# Patient Record
Sex: Female | Born: 1960
Health system: Southern US, Community
[De-identification: ages and names within clinical notes are randomized; demographics above are authoritative.]

## PROBLEM LIST (undated history)

## (undated) DIAGNOSIS — M25475 Effusion, left foot: Secondary | ICD-10-CM

## (undated) DIAGNOSIS — M1990 Unspecified osteoarthritis, unspecified site: Secondary | ICD-10-CM

## (undated) DIAGNOSIS — R0602 Shortness of breath: Secondary | ICD-10-CM

## (undated) DIAGNOSIS — R5383 Other fatigue: Secondary | ICD-10-CM

## (undated) DIAGNOSIS — I10 Essential (primary) hypertension: Secondary | ICD-10-CM

## (undated) DIAGNOSIS — M25474 Effusion, right foot: Secondary | ICD-10-CM

## (undated) DIAGNOSIS — I6529 Occlusion and stenosis of unspecified carotid artery: Secondary | ICD-10-CM

## (undated) DIAGNOSIS — M25472 Effusion, left ankle: Secondary | ICD-10-CM

## (undated) DIAGNOSIS — E039 Hypothyroidism, unspecified: Secondary | ICD-10-CM

## (undated) DIAGNOSIS — J189 Pneumonia, unspecified organism: Secondary | ICD-10-CM

## (undated) DIAGNOSIS — Z972 Presence of dental prosthetic device (complete) (partial): Secondary | ICD-10-CM

## (undated) DIAGNOSIS — M549 Dorsalgia, unspecified: Secondary | ICD-10-CM

## (undated) DIAGNOSIS — M25471 Effusion, right ankle: Secondary | ICD-10-CM

## (undated) DIAGNOSIS — M25561 Pain in right knee: Secondary | ICD-10-CM

## (undated) DIAGNOSIS — Z98811 Dental restoration status: Secondary | ICD-10-CM

## (undated) DIAGNOSIS — I639 Cerebral infarction, unspecified: Secondary | ICD-10-CM

## (undated) DIAGNOSIS — K219 Gastro-esophageal reflux disease without esophagitis: Secondary | ICD-10-CM

## (undated) DIAGNOSIS — M25562 Pain in left knee: Secondary | ICD-10-CM

## (undated) DIAGNOSIS — M25569 Pain in unspecified knee: Secondary | ICD-10-CM

## (undated) DIAGNOSIS — N393 Stress incontinence (female) (male): Secondary | ICD-10-CM

## (undated) HISTORY — DX: Other fatigue: R53.83

## (undated) HISTORY — DX: Essential (primary) hypertension: I10

## (undated) HISTORY — DX: Pain in unspecified knee: M25.569

## (undated) HISTORY — DX: Unspecified osteoarthritis, unspecified site: M19.90

## (undated) HISTORY — DX: Occlusion and stenosis of unspecified carotid artery: I65.29

## (undated) HISTORY — DX: Effusion, left foot: M25.475

## (undated) HISTORY — PX: CAROTID ENDARTERECTOMY: SUR193

## (undated) HISTORY — PX: ABDOMINAL HYSTERECTOMY: SHX81

## (undated) HISTORY — DX: Shortness of breath: R06.02

## (undated) HISTORY — DX: Effusion, right ankle: M25.471

## (undated) HISTORY — DX: Dorsalgia, unspecified: M54.9

## (undated) HISTORY — DX: Effusion, right foot: M25.474

## (undated) HISTORY — DX: Effusion, left ankle: M25.472

---

## 1998-05-08 ENCOUNTER — Other Ambulatory Visit: Admission: RE | Admit: 1998-05-08 | Discharge: 1998-05-08 | Payer: Self-pay | Admitting: Obstetrics and Gynecology

## 2000-03-03 ENCOUNTER — Other Ambulatory Visit: Admission: RE | Admit: 2000-03-03 | Discharge: 2000-03-03 | Payer: Self-pay | Admitting: Obstetrics and Gynecology

## 2000-07-24 ENCOUNTER — Emergency Department (HOSPITAL_COMMUNITY): Admission: EM | Admit: 2000-07-24 | Discharge: 2000-07-24 | Payer: Self-pay | Admitting: *Deleted

## 2000-10-15 ENCOUNTER — Emergency Department (HOSPITAL_COMMUNITY): Admission: EM | Admit: 2000-10-15 | Discharge: 2000-10-15 | Payer: Self-pay | Admitting: Emergency Medicine

## 2001-02-09 ENCOUNTER — Other Ambulatory Visit: Admission: RE | Admit: 2001-02-09 | Discharge: 2001-02-09 | Payer: Self-pay | Admitting: Obstetrics and Gynecology

## 2002-04-15 ENCOUNTER — Other Ambulatory Visit: Admission: RE | Admit: 2002-04-15 | Discharge: 2002-04-15 | Payer: Self-pay | Admitting: Obstetrics and Gynecology

## 2002-09-07 ENCOUNTER — Encounter (INDEPENDENT_AMBULATORY_CARE_PROVIDER_SITE_OTHER): Payer: Self-pay | Admitting: Specialist

## 2002-09-07 ENCOUNTER — Inpatient Hospital Stay (HOSPITAL_COMMUNITY): Admission: AD | Admit: 2002-09-07 | Discharge: 2002-09-10 | Payer: Self-pay | Admitting: Obstetrics and Gynecology

## 2002-09-07 HISTORY — PX: PARTIAL HYSTERECTOMY: SHX80

## 2003-12-02 ENCOUNTER — Emergency Department (HOSPITAL_COMMUNITY): Admission: EM | Admit: 2003-12-02 | Discharge: 2003-12-02 | Payer: Self-pay | Admitting: Emergency Medicine

## 2004-01-24 ENCOUNTER — Ambulatory Visit (HOSPITAL_COMMUNITY): Admission: RE | Admit: 2004-01-24 | Discharge: 2004-01-24 | Payer: Self-pay | Admitting: Family Medicine

## 2004-01-30 ENCOUNTER — Ambulatory Visit (HOSPITAL_COMMUNITY): Admission: RE | Admit: 2004-01-30 | Discharge: 2004-01-30 | Payer: Self-pay | Admitting: Family Medicine

## 2004-02-29 ENCOUNTER — Encounter (HOSPITAL_COMMUNITY): Admission: RE | Admit: 2004-02-29 | Discharge: 2004-03-01 | Payer: Self-pay | Admitting: Endocrinology

## 2004-03-01 ENCOUNTER — Other Ambulatory Visit: Admission: RE | Admit: 2004-03-01 | Discharge: 2004-03-01 | Payer: Self-pay | Admitting: Obstetrics and Gynecology

## 2004-03-26 ENCOUNTER — Ambulatory Visit (HOSPITAL_COMMUNITY): Admission: RE | Admit: 2004-03-26 | Discharge: 2004-03-26 | Payer: Self-pay | Admitting: Internal Medicine

## 2004-04-05 ENCOUNTER — Ambulatory Visit (HOSPITAL_COMMUNITY): Admission: RE | Admit: 2004-04-05 | Discharge: 2004-04-05 | Payer: Self-pay | Admitting: Internal Medicine

## 2004-08-12 ENCOUNTER — Emergency Department (HOSPITAL_COMMUNITY): Admission: EM | Admit: 2004-08-12 | Discharge: 2004-08-12 | Payer: Self-pay | Admitting: *Deleted

## 2004-11-19 ENCOUNTER — Emergency Department (HOSPITAL_COMMUNITY): Admission: EM | Admit: 2004-11-19 | Discharge: 2004-11-19 | Payer: Self-pay | Admitting: Emergency Medicine

## 2004-12-11 ENCOUNTER — Encounter (HOSPITAL_COMMUNITY): Admission: RE | Admit: 2004-12-11 | Discharge: 2004-12-12 | Payer: Self-pay | Admitting: Internal Medicine

## 2004-12-18 ENCOUNTER — Emergency Department (HOSPITAL_COMMUNITY): Admission: EM | Admit: 2004-12-18 | Discharge: 2004-12-18 | Payer: Self-pay | Admitting: Family Medicine

## 2005-12-26 ENCOUNTER — Encounter (HOSPITAL_COMMUNITY): Admission: RE | Admit: 2005-12-26 | Discharge: 2006-01-06 | Payer: Self-pay | Admitting: Endocrinology

## 2006-01-21 ENCOUNTER — Emergency Department (HOSPITAL_COMMUNITY): Admission: EM | Admit: 2006-01-21 | Discharge: 2006-01-21 | Payer: Self-pay | Admitting: Emergency Medicine

## 2006-06-20 ENCOUNTER — Ambulatory Visit (HOSPITAL_COMMUNITY): Admission: RE | Admit: 2006-06-20 | Discharge: 2006-06-20 | Payer: Self-pay | Admitting: Family Medicine

## 2006-08-20 ENCOUNTER — Emergency Department (HOSPITAL_COMMUNITY): Admission: EM | Admit: 2006-08-20 | Discharge: 2006-08-20 | Payer: Self-pay | Admitting: *Deleted

## 2006-08-28 ENCOUNTER — Emergency Department (HOSPITAL_COMMUNITY): Admission: EM | Admit: 2006-08-28 | Discharge: 2006-08-28 | Payer: Self-pay | Admitting: Emergency Medicine

## 2007-02-09 ENCOUNTER — Ambulatory Visit (HOSPITAL_COMMUNITY): Admission: RE | Admit: 2007-02-09 | Discharge: 2007-02-09 | Payer: Self-pay | Admitting: Family Medicine

## 2007-03-10 ENCOUNTER — Ambulatory Visit: Payer: Self-pay | Admitting: Gastroenterology

## 2007-03-10 LAB — CONVERTED CEMR LAB
A-1 Antitrypsin, Ser: 168 mg/dL (ref 83–200)
Angiotensin 1 Converting Enzyme: 21 units/L (ref 9–67)
Anti Nuclear Antibody(ANA): NEGATIVE
Ceruloplasmin: 43 mg/dL (ref 21–63)
Ferritin: 38.9 ng/mL (ref 10.0–291.0)
HCV Ab: NEGATIVE
Hep B S Ab: NEGATIVE
Hepatitis B Surface Ag: NEGATIVE
INR: 0.9 (ref 0.8–1.0)
Iron: 52 ug/dL (ref 42–145)
Prothrombin Time: 11.1 s (ref 10.9–13.3)
Saturation Ratios: 15.2 % — ABNORMAL LOW (ref 20.0–50.0)
Transferrin: 244.8 mg/dL (ref >=212.0)

## 2007-04-29 ENCOUNTER — Ambulatory Visit: Payer: Self-pay | Admitting: Orthopedic Surgery

## 2007-04-29 DIAGNOSIS — IMO0002 Reserved for concepts with insufficient information to code with codable children: Secondary | ICD-10-CM | POA: Insufficient documentation

## 2007-04-29 DIAGNOSIS — M25569 Pain in unspecified knee: Secondary | ICD-10-CM | POA: Insufficient documentation

## 2007-05-01 ENCOUNTER — Telehealth: Payer: Self-pay | Admitting: Orthopedic Surgery

## 2007-05-05 ENCOUNTER — Ambulatory Visit (HOSPITAL_COMMUNITY): Admission: RE | Admit: 2007-05-05 | Discharge: 2007-05-05 | Payer: Self-pay | Admitting: Orthopedic Surgery

## 2007-05-13 ENCOUNTER — Ambulatory Visit: Payer: Self-pay | Admitting: Orthopedic Surgery

## 2007-07-01 ENCOUNTER — Ambulatory Visit: Payer: Self-pay | Admitting: Orthopedic Surgery

## 2007-07-01 DIAGNOSIS — M722 Plantar fascial fibromatosis: Secondary | ICD-10-CM | POA: Insufficient documentation

## 2007-10-05 ENCOUNTER — Ambulatory Visit (HOSPITAL_COMMUNITY): Admission: RE | Admit: 2007-10-05 | Discharge: 2007-10-05 | Payer: Self-pay | Admitting: *Deleted

## 2007-10-15 ENCOUNTER — Ambulatory Visit (HOSPITAL_COMMUNITY): Admission: RE | Admit: 2007-10-15 | Discharge: 2007-10-15 | Payer: Self-pay | Admitting: *Deleted

## 2007-10-28 ENCOUNTER — Encounter: Admission: RE | Admit: 2007-10-28 | Discharge: 2007-10-28 | Payer: Self-pay | Admitting: *Deleted

## 2008-01-14 ENCOUNTER — Ambulatory Visit: Payer: Self-pay | Admitting: Orthopedic Surgery

## 2008-02-04 ENCOUNTER — Encounter: Admission: RE | Admit: 2008-02-04 | Discharge: 2008-05-04 | Payer: Self-pay | Admitting: *Deleted

## 2008-02-08 HISTORY — PX: GANGLION CYST EXCISION: SHX1691

## 2008-02-18 ENCOUNTER — Ambulatory Visit (HOSPITAL_COMMUNITY): Admission: RE | Admit: 2008-02-18 | Discharge: 2008-02-19 | Payer: Self-pay | Admitting: *Deleted

## 2008-02-18 HISTORY — PX: LAPAROSCOPIC GASTRIC BANDING: SHX1100

## 2008-03-01 ENCOUNTER — Encounter (INDEPENDENT_AMBULATORY_CARE_PROVIDER_SITE_OTHER): Payer: Self-pay | Admitting: Podiatry

## 2008-03-01 ENCOUNTER — Ambulatory Visit (HOSPITAL_COMMUNITY): Admission: RE | Admit: 2008-03-01 | Discharge: 2008-03-01 | Payer: Self-pay | Admitting: Podiatry

## 2008-05-16 ENCOUNTER — Encounter: Admission: RE | Admit: 2008-05-16 | Discharge: 2008-05-16 | Payer: Self-pay | Admitting: *Deleted

## 2008-08-31 ENCOUNTER — Encounter: Admission: RE | Admit: 2008-08-31 | Discharge: 2008-08-31 | Payer: Self-pay | Admitting: *Deleted

## 2009-02-16 ENCOUNTER — Encounter (INDEPENDENT_AMBULATORY_CARE_PROVIDER_SITE_OTHER): Payer: Self-pay | Admitting: *Deleted

## 2009-02-22 ENCOUNTER — Encounter (INDEPENDENT_AMBULATORY_CARE_PROVIDER_SITE_OTHER): Payer: Self-pay | Admitting: *Deleted

## 2009-03-22 ENCOUNTER — Encounter (INDEPENDENT_AMBULATORY_CARE_PROVIDER_SITE_OTHER): Payer: Self-pay | Admitting: *Deleted

## 2009-03-23 ENCOUNTER — Ambulatory Visit: Payer: Self-pay | Admitting: Gastroenterology

## 2009-05-19 ENCOUNTER — Telehealth: Payer: Self-pay | Admitting: Gastroenterology

## 2009-05-19 ENCOUNTER — Ambulatory Visit: Payer: Self-pay | Admitting: Gastroenterology

## 2009-05-19 ENCOUNTER — Telehealth (INDEPENDENT_AMBULATORY_CARE_PROVIDER_SITE_OTHER): Payer: Self-pay | Admitting: *Deleted

## 2009-05-22 ENCOUNTER — Encounter (INDEPENDENT_AMBULATORY_CARE_PROVIDER_SITE_OTHER): Payer: Self-pay | Admitting: *Deleted

## 2009-10-25 ENCOUNTER — Telehealth: Payer: Self-pay | Admitting: Gastroenterology

## 2009-11-13 ENCOUNTER — Encounter: Admission: RE | Admit: 2009-11-13 | Discharge: 2009-11-13 | Payer: Self-pay | Admitting: Obstetrics and Gynecology

## 2010-01-27 ENCOUNTER — Encounter: Payer: Self-pay | Admitting: Obstetrics and Gynecology

## 2010-01-28 ENCOUNTER — Encounter: Payer: Self-pay | Admitting: Internal Medicine

## 2010-01-28 ENCOUNTER — Encounter: Payer: Self-pay | Admitting: Family Medicine

## 2010-01-28 ENCOUNTER — Encounter: Payer: Self-pay | Admitting: Endocrinology

## 2010-02-08 NOTE — Progress Notes (Signed)
Summary: Schedule Colonoscopy   Phone Note Outgoing Call Call back at Home Phone [phone]   Call placed by: Harlow Mares CMA Duncan Dull),  October 25, 2009 2:41 PM Call placed to: Patient Summary of Call: Left a message on patients machine to call back, she noshowed her last previsit and so her colonoscopy was cxed. She is still due for her colonoscopy Initial call taken by: Harlow Mares CMA Duncan Dull),  October 25, 2009 2:42 PM  Follow-up for Phone Call        Left a message on the patient machine to call back and schedule a previsit and procedure with our office. A letter will be mailed to the patient.   Follow-up by: Harlow Mares CMA (AAMA),  November 07, 2009 1:24 PM

## 2010-02-08 NOTE — Letter (Signed)
Summary: [facility] No Show Letter  [facility]  [address] [place], [place] [address]   Phone: [phone]  Fax: [phone]      May 22, 2009 MRN: [id number]   Angela Abbott [address] [place], [place]  [address]      You were scheduled for an endoscopic procedure with Dr. Russella Dar on 05/19/2009 at the [facility] but you did not keep the appointment.    Your provider recommended this procedure for the benefit of your health.  It is very important that you reschedule it.  Failure to do so may be to the detriment of your health.  Please call us at [phone] and we will be happy to assist you with rescheduling.    If you were referred for this procedure by another physician/provider, we will notify him/her that you did not keep your appointment.   Sincerely,  Threasa Beards, RN  [facility]  Appended Document: [facility] No Show Letter letter mailed

## 2010-02-08 NOTE — Letter (Signed)
Summary: [facility] Instructions  [facility]  [address] [place], [place] [address]   Phone: [phone]  Fax: [phone]       Angela Abbott    September 06, 1960    MRN: [id number]        Procedure Day /Date:  Friday 04/07/2009     Arrival Time: 12:30 pm      Procedure Time: 1:30 pm     Location of Procedure:                    _ x_  [facility] (4th Floor)                        PREPARATION FOR COLONOSCOPY WITH MOVIPREP   Starting 5 days prior to your procedure Sunday 3/27 do not eat nuts, seeds, popcorn, corn, beans, peas,  salads, or any raw vegetables.  Do not take any fiber supplements (e.g. Metamucil, Citrucel, and Benefiber).  THE DAY BEFORE YOUR PROCEDURE         DATE: Thursday 3/31  1.  Drink clear liquids the entire day-NO SOLID FOOD  2.  Do not drink anything colored red or purple.  Avoid juices with pulp.  No orange juice.  3.  Drink at least 64 oz. (8 glasses) of fluid/clear liquids during the day to prevent dehydration and help the prep work efficiently.  CLEAR LIQUIDS INCLUDE: Water Jello Ice Popsicles Tea (sugar ok, no milk/cream) Powdered fruit flavored drinks Coffee (sugar ok, no milk/cream) Gatorade Juice: apple, white grape, white cranberry  Lemonade Clear bullion, consomm, broth Carbonated beverages (any kind) Strained chicken noodle soup Hard Candy                             4.  In the morning, mix first dose of MoviPrep solution:    Empty 1 Pouch A and 1 Pouch B into the disposable container    Add lukewarm drinking water to the top line of the container. Mix to dissolve    Refrigerate (mixed solution should be used within 24 hrs)  5.  Begin drinking the prep at 5:00 p.m. The MoviPrep container is divided by 4 marks.   Every 15 minutes drink the solution down to the next mark (approximately 8 oz) until the full liter is complete.   6.  Follow completed prep with 16 oz of clear liquid of your choice (Nothing  red or purple).  Continue to drink clear liquids until bedtime.  7.  Before going to bed, mix second dose of MoviPrep solution:    Empty 1 Pouch A and 1 Pouch B into the disposable container    Add lukewarm drinking water to the top line of the container. Mix to dissolve    Refrigerate  THE DAY OF YOUR PROCEDURE      DATE: Friday 4/1  Beginning at 8:30 a.m. (5 hours before procedure):         1. Every 15 minutes, drink the solution down to the next mark (approx 8 oz) until the full liter is complete.  2. Follow completed prep with 16 oz. of clear liquid of your choice.    3. You may drink clear liquids until 11:30 am (2 HOURS BEFORE PROCEDURE).   MEDICATION INSTRUCTIONS  Unless otherwise instructed, you should take regular prescription medications with a small sip of water   as early as possible the morning of  your procedure.         OTHER INSTRUCTIONS  You will need a responsible adult at least 51 years of age to accompany you and drive you home.   This person must remain in the waiting room during your procedure.  Wear loose fitting clothing that is easily removed.  Leave jewelry and other valuables at home.  However, you may wish to bring a book to read or  an iPod/MP3 player to listen to music as you wait for your procedure to start.  Remove all body piercing jewelry and leave at home.  Total time from sign-in until discharge is approximately 2-3 hours.  You should go home directly after your procedure and rest.  You can resume normal activities the  day after your procedure.  The day of your procedure you should not:   Drive   Make legal decisions   Operate machinery   Drink alcohol   Return to work  You will receive specific instructions about eating, activities and medications before you leave.    The above instructions have been reviewed and explained to me by   Ezra Sites RN  March 23, 2009 8:15 AM     I fully understand and can verbalize  these instructions _____________________________ Date _________

## 2010-02-08 NOTE — Letter (Signed)
Summary: Previsit letter  [facility]  [address] [place], [place] [address]   Phone: [phone]  Fax: [phone]       02/22/2009 MRN: [id number]  Angela Abbott [address] [place], [place]  [address]  Dear Ms. Wierman,  Welcome to the Gastroenterology Division at [facility].    You are scheduled to see a nurse for your pre-procedure visit on 03/23/2009 at 8:00AM on the 3rd floor at [facility], [address]  We ask that you try to arrive at our office 15 minutes prior to your appointment time to allow for check-in.  Your nurse visit will consist of discussing your medical and surgical history, your immediate family medical history, and your medications.    Please bring a complete list of all your medications or, if you prefer, bring the medication bottles and we will list them.  We will need to be aware of both prescribed and over the counter drugs.  We will need to know exact dosage information as well.  If you are on blood thinners (Coumadin, Plavix, Aggrenox, Ticlid, etc.) please call our office today/prior to your appointment, as we need to consult with your physician about holding your medication.   Please be prepared to read and sign documents such as consent forms, a financial agreement, and acknowledgement forms.  If necessary, and with your consent, a friend or relative is welcome to sit-in on the nurse visit with you.  Please bring your insurance card so that we may make a copy of it.  If your insurance requires a referral to see a specialist, please bring your referral form from your primary care physician.  No co-pay is required for this nurse visit.     If you cannot keep your appointment, please call [phone] to cancel or reschedule prior to your appointment date.  This allows [place] the opportunity to schedule an appointment for another patient in need of care.    Thank you for choosing [facility] for your medical needs.   We appreciate the opportunity to care for you.  Please visit [place] at our website  to learn more about our practice.                     Sincerely.                                                                                                                   The Gastroenterology Division

## 2010-02-08 NOTE — Progress Notes (Signed)
   Phone Note Outgoing Call   Call placed by: Laverna Peace RN,  May 19, 2009 3:28 PM Summary of Call: Called pt concerning missed appointment today. No answer.

## 2010-02-08 NOTE — Progress Notes (Signed)
Summary: No Show for Colon 05-19-09   Phone Note Outgoing Call   Call placed by: Alden Hipp,  May 19, 2009 1:24 PM Summary of Call: Called pt concerning missed appt today. No answer, LM. Do you want to charge patient NO SHOW fee?  Follow-up for Phone Call        placed call to pt for missed appointment. No answer.  Follow-up by: Laverna Peace RN,  May 19, 2009 3:26 PM  Additional Follow-up for Phone Call Additional follow up Details #1::        Yes Additional Follow-up by: Meryl Dare MD FACG,  May 29, 2009 4:32 PM    Additional Follow-up for Phone Call Additional follow up Details #2::    Patient BILLED Procedure No Show fee. Follow-up by: Leanor Kail [facility],  May 30, 2009 8:32 AM

## 2010-02-08 NOTE — Miscellaneous (Signed)
Summary: [facility] PV  Clinical Lists Changes  Medications: Added new medication of MOVIPREP 100 GM  SOLR (PEG-KCL-NACL-NASULF-NA ASC-C) As per prep instructions. - Signed Rx of MOVIPREP 100 GM  SOLR (PEG-KCL-NACL-NASULF-NA ASC-C) As per prep instructions.;  #1 x 0;  Signed;  Entered by: Ezra Sites RN;  Authorized by: Meryl Dare MD [facility];  Method used: Electronically to [facility]. [address] [phone]*, [address] [place], [place]  [address], Ph: [phone], Fax: [phone] Observations: Added new observation of NKA: T (03/23/2009 7:52)    Prescriptions: MOVIPREP 100 GM  SOLR (PEG-KCL-NACL-NASULF-NA ASC-C) As per prep instructions.  #1 x 0   Entered by:   Ezra Sites RN   Authorized by:   Meryl Dare MD [facility]   Signed by:   Ezra Sites RN on 03/23/2009   Method used:   Electronically to        [facility]. [address] [phone]* (retail)       [address] [place]  [address]       Ph: [phone]       Fax: [phone]   RxID:   [id number]

## 2010-02-08 NOTE — Letter (Signed)
Summary: Colonoscopy Letter  [facility]  [address] [place], [place] [address]   Phone: [phone]  Fax: [phone]      February 16, 2009 MRN: [id number]   Angela Abbott [address] [place], [place]  [address]   Dear [name],   According to your medical record, it is time for you to schedule a Colonoscopy. The American Cancer Society recommends this procedure as a method to detect early colon cancer. Patients with a family history of colon cancer, or a personal history of colon polyps or inflammatory bowel disease are at increased risk.  This letter has beeen generated based on the recommendations made at the time of your procedure. If you feel that in your particular situation this may no longer apply, please contact our office.  Please call our office at [phone] to schedule this appointment or to update your records at your earliest convenience.  Thank you for cooperating with [place] to provide you with the very best care possible.   Sincerely,  Judie Petit T. Russella Dar, M.D.  [facility] [phone]

## 2010-02-08 NOTE — Progress Notes (Signed)
   Phone Note Outgoing Call   Call placed by: Laverna Peace RN,  May 19, 2009 5:13 PM Summary of Call: Called pt concerning missed appointment.  No anwser.

## 2010-04-20 ENCOUNTER — Encounter: Payer: Self-pay | Admitting: Orthopedic Surgery

## 2010-04-24 LAB — DIFFERENTIAL
Basophils Absolute: 0 10*3/uL (ref 0.0–0.1)
Basophils Absolute: 0 10*3/uL (ref 0.0–0.1)
Basophils Relative: 0 % (ref 0–1)
Eosinophils Absolute: 0.1 10*3/uL (ref 0.0–0.7)
Eosinophils Relative: 1 % (ref 0–5)
Lymphocytes Relative: 12 % (ref 12–46)
Lymphocytes Relative: 27 % (ref 12–46)
Neutro Abs: 7.9 10*3/uL — ABNORMAL HIGH (ref 1.7–7.7)
Neutrophils Relative %: 64 % (ref 43–77)
Neutrophils Relative %: 82 % — ABNORMAL HIGH (ref 43–77)

## 2010-04-24 LAB — COMPREHENSIVE METABOLIC PANEL
ALT: 31 U/L (ref 0–35)
AST: 20 U/L (ref 0–37)
CO2: 31 mEq/L (ref 19–32)
Chloride: 102 mEq/L (ref 96–112)
Creatinine, Ser: 0.85 mg/dL (ref 0.4–1.2)
GFR calc Af Amer: >60 mL/min (ref >60)
GFR calc non Af Amer: >60 mL/min (ref >60)
Glucose, Bld: 92 mg/dL (ref 70–99)
Total Bilirubin: 0.8 mg/dL (ref 0.3–1.2)

## 2010-04-24 LAB — CBC
Hemoglobin: 13.1 g/dL (ref 12.0–15.0)
MCHC: 34.3 g/dL (ref 30.0–36.0)
MCHC: 34.9 g/dL (ref 30.0–36.0)
MCV: 87.7 fL (ref 78.0–100.0)
Platelets: 259 10*3/uL (ref 150–400)
RBC: 4.38 MIL/uL (ref 3.87–5.11)
RDW: 14.8 % (ref 11.5–15.5)
WBC: 8.8 10*3/uL (ref 4.0–10.5)

## 2010-04-24 LAB — HEMOGLOBIN AND HEMATOCRIT, BLOOD
HCT: 40.5 % (ref 36.0–46.0)
Hemoglobin: 13.9 g/dL (ref 12.0–15.0)

## 2010-05-08 ENCOUNTER — Ambulatory Visit: Payer: Self-pay | Admitting: Orthopedic Surgery

## 2010-05-22 NOTE — H&P (Signed)
NAME:  [name], [name]              ACCOUNT NO.:  [id number]   MEDICAL RECORD NO.:  [id number]          PATIENT TYPE:  AMB   LOCATION:  DAY                           FACILITY:  [facility]   PHYSICIAN:  Denny Peon. Ulice Brilliant, D.P.M.  DATE OF BIRTH:  05-16-1960   DATE OF ADMISSION:  DATE OF DISCHARGE:  LH                              HISTORY & PHYSICAL   Angela Abbott who is scheduled for surgery tomorrow.   HISTORY OF PRESENT ILLNESS:  Angela Abbott has a painful ganglionic-  appearing cyst on the dorsolateral aspect of the right foot.  This has  been present for about 6 months.  This was aspirated once previously,  and it recurred within a matter of 1 week following aspiration.  [name] relates it is getting progressively more painful in shoe gear.   PAST MEDICAL HISTORY:  Significant previously for post lap band surgery  done about 10 days ago.  She is doing very well at this point.  Her past  medical history otherwise is relatively unremarkable.  She has a history  of arthritis.  She also has a history of low thyroid and some esophageal  reflux issues for which she takes Synthroid and is on Protonix.   She has no known drug allergies.   There is a history of diabetes and heart disease in her family.   She relates that she smokes a quarter pack a day.  She drinks  occasionally.  Objectively, she has a soft tissue mass, which is  fluctuant on the dorsolateral aspect of the right foot, near the base of  the fourth and fifth metatarsal cuboid joint.   ASSESSMENT:  Ganglionic cyst, recurrent.   PLAN:  Surgical corrections will consist of a ganglionic cyst excision,  this will be done under monitored anesthesia care at [facility].  I have discussed the procedure with her.  I have discussed  the possibility of a postoperative infection.  I have called Dr.  Marcos Eke to see if previous lap band surgery done within the last 2  weeks would be a contraindication for his anesthesia and he  cannot think  of a reason.  We described the procedure to Angela Abbott.  She has read  this, apparently understood and signed.  I have discussed with her there  is even with surgery a possibility of recurrence.      Denny Peon. Ulice Brilliant, D.P.M.  Electronically Signed    CMD/MEDQ  D:  02/29/2008  T:  03/01/2008  Job:  [id number]

## 2010-05-22 NOTE — Assessment & Plan Note (Signed)
[facility]                         GASTROENTEROLOGY OFFICE NOTE   Angela Abbott, Angela Abbott                     MRN:          [id number]  DATE:03/10/2007                            DOB:          January 13, 1960    REFERRING PHYSICIAN:  Corrie Mckusick, M.D.   REASON FOR CONSULTATION:  Elevated liver function tests, epigastric pain  and an abnormal liver ultrasound.   HISTORY OF PRESENT ILLNESS:  Angela Abbott is a 50 year old white female  who was recently found to have an elevated ALT at 107 and a gamma GT at  20. The remainder of her liver tests were normal. Abdominal ultrasound  imaging performed at [facility] on February 09, 2007, showed  probable fatty infiltration of the liver and incomplete pancreatic  visualization. She relates epigastric pain and substernal burning  associated with nausea for the past month. These symptoms clearly worsen  at night. She was given a trial of Protonix for 7 to 10 days and her  symptoms substantially improved. She has no vomiting, hematemesis,  change in bowel habits, melena, hematochezia, dysphagia, or odynophagia.  She states she had a colonoscopy at our office about 14 years ago for  evaluation of irritable bowel syndrome. Her old chart is not available  at the time of this dictation. She denies any prior history of liver  disease, blood transfusions, jaundice, hepatitis, or intravenous drug  usage.   PAST MEDICAL HISTORY:  1. Irritable bowel syndrome.  2. Goiter, status post radioactive ablation.  3. Hypothyroidism.  4. Obesity.   PAST SURGICAL HISTORY:  1. Status post Cesarean section x2 in 1995 and 1998.  2. Status post hysterectomy in 2004.   CURRENT MEDICATIONS:  1. Synthroid 150 mcg daily.   ALLERGIES:  NO KNOWN DRUG ALLERGIES.   SOCIAL HISTORY:  Per the handwritten form.   REVIEW OF SYSTEMS:  Per the handwritten form.   PHYSICAL EXAMINATION:  GENERAL:  Obese white female. No acute  distress.  VITAL SIGNS:  Height 5 feet 7 inches. Weight 285.2 pounds. Blood  pressure 110/88, pulse 72 and regular.  HEENT:  Anicteric sclerae. Oropharynx clear.  CHEST:  Clear to auscultation bilaterally.  CARDIOVASCULAR:  Regular rate and rhythm. No murmurs.  ABDOMEN:  Soft, nontender, and nondistended. Normal active bowel sounds.  No palpable organomegaly, masses, or hernias. The liver span is  approximately 12 to 13 cm by percussion and scratch testing in the right  upper quadrant.   ASSESSMENT/PLAN:  1. Elevated transaminases with probable fatty infiltration of the      liver on ultrasound. I suspect this is hepatic steatosis. Will      exclude other viral and metabolic liver diseases. Obtain standard      blood work. She should also have a fasting lipid panel performed at      her primary care physician's office to evaluate for other causes of      fatty liver. The most likely cause is obesity and a long term      weight loss program supervised by her primary care physician is      recommended.  2. Reflux symptoms and epigastric pain.  Suspected GERD. Need to      exclude ulcer disease, gastritis and other disorders. Resume      Protonix 40 mg p.o. q. a.m., along with standard anti-reflux      measures. Return office visit in 4 to 6 weeks. If her symptoms have      not come under complete control, proceed with endoscopy for further      evaluation.  3. Colorectal cancer screening. Average risk. Begin screening at age      23.     Venita Lick. Russella Dar, MD, [facility]  Electronically Signed    MTS/MedQ  DD: 03/10/2007  DT: 03/10/2007  Job #: [id number]   cc:   Corrie Mckusick, M.D.

## 2010-05-22 NOTE — Op Note (Signed)
NAME:  Angela, Abbott              ACCOUNT NO.:  [id number]   MEDICAL RECORD NO.:  [id number]          PATIENT TYPE:  AMB   LOCATION:  DAY                           FACILITY:  [facility]   PHYSICIAN:  Denny Peon. Ulice Brilliant, D.P.M.  DATE OF BIRTH:  02/01/1960   DATE OF PROCEDURE:  DATE OF DISCHARGE:                               OPERATIVE REPORT   PREOPERATIVE DIAGNOSIS:  Ganglionic cyst, right foot.   POSTOPERATIVE DIAGNOSIS:  Ganglionic cyst, right foot.   PROCEDURE PERFORMED:  Excision of ganglionic cyst, right foot.   SURGEON:  Denny Peon. Ulice Brilliant, D.P.M.   ANESTHESIA:  MAC.   INDICATIONS FOR SURGERY:  A 103-month to 1-year history of a painful cyst  on the lateral aspect of her right foot.  This has been aspirated  previously which evacuated the contents only for about 3 days only to  fill back up again.  The patient relates constant discomfort and  irritation with enclosed shoe gear.  Clinically, the patient is noted to  have an enlarged ganglionic cyst on the dorsal lateral aspect of the  right foot about at the fourth-fifth met-cuboid articulation.   OPERATIVE FINDINGS:  Enlarged ganglionic cyst arising from the fourth  fifth metatarsal cuboid articulation the procedure well.   DESCRIPTION OF PROCEDURE:  Angela Abbott is brought in to the OR and placed  on the table in a supine position.  IV sedation was established.  A  block was performed in a V-shaped fashion proximal to the planned  incisional site.  A pneumatic ankle tourniquet was applied across her  right ankle.  Her foot was prepped and draped in the usual aseptic  fashion.  An Ace bandage was utilized to exsanguinate her foot.  The  tourniquet was inflated to 250 mmHg.   PROCEDURE:  Excision of ganglionic cyst, dorsal lateral aspect, right  foot.  Attention was directed to this dorsal lateral aspect of the right  foot.  A curvilinear skin incision was planned and then carried forth  with #15 blade.  The incision is deepened through  skin and subcutaneous  tissue via sharp and blunt dissection.  The ganglionic cyst was very  readily encountered.  It was then freed of all soft tissue adherence,  although during this the cyst was punctured and its contents were  expressed out which for typical, this gives thick gelatinous appearing  fluid.  The cyst was removed and traced to the fourth fifth metatarsal  cuboid articulation.  The wound is flushed with copious amounts of  irrigants.  This defect is closed with 3-0 Vicryl.  Deep fascia was then  further closed with 3-0 and 4-0 Vicryl.  Subcutaneous tissues and skin  were closed with a combination of running horizontal mattress sutures  and then the skin and subcuticular closure.  Steri-Strips were applied  across the wound.  A postoperative injection of Marcaine and Hexadrol  was dispensed.  A Betadine-soaked Adaptic dressing and a dry sterile  compressive dressing follows.  The specimen will be sent to pathology  labelled ganglionic cyst.   Angela Abbott tolerated the incision procedure well.  She is transported to  [facility] without incident.  While there, a list of written  instructions were explained to her.  A prescription for Lorcet Plus and  Phenergan is dispensed.  She will be seen in 7 days for her first postop  visit.      Denny Peon. Ulice Brilliant, D.P.M.  Electronically Signed     CMD/MEDQ  D:  03/02/2008  T:  03/02/2008  Job:  [id number]

## 2010-05-22 NOTE — Op Note (Signed)
NAME:  Angela Abbott, Angela Abbott              ACCOUNT NO.:  [id number]   MEDICAL RECORD NO.:  [id number]          PATIENT TYPE:  OIB   LOCATION:  1531                         FACILITY:  [facility]   PHYSICIAN:  Alfonse Ras, MD   DATE OF BIRTH:  1960/09/26   DATE OF PROCEDURE:  02/18/2008  DATE OF DISCHARGE:                               OPERATIVE REPORT   PREOPERATIVE DIAGNOSIS:  Medically refractory morbid obesity with a BMI  of 44.   POSTOPERATIVE DIAGNOSIS:  Medically refractory morbid obesity with a BMI  of 44, no evidence of hiatal hernia.   PROCEDURE:  Laparoscopic adjustable gastric banding with an 8 Allergan,  APS system, and placement of subcutaneous port.   ASSISTANT:  Thornton Park.  Martin   ANESTHESIA:  General.   DESCRIPTION:  The patient was taken to the operating room, after  extensive informed consent was granted, she was placed in supine  position.  Abdomen was prepped and draped in normal sterile fashion.  Using an 11 mm OptiView trocar in the left upper quadrant, peritoneal  access was obtained.  Pneumoperitoneum was obtained.  A 15 mm trocar was  placed through the falciform ligament in the right upper quadrant, and  an additional 11 mm trocar was placed in the right upper quadrant.  An  11 mm trocar was placed in the right supraumbilical paramedian position.  This allowed good visualization and pneumoperitoneum and placement of  the [facility] liver retractor.  The left lateral segment of the liver  was retracted with a Hotel manager.  The angle of His was  dissected both sharply and then bluntly using the band passer.  Area on  the lesser curve was identified and the pars flaccida was opened using  Bovie electrocautery.  The sizing balloon was placed down through the  esophagus and into the stomach and insufflated with 15 mL of air.  It  was pulled back against the hiatus, and there was no evidence of hiatal  hernia.  It was then pulled back.  After adequate  dissection was made  using a pars flaccida technique, the band passer was then placed in a  retrogastric position and brought out at the angle of His.  An APS band  was then placed and brought around in a retrogastric position without  difficulty.  It was snapped in place.  Sizing balloon placed down in the  stomach and the band moved easily.  An anterior fundoplication was  performed with interrupted 3-0 Ethibond sutures.  I put an antislip  suture and placed this well with the 3-0 Ethibond suture.  The tubing  was brought out through the 11 mm port in right upper quadrant and  attached to the port.  Nathanson liver retractor was removed after  adequate hemostasis was ensured.  Pneumoperitoneum was released.  All  ports were removed.  The port that was attached to the tubing then had a  fix atrium mesh with interrupted 2-0 Prolene sutures through the holes  in the port.  This was then placed in subcutaneous position without  difficulty.  Incisions were closed with  subcuticular 4-0  Vicryl sutures.  Steri-Strips and sterile dressings were applied.  All  incisions were injected with 0.5 Marcaine.  Steri-Strips and sterile  dressings were applied.  The patient tolerated the procedure well and  went to PACU in good condition.      Alfonse Ras, MD  Electronically Signed     KRE/MEDQ  D:  02/18/2008  T:  02/18/2008  Job:  [id number]

## 2010-05-25 NOTE — H&P (Signed)
NAME:  Angela Abbott, Angela Abbott                        ACCOUNT NO.:  [id number]   MEDICAL RECORD NO.:  [id number]                   PATIENT TYPE:  INP   LOCATION:  NA                                   FACILITY:  [facility]   PHYSICIAN:  Malva Limes, M.D.                 DATE OF BIRTH:  14-May-1960   DATE OF ADMISSION:  09/06/2002  DATE OF DISCHARGE:                                HISTORY & PHYSICAL   HISTORY OF PRESENT ILLNESS:  Angela Abbott is a 50 year old white female G3, P2-  0-1-2, who presents to [facility] for total abdominal hysterectomy  secondary to a several year history of worsening menometorrhagia and pelvic  pressure. The patient has had irregular cycles the majority of her life. She  required Clomid to get pregnant during her second pregnancy. She also was on  oral contraceptive pills to regulate her cycles and for birth control until  67, when she had to stop because of her smoking. At that point, she was put  on Depo-Provera. The patient continued on Depo-Provera until recently when  she continued to have bleeding on a regular basis. The patient underwent an  examination and her uterus was felt to be enlarged. An ultrasound was  obtained. No fibroids were identified. However, it was felt that the patient  likely had adenomyosis from the ultrasound findings. The patient has normal  thyroid functions. She had several options discussed with her. Those  included manipulating her hormone therapy, endometrial ablation and  hysterectomy. The patient expressed a strong desire to proceed with  definitive therapy and therefore, will undergo a hysterectomy today.   ALLERGIES:  CODEINE.   PAST MEDICAL HISTORY:  She has had 2 cesarean sections and a tubal ligation.  She has also had a D&C.   SOCIAL HISTORY:  The patient currently smokes 1 pack per day. She drinks  alcohol occasionally. She denies drug use.   CURRENT MEDICATIONS:  Include Depo-Provera.   FAMILY HISTORY:  Significant  for hypertension, diabetes, and Down's syndrome  in a sister.   PHYSICAL EXAMINATION:  GENERAL: The patient is an overweight white female in  no apparent distress.  HEENT: Within normal limits.  LUNGS: Clear to auscultation.  CARDIOVASCULAR: Regular rate and rhythm without murmur.  BREAST: Without mass or tenderness. There is no lymphadenopathy.  ABDOMEN: Soft, nontender, nondistended. There is a Pfannenstiel scar. There  is no organomegaly. There is no rebound or guarding.  EXTREMITIES: Within normal limits.  PELVIC: Examination reveals a 10 week size uterus, anteverted. No adnexal  masses. Cervix is nulliparous. Vagina is without discharge or lesions.    IMPRESSION:  1. Menometorrhagia.  2. Pelvic pressure.   PLAN:  Proceed with total abdominal hysterectomy.  Malva Limes, M.D.    MA/MEDQ  D:  09/06/2002  T:  09/06/2002  Job:  [id number]

## 2010-05-25 NOTE — Discharge Summary (Signed)
   NAME:  Angela Abbott, Angela Abbott                        ACCOUNT NO.:  [id number]   MEDICAL RECORD NO.:  [id number]                   PATIENT TYPE:  INP   LOCATION:  9303                                 FACILITY:  [facility]   PHYSICIAN:  Malva Limes, M.D.                 DATE OF BIRTH:  1960/12/08   DATE OF ADMISSION:  09/07/2002  DATE OF DISCHARGE:  09/10/2002                                 DISCHARGE SUMMARY   PRINCIPAL DISCHARGE DIAGNOSES:  1. Menometorrhagia.  2. Pelvic pressure.   PRINCIPAL PROCEDURES:  Total abdominal hysterectomy.   HISTORY OF PRESENT ILLNESS:  Ms. [name] is a 50 year old white female G3, P3-  0-1-2 who presented to [facility], on September 07, 2002, for a total  abdominal hysterectomy secondary to worsening menometorrhagia and pelvic  pressure.  A complete description of the events that led up to this  admission can be found on the dictated history and physical.  The patient  underwent a total abdominal hysterectomy without complications.  A complete  description of this procedure can be found in the dictated operative note.  The patient's pathology is pending at the time of this dictation.  The  patient's preoperative hemoglobin was 14.6 and post-op 11.3.   The patient's post-op course was complicated by a temperature elevation on  post-op day #1 which was felt to be secondary to atelectasis.  The patient  was encouraged to do extensive pulmonary work and this resolved.  The  patient also had difficulty with abdominal gas.  This was treated with  simethicone and a Dulcolax suppository.   At the time of discharge, the patient was eating a regular diet.  She was  ambulating without difficulty.  She was having no problems with bowel  function and the incision appeared to be healing well.  The patient was  discharged to home.  She was instructed to followup in the office in four  weeks.  She will be sent home with Tylox to take p.r.n.                     Malva Limes, M.D.    MA/MEDQ  D:  09/10/2002  T:  09/11/2002  Job:  [id number]

## 2010-05-25 NOTE — Op Note (Signed)
NAME:  Angela Abbott, Angela Abbott                        ACCOUNT NO.:  [id number]   MEDICAL RECORD NO.:  [id number]                   PATIENT TYPE:  INP   LOCATION:  9303                                 FACILITY:  [facility]   PHYSICIAN:  Malva Limes, M.D.                 DATE OF BIRTH:  1960/12/23   DATE OF PROCEDURE:  09/07/2002  DATE OF DISCHARGE:                                 OPERATIVE REPORT   PREOPERATIVE DIAGNOSES:  1. Menometrorrhagia.  2. Pelvic pressure.   POSTOPERATIVE DIAGNOSES:  1. Menometrorrhagia.  2. Pelvic pressure.   PROCEDURE:  Total abdominal hysterectomy.   SURGEON:  Malva Limes, M.D.   ASSISTANT:  Carrington Clamp, M.D.   ANESTHESIA:  General endotracheal.   ANTIBIOTICS:  Ancef 1 g.   DRAINS:  Foley to bedside drainage.   ESTIMATED BLOOD LOSS:  200 mL.   COMPLICATIONS:  None.   SPECIMENS:  Uterus and cervix sent to pathology.   FINDINGS:  The patient had a normal-appearing liver and gallbladder.  The  kidneys appeared normal bilaterally.  The appendix was not visualized.  The  patient had normal ovaries bilaterally, normal fallopian tubes with evidence  of past tubal ligation.   DESCRIPTION OF PROCEDURE:  The patient was taken to the operating room,  where a general anesthetic was administered without complications.  She was  then prepped in the usual fashion for this procedure.  A Foley catheter was  placed in her bladder.  She was then draped in the usual fashion for this  procedure.  The patient had a Pfannenstiel incision made through the  previous scar.  This was carried down to the fascia.  The fascia was entered  in the midline and extended laterally with Mayo scissors.  The rectus  muscles were dissected from the fascia with the Bovie.  Rectus muscles were  divided in the midline and taken superiorly and inferiorly.  The parietal  peritoneum was entered sharply and taken superiorly and inferiorly.  At this  point then examination of the  abdominal-pelvic contents was undertaken with  findings as noted above.  The O'Connor-O'Sullivan retractor was then placed  and the bowel packed away with three laps.  The uterus was then grasped with  two Kelly clamps.  The left round ligament was ligated with 0 Monocryl  suture, transected, and the anterior and posterior leaf of the broad  ligament dissected.  The ureter was identified.  Next the ovarian ligament,  fallopian tube were doubly clamped, cut, and ligated x2 with 0 Monocryl  suture.  The uterine vessels were then skeletonized, clamped, cut, and  ligated with 0 Monocryl suture.  A similar procedure was performed on the  opposite side.  The bladder flap was then taken down sharply.  The patient  had evidence of two previous cesarean sections with extensive scarring.  In  dissecting the dome of the bladder it was felt that  the muscle layer was  very close to the dissection site, and therefore a 3-0 chromic suture was  placed in this area in a running fashion.  The cardinal ligaments were then  serially clamped, cut, and ligated with 0 Monocryl suture.  Once the level  of the external os was reached, the vagina was clamped, cut, and a Heaney  suture placed on the left.  The remaining vagina was then circumscribed with  scissors and the specimen removed.  A Heaney stitch was then used in the  opposite angle.  The remaining vaginal cuff was then closed in interrupted  fashion using 0 Monocryl suture.  The pelvis was then copiously irrigated  and found to be hemostatic.  A few small bleeders in the peritoneum were  cauterized with the Bovie.  Next the ovaries were reapproximated to the  round ligaments to keep them out of the pelvis.  At this point hemostasis  was again checked and felt to be adequate.  The O'Connor-O'Sullivan  retractor was then removed.  The three laps were removed.  The parietal  peritoneum and rectus muscles were reapproximated in the midline using 0  Monocryl  suture.  The fascia was closed using 0 Monocryl suture in a running  fashion.  Subcuticular tissue was made hemostatic with the Bovie.  The  subcuticular tissue was closed using 2-0 gut suture because of the extensive  thickness of the subcuticular layer.  Stainless steel clips were used to  close the skin.  The patient tolerated the procedure well, and she was taken  to the recovery room in stable condition.  Instrument and lap counts correct  x2.                                               Malva Limes, M.D.    MA/MEDQ  D:  09/07/2002  T:  09/07/2002  Job:  [id number]

## 2010-10-09 ENCOUNTER — Other Ambulatory Visit (HOSPITAL_COMMUNITY): Payer: Self-pay | Admitting: Internal Medicine

## 2010-10-09 DIAGNOSIS — G44209 Tension-type headache, unspecified, not intractable: Secondary | ICD-10-CM

## 2010-10-09 DIAGNOSIS — E039 Hypothyroidism, unspecified: Secondary | ICD-10-CM

## 2010-10-11 ENCOUNTER — Ambulatory Visit (HOSPITAL_COMMUNITY): Admission: RE | Admit: 2010-10-11 | Payer: 59 | Source: Ambulatory Visit

## 2010-10-19 ENCOUNTER — Ambulatory Visit (INDEPENDENT_AMBULATORY_CARE_PROVIDER_SITE_OTHER): Payer: 59 | Admitting: Physician Assistant

## 2010-10-19 ENCOUNTER — Encounter (INDEPENDENT_AMBULATORY_CARE_PROVIDER_SITE_OTHER): Payer: 59

## 2010-10-19 ENCOUNTER — Encounter (INDEPENDENT_AMBULATORY_CARE_PROVIDER_SITE_OTHER): Payer: Self-pay

## 2010-10-19 NOTE — Patient Instructions (Signed)
Return in 6 months or sooner if necessary. 

## 2010-10-19 NOTE — Progress Notes (Signed)
  HISTORY: Angela Abbott is a 50 y.o.female who received an AP-Standard lap-band in February 2010 by Dr. Colin Benton. She has no new complaints but says she's frequently hungry. It appears that she's having good satiety after a reasonable portion of food but the hunger occurs soon after eating small snacks. She has rare regurgitation.  VITAL SIGNS: Filed Vitals:   10/19/10 1009  BP: 120/82  Pulse: 66  Temp: 97.8 F (36.6 C)  Resp: 16    PHYSICAL EXAM: Physical exam reveals a very well-appearing 50 y.o.female in no apparent distress Neurologic: Awake, alert, oriented Psych: Bright affect, conversant Respiratory: Breathing even and unlabored. No stridor or wheezing Extremities: Atraumatic, good range of motion. Skin: Warm, Dry, no rashes Musculoskeletal: Normal gait, Joints normal  ASSESMENT: 50 y.o.  female  s/p AP-Standard lap-band.   PLAN: I actually think her band is in the green zone as reasonable portions are holding her for a good period of time. We'll have her back in 6 months or sooner if necessary.

## 2010-12-03 ENCOUNTER — Other Ambulatory Visit (HOSPITAL_COMMUNITY): Payer: Self-pay | Admitting: Obstetrics and Gynecology

## 2010-12-03 DIAGNOSIS — Z139 Encounter for screening, unspecified: Secondary | ICD-10-CM

## 2010-12-11 ENCOUNTER — Ambulatory Visit (HOSPITAL_COMMUNITY)
Admission: RE | Admit: 2010-12-11 | Discharge: 2010-12-11 | Disposition: A | Discharge status: Home / Self Care | Payer: 59 | Source: Ambulatory Visit | Attending: Obstetrics and Gynecology | Admitting: Obstetrics and Gynecology

## 2010-12-11 DIAGNOSIS — Z139 Encounter for screening, unspecified: Secondary | ICD-10-CM

## 2010-12-11 DIAGNOSIS — Z1231 Encounter for screening mammogram for malignant neoplasm of breast: Secondary | ICD-10-CM | POA: Insufficient documentation

## 2011-04-08 ENCOUNTER — Encounter: Payer: Self-pay | Admitting: Gastroenterology

## 2011-04-17 ENCOUNTER — Encounter (INDEPENDENT_AMBULATORY_CARE_PROVIDER_SITE_OTHER): Payer: Self-pay | Admitting: Surgery

## 2011-04-18 ENCOUNTER — Ambulatory Visit (INDEPENDENT_AMBULATORY_CARE_PROVIDER_SITE_OTHER): Payer: 59

## 2011-04-25 ENCOUNTER — Encounter (INDEPENDENT_AMBULATORY_CARE_PROVIDER_SITE_OTHER): Payer: 59

## 2011-05-23 ENCOUNTER — Encounter (INDEPENDENT_AMBULATORY_CARE_PROVIDER_SITE_OTHER): Payer: Self-pay

## 2011-05-23 ENCOUNTER — Ambulatory Visit (INDEPENDENT_AMBULATORY_CARE_PROVIDER_SITE_OTHER): Payer: 59 | Admitting: Physician Assistant

## 2011-05-23 VITALS — BP 134/84 | HR 68 | Temp 97.6°F | Resp 12 | Ht 66.5 in | Wt 220.2 lb

## 2011-05-23 DIAGNOSIS — Z4651 Encounter for fitting and adjustment of gastric lap band: Secondary | ICD-10-CM

## 2011-05-23 NOTE — Progress Notes (Signed)
  HISTORY: Angela Abbott is a 51 y.o.female who received an AP-Standard lap-band in February 2010 by Dr. Colin Benton. She comes in with complaints of increased hunger and larger portion sizes. She attributes part of this to an increase in her physical activity. She denies persistent vomiting or regurgitation symptoms.  VITAL SIGNS: Filed Vitals:   05/23/11 1004  BP: 134/84  Pulse: 68  Temp: 97.6 F (36.4 C)  Resp: 12    PHYSICAL EXAM: Physical exam reveals a very well-appearing 50 y.o.female in no apparent distress Neurologic: Awake, alert, oriented Psych: Bright affect, conversant Respiratory: Breathing even and unlabored. No stridor or wheezing Abdomen: Soft, nontender, nondistended to palpation. Incisions well-healed. No incisional hernias. Port easily palpated. Extremities: Atraumatic, good range of motion.  ASSESMENT: 51 y.o.  female  s/p AP-Standard lap-band.   PLAN: The patient's port was accessed with a 20G Huber needle without difficulty. Clear fluid was aspirated and 0.25 mL saline was added to the port. The patient was able to swallow water without difficulty following the procedure and was instructed to take clear liquids for the next 24-48 hours and advance slowly as tolerated.

## 2011-05-23 NOTE — Patient Instructions (Signed)
Take clear liquids tonight. Thin protein shakes are ok to start tomorrow morning. Slowly advance your diet thereafter. Call us if you have persistent vomiting or regurgitation, night cough or reflux symptoms. Return as scheduled or sooner if you notice no changes in hunger/portion sizes.  

## 2012-01-09 ENCOUNTER — Telehealth (INDEPENDENT_AMBULATORY_CARE_PROVIDER_SITE_OTHER): Payer: Self-pay | Admitting: General Surgery

## 2012-01-09 NOTE — Telephone Encounter (Signed)
Pt called to report she is having problems with her lap band; now waking at night "choking" and reflux.  Went to her PCP and started on Nexium, but this has not resolved her from waking and feeling of choking.  Scheduled appt with [facility] to possibly remove fluid and evaluate.

## 2012-01-16 ENCOUNTER — Encounter (INDEPENDENT_AMBULATORY_CARE_PROVIDER_SITE_OTHER): Payer: Self-pay | Admitting: Physician Assistant

## 2012-01-16 ENCOUNTER — Ambulatory Visit (INDEPENDENT_AMBULATORY_CARE_PROVIDER_SITE_OTHER): Payer: 59 | Admitting: Physician Assistant

## 2012-01-16 VITALS — BP 130/80 | HR 72 | Temp 97.2°F | Resp 18 | Ht 67.0 in | Wt 219.6 lb

## 2012-01-16 DIAGNOSIS — Z4651 Encounter for fitting and adjustment of gastric lap band: Secondary | ICD-10-CM

## 2012-01-16 NOTE — Patient Instructions (Signed)
Return in two weeks. Focus on good food choices as well as physical activity. Return sooner if you have an increase in hunger, portion sizes or weight. Return also for difficulty swallowing, night cough, reflux.   

## 2012-01-16 NOTE — Progress Notes (Signed)
  HISTORY: Angela Abbott is a 52 y.o.female who received an AP-Standard lap-band in February 2010 by Dr. Colin Benton. She comes in with complaints of reflux, mostly nocturnal, since before Thanksgiving holiday. She said she had occasional episodes prior to this but now it's pretty much constant. She is taking Nexium daily. This has helped with daytime symptoms but she says she wakes up from sleep with a choking sensation and coughing. She has fairly significant solid dysphagia unless she chews excessively. She remembers no inciting event.  VITAL SIGNS: Filed Vitals:   01/16/12 0836  BP: 130/80  Pulse: 72  Temp: 97.2 F (36.2 C)  Resp: 18    PHYSICAL EXAM: Physical exam reveals a very well-appearing 52 y.o.female in no apparent distress Neurologic: Awake, alert, oriented Psych: Bright affect, conversant Respiratory: Breathing even and unlabored. No stridor or wheezing Abdomen: Soft, nontender, nondistended to palpation. Incisions well-healed. No incisional hernias. Port easily palpated. Extremities: Atraumatic, good range of motion.  ASSESMENT: 52 y.o.  female  s/p AP-Standard lap-band.   PLAN: The patient's port was accessed with a 20G Huber needle without difficulty. Clear fluid was aspirated and 0.5 mL saline was removed from the port. I gave her water which she was able to swallow readily. I asked her to continue her nexium and to return to see [place] in two weeks. In the meantime, I emphasized taking nutritious foods and avoiding slider foods. She voiced understanding and agreement.

## 2012-01-30 ENCOUNTER — Ambulatory Visit (INDEPENDENT_AMBULATORY_CARE_PROVIDER_SITE_OTHER): Payer: 59 | Admitting: Physician Assistant

## 2012-01-30 ENCOUNTER — Encounter (INDEPENDENT_AMBULATORY_CARE_PROVIDER_SITE_OTHER): Payer: Self-pay

## 2012-01-30 VITALS — BP 128/88 | HR 79 | Temp 96.9°F | Resp 16 | Ht 66.5 in | Wt 222.8 lb

## 2012-01-30 DIAGNOSIS — K219 Gastro-esophageal reflux disease without esophagitis: Secondary | ICD-10-CM

## 2012-01-30 DIAGNOSIS — Z4651 Encounter for fitting and adjustment of gastric lap band: Secondary | ICD-10-CM

## 2012-01-30 NOTE — Progress Notes (Signed)
  HISTORY: Angela Abbott is a 52 y.o.female who received an AP-Standard lap-band in 2009 by Dr. Colin Abbott. She was here two weeks ago with complaints of persistent GERD symptoms and night cough. 0.5 mL fluid was removed at that time and she's had some improvement but she still has episodes of reflux and night cough, although not as frequent. She has continued to take her Nexium but ran out 2 days ago.  VITAL SIGNS: Filed Vitals:   01/30/12 0900  BP: 128/88  Pulse: 79  Temp: 96.9 F (36.1 C)  Resp: 16    PHYSICAL EXAM: Physical exam reveals a very well-appearing 52 y.o.female in no apparent distress Neurologic: Awake, alert, oriented Psych: Bright affect, conversant Respiratory: Breathing even and unlabored. No stridor or wheezing Abdomen: Soft, nontender, nondistended to palpation. Incisions well-healed. No incisional hernias. Port easily palpated. Extremities: Atraumatic, good range of motion.  ASSESMENT: 52 y.o.  female  s/p AP-Standard lap-band.   PLAN: We discussed the need to get the reflux and night cough under control. The patient's port was accessed with a 20G Huber needle without difficulty. Clear fluid was aspirated and 0.25 mL saline was removed from the port. I asked her to refill and continue her nexium and to obtain an upper GI. If her symptoms completely resolve and the UGI shows an open band we'll likely leave the band as-is. If she has persistent symptoms, I'll refer her to Dr. Ezzard Abbott for possible EGD. The patient was advised to concentrate on healthy food choices and to avoid slider foods high in fats and carbohydrates. She voiced understanding and agreement of the plan.

## 2012-01-30 NOTE — Patient Instructions (Signed)
1. Refill your nexium prescription. 2. Obtain your upper GI x-ray 3. Return in February for follow-up 4. Concentrate on nutritious foods, low in fat and carbs and high protein/fiber. Watch portion sizes.

## 2012-02-07 ENCOUNTER — Ambulatory Visit
Admission: RE | Admit: 2012-02-07 | Discharge: 2012-02-07 | Disposition: A | Discharge status: Home / Self Care | Payer: 59 | Source: Ambulatory Visit | Attending: Physician Assistant | Admitting: Physician Assistant

## 2012-02-07 ENCOUNTER — Other Ambulatory Visit (INDEPENDENT_AMBULATORY_CARE_PROVIDER_SITE_OTHER): Payer: Self-pay | Admitting: Physician Assistant

## 2012-02-07 DIAGNOSIS — K219 Gastro-esophageal reflux disease without esophagitis: Secondary | ICD-10-CM

## 2012-02-07 DIAGNOSIS — Z4651 Encounter for fitting and adjustment of gastric lap band: Secondary | ICD-10-CM

## 2012-02-14 ENCOUNTER — Telehealth (INDEPENDENT_AMBULATORY_CARE_PROVIDER_SITE_OTHER): Payer: Self-pay

## 2012-02-14 NOTE — Telephone Encounter (Signed)
Pt states she has reflux after going to bed. I ask  her if she goes to bed after  eating . She stated yes because she works 3rd shift and she also takes her meds with milk at that time . I advised her to eat 2-3 hrs before going to bed and if she could take her meds earlier . I also advised her to sleep on elevated pillows. She verbalized understanding and she would give it a try. She will call if she needs to see a  MD before  Her appt with Mardelle Matte 03/05/12.

## 2012-02-14 NOTE — Telephone Encounter (Signed)
V/M for Patient to call ask for [facility]

## 2012-03-05 ENCOUNTER — Encounter (INDEPENDENT_AMBULATORY_CARE_PROVIDER_SITE_OTHER): Payer: Self-pay

## 2012-03-05 ENCOUNTER — Ambulatory Visit (INDEPENDENT_AMBULATORY_CARE_PROVIDER_SITE_OTHER): Payer: 59 | Admitting: Physician Assistant

## 2012-03-05 DIAGNOSIS — K219 Gastro-esophageal reflux disease without esophagitis: Secondary | ICD-10-CM

## 2012-03-05 NOTE — Patient Instructions (Signed)
Dr. Allene Pyo nurse or scheduler will contact you regarding endoscopy. Please continue your Nexium and avoid eating just prior to going to bed. Return if you feel worse.

## 2012-03-05 NOTE — Progress Notes (Signed)
  HISTORY: Angela Abbott is a 52 y.o.female who received an AP-Standard lap-band in 2009 by Dr. Colin Benton. She comes in with no episodes of reflux for the past five days. She's taking nexium on a daily basis but despite this still has reflux unless she abstains from eating for 5-6 hours prior to laying down. She has no difficulty with solid foods but unfortunately she's having increased hunger and portion sizes. Her upper GI, obtained in January, showed a relatively open band with no evidence of hiatal hernia or slip but it did reveal reflux. She's gained only 2.5 lbs in the past few weeks.  VITAL SIGNS: Filed Vitals:   03/05/12 0841  BP: 124/84  Pulse: 66  Resp: 16    PHYSICAL EXAM: Physical exam reveals a very well-appearing 52 y.o.female in no apparent distress Neurologic: Awake, alert, oriented Psych: Bright affect, conversant Respiratory: Breathing even and unlabored. No stridor or wheezing Extremities: Atraumatic, good range of motion. Skin: Warm, Dry, no rashes Musculoskeletal: Normal gait, Joints normal  ASSESMENT: 52 y.o.  female  s/p AP-Standard lap-band.   PLAN: I believe she'd benefit from an endoscopic evaluation. Ulcers are certainly part of the differential. As such I've asked her to continue her nexium and not eating prior to laying down. I'm referring her to Dr. Ezzard Standing for evaluation of EGD. She'll return to see me afterward. She voiced understanding and agreement.

## 2012-03-06 ENCOUNTER — Telehealth (INDEPENDENT_AMBULATORY_CARE_PROVIDER_SITE_OTHER): Payer: Self-pay

## 2012-03-06 NOTE — Telephone Encounter (Signed)
V/M appt per Mardelle Matte Patient may need  EDG appt3/6/14 315p

## 2012-03-12 ENCOUNTER — Ambulatory Visit (INDEPENDENT_AMBULATORY_CARE_PROVIDER_SITE_OTHER): Payer: 59 | Admitting: Surgery

## 2012-03-12 ENCOUNTER — Encounter (INDEPENDENT_AMBULATORY_CARE_PROVIDER_SITE_OTHER): Payer: Self-pay | Admitting: Surgery

## 2012-03-12 DIAGNOSIS — Z6841 Body Mass Index (BMI) 40.0 and over, adult: Secondary | ICD-10-CM

## 2012-03-12 NOTE — Progress Notes (Signed)
[facility]  Ovidio Kin, MD,  FACS [address] [place]  [address] [place], [place]    [address] Phone:  [phone] FAX:  [phone]   Re:   Angela Abbott DOB:   05/14/1960 MRN:   [id number]  Note:  We cannot find her chart in the office - so I don't have the history of her lap band fills.  ASSESSMENT AND PLAN: 1.  History of lap band, APS  Initial - BMI - 44  Dr. Colin Benton - 02/17/2008  She says that she has successfully lost >60 pounds  I think her lap band is still too tight.  Will give lap band holiday.   If symptoms resolve, start refilling the lap band.  If symptoms continue, consider endoscopy.  She'll see me or Angela Abbott back in 4 to 6 weeks.   2.  Morbid obesity 3.  Smokes about 1/2 pack of cigarettes / day  She's going to try to quit this. 4.  GERD  On nexium 5.  On thyroid replacement  HISTORY OF PRESENT ILLNESS: Chief Complaint  Patient presents with  . Bariatric Follow Up    May need EDG    Angela Abbott is a 52 y.o. (DOB: [birth year])  white  female who is a patient of GOLDING, Chancy Hurter, MD and comes to me today for evaluation of her lap band.  We cannot find her paper chart today.   Ms. Yankey had a lap band placed by Dr. Colin Benton - 02/17/2008.  She was doing well until last Oct 2013 when she got sick with a GI bug.  Since that time she has had increasing reflux.  She has been put on Nexium by Dr. Phillips Odor.  She saw Angela Abbott in 01/16/2012 and 01/30/2012 and he removed fluid from her lap band both times.  But she has continued to have reflux when laying down, night regurgitation.  She does not have trouble with her diet during the day, but she has to eat slowly.  Angela Abbott got a UGI on 02/07/2012.  This showed a 8 mm opening for the lap band.  A 13 mm pill hung up.  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  She sleeps from 10 AM to 3 PM.  She then takes a nap about 2 hours before going to work, but she eats before her nap and has reflux.  She  complains of a burning sensation.  Coffee is giving her trouble.  She does not have much trouble with her "breakfast" - which has at 3 PM.   Current Outpatient Prescriptions  Medication Sig Dispense Refill  . aspirin 81 MG tablet Take 81 mg by mouth daily.      Marland Kitchen esomeprazole (NEXIUM) 40 MG capsule Take 40 mg by mouth daily before breakfast.      . estradiol (ESTRACE) 1 MG tablet Take 1 mg by mouth daily.        Marland Kitchen levothyroxine (SYNTHROID, LEVOTHROID) 175 MCG tablet Take 175 mcg by mouth daily.       No current facility-administered medications for this visit.   Social history: Divorced x 10 years. She has 2 teenage sons. She works at [employer], night shift.  PHYSICAL EXAM: BP 138/82  Pulse 76  Temp(Src) 97.2 F (36.2 C) (Temporal)  Resp 20  Ht 5' 6.25" (1.683 m)  Wt 225 lb (102.059 kg)  BMI 36.03 kg/m2  Abdomen:  Port in RUQ.  No hernia or tenderness.  Procedure:  I accessed her lap band and removed all the fluid, which was 2.5 cc.  DATA REVIEWED: Data in Epic.   Ovidio Kin, MD, FACS Office:  [phone]

## 2012-04-29 ENCOUNTER — Encounter (INDEPENDENT_AMBULATORY_CARE_PROVIDER_SITE_OTHER): Payer: Self-pay | Admitting: Surgery

## 2012-04-29 ENCOUNTER — Ambulatory Visit (INDEPENDENT_AMBULATORY_CARE_PROVIDER_SITE_OTHER): Payer: 59 | Admitting: Surgery

## 2012-04-29 DIAGNOSIS — Z9884 Bariatric surgery status: Secondary | ICD-10-CM

## 2012-04-29 NOTE — Progress Notes (Signed)
[facility]  Ovidio Kin, MD,  FACS [address] [place]  [address] [place], [place]    [address] Phone:  [phone] FAX:  [phone]   Re:   [name] DOB:   [birth year] MRN:   [id number]  ASSESSMENT AND PLAN: 1.  History of lap band, APS  Initial - BMI - 44  Dr. Colin Benton - 02/17/2008.  Today I started refilling her lap band.  I put in 2.5 cc.  She will see Mardelle Matte back in about 8 to 10 weeks to consider adjusting the fluid in her lap band.    2.  Morbid obesity 3.  Smokes about 1/2 pack of cigarettes / day  She is cutting back on this and has gone to "vapor" cigarettes. 4.  GERD  This was actually made worse by giving her a lap band holiday.  On Nexium   5.  On thyroid replacement 6.  Right foot pain  History of ganglion on the right foot/ankle - seen by Dr. Adam Phenix in [place].  HISTORY OF PRESENT ILLNESS: Chief Complaint  Patient presents with  . Lap Band Fill    LBF    Angela Abbott is a 52 y.o. (DOB: December 18, 1960)  white  female who is a patient of GOLDING, Chancy Hurter, MD and comes to me today for follow up of her lap band.  She was originally a patient of Dr. Colin Benton.  I removed all the fluid from her lap band on her last visit 03/12/2012.  Her reflux has since resolved.  She now describes "heart burn" which is controlled with Nexium.    She also gained 7 pounds since I last saw her.  But it sounds like it is worth trying to refill her lap band and see how she does.  For now, I do not think that an upper endoscopy is necessary.  She is trying to quit smoking, but she is using the smokeless "vapor" cigarettes.  History of Lap Band: When I wrote the following history of the lap band, I did not have her chart.  The chart has since be found.  Angela Abbott had a lap band placed by Dr. Colin Benton - 02/17/2008.  She was doing well until last Oct 2013 when she got sick with a GI bug.  Since that time she has had increasing reflux.  She has been put on Nexium by  Dr. Phillips Odor.  She saw Mardelle Matte in 01/16/2012 and 01/30/2012 and he removed fluid from her lap band both times.  But she has continued to have reflux when laying down, night regurgitation.  She does not have trouble with her diet during the day, but she has to eat slowly.  Mardelle Matte got a UGI on 02/07/2012.  This showed a 8 mm opening for the lap band.  A 13 mm pill hung up.  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  She sleeps from 10 AM to 3 PM.  She then takes a nap about 2 hours before going to work, but she eats before her nap and has reflux.  She complains of a burning sensation.  Coffee is giving her trouble.  She does not have much trouble with her "breakfast" - which has at 3 PM.   Current Outpatient Prescriptions  Medication Sig Dispense Refill  . aspirin 81 MG tablet Take 81 mg by mouth daily.      Marland Kitchen esomeprazole (NEXIUM) 40 MG capsule Take 40 mg by mouth  daily before breakfast.      . estradiol (ESTRACE) 1 MG tablet Take 1 mg by mouth daily.        Marland Kitchen levothyroxine (SYNTHROID, LEVOTHROID) 175 MCG tablet Take 175 mcg by mouth daily.       No current facility-administered medications for this visit.   Social history: Divorced x 10 years. She has 2 teenage sons. She works at [employer], night shift. She knows Proofreader, another lap band patient of mine.  PHYSICAL EXAM: BP 150/90  Pulse 83  Temp(Src) 96.8 F (36 C) (Temporal)  Resp 18  Ht 5' 6.03" (1.677 m)  Wt 226 lb 12.8 oz (102.876 kg)  BMI 36.58 kg/m2  Abdomen:  Soft.  BS present.  Port in RUQ.  No hernia or tenderness.  Procedure:  I accessed her lap band and added 2.5 cc.  She drank water after the procedure and tolerated this well.  She knows to stay on liquids for 2 days.  DATA REVIEWED: Data in Epic.  Ovidio Kin, MD, FACS Office:  [phone]

## 2012-05-14 ENCOUNTER — Encounter (INDEPENDENT_AMBULATORY_CARE_PROVIDER_SITE_OTHER): Payer: Self-pay

## 2012-05-14 ENCOUNTER — Ambulatory Visit (INDEPENDENT_AMBULATORY_CARE_PROVIDER_SITE_OTHER): Payer: 59 | Admitting: Physician Assistant

## 2012-05-14 VITALS — BP 148/98 | HR 72 | Temp 97.8°F | Resp 16 | Ht 67.0 in | Wt 224.2 lb

## 2012-05-14 DIAGNOSIS — Z4651 Encounter for fitting and adjustment of gastric lap band: Secondary | ICD-10-CM

## 2012-05-14 NOTE — Patient Instructions (Signed)
Take clear liquids tonight. Thin protein shakes are ok to start tomorrow morning. Slowly advance your diet thereafter. Call us if you have persistent vomiting or regurgitation, night cough or reflux symptoms. Return as scheduled or sooner if you notice no changes in hunger/portion sizes.  

## 2012-05-14 NOTE — Progress Notes (Signed)
  HISTORY: Angela Abbott is a 52 y.o.female who received an AP-Standard lap-band in February 2010 by Dr. Colin Benton. She was seen by Dr. Ezzard Standing in March for persistent reflux and all her fluid was removed at that visit. She was seen again by him at the end of April for a 2.5 mL fill. Since then her GERD has continued to improve. She's had only one episode of GERD since that last visit. She continues on her daily Nexium. She does complain about hunger and is worried about continuing to lose weight. She wants a fill today.  VITAL SIGNS: Filed Vitals:   05/14/12 0846  BP: 148/98  Pulse: 72  Temp: 97.8 F (36.6 C)  Resp: 16    PHYSICAL EXAM: Physical exam reveals a very well-appearing 52 y.o.female in no apparent distress Neurologic: Awake, alert, oriented Psych: Bright affect, conversant Respiratory: Breathing even and unlabored. No stridor or wheezing Abdomen: Soft, nontender, nondistended to palpation. Incisions well-healed. No incisional hernias. Port easily palpated. Extremities: Atraumatic, good range of motion.  ASSESMENT: 52 y.o.  female  s/p AP-Standard lap-band.   PLAN: The patient's port was accessed with a 20G Huber needle without difficulty. Clear fluid was aspirated and 1 mL saline was added to the port to give a total predicted volume of 3.5 mL. The patient was able to swallow water without difficulty following the procedure and was instructed to take clear liquids for the next 24-48 hours and advance slowly as tolerated.

## 2012-05-15 ENCOUNTER — Ambulatory Visit (INDEPENDENT_AMBULATORY_CARE_PROVIDER_SITE_OTHER): Payer: 59 | Admitting: General Surgery

## 2012-05-15 ENCOUNTER — Encounter (INDEPENDENT_AMBULATORY_CARE_PROVIDER_SITE_OTHER): Payer: Self-pay | Admitting: General Surgery

## 2012-05-15 DIAGNOSIS — Z9884 Bariatric surgery status: Secondary | ICD-10-CM

## 2012-05-15 NOTE — Progress Notes (Signed)
Chief complaint: Vomiting or regurgitation post flap and filled  History: Patient is status post lap band by Dr. Ezzard Standing in 2010. She had all the fluid removed due to severe reflux in March. She had 2-1/2 cc added in April which he tolerated well and yesterday had 1 cc added to bring her to 3-1/2. Since then she has had persistent regurgitation of solid food and liquids as well. She came into the office today.  Exam: BP 132/86  Pulse 68  Temp(Src) 97 F (36.1 C) (Temporal)  Resp 18  Ht 5' 6.25" (1.683 m)  Wt 220 lb (99.791 kg)  BMI 35.23 kg/m2 Total weight loss 62 pounds, 4 pounds since yesterday General: Does not appear ill Abdomen: Soft nontender port site looks fine  Assessment and plan: She is obviously a restricted following her fill yesterday. We removed the 1 cc that was added yesterday. She has an appointment in several weeks and would consider filling just one half cc.

## 2012-05-26 ENCOUNTER — Telehealth (INDEPENDENT_AMBULATORY_CARE_PROVIDER_SITE_OTHER): Payer: Self-pay | Admitting: General Surgery

## 2012-05-26 ENCOUNTER — Telehealth (INDEPENDENT_AMBULATORY_CARE_PROVIDER_SITE_OTHER): Payer: Self-pay

## 2012-05-26 ENCOUNTER — Telehealth (INDEPENDENT_AMBULATORY_CARE_PROVIDER_SITE_OTHER): Payer: Self-pay | Admitting: *Deleted

## 2012-05-26 NOTE — Telephone Encounter (Signed)
V/M to call Dr. Allene Pyo office ask for [facility]

## 2012-05-26 NOTE — Telephone Encounter (Signed)
Patient called to state that she is beginning to have issues with acid reflux again especially at night when she goes to lay down.  Patient states she has had this before with her lap band and Dr. Ezzard Standing had her remove all the fluid and let it rest for 1 month patient states she has just began having the fluid refilled in the band and is having the same problems again.  Patient was offered urgent office however patient wants to get Dr. Allene Pyo advice as to what is causing this and would really like to see him.

## 2012-05-26 NOTE — Telephone Encounter (Signed)
Patient states she is having reflux. No n/v voiced . S Offered for her to come in to see Urgent office MD . She refused stating she would wait to see Angela Heater PA on 06/18/12. Advised her to call if her symptoms got worse so she could see another MD. Patient verbalized understanding.

## 2012-05-26 NOTE — Telephone Encounter (Signed)
Call patient back.  

## 2012-06-18 ENCOUNTER — Encounter (INDEPENDENT_AMBULATORY_CARE_PROVIDER_SITE_OTHER): Payer: Self-pay

## 2012-06-18 ENCOUNTER — Ambulatory Visit (INDEPENDENT_AMBULATORY_CARE_PROVIDER_SITE_OTHER): Payer: 59 | Admitting: Physician Assistant

## 2012-06-18 VITALS — BP 132/78 | HR 76 | Temp 97.2°F | Resp 16 | Ht 66.25 in | Wt 218.8 lb

## 2012-06-18 DIAGNOSIS — Z4651 Encounter for fitting and adjustment of gastric lap band: Secondary | ICD-10-CM

## 2012-06-18 NOTE — Progress Notes (Signed)
  HISTORY: MARLEEN MORET is a 52 y.o.female who received an AP-Standard lap-band in February 2010 by Dr. Colin Benton. She comes in with 1 lb weight loss since her last visit. I did a fill in early May but unfortunately she had dysphagia immediately following the fill. Dr. Johna Sheriff removed 1 mL the next day and since then she's been fine. She does complain of hunger but no further GERD symptoms.  VITAL SIGNS: Filed Vitals:   06/18/12 0846  BP: 132/78  Pulse: 76  Temp: 97.2 F (36.2 C)  Resp: 16    PHYSICAL EXAM: Physical exam reveals a very well-appearing 52 y.o.female in no apparent distress Neurologic: Awake, alert, oriented Psych: Bright affect, conversant Respiratory: Breathing even and unlabored. No stridor or wheezing Abdomen: Soft, nontender, nondistended to palpation. Incisions well-healed. No incisional hernias. Port easily palpated. Extremities: Atraumatic, good range of motion.  ASSESMENT: 52 y.o.  female  s/p AP-Standard lap-band.   PLAN: The patient's port was accessed with a 20G Huber needle without difficulty. Clear fluid was aspirated and 0.5 mL saline was added to the port to give a total predicted volume of 3 mL. The patient was able to swallow water without difficulty following the procedure and was instructed to take clear liquids for the next 24-48 hours and advance slowly as tolerated.

## 2012-06-18 NOTE — Patient Instructions (Signed)
Take clear liquids tonight. Thin protein shakes are ok to start tomorrow morning. Slowly advance your diet thereafter. Call us if you have persistent vomiting or regurgitation, night cough or reflux symptoms. Return as scheduled or sooner if you notice no changes in hunger/portion sizes.  

## 2012-09-24 ENCOUNTER — Encounter (INDEPENDENT_AMBULATORY_CARE_PROVIDER_SITE_OTHER): Payer: Self-pay

## 2012-09-24 ENCOUNTER — Ambulatory Visit (INDEPENDENT_AMBULATORY_CARE_PROVIDER_SITE_OTHER): Payer: 59 | Admitting: Physician Assistant

## 2012-09-24 DIAGNOSIS — K219 Gastro-esophageal reflux disease without esophagitis: Secondary | ICD-10-CM

## 2012-09-24 DIAGNOSIS — Z9884 Bariatric surgery status: Secondary | ICD-10-CM

## 2012-09-24 NOTE — Patient Instructions (Signed)
Follow-up with Dr. Ezzard Standing for upper endoscopy. You will be contacted for an appointment. Call the office if you haven't heard anything from [place] in one week.

## 2012-09-24 NOTE — Progress Notes (Addendum)
  HISTORY: Angela Abbott is a 52 y.o.female who received an AP-Standard lap-band in February 2010 by Dr. Colin Benton. She comes in today with continued nocturnal reflux. She is taking Nexium daily and is taking an over-the-counter anti-reflux medication that she cannot recall the name of. She has lost about 6 pounds in the past 3 months. She does not want any fluid removed from the band but she does want to address the reflux. She had an upper GI study done last year which indicated reflux but no obstruction. She has 3 mL of fluid in her band as of today. She does not complaining of any obstructive symptoms other than reflux. She is able to swallow fine.  VITAL SIGNS: Filed Vitals:   09/24/12 0842  BP: 130/80  Pulse: 74  Resp: 16    PHYSICAL EXAM: Physical exam reveals a very well-appearing 52 y.o.female in no apparent distress Neurologic: Awake, alert, oriented Psych: Bright affect, conversant Respiratory: Breathing even and unlabored. No stridor or wheezing Extremities: Atraumatic, good range of motion. Skin: Warm, Dry, no rashes Musculoskeletal: Normal gait, Joints normal  ASSESMENT: 52 y.o.  female  s/p AP-Standard lap-band.   PLAN: We discussed the sequelae of persistent reflux including possible pneumonia and Barrett's esophagus. She still does not want any fluid removed at this point. I believe it is in her best interest to have an upper endoscopy. As such, I will refer her to Dr. Ezzard Standing for EGD. She voiced understanding and agreement.  Dr. Ezzard Standing saw the patient today in clinic. He is of the opinion that a small amount of fluid needs to be removed and the patient agreed. As such patient's port was accessed and 0.5 mL was removed. This gives her a total volume of 2.5 mL. We will have her return in 4-6 weeks. If her symptoms do not improve she will then undergo endoscopy per his recommendation.

## 2012-10-22 ENCOUNTER — Encounter (INDEPENDENT_AMBULATORY_CARE_PROVIDER_SITE_OTHER): Payer: 59

## 2012-11-05 ENCOUNTER — Encounter (INDEPENDENT_AMBULATORY_CARE_PROVIDER_SITE_OTHER): Payer: 59

## 2012-11-19 ENCOUNTER — Other Ambulatory Visit (INDEPENDENT_AMBULATORY_CARE_PROVIDER_SITE_OTHER): Payer: Self-pay | Admitting: Surgery

## 2012-11-19 ENCOUNTER — Ambulatory Visit (INDEPENDENT_AMBULATORY_CARE_PROVIDER_SITE_OTHER): Payer: 59 | Admitting: Physician Assistant

## 2012-11-19 ENCOUNTER — Encounter (INDEPENDENT_AMBULATORY_CARE_PROVIDER_SITE_OTHER): Payer: Self-pay

## 2012-11-19 VITALS — BP 120/80 | HR 68 | Resp 16 | Ht 67.0 in | Wt 217.4 lb

## 2012-11-19 DIAGNOSIS — Z4651 Encounter for fitting and adjustment of gastric lap band: Secondary | ICD-10-CM

## 2012-11-19 NOTE — Progress Notes (Addendum)
  HISTORY: Angela Abbott is a 52 y.o.female who received an AP-Standard lap-band in February 2010 by Dr. Earle. She was last seen in September when I removed 0.5 mL for reflux. Unfortunately since then she's still having GERD symptoms daily with occasional delayed regurgitation of food.  VITAL SIGNS: Filed Vitals:   11/19/12 0917  BP: 120/80  Pulse: 68  Resp: 16    PHYSICAL EXAM: Physical exam reveals a very well-appearing 52 y.o.female in no apparent distress Neurologic: Awake, alert, oriented Psych: Bright affect, conversant Respiratory: Breathing even and unlabored. No stridor or wheezing Abdomen: Soft, nontender, nondistended to palpation. Incisions well-healed. No incisional hernias. Port easily palpated. Extremities: Atraumatic, good range of motion.  ASSESMENT: 52 y.o.  female  s/p AP-Standard lap-band.   PLAN: The patient's port was accessed with a 20G Huber needle without difficulty. Clear fluid was aspirated and 3 mL saline was removed from the port to give a total predicted volume of 0 mL. The patient was advised to concentrate on healthy food choices and to avoid slider foods high in fats and carbohydrates. I've referred her to Dr. Newman for EGD. He saw her during her last appointment here and recommended removal of some fluid to ameliorate her symptoms.  [I did not see the patient, but talked to  about the patient.  Her main symptoms seem to be reflux.  He has given her a lap band holiday.  He thought an upper endo would be appropriate.  She had a negative UGI on 02/07/2012.  We gave her one lap band holiday in March 2014.  So this is the second go around for a lap band holiday this year.  DN 11/19/2012] 

## 2012-11-19 NOTE — Patient Instructions (Signed)
Focus on good food choices as well as physical activity. Return sooner if you have an increase in hunger, portion sizes or weight. Return also for difficulty swallowing, night cough, reflux.   

## 2012-11-25 ENCOUNTER — Encounter (INDEPENDENT_AMBULATORY_CARE_PROVIDER_SITE_OTHER): Payer: 59 | Admitting: Surgery

## 2012-11-26 ENCOUNTER — Encounter (HOSPITAL_COMMUNITY): Payer: Self-pay

## 2012-11-26 ENCOUNTER — Encounter (HOSPITAL_COMMUNITY)
Admission: RE | Disposition: A | Discharge status: Home / Self Care | Payer: Self-pay | Source: Ambulatory Visit | Attending: Surgery

## 2012-11-26 ENCOUNTER — Ambulatory Visit (HOSPITAL_COMMUNITY)
Admission: RE | Admit: 2012-11-26 | Discharge: 2012-11-26 | Disposition: A | Discharge status: Home / Self Care | Payer: 59 | Source: Ambulatory Visit | Attending: Surgery | Admitting: Surgery

## 2012-11-26 DIAGNOSIS — K2100 Gastro-esophageal reflux disease with esophagitis, without bleeding: Secondary | ICD-10-CM

## 2012-11-26 DIAGNOSIS — K219 Gastro-esophageal reflux disease without esophagitis: Secondary | ICD-10-CM | POA: Insufficient documentation

## 2012-11-26 DIAGNOSIS — Z9884 Bariatric surgery status: Secondary | ICD-10-CM | POA: Insufficient documentation

## 2012-11-26 HISTORY — PX: ESOPHAGOGASTRODUODENOSCOPY: SHX5428

## 2012-11-26 SURGERY — EGD (ESOPHAGOGASTRODUODENOSCOPY)
Anesthesia: Moderate Sedation

## 2012-11-26 MED ORDER — MIDAZOLAM HCL 10 MG/2ML IJ SOLN
INTRAMUSCULAR | Status: AC
  Filled 2012-11-26: qty 2

## 2012-11-26 MED ORDER — SODIUM CHLORIDE 0.9 % IV SOLN
INTRAVENOUS | Status: DC
  Administered 2012-11-26: 500 mL via INTRAVENOUS

## 2012-11-26 MED ORDER — BUTAMBEN-TETRACAINE-BENZOCAINE 2-2-14 % EX AERO
INHALATION_SPRAY | CUTANEOUS | Status: DC | PRN
  Administered 2012-11-26: 3 via TOPICAL

## 2012-11-26 MED ORDER — FENTANYL CITRATE 0.05 MG/ML IJ SOLN
INTRAMUSCULAR | Status: AC
  Filled 2012-11-26: qty 2

## 2012-11-26 MED ORDER — DIPHENHYDRAMINE HCL 50 MG/ML IJ SOLN
INTRAMUSCULAR | Status: AC
  Filled 2012-11-26: qty 1

## 2012-11-26 MED ORDER — FENTANYL CITRATE 0.05 MG/ML IJ SOLN
INTRAMUSCULAR | Status: DC | PRN
  Administered 2012-11-26: 25 ug via INTRAVENOUS
  Administered 2012-11-26: 50 ug via INTRAVENOUS

## 2012-11-26 MED ORDER — MIDAZOLAM HCL 10 MG/2ML IJ SOLN
INTRAMUSCULAR | Status: DC | PRN
  Administered 2012-11-26 (×3): 2.5 mg via INTRAVENOUS

## 2012-11-26 NOTE — Interval H&P Note (Signed)
History and Physical Interval Note:  11/26/2012 8:53 AM  Angela Abbott  has presented today for surgery, with the diagnosis of GERD  The various methods of treatment have been discussed with the patient and family.  Niece, Meta Hatchet, is with her.  She is still smoking 4-6 cigarettes/day.  She know that this is not good for her health or reflux.\a  After consideration of risks, benefits and other options for treatment, the patient has consented to  Procedure(s): ESOPHAGOGASTRODUODENOSCOPY (EGD) (N/A) as a surgical intervention .  The patient's history has been reviewed, patient examined, no change in status, stable for surgery.  I have reviewed the patient's chart and labs.  Questions were answered to the patient's satisfaction.     , H

## 2012-11-26 NOTE — Op Note (Signed)
11/26/2012  9:37 AM  PATIENT:  [name], 52 y.o., female, MRN: [id number]  PREOP DIAGNOSIS:  GE reflux disease  POSTOP DIAGNOSIS:   GE relfux disease, irregularity at EG junction, suture in cardia of stomach  PROCEDURE:  Esophagogastroduedonoscopy, biopsy of stomach for CLO, biopsy at GE junction  SURGEON:   Ovidio Kin, M.D.  ANESTHESIA:   Fentanyl  75 mcg   Versed 7.5 mg  INDICATIONS FOR PROCEDURE:  Angela Abbott is a 52 y.o. (DOB: [birth year])  white  female whose primary care physician is Colette Ribas, MD and comes for upper endoscopy to evaluate reflux disease post Lap Band.   The indications and risks of the endoscopy were explained to the patient.  The risks include, but are not limited to, perforation, bleeding, or injury to the bowel.  PROCEDURE:  The patient was monitored with a pulse oximetry, BP cuff, and EKG.  The patient has nasal O2 flowing during the procedure.   The back of the throat was anesthestized with Ceticaine.  A flexible Pentax endoscope was passed down the throat without difficulty.  Findings include:   Esophagus:   Normal   GE junction at:  37 cm.  She has some minimal irregularity at the GE junction - ? Barrett's.  Biopsies obtained.   Lap band:  Looks in normal location.  The pouch is a little large.  Opening of lap band at 42 cm for a 5 cm pouch.   Stomach: Normal except in cardia, I could see one of the sutures that holds the lap band in place.   Duodenum:   Nelva Bush, though pylorus a little tight.  [Photos taken and given to patient.]  PLAN:  Continue Nexium.  To take Nexium BID for a while and see how she does.  Needs to quit smoking.  Should get reports on biopsies with in one week.  Follow up in our office in about 6 weeks.  Ovidio Kin, MD, [facility] Pager: [phone] Office phone:  [phone]

## 2012-11-26 NOTE — H&P (View-Only) (Signed)
  HISTORY: Angela Abbott is a 52 y.o.female who received an AP-Standard lap-band in February 2010 by Dr. Colin Benton. She was last seen in September when I removed 0.5 mL for reflux. Unfortunately since then she's still having GERD symptoms daily with occasional delayed regurgitation of food.  VITAL SIGNS: Filed Vitals:   11/19/12 0917  BP: 120/80  Pulse: 68  Resp: 16    PHYSICAL EXAM: Physical exam reveals a very well-appearing 52 y.o.female in no apparent distress Neurologic: Awake, alert, oriented Psych: Bright affect, conversant Respiratory: Breathing even and unlabored. No stridor or wheezing Abdomen: Soft, nontender, nondistended to palpation. Incisions well-healed. No incisional hernias. Port easily palpated. Extremities: Atraumatic, good range of motion.  ASSESMENT: 52 y.o.  female  s/p AP-Standard lap-band.   PLAN: The patient's port was accessed with a 20G Huber needle without difficulty. Clear fluid was aspirated and 3 mL saline was removed from the port to give a total predicted volume of 0 mL. The patient was advised to concentrate on healthy food choices and to avoid slider foods high in fats and carbohydrates. I've referred her to Dr. Ezzard Standing for EGD. He saw her during her last appointment here and recommended removal of some fluid to ameliorate her symptoms.  [I did not see the patient, but talked to [place] about the patient.  Her main symptoms seem to be reflux.  He has given her a lap band holiday.  He thought an upper endo would be appropriate.  She had a negative UGI on 02/07/2012.  We gave her one lap band holiday in March 2014.  So this is the second go around for a lap band holiday this year.  DN 11/19/2012]

## 2012-11-27 ENCOUNTER — Encounter (HOSPITAL_COMMUNITY): Payer: Self-pay | Admitting: Surgery

## 2013-01-12 ENCOUNTER — Other Ambulatory Visit: Payer: Self-pay | Admitting: Obstetrics and Gynecology

## 2013-01-12 DIAGNOSIS — R928 Other abnormal and inconclusive findings on diagnostic imaging of breast: Secondary | ICD-10-CM

## 2013-01-14 ENCOUNTER — Encounter (INDEPENDENT_AMBULATORY_CARE_PROVIDER_SITE_OTHER): Payer: 59 | Admitting: Surgery

## 2013-02-02 ENCOUNTER — Ambulatory Visit
Admission: RE | Admit: 2013-02-02 | Discharge: 2013-02-02 | Disposition: A | Discharge status: Home / Self Care | Payer: 59 | Source: Ambulatory Visit | Attending: Obstetrics and Gynecology | Admitting: Obstetrics and Gynecology

## 2013-02-02 DIAGNOSIS — R928 Other abnormal and inconclusive findings on diagnostic imaging of breast: Secondary | ICD-10-CM

## 2013-02-10 ENCOUNTER — Ambulatory Visit (INDEPENDENT_AMBULATORY_CARE_PROVIDER_SITE_OTHER): Payer: 59 | Admitting: Surgery

## 2013-02-10 DIAGNOSIS — Z9884 Bariatric surgery status: Secondary | ICD-10-CM

## 2013-02-10 DIAGNOSIS — Z4651 Encounter for fitting and adjustment of gastric lap band: Secondary | ICD-10-CM

## 2013-02-10 NOTE — Progress Notes (Signed)
[place], MD,  [address]  [place], [place]    [place] Phone:  [phone] FAX:  [phone]   Re:   Angela Abbott DOB:   Jan 24, 1960 MRN:   [id number]  ASSESSMENT AND PLAN: 1.  History of lap band, APS (Dr. Zettie Abbott - 02/17/2008)  Initial - BMI - 44  Upper endo - 11/26/2012 - D. .  Minimal esophagitis (no Barrett's).  Lap Band in good position.  Restarted adding fluid to lap band today - I added 2.0 cc.  She will see me or Angela Abbott back in 2 months   2.  Morbid obesity 3.  Smokes about 1/2 pack of cigarettes / day  She has talked about going to "vapor" cigarettes. 4.  GERD  On Nexium  This has resolved with the lap band holiday 5.  On thyroid replacement  HISTORY OF PRESENT ILLNESS: Chief Complaint  Patient presents with  . Lap Band Fill    Angela Abbott is a 53 y.o. (DOB: 1960-02-08)  white  female who is a patient of Abbott, Angela Coder, MD and comes to me today for follow up of her lap band.  She was originally a patient of Dr. Zettie Abbott.  I did an upper endo on her 11/26/2012.  She had mild inflammation at the GE junction and a suture in her cardia I could see on endo. She has gained about 6 pounds on the lap band holiday.  She wants a fill.  Her reflux symptoms have all resolved.  I gave her a copy of her path report. She walks on the weekend with her sister, but I don't think she is exercising enough.   She is having trouble with her two teenage sons (ages 49 and 20)  History of Lap Band: Angela Abbott had a lap band placed by Dr. Zettie Abbott - 02/17/2008.  She was doing well until last Oct 2013 when she got sick with a GI bug.  Since that time she has had increasing reflux.  She has been put on Nexium by Dr. Hilma Abbott.  She saw Angela Abbott in 01/16/2012 and 01/30/2012 and he removed fluid from her lap band both times.  But she has continued to have reflux when laying down, night regurgitation.  She does not have trouble with her  diet during the day, but she has to eat slowly.  Angela Abbott got a UGI on 02/07/2012.  This showed a 8 mm opening for the lap band.  A 13 mm pill hung up.  She works night shift at [employer] - 12 MN to 8 AM.  She sleeps from 10 AM to 3 PM.     Current Outpatient Prescriptions  Medication Sig Dispense Refill  . aspirin 81 MG tablet Take 81 mg by mouth daily.      Marland Kitchen esomeprazole (NEXIUM) 40 MG capsule Take 40 mg by mouth daily before breakfast.      . estradiol (ESTRACE) 1 MG tablet Take 1 mg by mouth daily.        Marland Kitchen levothyroxine (SYNTHROID, LEVOTHROID) 175 MCG tablet Take 175 mcg by mouth daily.      . valACYclovir (VALTREX) 1000 MG tablet        No current facility-administered medications for this visit.   Social history: Divorced x 10 years. She has 2 teenage sons. She works at [employer], night shift. She knows Arts development officer, another lap band patient of mine.  PHYSICAL EXAM: BP 152/100  Pulse  74  Temp(Src) 98 F (36.7 C)  Resp 18  Ht 5' 6.5" (1.689 m)  Wt 223 lb (101.152 kg)  BMI 35.46 kg/m2  Abdomen:  Soft.  BS present.  Port in [place].  It is tilted cranial.  No hernia or tenderness.  Procedure:  I accessed her lap band.  I added 2.0 cc to the lap band for a total volume of 2.0 cc.  She tolerated water after the fill.  DATA REVIEWED: Data in Epic. I gave her path report from endo.  Angela Overall, MD, [place] Office:  [phone]

## 2013-03-11 ENCOUNTER — Encounter (INDEPENDENT_AMBULATORY_CARE_PROVIDER_SITE_OTHER): Payer: 59

## 2013-03-30 ENCOUNTER — Ambulatory Visit (INDEPENDENT_AMBULATORY_CARE_PROVIDER_SITE_OTHER): Payer: 59 | Admitting: Orthopedic Surgery

## 2013-03-30 ENCOUNTER — Encounter: Payer: Self-pay | Admitting: Orthopedic Surgery

## 2013-03-30 ENCOUNTER — Ambulatory Visit (INDEPENDENT_AMBULATORY_CARE_PROVIDER_SITE_OTHER): Payer: 59

## 2013-03-30 VITALS — BP 150/91 | Ht 67.0 in | Wt 225.0 lb

## 2013-03-30 DIAGNOSIS — M1990 Unspecified osteoarthritis, unspecified site: Secondary | ICD-10-CM

## 2013-03-30 DIAGNOSIS — M79642 Pain in left hand: Secondary | ICD-10-CM

## 2013-03-30 DIAGNOSIS — M19032 Primary osteoarthritis, left wrist: Secondary | ICD-10-CM

## 2013-03-30 DIAGNOSIS — M79609 Pain in unspecified limb: Secondary | ICD-10-CM

## 2013-03-30 DIAGNOSIS — M19039 Primary osteoarthritis, unspecified wrist: Secondary | ICD-10-CM

## 2013-03-30 DIAGNOSIS — M129 Arthropathy, unspecified: Secondary | ICD-10-CM

## 2013-03-30 MED ORDER — NABUMETONE 500 MG PO TABS: 500.0000 mg | ORAL_TABLET | Freq: Two times a day (BID) | ORAL | Status: DC

## 2013-03-30 NOTE — Patient Instructions (Signed)
Wear wrist brace x 6 weeks

## 2013-03-30 NOTE — Progress Notes (Signed)
Patient ID: Angela Abbott, female   DOB: Sep 19, 1960, 53 y.o.   MRN: [id number]  Chief Complaint  Patient presents with  . 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 She does describe dull throbbing 7/10 constant pain associated with loss of grip strength  Review of systems heartburn joint pain and swelling he does loosen were reviewed and were normal medical history as recorded  BP 150/91  Ht 5\' 7"  (1.702 m)  Wt 225 lb (102.059 kg)  BMI 35.23 kg/m2 General appearance is normal, grooming and hygiene are normal. She's oriented x3. Her mood is normal. Her ambulation is noncontributory but normal.  Her hands look normal however she is tender over the dorsum of the wrist joint with painful wrist extension but normal range of motion of the wrist. Watson test for stability normal. No muscle atrophy. The skin is normal without rashes. Radial and ulnar pulse are normal she has no sensory deficits and she has no lymphadenopathy  Her x-ray shows mild joint space narrowing of the radial scaphoid joint  Impression wrist arthritis  Plan recommend splinting for 6 weeks and arthritis medicine for 6 weeks. We discussed the fact that she is on Nexium and if she has difficulty with medication she is to stop taking it  Followup for reevaluation.

## 2013-04-08 ENCOUNTER — Encounter (INDEPENDENT_AMBULATORY_CARE_PROVIDER_SITE_OTHER): Payer: Self-pay

## 2013-04-08 ENCOUNTER — Ambulatory Visit (INDEPENDENT_AMBULATORY_CARE_PROVIDER_SITE_OTHER): Payer: 59 | Admitting: Physician Assistant

## 2013-04-08 VITALS — BP 122/80 | HR 78 | Temp 97.8°F | Ht 66.0 in | Wt 226.6 lb

## 2013-04-08 DIAGNOSIS — Z4651 Encounter for fitting and adjustment of gastric lap band: Secondary | ICD-10-CM

## 2013-04-08 NOTE — Progress Notes (Signed)
  HISTORY: Angela Abbott is a 53 y.o.female who received an AP-Standard lap-band in February 2010 by Dr. Zettie Pho. She was last seen by Dr. Lucia Gaskins who had performed an EGD which revealed some gastritis but fortunately no evidence of erosion. She has gained 3 lbs since her 2 mL fill with Dr. Lucia Gaskins in February. She has no obstructive complaints, but she will have reflux if she goes to bed within 1 hour of eating. She knows that this isn't a good idea. She does notice being able to eat more than previously but she doesn't want persistent reflux. She is going back to the gym with her sons.  VITAL SIGNS: Filed Vitals:   04/08/13 0834  BP: 122/80  Pulse: 78  Temp: 97.8 F (36.6 C)    PHYSICAL EXAM: Physical exam reveals a very well-appearing 53 y.o.female in no apparent distress Neurologic: Awake, alert, oriented Psych: Bright affect, conversant Respiratory: Breathing even and unlabored. No stridor or wheezing Abdomen: Soft, nontender, nondistended to palpation. Incisions well-healed. No incisional hernias. Port easily palpated. Extremities: Atraumatic, good range of motion.  ASSESMENT: 53 y.o.  female  s/p AP-Standard lap-band.   PLAN: The patient's port was accessed with a 20G Huber needle without difficulty. Clear fluid was aspirated and 0.5 mL saline was added to the port to give a total predicted volume of 2.5 mL. The patient was able to swallow water without difficulty following the procedure and was instructed to take clear liquids for the next 24-48 hours and advance slowly as tolerated.

## 2013-04-08 NOTE — Patient Instructions (Signed)

## 2013-05-11 ENCOUNTER — Ambulatory Visit: Payer: 59 | Admitting: Orthopedic Surgery

## 2013-05-18 ENCOUNTER — Encounter: Payer: Self-pay | Admitting: Orthopedic Surgery

## 2013-05-18 ENCOUNTER — Ambulatory Visit (INDEPENDENT_AMBULATORY_CARE_PROVIDER_SITE_OTHER): Payer: 59 | Admitting: Orthopedic Surgery

## 2013-05-18 VITALS — BP 136/85 | Ht 66.0 in | Wt 226.0 lb

## 2013-05-18 DIAGNOSIS — M5430 Sciatica, unspecified side: Secondary | ICD-10-CM

## 2013-05-18 DIAGNOSIS — M5432 Sciatica, left side: Secondary | ICD-10-CM

## 2013-05-18 DIAGNOSIS — M19039 Primary osteoarthritis, unspecified wrist: Secondary | ICD-10-CM

## 2013-05-18 DIAGNOSIS — M19032 Primary osteoarthritis, left wrist: Secondary | ICD-10-CM

## 2013-05-18 MED ORDER — PREDNISONE (PAK) 10 MG PO TABS
ORAL_TABLET | Freq: Every day | ORAL | Status: DC
Start: 2013-05-18 — End: 2013-07-07

## 2013-05-18 NOTE — Progress Notes (Signed)
Patient ID: Angela Abbott, female   DOB: 02-04-1960, 53 y.o.   MRN: [id number]  Chief Complaint  Patient presents with  . Follow-up    8 week recheck left wrist arthritis    Treated for arthritis with splinting with good result. Has no complaint of pain in lower back radiating to left knee with history of left knee arthritis. This pain definitely seems different. It is associated with back pain and recumbency.  She denies weakness or numbness or tingling below the knee. Past Medical History  Diagnosis Date  . Acid reflux     Patient had difficulty with anti-inflammatory secondary to acid reflux despite being on Prilosec stop the Relafen   BP 136/85  Ht 5\' 6"  (1.676 m)  Wt 226 lb (102.513 kg)  BMI 36.49 kg/m2 Vital signs:   General the patient is well-developed and well-nourished grooming and hygiene are normal Oriented x3 Mood and affect normal Ambulation normal  Inspection of the lumbar spine shows tenderness in the L4-L5 region with tenderness in the left gluteal area.  Bilateral lower extremity exam reveals: Full range of motion All joints are stable Motor exam is normal Skin clean dry and intact  Cardiovascular exam is normal Sensory exam normal  MRI 2006 shows multilevel degenerative disc disease  Encounter Diagnoses  Name Primary?  Marland Kitchen Arthritis of wrist, left, degenerative Yes  . Sciatica of left side    Prednisone Dosepak for 12 days No orders of the defined types were placed in this encounter.

## 2013-06-10 ENCOUNTER — Ambulatory Visit (INDEPENDENT_AMBULATORY_CARE_PROVIDER_SITE_OTHER): Payer: 59 | Admitting: Physician Assistant

## 2013-06-10 ENCOUNTER — Encounter (INDEPENDENT_AMBULATORY_CARE_PROVIDER_SITE_OTHER): Payer: Self-pay

## 2013-06-10 VITALS — BP 146/90 | HR 72 | Temp 98.6°F | Resp 14 | Ht 66.5 in | Wt 223.4 lb

## 2013-06-10 DIAGNOSIS — Z4651 Encounter for fitting and adjustment of gastric lap band: Secondary | ICD-10-CM

## 2013-06-10 NOTE — Progress Notes (Signed)
  HISTORY: Angela Abbott is a 53 y.o.female who received an AP-Standard lap-band in February 2010 by Dr. Zettie Pho. She comes in with 3 lbs weight loss in 2 months. She has vast improvement in her GERD symptoms. She is taking her medications in the afternoon now rather than in the morning (she works third shift) and is being very careful about not eating within 4 hours of bed. She is watching her portions but she says they could be smaller. No regurgitation symptoms.Marland Kitchen  VITAL SIGNS: Filed Vitals:   06/10/13 0839  BP: 146/90  Pulse: 72  Temp: 98.6 F (37 C)  Resp: 14    PHYSICAL EXAM: Physical exam reveals a very well-appearing 53 y.o.female in no apparent distress Neurologic: Awake, alert, oriented Psych: Bright affect, conversant Respiratory: Breathing even and unlabored. No stridor or wheezing Abdomen: Soft, nontender, nondistended to palpation. Incisions well-healed. No incisional hernias. Port easily palpated. Extremities: Atraumatic, good range of motion.  ASSESMENT: 53 y.o.  female  s/p AP-Standard lap-band.   PLAN: The patient's port was accessed with a 20G Huber needle without difficulty. Clear fluid was aspirated and 0.25 mL saline was added to the port to give a total predicted volume of 2.75 mL. She began having issues at 3 mL. The patient was able to swallow water without difficulty following the procedure and was instructed to take clear liquids for the next 24-48 hours and advance slowly as tolerated.

## 2013-06-10 NOTE — Patient Instructions (Signed)

## 2013-07-07 ENCOUNTER — Emergency Department (HOSPITAL_COMMUNITY): Payer: 59

## 2013-07-07 ENCOUNTER — Emergency Department (HOSPITAL_COMMUNITY)
Admission: EM | Admit: 2013-07-07 | Discharge: 2013-07-07 | Disposition: A | Discharge status: Home / Self Care | Payer: 59 | Attending: Emergency Medicine | Admitting: Emergency Medicine

## 2013-07-07 ENCOUNTER — Encounter (HOSPITAL_COMMUNITY): Payer: Self-pay | Admitting: Emergency Medicine

## 2013-07-07 DIAGNOSIS — Z7982 Long term (current) use of aspirin: Secondary | ICD-10-CM | POA: Insufficient documentation

## 2013-07-07 DIAGNOSIS — Z79899 Other long term (current) drug therapy: Secondary | ICD-10-CM | POA: Insufficient documentation

## 2013-07-07 DIAGNOSIS — R197 Diarrhea, unspecified: Secondary | ICD-10-CM | POA: Insufficient documentation

## 2013-07-07 DIAGNOSIS — F17200 Nicotine dependence, unspecified, uncomplicated: Secondary | ICD-10-CM | POA: Insufficient documentation

## 2013-07-07 DIAGNOSIS — K219 Gastro-esophageal reflux disease without esophagitis: Secondary | ICD-10-CM | POA: Insufficient documentation

## 2013-07-07 DIAGNOSIS — R112 Nausea with vomiting, unspecified: Secondary | ICD-10-CM | POA: Insufficient documentation

## 2013-07-07 DIAGNOSIS — R6883 Chills (without fever): Secondary | ICD-10-CM | POA: Insufficient documentation

## 2013-07-07 DIAGNOSIS — Z791 Long term (current) use of non-steroidal anti-inflammatories (NSAID): Secondary | ICD-10-CM | POA: Insufficient documentation

## 2013-07-07 LAB — COMPREHENSIVE METABOLIC PANEL
ALBUMIN: 4.1 g/dL (ref 3.5–5.2)
ALK PHOS: 86 U/L (ref 39–117)
ALT: 22 U/L (ref 0–35)
ANION GAP: 17 — AB (ref 5–15)
AST: 18 U/L (ref 0–37)
BUN: 20 mg/dL (ref 6–23)
CHLORIDE: 100 meq/L (ref 96–112)
CO2: 23 mEq/L (ref 19–32)
Calcium: 9.9 mg/dL (ref 8.4–10.5)
Creatinine, Ser: 0.85 mg/dL (ref 0.50–1.10)
GFR calc Af Amer: 88 mL/min — ABNORMAL LOW (ref >90)
GFR calc non Af Amer: 76 mL/min — ABNORMAL LOW (ref >90)
Glucose, Bld: 126 mg/dL — ABNORMAL HIGH (ref 70–99)
POTASSIUM: 4.1 meq/L (ref 3.7–5.3)
Sodium: 140 mEq/L (ref 137–147)
Total Bilirubin: 0.5 mg/dL (ref 0.3–1.2)
Total Protein: 8.5 g/dL — ABNORMAL HIGH (ref 6.0–8.3)

## 2013-07-07 LAB — CBC WITH DIFFERENTIAL/PLATELET
BASOS PCT: 0 % (ref 0–1)
Basophils Absolute: 0 10*3/uL (ref 0.0–0.1)
Eosinophils Absolute: 0 10*3/uL (ref 0.0–0.7)
Eosinophils Relative: 0 % (ref 0–5)
HEMATOCRIT: 49.4 % — AB (ref 36.0–46.0)
HEMOGLOBIN: 16.9 g/dL — AB (ref 12.0–15.0)
LYMPHS PCT: 5 % — AB (ref 12–46)
Lymphs Abs: 0.4 10*3/uL — ABNORMAL LOW (ref 0.7–4.0)
MCH: 29.2 pg (ref 26.0–34.0)
MCHC: 34.2 g/dL (ref 30.0–36.0)
MCV: 85.3 fL (ref 78.0–100.0)
MONO ABS: 0.7 10*3/uL (ref 0.1–1.0)
MONOS PCT: 9 % (ref 3–12)
NEUTROS ABS: 6.5 10*3/uL (ref 1.7–7.7)
Neutrophils Relative %: 86 % — ABNORMAL HIGH (ref 43–77)
Platelets: 223 10*3/uL (ref 150–400)
RBC: 5.79 MIL/uL — AB (ref 3.87–5.11)
RDW: 13.5 % (ref 11.5–15.5)
WBC: 7.6 10*3/uL (ref 4.0–10.5)

## 2013-07-07 LAB — URINALYSIS, ROUTINE W REFLEX MICROSCOPIC
BILIRUBIN URINE: NEGATIVE
Glucose, UA: NEGATIVE mg/dL
KETONES UR: NEGATIVE mg/dL
Leukocytes, UA: NEGATIVE
Nitrite: NEGATIVE
PH: 5 (ref 5.0–8.0)
Protein, ur: NEGATIVE mg/dL
Specific Gravity, Urine: <1.005 — ABNORMAL LOW (ref 1.005–1.030)
Urobilinogen, UA: 0.2 mg/dL (ref 0.0–1.0)

## 2013-07-07 LAB — URINE MICROSCOPIC-ADD ON

## 2013-07-07 LAB — CLOSTRIDIUM DIFFICILE BY PCR: CDIFFPCR: NEGATIVE

## 2013-07-07 LAB — LIPASE, BLOOD: Lipase: 27 U/L (ref 11–59)

## 2013-07-07 MED ORDER — IOHEXOL 300 MG/ML  SOLN
100.0000 mL | Freq: Once | INTRAMUSCULAR | Status: AC | PRN
  Administered 2013-07-07: 100 mL via INTRAVENOUS

## 2013-07-07 MED ORDER — ONDANSETRON HCL 4 MG/2ML IJ SOLN
4.0000 mg | INTRAMUSCULAR | Status: DC | PRN
  Administered 2013-07-07: 4 mg via INTRAVENOUS
  Filled 2013-07-07: qty 2

## 2013-07-07 MED ORDER — SODIUM CHLORIDE 0.9 % IV SOLN: INTRAVENOUS | Status: DC

## 2013-07-07 MED ORDER — IOHEXOL 300 MG/ML  SOLN
50.0000 mL | Freq: Once | INTRAMUSCULAR | Status: AC | PRN
  Administered 2013-07-07: 50 mL via ORAL

## 2013-07-07 MED ORDER — DICYCLOMINE HCL 10 MG/ML IM SOLN
20.0000 mg | Freq: Once | INTRAMUSCULAR | Status: AC
  Administered 2013-07-07: 20 mg via INTRAMUSCULAR
  Filled 2013-07-07: qty 2

## 2013-07-07 MED ORDER — ONDANSETRON HCL 4 MG PO TABS: 4.0000 mg | ORAL_TABLET | Freq: Three times a day (TID) | ORAL | Status: DC | PRN

## 2013-07-07 MED ORDER — DICYCLOMINE HCL 20 MG PO TABS: 20.0000 mg | ORAL_TABLET | Freq: Four times a day (QID) | ORAL | Status: DC | PRN

## 2013-07-07 MED ORDER — SODIUM CHLORIDE 0.9 % IV BOLUS (SEPSIS)
500.0000 mL | Freq: Once | INTRAVENOUS | Status: AC
  Administered 2013-07-07: 500 mL via INTRAVENOUS

## 2013-07-07 MED ORDER — FAMOTIDINE IN NACL 20-0.9 MG/50ML-% IV SOLN
20.0000 mg | Freq: Once | INTRAVENOUS | Status: AC
  Administered 2013-07-07: 20 mg via INTRAVENOUS
  Filled 2013-07-07: qty 50

## 2013-07-07 NOTE — ED Provider Notes (Signed)
CSN: [id number]     Arrival date & time 07/07/13  1341 History   First MD Initiated Contact with Patient 07/07/13 1440     Chief Complaint  Patient presents with  . Abdominal Pain      HPI Pt was seen at 1440. Per pt, c/o gradual onset and persistence of multiple intermittent episodes of N/V/D that began last night.  Describes the stools as "watery." Has been associated with generalized "cramping" abd pain. Denies CP/SOB, no back pain, no fevers, no black or blood in stools or emesis.     Past Medical History  Diagnosis Date  . Acid reflux    Past Surgical History  Procedure Laterality Date  . Cesarean section  95 & 98  . Partial hysterectomy    . Laparoscopic gastric banding  02/18/08  . Ganglion cyst excision      right ankle  . Laparoscopic gastric banding  02/2008  . Esophagogastroduodenoscopy N/A 11/26/2012    Procedure: ESOPHAGOGASTRODUODENOSCOPY (EGD);  Surgeon: Shann Medal, MD;  Location: [facility];  Service: General;  Laterality: N/A;   Family History  Problem Relation Age of Onset  . Diabetes      Family history   . Heart defect      family history   . Arthritis      family history    History  Substance Use Topics  . Smoking status: Current Every Day Smoker -- 0.50 packs/day    Types: Cigarettes  . Smokeless tobacco: Not on file  . Alcohol Use: Yes     Comment: occ    Review of Systems ROS: Statement: All systems negative except as marked or noted in the HPI; Constitutional: Negative for fever and +chills. ; ; Eyes: Negative for eye pain, redness and discharge. ; ; ENMT: Negative for ear pain, hoarseness, nasal congestion, sinus pressure and sore throat. ; ; Cardiovascular: Negative for chest pain, palpitations, diaphoresis, dyspnea and peripheral edema. ; ; Respiratory: Negative for cough, wheezing and stridor. ; ; Gastrointestinal: +N/V/D, abd pain. Negative for blood in stool, hematemesis, jaundice and rectal bleeding. . ; ; Genitourinary: Negative for  dysuria, flank pain and hematuria. ; ; Musculoskeletal: Negative for back pain and neck pain. Negative for swelling and trauma.; ; Skin: Negative for pruritus, rash, abrasions, blisters, bruising and skin lesion.; ; Neuro: Negative for headache, lightheadedness and neck stiffness. Negative for weakness, altered level of consciousness , altered mental status, extremity weakness, paresthesias, involuntary movement, seizure and syncope.      Allergies  Bee venom and Relafen  Home Medications   Prior to Admission medications   Medication Sig Start Date End Date Taking? Authorizing Provider  aspirin 81 MG tablet Take 81 mg by mouth at bedtime.    Yes Historical Provider, MD  Biotin 1000 MCG tablet Take 1,000 mcg by mouth at bedtime.    Yes Historical Provider, MD  celecoxib (CELEBREX) 200 MG capsule Take 200 mg by mouth at bedtime.   Yes Historical Provider, MD  esomeprazole (NEXIUM) 40 MG capsule Take 40 mg by mouth at bedtime.    Yes Historical Provider, MD  estradiol (ESTRACE) 1 MG tablet Take 1 mg by mouth at bedtime.    Yes Historical Provider, MD  levothyroxine (SYNTHROID, LEVOTHROID) 137 MCG tablet Take 137 mcg by mouth at bedtime.   Yes Historical Provider, MD  valACYclovir (VALTREX) 1000 MG tablet Take 1,000 mg by mouth once as needed (fever blisters, patient takes 2 tablets for 2 days).  05/05/12  Historical Provider, MD   BP 104/48  Pulse 90  Temp(Src) 98.1 F (36.7 C) (Oral)  Resp 18  Ht 5\' 7"  (1.702 m)  Wt 213 lb (96.616 kg)  BMI 33.35 kg/m2  SpO2 100% Filed Vitals:   07/07/13 1656 07/07/13 1700 07/07/13 1730 07/07/13 1800  BP: 118/62 124/66 121/66 125/64  Pulse: 86 85 94 100  Temp:      TempSrc:      Resp: 18     Height:      Weight:      SpO2: 95% 93% 93% 92%    Physical Exam 1445: Physical examination:  Nursing notes reviewed; Vital signs and O2 SAT reviewed;  Constitutional: Well developed, Well nourished, Uncomfortable appearing.; Head:  Normocephalic,  atraumatic; Eyes: EOMI, PERRL, No scleral icterus; ENMT: Mouth and pharynx normal, Mucous membranes dry; Neck: Supple, Full range of motion, No lymphadenopathy; Cardiovascular: Regular rate and rhythm, No gallop; Respiratory: Breath sounds clear & equal bilaterally, No rales, rhonchi, wheezes.  Speaking full sentences with ease, Normal respiratory effort/excursion; Chest: Nontender, Movement normal; Abdomen: Soft, +mild diffuse tenderness to palp. No rebound or guarding. Nondistended, Normal bowel sounds; Genitourinary: No CVA tenderness; Extremities: Pulses normal, No tenderness, No edema, No calf edema or asymmetry.; Neuro: AA&Ox3, Major CN grossly intact.  Speech clear. No gross focal motor or sensory deficits in extremities. Climbs on and off stretcher easily by herself. Gait steady.; Skin: Color normal, Warm, Dry.   ED Course  Procedures     EKG Interpretation None      MDM  MDM Reviewed: previous chart, nursing note and vitals Reviewed previous: labs Interpretation: labs, CT scan and x-ray     Results for orders placed during the hospital encounter of 07/07/13  CLOSTRIDIUM DIFFICILE BY PCR      Result Value Ref Range   C difficile by pcr NEGATIVE  NEGATIVE  CBC WITH DIFFERENTIAL      Result Value Ref Range   WBC 7.6  4.0 - 10.5 K/uL   RBC 5.79 (*) 3.87 - 5.11 MIL/uL   Hemoglobin 16.9 (*) 12.0 - 15.0 g/dL   HCT 49.4 (*) 36.0 - 46.0 %   MCV 85.3  78.0 - 100.0 fL   MCH 29.2  26.0 - 34.0 pg   MCHC 34.2  30.0 - 36.0 g/dL   RDW 13.5  11.5 - 15.5 %   Platelets 223  150 - 400 K/uL   Neutrophils Relative % 86 (*) 43 - 77 %   Neutro Abs 6.5  1.7 - 7.7 K/uL   Lymphocytes Relative 5 (*) 12 - 46 %   Lymphs Abs 0.4 (*) 0.7 - 4.0 K/uL   Monocytes Relative 9  3 - 12 %   Monocytes Absolute 0.7  0.1 - 1.0 K/uL   Eosinophils Relative 0  0 - 5 %   Eosinophils Absolute 0.0  0.0 - 0.7 K/uL   Basophils Relative 0  0 - 1 %   Basophils Absolute 0.0  0.0 - 0.1 K/uL  COMPREHENSIVE METABOLIC  PANEL      Result Value Ref Range   Sodium 140  137 - 147 mEq/L   Potassium 4.1  3.7 - 5.3 mEq/L   Chloride 100  96 - 112 mEq/L   CO2 23  19 - 32 mEq/L   Glucose, Bld 126 (*) 70 - 99 mg/dL   BUN 20  6 - 23 mg/dL   Creatinine, Ser 0.85  0.50 - 1.10 mg/dL   Calcium 9.9  8.4 - 10.5 mg/dL   Total Protein 8.5 (*) 6.0 - 8.3 g/dL   Albumin 4.1  3.5 - 5.2 g/dL   AST 18  0 - 37 U/L   ALT 22  0 - 35 U/L   Alkaline Phosphatase 86  39 - 117 U/L   Total Bilirubin 0.5  0.3 - 1.2 mg/dL   GFR calc non Af Amer 76 (*) >90 mL/min   GFR calc Af Amer 88 (*) >90 mL/min   Anion gap 17 (*) 5 - 15  URINALYSIS, ROUTINE W REFLEX MICROSCOPIC      Result Value Ref Range   Color, Urine YELLOW  YELLOW   APPearance CLEAR  CLEAR   Specific Gravity, Urine <1.005 (*) 1.005 - 1.030   pH 5.0  5.0 - 8.0   Glucose, UA NEGATIVE  NEGATIVE mg/dL   Hgb urine dipstick TRACE (*) NEGATIVE   Bilirubin Urine NEGATIVE  NEGATIVE   Ketones, ur NEGATIVE  NEGATIVE mg/dL   Protein, ur NEGATIVE  NEGATIVE mg/dL   Urobilinogen, UA 0.2  0.0 - 1.0 mg/dL   Nitrite NEGATIVE  NEGATIVE   Leukocytes, UA NEGATIVE  NEGATIVE  LIPASE, BLOOD      Result Value Ref Range   Lipase 27  11 - 59 U/L  URINE MICROSCOPIC-ADD ON      Result Value Ref Range   Squamous Epithelial / LPF RARE  RARE   WBC, UA 0-2  <3 WBC/hpf   RBC / HPF 0-2  <3 RBC/hpf   Bacteria, UA RARE  RARE   Ct Abdomen Pelvis W Contrast 07/07/2013   CLINICAL DATA:  Lower abdominal pain, nausea, past history of laparoscopic gastric banding, reflux, hysterectomy  EXAM: CT ABDOMEN AND PELVIS WITH CONTRAST  TECHNIQUE: Multidetector CT imaging of the abdomen and pelvis was performed using the standard protocol following bolus administration of intravenous contrast. Sagittal and coronal MPR images reconstructed from axial data set.  CONTRAST:  150mL OMNIPAQUE IOHEXOL 300 MG/ML SOLN, 25mL OMNIPAQUE IOHEXOL 300 MG/ML SOLN  COMPARISON:  None  FINDINGS: Minimal dependent atelectasis RIGHT lower  lobe.  Liver, spleen, pancreas, kidneys, and adrenal glands normal.  Post laparoscopic gastric banding.  Normal appendix.  Stomach and bowel loops otherwise unremarkable.  Bladder decompressed with unremarkable ureters.  Uterus surgically absent with normal sized ovaries.  Minimal scattered atherosclerotic calcification.  No mass, adenopathy, free air or free fluid.  No hernia or acute bone lesion.  Degenerative disc disease lumbar spine greatest at L5-S1.  IMPRESSION: Post laparoscopic gastric banding.  No acute abnormalities.   Electronically Signed   By: Lavonia Dana M.D.   On: 07/07/2013 16:19    1820:  Pt has tol PO well while in the ED without N/V.  Has stooled while in the ED; cdiff negative, GI pathogen panel pending.  Abd benign, VSS. Pt has ambulated around the ED with steady, upright gait. Pt states she feels better and wants to go home now. Dx and testing d/w pt and family.  Questions answered.  Verb understanding, agreeable to d/c home with outpt f/u.    Alfonzo Feller, DO 07/10/13 2149

## 2013-07-07 NOTE — ED Notes (Signed)
Pt tolerated water

## 2013-07-07 NOTE — ED Notes (Signed)
Pt tolerating PO fluids well

## 2013-07-07 NOTE — ED Notes (Signed)
Urinalysis clicked off in error. 

## 2013-07-07 NOTE — Discharge Instructions (Signed)
°Emergency Department Resource Guide °1) Find a Doctor and Pay Out of Pocket °Although you won't have to find out who is covered by your insurance plan, it is a good idea to ask around and get recommendations. You will then need to call the office and see if the doctor you have chosen will accept you as a new patient and what types of options they offer for patients who are self-pay. Some doctors offer discounts or will set up payment plans for their patients who do not have insurance, but you will need to ask so you aren't surprised when you get to your appointment. ° °2) Contact Your Local Health Department °Not all health departments have doctors that can see patients for sick visits, but many do, so it is worth a call to see if yours does. If you don't know where your local health department is, you can check in your phone book. The CDC also has a tool to help you locate your state's health department, and many state websites also have listings of all of their local health departments. ° °3) Find a Walk-in Clinic °If your illness is not likely to be very severe or complicated, you may want to try a walk in clinic. These are popping up all over the country in pharmacies, drugstores, and shopping centers. They're usually staffed by nurse practitioners or physician assistants that have been trained to treat common illnesses and complaints. They're usually fairly quick and inexpensive. However, if you have serious medical issues or chronic medical problems, these are probably not your best option. ° °No Primary Care Doctor: °- Call [facility] at  832-8000 - they can help you locate a primary care doctor that  accepts your insurance, provides certain services, etc. °- Physician Referral Service- 1-800-533-3463 ° °Chronic Pain Problems: °Organization         Address  Phone   Notes  °[facility]  (336) 297-2271 Patients need to be referred by their primary care doctor.  ° °Medication  Assistance: °Organization         Address  Phone   Notes  °[place] [facility] [address] °[place], [place] [address] (336) 641-8030 --Must be a resident of [place] °-- Must have NO insurance coverage whatsoever (no Medicaid/ Medicare, etc.) °-- The pt. MUST have a primary care doctor that directs their care regularly and follows them in the community °  °[facility]  (866) 331-1348   °[employer]  (888) 892-1162   ° °Agencies that provide inexpensive medical care: °Organization         Address  Phone   Notes  °[facility]  (336) 832-8035   °[facility]    (336) 832-7272   °[facility] [address] °[place], [place] [address] (336) 832-4777   °[facility] [address], °[place] (336) 271-4999   °[facility]    (336) 373-0678   °[facility]    (336) 272-1050   °[facility] ° [address], [place] Phone:  (336) 832-4444, Fax:  (336) 832-4440 Hours of Operation:  9 am - 6 pm, M-F.  Also accepts Medicaid/Medicare and self-pay.  °[facility] ° [address], [place] Phone: (336) 832-3150, Fax: (336) 832-3151. Hours of Operation:  8:30 am - 5:30 pm, M-F.  Also accepts Medicaid and self-pay.  °[facility] [place] [address], [place] Phone: (336) 878-6027   °[facility] [address], [place], [place] (336)723-1848, Ext. 123 Mondays & Thursdays: 7-9 AM.  First 15 patients are seen on a first come, first serve basis. °  ° °Medicaid-accepting [place] Providers: ° °Organization         Address  Phone   Notes  °[facility] [address], [place] (336) 641-2100 Also accepts self-pay patients.  °[facility] [address], [place] ° (336) 856-9996   °[facility] [address], [place]  (336) 288-8857   °[facility] [address] [place] [address], [place] (336) 299-7000   °Veita Bland [address], [place]  ° (336) 373-1557 Only accepts [place] Access Medicaid patients after they have their name applied to their card.  ° °Self-Pay (no insurance) in [place]: ° °Organization         Address  Phone   Notes  °Sickle Cell Patients, [facility] [address], [place] (336) 832-1970   °[facility] [address], [place] (336) 832-4400   °[facility] ° [address], [place] (336) 992-4800   °[facility]/Dr. Osei-Bonsu ° [address] [place] [address], [place] or [address], [place] (336) [phone] Phone number for both [place] and [place] locations is the same.  °[facility] [address], [place] (336) 299-0000   °[facility] [address] [place] [address], [place] or [address] (336) 852-7530 °(336) 878-2260   °[facility] [address], [place] (336) 350-1642, phone; (336) 294-5005, fax Sees patients 1st and 3rd Saturday of every month.  Must not qualify for public or private insurance (i.e. Medicaid, Medicare, [place] Health Choice, Veterans' Benefits) • Household income should be no more than 200% of the poverty level •The clinic cannot treat you if you are pregnant or think you are pregnant • Sexually transmitted diseases are not treated at the clinic.  ° ° °Dental Care: °Organization         Address  Phone  Notes  °[place] [facility] [address], [place] (336) 641-6152 Accepts children up to age 21 who are enrolled in Medicaid or [place] Health Choice; pregnant women with a Medicaid card; and children who have applied for Medicaid or [place] Health Choice, but were declined, whose parents can pay a reduced fee at time of service.  °[place]  [facility] [place]  [address], [place] (336) 641-7733 Accepts children up to age 21 who are enrolled in Medicaid or [place] Health Choice; pregnant women with a Medicaid card; and children who have applied for Medicaid or  Health Choice, but were declined, whose parents can pay a reduced fee at time of service.  °[facility] ° [address],  (336) 641-4533 Patients are seen by appointment only. Walk-ins are not accepted. [facility] will see patients 18 years of age and older. °Monday - Tuesday (8am-5pm) °Most Wednesdays (8:30-5pm) °$30 per visit, cash only  °[facility] ° [address], [place] (336) 641-4533 Patients are seen by appointment only. Walk-ins are not accepted. [facility] will see patients 18 years of age and older. °One   Wednesday Evening (Monthly: Volunteer Based).  $30 per visit, cash only  °[facility]  (919) 537-3737 for adults; Children under age 4, call Graduate Pediatric Dentistry at ([phone]) [phone]. Children aged 4-14, please call (919) [phone] to request a pediatric application. ° Dental services are provided in all areas of dental care including fillings, crowns and bridges, complete and partial dentures, implants, gum treatment, root canals, and extractions. Preventive care is also provided. Treatment is provided to both adults and children. °Patients are selected via a lottery and there is often a waiting list. °  °[facility] [address], °[place] ° (336) 763-8833 [email or web address] °  °[facility] [address], [place], [place] (336)723-1848, Ext. 123 Second and Fourth Thursday of each month, opens at 6:30 AM; Clinic ends at 9 AM.  Patients are seen on a first-come first-served basis, and a limited number are seen during each clinic.  ° °[facility] ° [address], [place], [place] (336) 723-7904    Eligibility Requirements °You must have lived in [place], or [place] counties for at least the last three months. °  You cannot be eligible for state or federal sponsored healthcare insurance, including Veterans Administration, Medicaid, or Medicare. °  You generally cannot be eligible for healthcare insurance through your employer.  °  How to apply: °Eligibility screenings are held every Tuesday and Wednesday afternoon from 1:00 pm until 4:00 pm. You do not need an appointment for the interview!  °[facility] [address], [place], [place] 336-631-2330   °[facility]  336-342-8273   °[facility]  336-703-3100   °[facility]  336-570-6415   ° °Behavioral Health Resources in the Community: °Intensive Outpatient Programs °Organization         Address  Phone  Notes  °[facility] [address], [place], [place] 336-878-6098   °[facility] [address], [place], [place] 336-832-9800   °[facility] [address], [place], [place] ° 336-882-2125   °[facility] [address],  °[place], [place] 1-800-853-5163 or 336-641-4981   °Substance Abuse Resources °Organization         Address  Phone  Notes  °[facility]  336-882-2125   °[facility]  336-784-9470   °[facility]  336-285-9073   °[facility]  336-845-3988   °Residential & Outpatient Substance Abuse Program  1-800-659-3381   °Psychological Services °Organization         Address  Phone  Notes  °[facility]  336- 832-9600   °[facility]  336- 378-7881   °[facility] [address], [place] 1-800-853-5163 or 336-641-4981   ° °Mobile Crisis Teams °Organization         Address  Phone  Notes  °[facility], Mobile Crisis Care Unit  1-877-626-1772   °Assertive °[facility] ° [address]  [place], [place] 336-834-9664   °Sharon DeEsch [address] °[place] 336-554-5454   ° °Self-Help/Support Groups °Organization         Address  Phone             Notes  °[facility] - variety of support groups  336- 373-1402 Call for more information  °Narcotics Anonymous (NA), [facility] [address], °[place]  2 meetings at this location  ° °  Residential Treatment Programs Organization         Address  Phone  Notes  [facility] [address],    [place]  1-5343707963   [facility]  [address], [place] [address], [place], [place]   [place], [place] (518)[phone] Admissions: 8am-3pm M-F  [facility] [address]    [place], [place] 536-144-3154   [facility] [address] [place], [place], [place]   [facility] [address],  [place], [place]   [facility] Intensive Outpatient [address], [place], [place]   [facility] [place]  [place], [place] 1-225-269-7529 or 3463501292   [facility] [address], [place], [place] Accepts Medicaid  [facility] [address],  [place] 1-347-122-6524 Substance Abuse/Addiction Treatment   [facility]         Address  Phone  Notes  [facility]  (726)279-7997   Domenic Schwab, PhD [address] [place], [place]   (281)712-9906 or 732-281-4674   [facility] [place], [place] 949-605-1953   [facility] [address], [place], [place] (779)866-7596 Insurance/Medicaid/sponsorship through [facility] [address]                                    [place], [place] [phone])[phone] [place] [address], [place] [phone]    Dr. Adele Schilder  (319) [phone]   [facility] Dept. 1) [address] [place] 2) [place] 3)  [address], [place] [phone]  [phone]   [place] 423-361-6793 or ([phone] (After Hours)      Take the prescriptions as directed.  Increase your fluid intake (ie:  Gatoraide) for the next few days.  Eat a bland diet and advance to your regular diet slowly as you can tolerate it.   Avoid full strength juices, as well as milk and milk products until your diarrhea has resolved.   Call your regular medical doctor tomorrow to schedule a follow up appointment within the next 2 days.  Return to the Emergency Department immediately if not improving (or even worsening) despite taking the medicines as prescribed, any black or bloody stool or vomit, if you develop a fever over "101," or for any other concerns.

## 2013-07-07 NOTE — ED Notes (Signed)
Pt reports chills,abdominal pain, n/v/d since 9am this am. Pt reports was started on celebrex and received a cortisone shot in left knee yesterday.nad noted. Pt alert and oriented. No active vomiting.

## 2013-07-08 LAB — URINE CULTURE
COLONY COUNT: NO GROWTH
CULTURE: NO GROWTH

## 2013-07-12 LAB — GI PATHOGEN PANEL BY PCR, STOOL
C difficile toxin A/B: NEGATIVE
CAMPYLOBACTER BY PCR: NEGATIVE
CRYPTOSPORIDIUM BY PCR: NEGATIVE
E COLI (ETEC) LT/ST: NEGATIVE
E coli (STEC): NEGATIVE
E coli 0157 by PCR: NEGATIVE
G lamblia by PCR: NEGATIVE
Norovirus GI/GII: NEGATIVE
Rotavirus A by PCR: NEGATIVE
Salmonella by PCR: NEGATIVE
Shigella by PCR: NEGATIVE

## 2013-08-19 ENCOUNTER — Telehealth: Payer: Self-pay

## 2013-08-19 NOTE — Telephone Encounter (Signed)
Tried to call with no answer  

## 2013-08-31 NOTE — Telephone Encounter (Signed)
Called pt and she works 3rd shift and was not awake good. She said she will call back.

## 2013-09-09 ENCOUNTER — Encounter (INDEPENDENT_AMBULATORY_CARE_PROVIDER_SITE_OTHER): Payer: 59

## 2013-09-09 NOTE — Telephone Encounter (Signed)
Letter to pt and PCP.  

## 2014-07-08 DIAGNOSIS — M25562 Pain in left knee: Secondary | ICD-10-CM

## 2014-07-08 HISTORY — DX: Pain in left knee: M25.562

## 2014-07-13 ENCOUNTER — Other Ambulatory Visit: Payer: Self-pay | Admitting: Physician Assistant

## 2014-07-18 ENCOUNTER — Encounter (HOSPITAL_BASED_OUTPATIENT_CLINIC_OR_DEPARTMENT_OTHER): Payer: Self-pay | Admitting: *Deleted

## 2014-07-19 ENCOUNTER — Other Ambulatory Visit: Payer: Self-pay | Admitting: Physician Assistant

## 2014-07-19 NOTE — H&P (Signed)
This is a 54 year-old new patient who presents to our clinic with left knee pain.  Angela Abbott has a longstanding history of left knee pain and possible left sided radiculopathy over the past several years.  She saw her primary care physician back in June of 2015 where she had an intraarticular Cortisone injection to the left knee.  She states this has helped up until recently.  Over the past two months however she notes a different type of pain to her left knee.  No known injury or change in activity.  The pain she is now experiencing is posterolateral.  She describes this as a constant ache with associated rest and night pain.  No swelling, mechanical symptoms or instability noted.  Pain is aggravated with squatting, pivoting and going up and down stairs.  She has tried ice and heat with minimal relief.  She tried Celebrex which initially helped, but is really not helping much at all anymore.  She describes some burning in the lateral aspect of her knee, but no other radicular symptoms.  No previous history of surgical intervention in the past.   Past medical history: Significant for hypothyroidism and acid reflux.  Negative for diabetes, heart disease or high blood pressure.      Allergies: No known drug allergies. Current medications: Synthroid, Nexium, Estradiol, Celebrex and Tolterodine.   Family history: Significant for diabetes, high blood pressure, heart disease and arthritis.  Social history: She smokes 10 cigarettes a day.  She socially drinks on a weekly basis.  She is divorced and works in Psychologist, educational at [employer].      EXAMINATION: Well-developed, well-nourished female in no acute distress.  Alert and oriented x 3.  Height: 5?6.  Weight: 221 pounds.  Blood pressure: 110/80.  Pulse: 70.  Examination of her left knee reveals no effusion.  Range of motion 0-130 degrees.  Minimal patellofemoral crepitus.  Lateral joint line tenderness with positive lateral McMurray.  Negative log roll.   Negative straight leg raise.    IMPRESSION: Probable degenerative meniscus tear, left knee.  PLAN: Because Angela Abbott got such great relief from an intraarticular Corticosteroid injection last summer, we are going to proceed with another one today.  However, because of the new type pain she has been experiencing over the last 2 months, we are also going to proceed with an MRI to assess her meniscus and degree of arthritic change.  We will call her after her MRI is complete.    PROCEDURE NOTE: The patient's clinical condition is marked by substantial pain and/or significant functional disability.  Other conservative therapy has not provided relief, is contraindicated, or not appropriate.  There is a reasonable likelihood that injection will significantly improve the patient's pain and/or functional disability. Patient is seated on the exam table.  The left knee is prepped with Betadine and alcohol and injected with 2:6 Depo-Medrol/Marcaine.  Patient tolerated the procedure without difficulty.   Angela Abbott, M.D.  Addendum:  MRI left knee from 04/22/14 reveals horizontal tear lateral meniscus.  Surgical intervention of left knee arthroscopy with partial lateral meniscectomy and chondroplasty has been discussed with the patient.  She would like to proceed.  Risks, benefits and possible complications of surgery reviewed.  Rehab and recovery time discussed.  Paperwork completed.  We will see patient at time of operative intervention.  -Angela Cooler, PA-C

## 2014-07-21 ENCOUNTER — Ambulatory Visit (HOSPITAL_BASED_OUTPATIENT_CLINIC_OR_DEPARTMENT_OTHER): Payer: Commercial Managed Care - HMO | Admitting: Anesthesiology

## 2014-07-21 ENCOUNTER — Encounter (HOSPITAL_BASED_OUTPATIENT_CLINIC_OR_DEPARTMENT_OTHER)
Admission: RE | Disposition: A | Discharge status: Home / Self Care | Payer: Self-pay | Source: Ambulatory Visit | Attending: Orthopedic Surgery

## 2014-07-21 ENCOUNTER — Ambulatory Visit (HOSPITAL_BASED_OUTPATIENT_CLINIC_OR_DEPARTMENT_OTHER)
Admission: RE | Admit: 2014-07-21 | Discharge: 2014-07-21 | Disposition: A | Discharge status: Home / Self Care | Payer: Commercial Managed Care - HMO | Source: Ambulatory Visit | Attending: Orthopedic Surgery | Admitting: Orthopedic Surgery

## 2014-07-21 ENCOUNTER — Encounter (HOSPITAL_BASED_OUTPATIENT_CLINIC_OR_DEPARTMENT_OTHER): Payer: Self-pay | Admitting: Anesthesiology

## 2014-07-21 DIAGNOSIS — Z79899 Other long term (current) drug therapy: Secondary | ICD-10-CM | POA: Diagnosis not present

## 2014-07-21 DIAGNOSIS — M23222 Derangement of posterior horn of medial meniscus due to old tear or injury, left knee: Secondary | ICD-10-CM | POA: Insufficient documentation

## 2014-07-21 DIAGNOSIS — M659 Synovitis and tenosynovitis, unspecified: Secondary | ICD-10-CM | POA: Insufficient documentation

## 2014-07-21 DIAGNOSIS — M2242 Chondromalacia patellae, left knee: Secondary | ICD-10-CM | POA: Insufficient documentation

## 2014-07-21 DIAGNOSIS — M23242 Derangement of anterior horn of lateral meniscus due to old tear or injury, left knee: Secondary | ICD-10-CM | POA: Insufficient documentation

## 2014-07-21 DIAGNOSIS — E039 Hypothyroidism, unspecified: Secondary | ICD-10-CM | POA: Insufficient documentation

## 2014-07-21 DIAGNOSIS — F17210 Nicotine dependence, cigarettes, uncomplicated: Secondary | ICD-10-CM | POA: Diagnosis not present

## 2014-07-21 DIAGNOSIS — S83282A Other tear of lateral meniscus, current injury, left knee, initial encounter: Secondary | ICD-10-CM | POA: Diagnosis present

## 2014-07-21 DIAGNOSIS — K219 Gastro-esophageal reflux disease without esophagitis: Secondary | ICD-10-CM | POA: Insufficient documentation

## 2014-07-21 HISTORY — DX: Unspecified osteoarthritis, unspecified site: M19.90

## 2014-07-21 HISTORY — PX: KNEE ARTHROSCOPY WITH LATERAL MENISECTOMY: SHX6193

## 2014-07-21 HISTORY — DX: Gastro-esophageal reflux disease without esophagitis: K21.9

## 2014-07-21 HISTORY — DX: Hypothyroidism, unspecified: E03.9

## 2014-07-21 HISTORY — DX: Stress incontinence (female) (male): N39.3

## 2014-07-21 HISTORY — DX: Presence of dental prosthetic device (complete) (partial): Z97.2

## 2014-07-21 HISTORY — DX: Dental restoration status: Z98.811

## 2014-07-21 HISTORY — PX: KNEE ARTHROSCOPY WITH MEDIAL MENISECTOMY: SHX5651

## 2014-07-21 HISTORY — DX: Pain in left knee: M25.562

## 2014-07-21 LAB — POCT HEMOGLOBIN-HEMACUE: Hemoglobin: 14.2 g/dL (ref 12.0–15.0)

## 2014-07-21 SURGERY — ARTHROSCOPY, KNEE, WITH LATERAL MENISCECTOMY
Anesthesia: General | Site: Knee | Laterality: Left

## 2014-07-21 MED ORDER — OXYCODONE HCL 5 MG PO TABS
ORAL_TABLET | ORAL | Status: AC
  Filled 2014-07-21: qty 1

## 2014-07-21 MED ORDER — ONDANSETRON HCL 4 MG PO TABS: 4.0000 mg | ORAL_TABLET | Freq: Three times a day (TID) | ORAL | Status: DC | PRN

## 2014-07-21 MED ORDER — CEFAZOLIN SODIUM-DEXTROSE 2-3 GM-% IV SOLR
2.0000 g | INTRAVENOUS | Status: AC
  Administered 2014-07-21: 2 g via INTRAVENOUS

## 2014-07-21 MED ORDER — LIDOCAINE HCL (CARDIAC) 20 MG/ML IV SOLN
INTRAVENOUS | Status: DC | PRN
  Administered 2014-07-21: 60 mg via INTRAVENOUS

## 2014-07-21 MED ORDER — FENTANYL CITRATE (PF) 100 MCG/2ML IJ SOLN
50.0000 ug | INTRAMUSCULAR | Status: AC | PRN
  Administered 2014-07-21: 100 ug via INTRAVENOUS
  Administered 2014-07-21 (×2): 25 ug via INTRAVENOUS

## 2014-07-21 MED ORDER — SCOPOLAMINE 1 MG/3DAYS TD PT72: 1.0000 | MEDICATED_PATCH | Freq: Once | TRANSDERMAL | Status: DC | PRN

## 2014-07-21 MED ORDER — PROMETHAZINE HCL 25 MG/ML IJ SOLN: 6.2500 mg | INTRAMUSCULAR | Status: DC | PRN

## 2014-07-21 MED ORDER — METHYLPREDNISOLONE ACETATE 80 MG/ML IJ SUSP
INTRAMUSCULAR | Status: AC
  Filled 2014-07-21: qty 1

## 2014-07-21 MED ORDER — LACTATED RINGERS IV SOLN
INTRAVENOUS | Status: DC
  Administered 2014-07-21 (×2): via INTRAVENOUS

## 2014-07-21 MED ORDER — BUPIVACAINE HCL (PF) 0.5 % IJ SOLN
INTRAMUSCULAR | Status: DC | PRN
  Administered 2014-07-21: 20 mL via INTRA_ARTICULAR

## 2014-07-21 MED ORDER — FENTANYL CITRATE (PF) 100 MCG/2ML IJ SOLN
INTRAMUSCULAR | Status: AC
  Filled 2014-07-21: qty 4

## 2014-07-21 MED ORDER — PROPOFOL 10 MG/ML IV BOLUS
INTRAVENOUS | Status: DC | PRN
  Administered 2014-07-21: 200 mg via INTRAVENOUS

## 2014-07-21 MED ORDER — OXYCODONE HCL 5 MG PO TABS
5.0000 mg | ORAL_TABLET | Freq: Once | ORAL | Status: AC
  Administered 2014-07-21: 5 mg via ORAL

## 2014-07-21 MED ORDER — METHYLPREDNISOLONE ACETATE 80 MG/ML IJ SUSP
INTRAMUSCULAR | Status: DC | PRN
  Administered 2014-07-21: 80 mg via INTRA_ARTICULAR

## 2014-07-21 MED ORDER — SODIUM CHLORIDE 0.9 % IR SOLN
Status: DC | PRN
  Administered 2014-07-21: 4500 mL

## 2014-07-21 MED ORDER — GLYCOPYRROLATE 0.2 MG/ML IJ SOLN: 0.2000 mg | Freq: Once | INTRAMUSCULAR | Status: DC | PRN

## 2014-07-21 MED ORDER — HYDROMORPHONE HCL 1 MG/ML IJ SOLN
INTRAMUSCULAR | Status: AC
  Filled 2014-07-21: qty 1

## 2014-07-21 MED ORDER — DEXAMETHASONE SODIUM PHOSPHATE 4 MG/ML IJ SOLN
INTRAMUSCULAR | Status: DC | PRN
  Administered 2014-07-21: 10 mg via INTRAVENOUS

## 2014-07-21 MED ORDER — CHLORHEXIDINE GLUCONATE 4 % EX LIQD
60.0000 mL | Freq: Once | CUTANEOUS | Status: DC
Start: 2014-07-21 — End: 2014-07-21

## 2014-07-21 MED ORDER — HYDROMORPHONE HCL 1 MG/ML IJ SOLN
0.2500 mg | INTRAMUSCULAR | Status: DC | PRN
  Administered 2014-07-21: 0.5 mg via INTRAVENOUS

## 2014-07-21 MED ORDER — MIDAZOLAM HCL 2 MG/2ML IJ SOLN
1.0000 mg | INTRAMUSCULAR | Status: DC | PRN
  Administered 2014-07-21: 2 mg via INTRAVENOUS

## 2014-07-21 MED ORDER — OXYCODONE-ACETAMINOPHEN 5-325 MG PO TABS: 1.0000 | ORAL_TABLET | ORAL | Status: DC | PRN

## 2014-07-21 MED ORDER — MIDAZOLAM HCL 2 MG/2ML IJ SOLN
INTRAMUSCULAR | Status: AC
  Filled 2014-07-21: qty 2

## 2014-07-21 MED ORDER — LACTATED RINGERS IV SOLN
INTRAVENOUS | Status: DC
  Administered 2014-07-21: 14:00:00 via INTRAVENOUS

## 2014-07-21 SURGICAL SUPPLY — 40 items
BANDAGE ELASTIC 6 VELCRO ST LF (GAUZE/BANDAGES/DRESSINGS) ×2 IMPLANT
BLADE CUDA 5.5 (BLADE) IMPLANT
BLADE CUDA GRT WHITE 3.5 (BLADE) IMPLANT
BLADE CUTTER GATOR 3.5 (BLADE) ×2 IMPLANT
BLADE CUTTER MENIS 5.5 (BLADE) IMPLANT
BLADE GREAT WHITE 4.2 (BLADE) ×2 IMPLANT
BUR OVAL 4.0 (BURR) IMPLANT
CUTTER MENISCUS  4.2MM (BLADE)
CUTTER MENISCUS 4.2MM (BLADE) IMPLANT
DRAPE ARTHROSCOPY W/POUCH 90 (DRAPES) ×2 IMPLANT
DURAPREP 26ML APPLICATOR (WOUND CARE) ×2 IMPLANT
ELECT MENISCUS 165MM 90D (ELECTRODE) IMPLANT
ELECT REM PT RETURN 9FT ADLT (ELECTROSURGICAL) ×2
ELECTRODE REM PT RTRN 9FT ADLT (ELECTROSURGICAL) IMPLANT
GAUZE SPONGE 4X4 12PLY STRL (GAUZE/BANDAGES/DRESSINGS) ×4 IMPLANT
GAUZE XEROFORM 1X8 LF (GAUZE/BANDAGES/DRESSINGS) ×2 IMPLANT
GLOVE BIO SURGEON STRL SZ 6.5 (GLOVE) ×1 IMPLANT
GLOVE BIOGEL PI IND STRL 7.0 (GLOVE) ×1 IMPLANT
GLOVE BIOGEL PI INDICATOR 7.0 (GLOVE) ×3
GLOVE ECLIPSE 7.0 STRL STRAW (GLOVE) ×2 IMPLANT
GLOVE ORTHO TXT STRL SZ7.5 (GLOVE) ×1 IMPLANT
GLOVE SURG ORTHO 8.0 STRL STRW (GLOVE) ×3 IMPLANT
GOWN STRL REUS W/ TWL LRG LVL3 (GOWN DISPOSABLE) ×2 IMPLANT
GOWN STRL REUS W/ TWL XL LVL3 (GOWN DISPOSABLE) ×1 IMPLANT
GOWN STRL REUS W/TWL LRG LVL3 (GOWN DISPOSABLE) ×4
GOWN STRL REUS W/TWL XL LVL3 (GOWN DISPOSABLE) ×2
HOLDER KNEE FOAM BLUE (MISCELLANEOUS) ×2 IMPLANT
IV NS IRRIG 3000ML ARTHROMATIC (IV SOLUTION) ×6 IMPLANT
KNEE WRAP E Z 3 GEL PACK (MISCELLANEOUS) IMPLANT
MANIFOLD NEPTUNE II (INSTRUMENTS) ×2 IMPLANT
NEEDLE HYPO 22GX1.5 SAFETY (NEEDLE) ×2 IMPLANT
PACK ARTHROSCOPY DSU (CUSTOM PROCEDURE TRAY) ×2 IMPLANT
PACK BASIN DAY SURGERY FS (CUSTOM PROCEDURE TRAY) ×2 IMPLANT
PENCIL BUTTON HOLSTER BLD 10FT (ELECTRODE) IMPLANT
SET ARTHROSCOPY TUBING (MISCELLANEOUS) ×2
SET ARTHROSCOPY TUBING LN (MISCELLANEOUS) ×1 IMPLANT
SUT ETHILON 3 0 PS 1 (SUTURE) ×2 IMPLANT
SUT VIC AB 3-0 FS2 27 (SUTURE) IMPLANT
TOWEL OR 17X24 6PK STRL BLUE (TOWEL DISPOSABLE) ×2 IMPLANT
WATER STERILE IRR 1000ML POUR (IV SOLUTION) ×2 IMPLANT

## 2014-07-21 NOTE — Anesthesia Postprocedure Evaluation (Signed)
  Anesthesia Post-op Note  Patient: Angela Abbott  Procedure(s) Performed: Procedure(s): LEFT KNEE ARTHROSCOPY,  CHONDROPLASTY, WITH LATERAL AND MEDIAL  MENISCECTOMIES (Left) KNEE ARTHROSCOPY WITH MEDIAL MENISECTOMY (Left)  Patient Location: PACU  Anesthesia Type: General   Level of Consciousness: awake, alert  and oriented  Airway and Oxygen Therapy: Patient Spontanous Breathing  Post-op Pain: none  Post-op Assessment: Post-op Vital signs reviewed  Post-op Vital Signs: Reviewed  Last Vitals:  Filed Vitals:   07/21/14 1639  BP:   Pulse: 63  Temp:   Resp: 20    Complications: No apparent anesthesia complications

## 2014-07-21 NOTE — Anesthesia Preprocedure Evaluation (Signed)
Anesthesia Evaluation  Patient identified by MRN, date of birth, ID band Patient awake    Reviewed: Allergy & Precautions, NPO status , Patient's Chart, lab work & pertinent test results  Airway Mallampati: II  TM Distance: >3 FB Neck ROM: Full    Dental  (+) Partial Upper, Partial Lower   Pulmonary Current Smoker,    Pulmonary exam normal       Cardiovascular negative cardio ROS Normal cardiovascular exam    Neuro/Psych negative neurological ROS  negative psych ROS   GI/Hepatic Neg liver ROS, GERD-  ,  Endo/Other  Hypothyroidism   Renal/GU negative Renal ROS  negative genitourinary   Musculoskeletal  (+) Arthritis -,   Abdominal   Peds negative pediatric ROS (+)  Hematology negative hematology ROS (+)   Anesthesia Other Findings   Reproductive/Obstetrics negative OB ROS                             Anesthesia Physical Anesthesia Plan  ASA: II  Anesthesia Plan: General   Post-op Pain Management:    Induction: Intravenous  Airway Management Planned: LMA  Additional Equipment:   Intra-op Plan:   Post-operative Plan: Extubation in OR  Informed Consent: I have reviewed the patients History and Physical, chart, labs and discussed the procedure including the risks, benefits and alternatives for the proposed anesthesia with the patient or authorized representative who has indicated his/her understanding and acceptance.   Dental advisory given  Plan Discussed with: CRNA, Anesthesiologist and Surgeon  Anesthesia Plan Comments:         Anesthesia Quick Evaluation

## 2014-07-21 NOTE — Transfer of Care (Signed)
Immediate Anesthesia Transfer of Care Note  Patient: Angela Abbott  Procedure(s) Performed: Procedure(s): LEFT KNEE ARTHROSCOPY,  CHONDROPLASTY, WITH LATERAL AND MEDIAL  MENISCECTOMIES (Left) KNEE ARTHROSCOPY WITH MEDIAL MENISECTOMY (Left)  Patient Location: PACU  Anesthesia Type:General  Level of Consciousness: awake, sedated and patient cooperative  Airway & Oxygen Therapy: Patient Spontanous Breathing and Patient connected to face mask oxygen  Post-op Assessment: Report given to RN and Post -op Vital signs reviewed and stable  Post vital signs: Reviewed and stable  Last Vitals:  Filed Vitals:   07/21/14 1357  BP: 142/61  Pulse: 73  Temp: 36.8 C  Resp: 20    Complications: No apparent anesthesia complications and Patient re-intubated

## 2014-07-21 NOTE — H&P (View-Only) (Signed)
This is a 54 year-old new patient who presents to our clinic with left knee pain.  Angela Abbott has a longstanding history of left knee pain and possible left sided radiculopathy over the past several years.  She saw her primary care physician back in June of 2015 where she had an intraarticular Cortisone injection to the left knee.  She states this has helped up until recently.  Over the past two months however she notes a different type of pain to her left knee.  No known injury or change in activity.  The pain she is now experiencing is posterolateral.  She describes this as a constant ache with associated rest and night pain.  No swelling, mechanical symptoms or instability noted.  Pain is aggravated with squatting, pivoting and going up and down stairs.  She has tried ice and heat with minimal relief.  She tried Celebrex which initially helped, but is really not helping much at all anymore.  She describes some burning in the lateral aspect of her knee, but no other radicular symptoms.  No previous history of surgical intervention in the past.   Past medical history: Significant for hypothyroidism and acid reflux.  Negative for diabetes, heart disease or high blood pressure.      Allergies: No known drug allergies. Current medications: Synthroid, Nexium, Estradiol, Celebrex and Tolterodine.   Family history: Significant for diabetes, high blood pressure, heart disease and arthritis.  Social history: She smokes 10 cigarettes a day.  She socially drinks on a weekly basis.  She is divorced and works in Psychologist, educational at [employer].      EXAMINATION: Well-developed, well-nourished female in no acute distress.  Alert and oriented x 3.  Height: 5?6.  Weight: 221 pounds.  Blood pressure: 110/80.  Pulse: 70.  Examination of her left knee reveals no effusion.  Range of motion 0-130 degrees.  Minimal patellofemoral crepitus.  Lateral joint line tenderness with positive lateral McMurray.  Negative log roll.   Negative straight leg raise.    IMPRESSION: Probable degenerative meniscus tear, left knee.  PLAN: Because [name] got such great relief from an intraarticular Corticosteroid injection last summer, we are going to proceed with another one today.  However, because of the new type pain she has been experiencing over the last 2 months, we are also going to proceed with an MRI to assess her meniscus and degree of arthritic change.  We will call her after her MRI is complete.    PROCEDURE NOTE: The patient's clinical condition is marked by substantial pain and/or significant functional disability.  Other conservative therapy has not provided relief, is contraindicated, or not appropriate.  There is a reasonable likelihood that injection will significantly improve the patient's pain and/or functional disability. Patient is seated on the exam table.  The left knee is prepped with Betadine and alcohol and injected with 2:6 Depo-Medrol/Marcaine.  Patient tolerated the procedure without difficulty.   Ninetta Lights, M.D.  Addendum:  MRI left knee from 04/22/14 reveals horizontal tear lateral meniscus.  Surgical intervention of left knee arthroscopy with partial lateral meniscectomy and chondroplasty has been discussed with the patient.  She would like to proceed.  Risks, benefits and possible complications of surgery reviewed.  Rehab and recovery time discussed.  Paperwork completed.  We will see patient at time of operative intervention.  -Tawanna Cooler, PA-C

## 2014-07-21 NOTE — Anesthesia Procedure Notes (Signed)
Procedure Name: LMA Insertion Date/Time: 07/21/2014 3:20 PM Performed by: Lyndee Leo Pre-anesthesia Checklist: Patient identified, Emergency Drugs available, Suction available and Patient being monitored Patient Re-evaluated:Patient Re-evaluated prior to inductionOxygen Delivery Method: Circle System Utilized Preoxygenation: Pre-oxygenation with 100% oxygen Intubation Type: IV induction Ventilation: Mask ventilation without difficulty LMA: LMA inserted LMA Size: 4.0 Number of attempts: 1 Airway Equipment and Method: Bite block Placement Confirmation: positive ETCO2 Tube secured with: Tape Dental Injury: Teeth and Oropharynx as per pre-operative assessment

## 2014-07-21 NOTE — Discharge Instructions (Signed)
Discharge Instructions after Knee Arthroscopy ° ° °You will have a light dressing on your knee.  °Leave the dressing in place until the third day after your surgery and then remove it and place a band-aid over the stitches.  °After the bandage has been removed you may shower, but do not soak the incision. °You may begin gentle motion of your leg immediately after surgery. °Pump your foot up and down 20 times per hour, every hour you are awake.  °Apply ice to the knee 3 times per day for 30 minutes for the first 1 week until your knee is feeling comfortable again. Do not use heat.  °You may begin straight leg raising exercises (if you have a brace with it on). While lying down, pull your foot all the way up, tighten your quadriceps muscle and lift your heel off of the ground. Hold this position for 2 seconds, and then let the leg back down. Repeat the exercise 10 times, at least 3 times a day.  °Pain medicine has been prescribed for you.  °Use your medicine as needed over the first 48 hours, and then you can begin to taper your use. You may take Extra Strength Tylenol or Tylenol only in place of the pain pills.  ° ° °Please call [phone] for any problems. Including the following: ° °- excessive redness of the incisions °- drainage for more than 4 days °- fever of more than 101.5 F ° °*Please note that pain medications will not be refilled after hours or on weekends. ° ° °Post Anesthesia Home Care Instructions ° °Activity: °Get plenty of rest for the remainder of the day. A responsible adult should stay with you for 24 hours following the procedure.  °For the next 24 hours, DO NOT: °-Drive a car °-Operate machinery °-Drink alcoholic beverages °-Take any medication unless instructed by your physician °-Make any legal decisions or sign important papers. ° °Meals: °Start with liquid foods such as gelatin or soup. Progress to regular foods as tolerated. Avoid greasy, spicy, heavy foods. If nausea and/or vomiting  occur, drink only clear liquids until the nausea and/or vomiting subsides. Call your physician if vomiting continues. ° °Special Instructions/Symptoms: °Your throat may feel dry or sore from the anesthesia or the breathing tube placed in your throat during surgery. If this causes discomfort, gargle with warm salt water. The discomfort should disappear within 24 hours. ° °If you had a scopolamine patch placed behind your ear for the management of post- operative nausea and/or vomiting: ° °1. The medication in the patch is effective for 72 hours, after which it should be removed.  Wrap patch in a tissue and discard in the trash. Wash hands thoroughly with soap and water. °2. You may remove the patch earlier than 72 hours if you experience unpleasant side effects which may include dry mouth, dizziness or visual disturbances. °3. Avoid touching the patch. Wash your hands with soap and water after contact with the patch. °  ° °

## 2014-07-21 NOTE — Interval H&P Note (Signed)
History and Physical Interval Note:  07/21/2014 7:35 AM  Angela Abbott  has presented today for surgery, with the diagnosis of LEFT KNEE ARTHROSCOPY WITH DEBRIDEMENT SHAVING CHONDROPLASTY, WITH MENISCECTOMY LATERAL  The various methods of treatment have been discussed with the patient and family. After consideration of risks, benefits and other options for treatment, the patient has consented to  Procedure(s): LEFT KNEE ARTHROSCOPY CHONDROPLASTY, WITH LATERAL MENISCECTOMY (Left) as a surgical intervention .  The patient's history has been reviewed, patient examined, no change in status, stable for surgery.  I have reviewed the patient's chart and labs.  Questions were answered to the patient's satisfaction.     , F

## 2014-07-22 ENCOUNTER — Encounter (HOSPITAL_BASED_OUTPATIENT_CLINIC_OR_DEPARTMENT_OTHER): Payer: Self-pay | Admitting: Orthopedic Surgery

## 2014-07-22 NOTE — Op Note (Signed)
NAME:  Angela Abbott, BIRT NO.:  [id number]  MEDICAL RECORD NO.:  [id number]  LOCATION:                               FACILITY:  [facility]  PHYSICIAN:  Ninetta Lights, M.D. DATE OF BIRTH:  11-19-1960  DATE OF PROCEDURE:  07/21/2014 DATE OF DISCHARGE:  07/21/2014                              OPERATIVE REPORT   PREOPERATIVE DIAGNOSES:  Left knee lateral meniscus tear, chondromalacia patella.  POSTOPERATIVE DIAGNOSES:  Left knee lateral meniscus tear, chondromalacia patella with extensive tearing anterior two-thirds lateral meniscus.  Complex tearing posterior third medial meniscus.  Grade 2 changes lateral patella, lateral compartment.  Marked reactive synovitis.  PROCEDURE:  Left knee exam under anesthesia, arthroscopy with partial medial and lateral meniscectomy.  Chondroplasty patellofemoral joint, lateral tibial plateau.  Moderately extensive synovectomy.  SURGEON:  Ninetta Lights, M.D.  ASSISTANT:  Elmyra Ricks, PA.  ANESTHESIA:  General.  BLOOD LOSS:  Minimal.  SPECIMENS:  None.  CULTURES:  None.  COMPLICATIONS:  None.  DRESSINGS:  Soft compressive.  TOURNIQUET:  Not employed.  DESCRIPTION OF PROCEDURE:  The patient was brought to the operating room, placed on the operating table in a supine position.  After adequate anesthesia had been obtained, leg holder applied.  Leg prepped and draped in usual sterile fashion.  Two portals, one each medial and lateral parapatellar.  Arthroscope introduced, knee distended and inspected.  A lot of proliferative synovitis especially anteromedial. All of this was resected.  Grade 2 changes patella debrided.  Good tracking.  ACL intact.  Medial compartment some grade 2 changes nothing marked.  Marked complex tearing posterior third.  Taken out to a stable rim, tapered into remaining meniscus.  Laterally, there was extensive complex irreparable tearing in the anterior two-thirds lateral meniscus, some in  the posterior third.  Saucerized out removing most of the anterior half and then leaving some of the posterior half intact. Chondroplasty on the plateau.  Entire knee examined, no other findings were appreciated.  Instruments were fully removed.  Portals were closed with nylon.  Knee injected with Depo-Medrol and Marcaine.  Sterile compressive dressing applied.  Anesthesia reversed.  Brought to the recovery room.  Tolerated the surgery well.  No complications.     Ninetta Lights, M.D.   ______________________________ Ninetta Lights, M.D.    DFM/MEDQ  D:  07/21/2014  T:  07/22/2014  Job:  [id number]

## 2014-12-06 ENCOUNTER — Other Ambulatory Visit (HOSPITAL_COMMUNITY): Payer: Self-pay | Admitting: Internal Medicine

## 2014-12-06 DIAGNOSIS — M8580 Other specified disorders of bone density and structure, unspecified site: Secondary | ICD-10-CM

## 2014-12-09 ENCOUNTER — Ambulatory Visit (HOSPITAL_COMMUNITY)
Admission: RE | Admit: 2014-12-09 | Discharge: 2014-12-09 | Disposition: A | Discharge status: Home / Self Care | Payer: Commercial Managed Care - HMO | Source: Ambulatory Visit | Attending: Internal Medicine | Admitting: Internal Medicine

## 2014-12-09 DIAGNOSIS — M899 Disorder of bone, unspecified: Secondary | ICD-10-CM | POA: Insufficient documentation

## 2014-12-09 DIAGNOSIS — Z780 Asymptomatic menopausal state: Secondary | ICD-10-CM | POA: Diagnosis not present

## 2014-12-09 DIAGNOSIS — M8580 Other specified disorders of bone density and structure, unspecified site: Secondary | ICD-10-CM

## 2015-03-29 ENCOUNTER — Other Ambulatory Visit: Payer: Self-pay | Admitting: Occupational Medicine

## 2015-03-29 ENCOUNTER — Ambulatory Visit: Payer: Self-pay

## 2015-03-29 DIAGNOSIS — M25512 Pain in left shoulder: Secondary | ICD-10-CM

## 2015-04-11 ENCOUNTER — Other Ambulatory Visit: Payer: Self-pay | Admitting: Obstetrics and Gynecology

## 2015-04-11 DIAGNOSIS — R928 Other abnormal and inconclusive findings on diagnostic imaging of breast: Secondary | ICD-10-CM

## 2015-04-24 ENCOUNTER — Ambulatory Visit
Admission: RE | Admit: 2015-04-24 | Discharge: 2015-04-24 | Disposition: A | Discharge status: Home / Self Care | Payer: Commercial Managed Care - HMO | Source: Ambulatory Visit | Attending: Obstetrics and Gynecology | Admitting: Obstetrics and Gynecology

## 2015-04-24 DIAGNOSIS — R928 Other abnormal and inconclusive findings on diagnostic imaging of breast: Secondary | ICD-10-CM

## 2015-08-21 ENCOUNTER — Emergency Department (HOSPITAL_COMMUNITY)
Admission: EM | Admit: 2015-08-21 | Discharge: 2015-08-21 | Disposition: A | Discharge status: Home / Self Care | Payer: Commercial Managed Care - HMO | Attending: Emergency Medicine | Admitting: Emergency Medicine

## 2015-08-21 ENCOUNTER — Emergency Department (HOSPITAL_COMMUNITY): Payer: Commercial Managed Care - HMO

## 2015-08-21 ENCOUNTER — Encounter (HOSPITAL_COMMUNITY): Payer: Self-pay | Admitting: *Deleted

## 2015-08-21 DIAGNOSIS — Z7982 Long term (current) use of aspirin: Secondary | ICD-10-CM | POA: Insufficient documentation

## 2015-08-21 DIAGNOSIS — Y939 Activity, unspecified: Secondary | ICD-10-CM | POA: Insufficient documentation

## 2015-08-21 DIAGNOSIS — X58XXXA Exposure to other specified factors, initial encounter: Secondary | ICD-10-CM | POA: Diagnosis not present

## 2015-08-21 DIAGNOSIS — S6992XA Unspecified injury of left wrist, hand and finger(s), initial encounter: Secondary | ICD-10-CM | POA: Diagnosis not present

## 2015-08-21 DIAGNOSIS — Z79899 Other long term (current) drug therapy: Secondary | ICD-10-CM | POA: Insufficient documentation

## 2015-08-21 DIAGNOSIS — Y999 Unspecified external cause status: Secondary | ICD-10-CM | POA: Diagnosis not present

## 2015-08-21 DIAGNOSIS — Y929 Unspecified place or not applicable: Secondary | ICD-10-CM | POA: Insufficient documentation

## 2015-08-21 DIAGNOSIS — F17210 Nicotine dependence, cigarettes, uncomplicated: Secondary | ICD-10-CM | POA: Diagnosis not present

## 2015-08-21 DIAGNOSIS — E039 Hypothyroidism, unspecified: Secondary | ICD-10-CM | POA: Diagnosis not present
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
## 2015-08-21 NOTE — ED Notes (Signed)
Patient transported to X-ray 

## 2015-08-21 NOTE — Discharge Instructions (Signed)
You were seen in the emergency department for an injury to your left finger. X-ray show no fracture but your exam is concerning for an injury to your ligament. I recommend you alternate Tylenol and ibuprofen if you begin having pain in that you keep your splint on at all times and follow up with a hand surgeon. We have given you Dr. Biagio Borg information who is a Museum/gallery conservator on call for [place] in [place]. If you're unable to get to [facility] you may follow-up with one of the orthopedic physicians here in [place], Dr. Aline Brochure or Dr. Luna Glasgow.
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
## 2015-08-22 DIAGNOSIS — M20012 Mallet finger of left finger(s): Secondary | ICD-10-CM | POA: Insufficient documentation

## 2015-10-11 ENCOUNTER — Encounter (HOSPITAL_COMMUNITY): Payer: Self-pay

## 2015-11-21 ENCOUNTER — Encounter (HOSPITAL_BASED_OUTPATIENT_CLINIC_OR_DEPARTMENT_OTHER): Payer: Self-pay | Admitting: *Deleted

## 2015-11-21 NOTE — H&P (Signed)
Angela Abbott comes in for evaluation of her right knee and her right shoulder.  All of this insidious onset, getting steadily worse.  Right shoulder she cannot use overhead.  Rest pain, night pain and more and more functional impact.  She has had no workup or specific treatment of that.  Right knee increasing varus deformity.  Popping and pain, especially medially.  She does not like stair climbing or full flexion.  This was injected in July, that helped for about three months and she is wondering if we can do another shot.   Other issues reviewed.  She is status post arthroscopy of the left knee, debridement of meniscus tears and although there are underlying degenerative changes that is doing well.  She is status post injection of the greater trochanteric bursa, left hip, and that continues to do well.  She has a previous history of a left shoulder rotator cuff problem.  Treated with injection in March.  MRI and evaluation by Dr. Veverly Fells.  She was told that she has a partial cuff tear and if things don't continue to improve she is going to need to consider surgery.  I don't have those notes or that scan.  However, that shoulder, although still a little sore, is functional.  She has full motion.  A little weakness overhead.   Remaining history and general exam are reviewed.   EXAMINATION: Specifically, both hips no trochanteric tenderness.  Left knee full extension, 130 degrees of flexion.  No soreness.  Right knee varus alignment.  Varus thrust.  Motion is still 0-120 degrees.  Very sore medial joint line.  Left shoulder full active motion.  She does have some cuff weakness, although not extreme.  Some cuff irritation, again not extreme.  No instability.  Right shoulder marked cuff irritation.  Active motion is only 50% and she really can't get this overhead.  Passively it is about 80%.  There is marked irritation and cuff weakness.  AC soreness.  No instability.  No significant atrophy.    X-RAYS: X-rays are  obtained.  Right shoulder Type II acromion.  Reasonable subacromial space.  Degenerative changes AC joint.  No superior migration.  Of note, I reviewed the standing x-rays of her right knee that were obtained in July.  Marked narrowing medial compartment.  Not quite, but getting close to, bone on bone there.  More degenerative changes than she had on the left.    DISPOSITION:  1. Most pressing here is her right shoulder.  I am working her up for a rotator cuff tear.  I am holding off any further treatment until I see what she has.  She will follow up when the MRI is complete.  Tailor further treatment depending on what that shows and I went over that with her.   2. In regards to the right knee, this is increasing arthritis.  I don't think it lends itself to arthroscopic debridement.  We have talked about MRIs and we have talked about arthroscopy, but at the end of the day I think this is degenerative arthritis and is going to be best treated with either a partial or a total knee replacement.  In the interim I have agreed to re-inject this, but we are going to do it on Friday so she is out of work a couple of days after her injection.  I will then follow up with her after the scan of her shoulder is complete.    Addendum: MRI right shoulder reveals complete  tear of the rotator cuff.  We are going to proceed with surgical intervention and patient agrees.  Risks, benefits and possible complications reviewed. Rehab and recovery time discussed.  All questions answered.

## 2015-11-23 ENCOUNTER — Ambulatory Visit (HOSPITAL_BASED_OUTPATIENT_CLINIC_OR_DEPARTMENT_OTHER): Payer: Commercial Managed Care - HMO | Admitting: Anesthesiology

## 2015-11-23 ENCOUNTER — Ambulatory Visit (HOSPITAL_BASED_OUTPATIENT_CLINIC_OR_DEPARTMENT_OTHER)
Admission: RE | Admit: 2015-11-23 | Discharge: 2015-11-23 | Disposition: A | Discharge status: Home / Self Care | Payer: Commercial Managed Care - HMO | Source: Ambulatory Visit | Attending: Orthopedic Surgery | Admitting: Orthopedic Surgery

## 2015-11-23 ENCOUNTER — Encounter (HOSPITAL_BASED_OUTPATIENT_CLINIC_OR_DEPARTMENT_OTHER)
Admission: RE | Disposition: A | Discharge status: Home / Self Care | Payer: Self-pay | Source: Ambulatory Visit | Attending: Orthopedic Surgery

## 2015-11-23 ENCOUNTER — Encounter (HOSPITAL_BASED_OUTPATIENT_CLINIC_OR_DEPARTMENT_OTHER): Payer: Self-pay | Admitting: Anesthesiology

## 2015-11-23 DIAGNOSIS — M65811 Other synovitis and tenosynovitis, right shoulder: Secondary | ICD-10-CM | POA: Insufficient documentation

## 2015-11-23 DIAGNOSIS — M19011 Primary osteoarthritis, right shoulder: Secondary | ICD-10-CM | POA: Diagnosis not present

## 2015-11-23 DIAGNOSIS — E039 Hypothyroidism, unspecified: Secondary | ICD-10-CM | POA: Insufficient documentation

## 2015-11-23 DIAGNOSIS — F17210 Nicotine dependence, cigarettes, uncomplicated: Secondary | ICD-10-CM | POA: Diagnosis not present

## 2015-11-23 DIAGNOSIS — M7541 Impingement syndrome of right shoulder: Secondary | ICD-10-CM | POA: Insufficient documentation

## 2015-11-23 DIAGNOSIS — M7501 Adhesive capsulitis of right shoulder: Secondary | ICD-10-CM | POA: Insufficient documentation

## 2015-11-23 DIAGNOSIS — K219 Gastro-esophageal reflux disease without esophagitis: Secondary | ICD-10-CM | POA: Diagnosis not present

## 2015-11-23 DIAGNOSIS — M75121 Complete rotator cuff tear or rupture of right shoulder, not specified as traumatic: Secondary | ICD-10-CM | POA: Insufficient documentation

## 2015-11-23 HISTORY — PX: SHOULDER ARTHROSCOPY WITH SUBACROMIAL DECOMPRESSION, ROTATOR CUFF REPAIR AND BICEP TENDON REPAIR: SHX5687

## 2015-11-23 SURGERY — SHOULDER ARTHROSCOPY WITH SUBACROMIAL DECOMPRESSION, ROTATOR CUFF REPAIR AND BICEP TENDON REPAIR
Anesthesia: General | Site: Shoulder | Laterality: Right

## 2015-11-23 MED ORDER — CHLORHEXIDINE GLUCONATE 4 % EX LIQD: 60.0000 mL | Freq: Once | CUTANEOUS | Status: DC

## 2015-11-23 MED ORDER — ONDANSETRON HCL 4 MG PO TABS: 4.0000 mg | ORAL_TABLET | Freq: Three times a day (TID) | ORAL | 0 refills | Status: DC | PRN

## 2015-11-23 MED ORDER — MIDAZOLAM HCL 2 MG/2ML IJ SOLN
INTRAMUSCULAR | Status: AC
  Filled 2015-11-23: qty 2

## 2015-11-23 MED ORDER — CEFAZOLIN SODIUM-DEXTROSE 2-4 GM/100ML-% IV SOLN: 2.0000 g | INTRAVENOUS | Status: DC

## 2015-11-23 MED ORDER — MIDAZOLAM HCL 2 MG/2ML IJ SOLN
1.0000 mg | INTRAMUSCULAR | Status: DC | PRN
  Administered 2015-11-23: 1 mg via INTRAVENOUS

## 2015-11-23 MED ORDER — LIDOCAINE 2% (20 MG/ML) 5 ML SYRINGE
INTRAMUSCULAR | Status: AC
  Filled 2015-11-23: qty 5

## 2015-11-23 MED ORDER — FENTANYL CITRATE (PF) 100 MCG/2ML IJ SOLN: 25.0000 ug | INTRAMUSCULAR | Status: DC | PRN

## 2015-11-23 MED ORDER — FENTANYL CITRATE (PF) 100 MCG/2ML IJ SOLN
INTRAMUSCULAR | Status: AC
  Filled 2015-11-23: qty 2

## 2015-11-23 MED ORDER — FENTANYL CITRATE (PF) 100 MCG/2ML IJ SOLN
INTRAMUSCULAR | Status: DC | PRN
  Administered 2015-11-23 (×2): 50 ug via INTRAVENOUS

## 2015-11-23 MED ORDER — ONDANSETRON HCL 4 MG/2ML IJ SOLN
INTRAMUSCULAR | Status: AC
  Filled 2015-11-23: qty 2

## 2015-11-23 MED ORDER — SUCCINYLCHOLINE CHLORIDE 20 MG/ML IJ SOLN
INTRAMUSCULAR | Status: DC | PRN
  Administered 2015-11-23: 120 mg via INTRAVENOUS

## 2015-11-23 MED ORDER — LABETALOL HCL 5 MG/ML IV SOLN
INTRAVENOUS | Status: DC | PRN
  Administered 2015-11-23: 2.5 mg via INTRAVENOUS

## 2015-11-23 MED ORDER — MEPERIDINE HCL 25 MG/ML IJ SOLN: 6.2500 mg | INTRAMUSCULAR | Status: DC | PRN

## 2015-11-23 MED ORDER — LIDOCAINE HCL (CARDIAC) 20 MG/ML IV SOLN
INTRAVENOUS | Status: DC | PRN
  Administered 2015-11-23: 50 mg via INTRAVENOUS

## 2015-11-23 MED ORDER — FENTANYL CITRATE (PF) 100 MCG/2ML IJ SOLN
50.0000 ug | INTRAMUSCULAR | Status: DC | PRN
  Administered 2015-11-23: 50 ug via INTRAVENOUS

## 2015-11-23 MED ORDER — CEFAZOLIN SODIUM-DEXTROSE 2-4 GM/100ML-% IV SOLN
INTRAVENOUS | Status: AC
  Filled 2015-11-23: qty 100

## 2015-11-23 MED ORDER — PROMETHAZINE HCL 25 MG/ML IJ SOLN: 6.2500 mg | INTRAMUSCULAR | Status: DC | PRN

## 2015-11-23 MED ORDER — LACTATED RINGERS IV SOLN
INTRAVENOUS | Status: DC
  Administered 2015-11-23: 10 mL/h via INTRAVENOUS

## 2015-11-23 MED ORDER — DEXAMETHASONE SODIUM PHOSPHATE 10 MG/ML IJ SOLN
INTRAMUSCULAR | Status: AC
  Filled 2015-11-23: qty 1

## 2015-11-23 MED ORDER — LABETALOL HCL 5 MG/ML IV SOLN
INTRAVENOUS | Status: AC
  Filled 2015-11-23: qty 4

## 2015-11-23 MED ORDER — BUPIVACAINE-EPINEPHRINE (PF) 0.5% -1:200000 IJ SOLN
INTRAMUSCULAR | Status: DC | PRN
  Administered 2015-11-23: 15 mL via PERINEURAL

## 2015-11-23 MED ORDER — DEXAMETHASONE SODIUM PHOSPHATE 4 MG/ML IJ SOLN
INTRAMUSCULAR | Status: DC | PRN
  Administered 2015-11-23: 10 mg via INTRAVENOUS

## 2015-11-23 MED ORDER — SCOPOLAMINE 1 MG/3DAYS TD PT72: 1.0000 | MEDICATED_PATCH | Freq: Once | TRANSDERMAL | Status: DC | PRN

## 2015-11-23 MED ORDER — LACTATED RINGERS IV SOLN: INTRAVENOUS | Status: DC

## 2015-11-23 MED ORDER — SODIUM CHLORIDE 0.9 % IR SOLN
Status: DC | PRN
  Administered 2015-11-23: 9000 mL

## 2015-11-23 MED ORDER — OXYCODONE HCL 5 MG/5ML PO SOLN: 5.0000 mg | Freq: Once | ORAL | Status: DC | PRN

## 2015-11-23 MED ORDER — LACTATED RINGERS IV SOLN
INTRAVENOUS | Status: DC
  Administered 2015-11-23: 08:00:00 via INTRAVENOUS

## 2015-11-23 MED ORDER — PROPOFOL 10 MG/ML IV BOLUS
INTRAVENOUS | Status: DC | PRN
  Administered 2015-11-23: 180 mg via INTRAVENOUS

## 2015-11-23 MED ORDER — SUCCINYLCHOLINE CHLORIDE 200 MG/10ML IV SOSY
PREFILLED_SYRINGE | INTRAVENOUS | Status: AC
  Filled 2015-11-23: qty 10

## 2015-11-23 MED ORDER — OXYCODONE HCL 5 MG PO TABS: 5.0000 mg | ORAL_TABLET | Freq: Once | ORAL | Status: DC | PRN

## 2015-11-23 SURGICAL SUPPLY — 75 items
AID PSTN UNV HD RSTRNT DISP (MISCELLANEOUS) ×1
ANCH SUT SWLK 19.1X4.75 (Anchor) ×2 IMPLANT
ANCHOR SUT BIO SW 4.75X19.1 (Anchor) ×2 IMPLANT
APL SKNCLS STERI-STRIP NONHPOA (GAUZE/BANDAGES/DRESSINGS)
BENZOIN TINCTURE PRP APPL 2/3 (GAUZE/BANDAGES/DRESSINGS) IMPLANT
BLADE CUTTER GATOR 3.5 (BLADE) ×2 IMPLANT
BLADE CUTTER MENIS 5.5 (BLADE) IMPLANT
BLADE GREAT WHITE 4.2 (BLADE) ×2 IMPLANT
BLADE SURG 15 STRL LF DISP TIS (BLADE) ×1 IMPLANT
BLADE SURG 15 STRL SS (BLADE) ×2
BLADE VORTEX 6.0 (BLADE) ×2 IMPLANT
BUR OVAL 6.0 (BURR) ×2 IMPLANT
CANNULA DRY DOC 8X75 (CANNULA) IMPLANT
CANNULA TWIST IN 8.25X7CM (CANNULA) ×1 IMPLANT
DECANTER SPIKE VIAL GLASS SM (MISCELLANEOUS) IMPLANT
DRAPE STERI 35X30 U-POUCH (DRAPES) ×2 IMPLANT
DRAPE U-SHAPE 47X51 STRL (DRAPES) ×2 IMPLANT
DRAPE U-SHAPE 76X120 STRL (DRAPES) ×4 IMPLANT
DRSG PAD ABDOMINAL 8X10 ST (GAUZE/BANDAGES/DRESSINGS) ×2 IMPLANT
DURAPREP 26ML APPLICATOR (WOUND CARE) ×2 IMPLANT
ELECT MENISCUS 165MM 90D (ELECTRODE) ×2 IMPLANT
ELECT REM PT RETURN 9FT ADLT (ELECTROSURGICAL) ×2
ELECTRODE REM PT RTRN 9FT ADLT (ELECTROSURGICAL) ×1 IMPLANT
GAUZE SPONGE 4X4 12PLY STRL (GAUZE/BANDAGES/DRESSINGS) ×4 IMPLANT
GAUZE XEROFORM 1X8 LF (GAUZE/BANDAGES/DRESSINGS) ×2 IMPLANT
GLOVE BIOGEL PI IND STRL 7.0 (GLOVE) ×1 IMPLANT
GLOVE BIOGEL PI INDICATOR 7.0 (GLOVE) ×3
GLOVE ECLIPSE 7.0 STRL STRAW (GLOVE) ×2 IMPLANT
GLOVE SURG ORTHO 8.0 STRL STRW (GLOVE) ×2 IMPLANT
GOWN STRL REUS W/ TWL LRG LVL3 (GOWN DISPOSABLE) ×1 IMPLANT
GOWN STRL REUS W/ TWL XL LVL3 (GOWN DISPOSABLE) ×2 IMPLANT
GOWN STRL REUS W/TWL LRG LVL3 (GOWN DISPOSABLE) ×2
GOWN STRL REUS W/TWL XL LVL3 (GOWN DISPOSABLE) ×4
IV NS IRRIG 3000ML ARTHROMATIC (IV SOLUTION) ×8 IMPLANT
MANIFOLD NEPTUNE II (INSTRUMENTS) ×2 IMPLANT
NDL SCORPION MULTI FIRE (NEEDLE) IMPLANT
NDL SUT 6 .5 CRC .975X.05 MAYO (NEEDLE) IMPLANT
NEEDLE MAYO TAPER (NEEDLE)
NEEDLE SCORPION MULTI FIRE (NEEDLE) ×2 IMPLANT
NS IRRIG 1000ML POUR BTL (IV SOLUTION) IMPLANT
PACK ARTHROSCOPY DSU (CUSTOM PROCEDURE TRAY) ×2 IMPLANT
PASSER SUT SWANSON 36MM LOOP (INSTRUMENTS) IMPLANT
PENCIL BUTTON HOLSTER BLD 10FT (ELECTRODE) ×2 IMPLANT
RESTRAINT HEAD UNIVERSAL NS (MISCELLANEOUS) ×2 IMPLANT
SET ARTHROSCOPY TUBING (MISCELLANEOUS) ×2
SET ARTHROSCOPY TUBING LN (MISCELLANEOUS) ×1 IMPLANT
SLEEVE SCD COMPRESS KNEE MED (MISCELLANEOUS) IMPLANT
SLING ARM FOAM STRAP LRG (SOFTGOODS) IMPLANT
SLING ARM IMMOBILIZER LRG (SOFTGOODS) ×3 IMPLANT
SLING ARM IMMOBILIZER MED (SOFTGOODS) IMPLANT
SLING ARM MED ADULT FOAM STRAP (SOFTGOODS) IMPLANT
SLING ARM XL FOAM STRAP (SOFTGOODS) IMPLANT
SPONGE GAUZE 4X4 12PLY STER LF (GAUZE/BANDAGES/DRESSINGS) ×1 IMPLANT
SPONGE LAP 4X18 X RAY DECT (DISPOSABLE) IMPLANT
STRIP CLOSURE SKIN 1/2X4 (GAUZE/BANDAGES/DRESSINGS) IMPLANT
SUCTION FRAZIER HANDLE 10FR (MISCELLANEOUS)
SUCTION TUBE FRAZIER 10FR DISP (MISCELLANEOUS) IMPLANT
SUT ETHIBOND 2 OS 4 DA (SUTURE) IMPLANT
SUT ETHILON 2 0 FS 18 (SUTURE) IMPLANT
SUT ETHILON 3 0 PS 1 (SUTURE) IMPLANT
SUT FIBERWIRE #2 38 T-5 BLUE (SUTURE)
SUT RETRIEVER MED (INSTRUMENTS) IMPLANT
SUT TIGER TAPE 7 IN WHITE (SUTURE) ×1 IMPLANT
SUT VIC AB 0 CT1 27 (SUTURE)
SUT VIC AB 0 CT1 27XBRD ANBCTR (SUTURE) IMPLANT
SUT VIC AB 2-0 SH 27 (SUTURE)
SUT VIC AB 2-0 SH 27XBRD (SUTURE) IMPLANT
SUT VIC AB 3-0 FS2 27 (SUTURE) IMPLANT
SUTURE FIBERWR #2 38 T-5 BLUE (SUTURE) IMPLANT
TAPE CLOTH SURG 6X10 WHT LF (GAUZE/BANDAGES/DRESSINGS) ×2 IMPLANT
TAPE FIBER 2MM 7IN #2 BLUE (SUTURE) ×1 IMPLANT
TOWEL OR 17X24 6PK STRL BLUE (TOWEL DISPOSABLE) ×2 IMPLANT
TOWEL OR NON WOVEN STRL DISP B (DISPOSABLE) ×2 IMPLANT
WATER STERILE IRR 1000ML POUR (IV SOLUTION) ×2 IMPLANT
YANKAUER SUCT BULB TIP NO VENT (SUCTIONS) IMPLANT

## 2015-11-23 NOTE — Anesthesia Preprocedure Evaluation (Signed)
Anesthesia Evaluation  Patient identified by MRN, date of birth, ID band Patient awake    Reviewed: Allergy & Precautions, NPO status , Patient's Chart, lab work & pertinent test results  Airway Mallampati: II  TM Distance: >3 FB Neck ROM: Full    Dental  (+) Partial Lower, Partial Upper   Pulmonary neg pulmonary ROS, Current Smoker,    breath sounds clear to auscultation       Cardiovascular negative cardio ROS   Rhythm:Regular Rate:Normal     Neuro/Psych  Neuromuscular disease negative psych ROS   GI/Hepatic Neg liver ROS, GERD  Medicated,  Endo/Other  Hypothyroidism   Renal/GU negative Renal ROS  negative genitourinary   Musculoskeletal  (+) Arthritis , Osteoarthritis,    Abdominal   Peds negative pediatric ROS (+)  Hematology negative hematology ROS (+)   Anesthesia Other Findings   Reproductive/Obstetrics negative OB ROS                             Lab Results  Component Value Date   WBC 7.6 07/07/2013   HGB 14.2 07/21/2014   HCT 49.4 (H) 07/07/2013   MCV 85.3 07/07/2013   PLT 223 07/07/2013   Lab Results  Component Value Date   CREATININE 0.85 07/07/2013   BUN 20 07/07/2013   NA 140 07/07/2013   K 4.1 07/07/2013   CL 100 07/07/2013   CO2 23 07/07/2013   Lab Results  Component Value Date   INR 0.9 RATIO 03/10/2007      Anesthesia Physical Anesthesia Plan  ASA: II  Anesthesia Plan: General   Post-op Pain Management: GA combined w/ Regional for post-op pain   Induction: Intravenous  Airway Management Planned: Oral ETT  Additional Equipment:   Intra-op Plan:   Post-operative Plan: Extubation in OR  Informed Consent: I have reviewed the patients History and Physical, chart, labs and discussed the procedure including the risks, benefits and alternatives for the proposed anesthesia with the patient or authorized representative who has indicated his/her  understanding and acceptance.   Dental advisory given  Plan Discussed with: CRNA  Anesthesia Plan Comments:         Anesthesia Quick Evaluation

## 2015-11-23 NOTE — Progress Notes (Signed)
AssistedDr. Hollis with right, ultrasound guided, interscalene  block. Side rails up, monitors on throughout procedure. See vital signs in flow sheet. Tolerated Procedure well.  

## 2015-11-23 NOTE — Anesthesia Postprocedure Evaluation (Signed)
Anesthesia Post Note  Patient: KEMBA LEEDOM  Procedure(s) Performed: Procedure(s) (LRB): RIGHT SHOULDER ARTHROSCOPY WITH DEBRIDEMENT, SUBACROMIAL DECOMPRESSION, DISTAL CLAVICLE EXCISION, ROTATOR CUFF REPAIR AND BICEP TENODESIS (Right)  Patient location during evaluation: PACU Anesthesia Type: General and Regional Level of consciousness: awake and alert Pain management: pain level controlled Vital Signs Assessment: post-procedure vital signs reviewed and stable Respiratory status: spontaneous breathing, nonlabored ventilation, respiratory function stable and patient connected to nasal cannula oxygen Cardiovascular status: blood pressure returned to baseline and stable Postop Assessment: no signs of nausea or vomiting Anesthetic complications: no    Last Vitals:  Vitals:   11/23/15 1148 11/23/15 1224  BP:  (!) 163/95  Pulse: 60 (!) 59  Resp: (!) 23 20  Temp:  36.4 C    Last Pain:  Vitals:   11/23/15 1224  TempSrc:   PainSc: 0-No pain                 Angela Abbott

## 2015-11-23 NOTE — Interval H&P Note (Signed)
History and Physical Interval Note:  11/23/2015 9:24 AM  Angela Abbott  has presented today for surgery, with the diagnosis of other articular disorders, right shoulder, primary osteoarthritis, right shoulder, bursitis of right shoulder, complete rotator cuff tear or ruptire of right shoulder, not specified  The various methods of treatment have been discussed with the patient and family. After consideration of risks, benefits and other options for treatment, the patient has consented to  Procedure(s): RIGHT SHOULDER ARTHROSCOPY WITH DEBRIDEMENT, SUBACROMIAL DECOMPRESSION, DISTAL CLAVICLE EXCISION, ROTATOR CUFF REPAIR AND BICEP TENODESIS (Right) as a surgical intervention .  The patient's history has been reviewed, patient examined, no change in status, stable for surgery.  I have reviewed the patient's chart and labs.  Questions were answered to the patient's satisfaction.     Ninetta Lights

## 2015-11-23 NOTE — Anesthesia Procedure Notes (Signed)
Anesthesia Regional Block:  Interscalene brachial plexus block  Pre-Anesthetic Checklist: ,, timeout performed, Correct Patient, Correct Site, Correct Laterality, Correct Procedure, Correct Position, site marked, Risks and benefits discussed,  Surgical consent,  Pre-op evaluation,  At surgeon's request and post-op pain management  Laterality: Right  Prep: chloraprep       Needles:  Injection technique: Single-shot  Needle Type: Echogenic Needle     Needle Length: 9cm 9 cm Needle Gauge: 21 and 21 G    Additional Needles:  Procedures: ultrasound guided (picture in chart) Interscalene brachial plexus block Narrative:  Start time: 11/23/2015 9:00 AM End time: 11/23/2015 9:03 AM Injection made incrementally with aspirations every 5 mL.  Performed by: Personally  Anesthesiologist: Suella Broad D  Additional Notes: Pt tolerated well. No complications.

## 2015-11-23 NOTE — Transfer of Care (Signed)
Immediate Anesthesia Transfer of Care Note  Patient: Angela Abbott  Procedure(s) Performed: Procedure(s): RIGHT SHOULDER ARTHROSCOPY WITH DEBRIDEMENT, SUBACROMIAL DECOMPRESSION, DISTAL CLAVICLE EXCISION, ROTATOR CUFF REPAIR AND BICEP TENODESIS (Right)  Patient Location: PACU  Anesthesia Type:General  Level of Consciousness: awake and sedated  Airway & Oxygen Therapy: Patient Spontanous Breathing and Patient connected to face mask oxygen  Post-op Assessment: Report given to RN and Post -op Vital signs reviewed and stable  Post vital signs: Reviewed and stable  Last Vitals:  Vitals:   11/23/15 0900 11/23/15 0905  BP: (!) 179/82 (!) 147/77  Pulse: (!) 53 60  Resp: 20 (!) 26  Temp:      Last Pain:  Vitals:   11/23/15 0845  TempSrc: Oral         Complications: No apparent anesthesia complications

## 2015-11-23 NOTE — Discharge Instructions (Signed)
Shouder arthroscopy, rotator cuff repair, subacromial decompression °Care After Instructions °Refer to this sheet in the next few weeks. These discharge instructions provide you with general information on caring for yourself after you leave the hospital. Your caregiver may also give you specific instructions. Your treatment has been planned according to the most current medical practices available, but unavoidable complications sometimes occur. If you have any problems or questions after discharge, please call your caregiver. °HOME INSTRUCTIONS °You may resume a normal diet and activities as directed.  °Take showers instead of baths until informed otherwise.  °Change bandages (dressings) in 3 days.  Swab wounds daily with betadine.  Wash shoulder with soap and water.  Pat dry.  Cover wounds with bandaids. °Only take over-the-counter or prescription medicines for pain, discomfort, or fever as directed by your caregiver.  °Wear your sling for the next 6 weeks unless otherwise instructed. °Eat a well-balanced diet.  °Avoid lifting or driving until you are instructed otherwise.  °Make an appointment to see your caregiver for stitches (suture) or staple removal one week after surgery.  ° °SEEK MEDICAL CARE IF: °You have swelling of your calf or leg.  °You develop shortness of breath or chest pain.  °You have redness, swelling, or increasing pain in the wound.  °There is pus or any unusual drainage coming from the surgical site.  °You notice a bad smell coming from the surgical site or dressing.  °The surgical site breaks open after sutures or staples have been removed.  °There is persistent bleeding from the suture or staple line.  °You are getting worse or are not improving.  °You have any other questions or concerns.  °SEEK IMMEDIATE MEDICAL CARE IF:  °You have a fever greater than 101 °You develop a rash.  °You have difficulty breathing.  °You develop any reaction or side effects to medicines given.  °Your knee  motion is decreasing rather than improving.  °MAKE SURE YOU:  °Understand these instructions.  °Will watch your condition.  °Will get help right away if you are not doing well or get worse.  ° ° °Post Anesthesia Home Care Instructions ° °Activity: °Get plenty of rest for the remainder of the day. A responsible adult should stay with you for 24 hours following the procedure.  °For the next 24 hours, DO NOT: °-Drive a car °-Operate machinery °-Drink alcoholic beverages °-Take any medication unless instructed by your physician °-Make any legal decisions or sign important papers. ° °Meals: °Start with liquid foods such as gelatin or soup. Progress to regular foods as tolerated. Avoid greasy, spicy, heavy foods. If nausea and/or vomiting occur, drink only clear liquids until the nausea and/or vomiting subsides. Call your physician if vomiting continues. ° °Special Instructions/Symptoms: °Your throat may feel dry or sore from the anesthesia or the breathing tube placed in your throat during surgery. If this causes discomfort, gargle with warm salt water. The discomfort should disappear within 24 hours. ° °If you had a scopolamine patch placed behind your ear for the management of post- operative nausea and/or vomiting: ° °1. The medication in the patch is effective for 72 hours, after which it should be removed.  Wrap patch in a tissue and discard in the trash. Wash hands thoroughly with soap and water. °2. You may remove the patch earlier than 72 hours if you experience unpleasant side effects which may include dry mouth, dizziness or visual disturbances. °3. Avoid touching the patch. Wash your hands with soap and water after contact with   the patch. °  °Regional Anesthesia Blocks ° °1. Numbness or the inability to move the "blocked" extremity may last from 3-48 hours after placement. The length of time depends on the medication injected and your individual response to the medication. If the numbness is not going away  after 48 hours, call your surgeon. ° °2. The extremity that is blocked will need to be protected until the numbness is gone and the  Strength has returned. Because you cannot feel it, you will need to take extra care to avoid injury. Because it may be weak, you may have difficulty moving it or using it. You may not know what position it is in without looking at it while the block is in effect. ° °3. For blocks in the legs and feet, returning to weight bearing and walking needs to be done carefully. You will need to wait until the numbness is entirely gone and the strength has returned. You should be able to move your leg and foot normally before you try and bear weight or walk. You will need someone to be with you when you first try to ensure you do not fall and possibly risk injury. ° °4. Bruising and tenderness at the needle site are common side effects and will resolve in a few days. ° °5. Persistent numbness or new problems with movement should be communicated to the surgeon or the [facility] ([phone])/ [facility] ([phone]). ° °

## 2015-11-23 NOTE — Anesthesia Procedure Notes (Signed)
Procedure Name: Intubation Performed by: ,  W Pre-anesthesia Checklist: Patient identified, Emergency Drugs available, Suction available and Patient being monitored Patient Re-evaluated:Patient Re-evaluated prior to inductionOxygen Delivery Method: Circle system utilized Preoxygenation: Pre-oxygenation with 100% oxygen Intubation Type: IV induction Ventilation: Mask ventilation without difficulty Laryngoscope Size: Miller and 2 Grade View: Grade I Tube type: Oral Tube size: 7.0 mm Number of attempts: 1 Airway Equipment and Method: Stylet and Oral airway Placement Confirmation: ETT inserted through vocal cords under direct vision,  positive ETCO2 and breath sounds checked- equal and bilateral Secured at: 23 cm Tube secured with: Tape Dental Injury: Teeth and Oropharynx as per pre-operative assessment        

## 2015-11-24 ENCOUNTER — Encounter (HOSPITAL_BASED_OUTPATIENT_CLINIC_OR_DEPARTMENT_OTHER): Payer: Self-pay | Admitting: Orthopedic Surgery

## 2015-11-24 NOTE — Op Note (Deleted)
  The note originally documented on this encounter has been moved the the encounter in which it belongs.  

## 2015-11-24 NOTE — Op Note (Signed)
NAME:  Angela Abbott, Angela Abbott NO.:  [id number]  MEDICAL RECORD NO.:  [id number]  LOCATION:                                 FACILITY:  PHYSICIAN:  Ninetta Lights, M.D. DATE OF BIRTH:  05-Jan-1961  DATE OF PROCEDURE:  11/23/2015 DATE OF DISCHARGE:                              OPERATIVE REPORT   PREOPERATIVE DIAGNOSIS:  Right shoulder complete retracted rotator cuff tear supraspinatus tendon.  Impingement.  Degenerative joint disease acromioclavicular joint.  POSTOPERATIVE DIAGNOSIS:  Right shoulder complete retracted rotator cuff tear supraspinatus tendon. Impingement.  Degenerative joint disease acromioclavicular joint with also secondary adhesive capsulitis lacking 20% of full motion.  PROCEDURE:  Right shoulder exam under anesthesia with manipulation. Arthroscopy.  Lysis, debridement of adhesions, debridement of rotator cuff.  Bursectomy, acromioplasty, coracoacromial ligament release. Excision of distal clavicle.  Arthroscopic-assisted rotator cuff repair, FiberWire suture x2, SwiveLock anchors x2.  SURGEON:  Ninetta Lights, MD  ASSISTANT:  Elmyra Ricks PA, present throughout the entire case, necessary for timely completion of procedure.  ANESTHESIA:  General.  BLOOD LOSS:  Minimal.  SPECIMENS:  None.  CULTURES:  None.  COMPLICATIONS:  None.  DRESSINGS:  Soft compressive shoulder immobilizer.  PROCEDURE IN DETAIL:  The patient was brought to operating room, placed on the operating table in supine position.  After adequate anesthesia had been obtained, shoulder examined.  Lacking 20% of motion. Manipulated achieving full motion stable shoulder without adverse occurrence.  Placed in the beach-chair position on the shoulder positioner, prepped and draped in usual sterile fashion.  Three portals anterior, posterior, and lateral.  Arthroscope introduced, shoulder distended and inspected.  Residual synovitis adhesions debrided. Rotator cuff  debrided, mobilized above and below.  Articular cartilage, labrum biceps tendon, biceps anchor intact.  After debriding and mobilizing the cuff, this could be brought out laterally sufficiently. Prior to that, I did a bursectomy and then acromioplasty from a type 2 to type 1 acromion releasing the CA ligament.  Grade 4 changes, AC joint.  Periarticular spurs, lateral centimeter of clavicle resected. Adequacy of decompression confirmed viewing from all portals.  Through the lateral portal, I then placed a cannula.  With the Scorpion device, the mobilized cuff was captured with 2 horizontal mattress sutures. These were then firmly anchored down into the tuberosity with 2 SwiveLock anchors.  At completion, I was pleased with nice firm watertight closure and decompression.  Instruments and fluid removed. Portals closed with nylon.  Sterile compressive dressing applied. Shoulder immobilizer applied.  Anesthesia reversed.  Brought to recovery room.  Tolerated surgery well.  No complications.     Ninetta Lights, M.D.     DFM/MEDQ  D:  11/23/2015  T:  11/24/2015  Job:  [id number]

## 2016-01-11 DIAGNOSIS — M75101 Unspecified rotator cuff tear or rupture of right shoulder, not specified as traumatic: Secondary | ICD-10-CM | POA: Diagnosis not present

## 2016-01-11 DIAGNOSIS — M25611 Stiffness of right shoulder, not elsewhere classified: Secondary | ICD-10-CM | POA: Diagnosis not present

## 2016-01-11 DIAGNOSIS — M25511 Pain in right shoulder: Secondary | ICD-10-CM | POA: Diagnosis not present

## 2016-01-16 DIAGNOSIS — M25511 Pain in right shoulder: Secondary | ICD-10-CM | POA: Diagnosis not present

## 2016-01-16 DIAGNOSIS — M75101 Unspecified rotator cuff tear or rupture of right shoulder, not specified as traumatic: Secondary | ICD-10-CM | POA: Diagnosis not present

## 2016-01-16 DIAGNOSIS — M25611 Stiffness of right shoulder, not elsewhere classified: Secondary | ICD-10-CM | POA: Diagnosis not present

## 2016-01-19 DIAGNOSIS — M25611 Stiffness of right shoulder, not elsewhere classified: Secondary | ICD-10-CM | POA: Diagnosis not present

## 2016-01-19 DIAGNOSIS — M75101 Unspecified rotator cuff tear or rupture of right shoulder, not specified as traumatic: Secondary | ICD-10-CM | POA: Diagnosis not present

## 2016-01-19 DIAGNOSIS — M25511 Pain in right shoulder: Secondary | ICD-10-CM | POA: Diagnosis not present

## 2016-01-23 DIAGNOSIS — M25611 Stiffness of right shoulder, not elsewhere classified: Secondary | ICD-10-CM | POA: Diagnosis not present

## 2016-01-23 DIAGNOSIS — M25511 Pain in right shoulder: Secondary | ICD-10-CM | POA: Diagnosis not present

## 2016-01-23 DIAGNOSIS — M25512 Pain in left shoulder: Secondary | ICD-10-CM | POA: Diagnosis not present

## 2016-01-23 DIAGNOSIS — R531 Weakness: Secondary | ICD-10-CM | POA: Diagnosis not present

## 2016-01-30 DIAGNOSIS — M75101 Unspecified rotator cuff tear or rupture of right shoulder, not specified as traumatic: Secondary | ICD-10-CM | POA: Diagnosis not present

## 2016-01-30 DIAGNOSIS — M25511 Pain in right shoulder: Secondary | ICD-10-CM | POA: Diagnosis not present

## 2016-01-30 DIAGNOSIS — M25611 Stiffness of right shoulder, not elsewhere classified: Secondary | ICD-10-CM | POA: Diagnosis not present

## 2016-02-02 DIAGNOSIS — M25611 Stiffness of right shoulder, not elsewhere classified: Secondary | ICD-10-CM | POA: Diagnosis not present

## 2016-02-02 DIAGNOSIS — R531 Weakness: Secondary | ICD-10-CM | POA: Diagnosis not present

## 2016-02-02 DIAGNOSIS — M25511 Pain in right shoulder: Secondary | ICD-10-CM | POA: Diagnosis not present

## 2016-02-06 DIAGNOSIS — M25511 Pain in right shoulder: Secondary | ICD-10-CM | POA: Diagnosis not present

## 2016-02-06 DIAGNOSIS — M75101 Unspecified rotator cuff tear or rupture of right shoulder, not specified as traumatic: Secondary | ICD-10-CM | POA: Diagnosis not present

## 2016-02-06 DIAGNOSIS — M25611 Stiffness of right shoulder, not elsewhere classified: Secondary | ICD-10-CM | POA: Diagnosis not present

## 2016-02-09 DIAGNOSIS — E890 Postprocedural hypothyroidism: Secondary | ICD-10-CM | POA: Diagnosis not present

## 2016-02-09 DIAGNOSIS — M75101 Unspecified rotator cuff tear or rupture of right shoulder, not specified as traumatic: Secondary | ICD-10-CM | POA: Diagnosis not present

## 2016-02-09 DIAGNOSIS — M25511 Pain in right shoulder: Secondary | ICD-10-CM | POA: Diagnosis not present

## 2016-02-09 DIAGNOSIS — M25611 Stiffness of right shoulder, not elsewhere classified: Secondary | ICD-10-CM | POA: Diagnosis not present

## 2016-02-14 DIAGNOSIS — M75101 Unspecified rotator cuff tear or rupture of right shoulder, not specified as traumatic: Secondary | ICD-10-CM | POA: Diagnosis not present

## 2016-02-14 DIAGNOSIS — M25511 Pain in right shoulder: Secondary | ICD-10-CM | POA: Diagnosis not present

## 2016-02-14 DIAGNOSIS — M25611 Stiffness of right shoulder, not elsewhere classified: Secondary | ICD-10-CM | POA: Diagnosis not present

## 2016-02-19 DIAGNOSIS — M19212 Secondary osteoarthritis, left shoulder: Secondary | ICD-10-CM | POA: Diagnosis not present

## 2016-02-19 DIAGNOSIS — M24112 Other articular cartilage disorders, left shoulder: Secondary | ICD-10-CM | POA: Diagnosis not present

## 2016-02-19 DIAGNOSIS — M7542 Impingement syndrome of left shoulder: Secondary | ICD-10-CM | POA: Diagnosis not present

## 2016-02-19 DIAGNOSIS — M75112 Incomplete rotator cuff tear or rupture of left shoulder, not specified as traumatic: Secondary | ICD-10-CM | POA: Diagnosis not present

## 2016-02-19 DIAGNOSIS — S46012A Strain of muscle(s) and tendon(s) of the rotator cuff of left shoulder, initial encounter: Secondary | ICD-10-CM | POA: Diagnosis not present

## 2016-02-19 DIAGNOSIS — G8918 Other acute postprocedural pain: Secondary | ICD-10-CM | POA: Diagnosis not present

## 2016-02-19 DIAGNOSIS — M19012 Primary osteoarthritis, left shoulder: Secondary | ICD-10-CM | POA: Diagnosis not present

## 2016-02-27 DIAGNOSIS — M19012 Primary osteoarthritis, left shoulder: Secondary | ICD-10-CM | POA: Diagnosis not present

## 2016-03-01 DIAGNOSIS — M6281 Muscle weakness (generalized): Secondary | ICD-10-CM | POA: Diagnosis not present

## 2016-03-01 DIAGNOSIS — M25612 Stiffness of left shoulder, not elsewhere classified: Secondary | ICD-10-CM | POA: Diagnosis not present

## 2016-03-01 DIAGNOSIS — M25512 Pain in left shoulder: Secondary | ICD-10-CM | POA: Diagnosis not present

## 2016-03-05 DIAGNOSIS — M25611 Stiffness of right shoulder, not elsewhere classified: Secondary | ICD-10-CM | POA: Diagnosis not present

## 2016-03-05 DIAGNOSIS — M25511 Pain in right shoulder: Secondary | ICD-10-CM | POA: Diagnosis not present

## 2016-03-05 DIAGNOSIS — M75101 Unspecified rotator cuff tear or rupture of right shoulder, not specified as traumatic: Secondary | ICD-10-CM | POA: Diagnosis not present

## 2016-03-08 DIAGNOSIS — M75101 Unspecified rotator cuff tear or rupture of right shoulder, not specified as traumatic: Secondary | ICD-10-CM | POA: Diagnosis not present

## 2016-03-08 DIAGNOSIS — M25611 Stiffness of right shoulder, not elsewhere classified: Secondary | ICD-10-CM | POA: Diagnosis not present

## 2016-03-08 DIAGNOSIS — M25511 Pain in right shoulder: Secondary | ICD-10-CM | POA: Diagnosis not present

## 2016-03-12 DIAGNOSIS — M25511 Pain in right shoulder: Secondary | ICD-10-CM | POA: Diagnosis not present

## 2016-03-12 DIAGNOSIS — M25611 Stiffness of right shoulder, not elsewhere classified: Secondary | ICD-10-CM | POA: Diagnosis not present

## 2016-03-12 DIAGNOSIS — M75101 Unspecified rotator cuff tear or rupture of right shoulder, not specified as traumatic: Secondary | ICD-10-CM | POA: Diagnosis not present

## 2016-03-15 DIAGNOSIS — M25611 Stiffness of right shoulder, not elsewhere classified: Secondary | ICD-10-CM | POA: Diagnosis not present

## 2016-03-15 DIAGNOSIS — M25511 Pain in right shoulder: Secondary | ICD-10-CM | POA: Diagnosis not present

## 2016-03-15 DIAGNOSIS — M75101 Unspecified rotator cuff tear or rupture of right shoulder, not specified as traumatic: Secondary | ICD-10-CM | POA: Diagnosis not present

## 2016-03-22 DIAGNOSIS — M25512 Pain in left shoulder: Secondary | ICD-10-CM | POA: Diagnosis not present

## 2016-03-22 DIAGNOSIS — M75101 Unspecified rotator cuff tear or rupture of right shoulder, not specified as traumatic: Secondary | ICD-10-CM | POA: Diagnosis not present

## 2016-03-22 DIAGNOSIS — M25511 Pain in right shoulder: Secondary | ICD-10-CM | POA: Diagnosis not present

## 2016-03-22 DIAGNOSIS — M25611 Stiffness of right shoulder, not elsewhere classified: Secondary | ICD-10-CM | POA: Diagnosis not present

## 2016-03-22 DIAGNOSIS — M25612 Stiffness of left shoulder, not elsewhere classified: Secondary | ICD-10-CM | POA: Diagnosis not present

## 2016-03-22 DIAGNOSIS — M6281 Muscle weakness (generalized): Secondary | ICD-10-CM | POA: Diagnosis not present

## 2016-03-29 DIAGNOSIS — M25611 Stiffness of right shoulder, not elsewhere classified: Secondary | ICD-10-CM | POA: Diagnosis not present

## 2016-03-29 DIAGNOSIS — M25512 Pain in left shoulder: Secondary | ICD-10-CM | POA: Diagnosis not present

## 2016-03-29 DIAGNOSIS — M25511 Pain in right shoulder: Secondary | ICD-10-CM | POA: Diagnosis not present

## 2016-04-02 DIAGNOSIS — M25511 Pain in right shoulder: Secondary | ICD-10-CM | POA: Diagnosis not present

## 2016-04-02 DIAGNOSIS — M25611 Stiffness of right shoulder, not elsewhere classified: Secondary | ICD-10-CM | POA: Diagnosis not present

## 2016-04-02 DIAGNOSIS — M75101 Unspecified rotator cuff tear or rupture of right shoulder, not specified as traumatic: Secondary | ICD-10-CM | POA: Diagnosis not present

## 2016-04-04 DIAGNOSIS — M25612 Stiffness of left shoulder, not elsewhere classified: Secondary | ICD-10-CM | POA: Diagnosis not present

## 2016-04-04 DIAGNOSIS — M25611 Stiffness of right shoulder, not elsewhere classified: Secondary | ICD-10-CM | POA: Diagnosis not present

## 2016-04-04 DIAGNOSIS — M25511 Pain in right shoulder: Secondary | ICD-10-CM | POA: Diagnosis not present

## 2016-04-08 ENCOUNTER — Other Ambulatory Visit: Payer: Self-pay | Admitting: Obstetrics and Gynecology

## 2016-04-08 DIAGNOSIS — Z01419 Encounter for gynecological examination (general) (routine) without abnormal findings: Secondary | ICD-10-CM | POA: Diagnosis not present

## 2016-04-08 DIAGNOSIS — Z124 Encounter for screening for malignant neoplasm of cervix: Secondary | ICD-10-CM | POA: Diagnosis not present

## 2016-04-09 DIAGNOSIS — M25511 Pain in right shoulder: Secondary | ICD-10-CM | POA: Diagnosis not present

## 2016-04-09 DIAGNOSIS — M25512 Pain in left shoulder: Secondary | ICD-10-CM | POA: Diagnosis not present

## 2016-04-09 DIAGNOSIS — M25611 Stiffness of right shoulder, not elsewhere classified: Secondary | ICD-10-CM | POA: Diagnosis not present

## 2016-04-10 LAB — CYTOLOGY - PAP

## 2016-04-19 DIAGNOSIS — M6281 Muscle weakness (generalized): Secondary | ICD-10-CM | POA: Diagnosis not present

## 2016-04-19 DIAGNOSIS — R531 Weakness: Secondary | ICD-10-CM | POA: Diagnosis not present

## 2016-04-19 DIAGNOSIS — M25512 Pain in left shoulder: Secondary | ICD-10-CM | POA: Diagnosis not present

## 2016-04-19 DIAGNOSIS — M25511 Pain in right shoulder: Secondary | ICD-10-CM | POA: Diagnosis not present

## 2016-04-19 DIAGNOSIS — M25611 Stiffness of right shoulder, not elsewhere classified: Secondary | ICD-10-CM | POA: Diagnosis not present

## 2016-04-19 DIAGNOSIS — M25612 Stiffness of left shoulder, not elsewhere classified: Secondary | ICD-10-CM | POA: Diagnosis not present

## 2016-04-23 DIAGNOSIS — M25512 Pain in left shoulder: Secondary | ICD-10-CM | POA: Diagnosis not present

## 2016-04-23 DIAGNOSIS — M6281 Muscle weakness (generalized): Secondary | ICD-10-CM | POA: Diagnosis not present

## 2016-04-23 DIAGNOSIS — M25612 Stiffness of left shoulder, not elsewhere classified: Secondary | ICD-10-CM | POA: Diagnosis not present

## 2016-04-26 DIAGNOSIS — M25612 Stiffness of left shoulder, not elsewhere classified: Secondary | ICD-10-CM | POA: Diagnosis not present

## 2016-04-26 DIAGNOSIS — M25512 Pain in left shoulder: Secondary | ICD-10-CM | POA: Diagnosis not present

## 2016-04-26 DIAGNOSIS — M6281 Muscle weakness (generalized): Secondary | ICD-10-CM | POA: Diagnosis not present

## 2016-04-30 DIAGNOSIS — M6281 Muscle weakness (generalized): Secondary | ICD-10-CM | POA: Diagnosis not present

## 2016-04-30 DIAGNOSIS — M25512 Pain in left shoulder: Secondary | ICD-10-CM | POA: Diagnosis not present

## 2016-04-30 DIAGNOSIS — M25612 Stiffness of left shoulder, not elsewhere classified: Secondary | ICD-10-CM | POA: Diagnosis not present

## 2016-05-03 DIAGNOSIS — M25612 Stiffness of left shoulder, not elsewhere classified: Secondary | ICD-10-CM | POA: Diagnosis not present

## 2016-05-03 DIAGNOSIS — M6281 Muscle weakness (generalized): Secondary | ICD-10-CM | POA: Diagnosis not present

## 2016-05-03 DIAGNOSIS — M25512 Pain in left shoulder: Secondary | ICD-10-CM | POA: Diagnosis not present

## 2016-05-07 DIAGNOSIS — M6281 Muscle weakness (generalized): Secondary | ICD-10-CM | POA: Diagnosis not present

## 2016-05-07 DIAGNOSIS — M25612 Stiffness of left shoulder, not elsewhere classified: Secondary | ICD-10-CM | POA: Diagnosis not present

## 2016-05-07 DIAGNOSIS — M25512 Pain in left shoulder: Secondary | ICD-10-CM | POA: Diagnosis not present

## 2016-05-09 DIAGNOSIS — Z09 Encounter for follow-up examination after completed treatment for conditions other than malignant neoplasm: Secondary | ICD-10-CM | POA: Diagnosis not present

## 2016-05-09 DIAGNOSIS — R1110 Vomiting, unspecified: Secondary | ICD-10-CM | POA: Diagnosis not present

## 2016-05-10 ENCOUNTER — Other Ambulatory Visit (HOSPITAL_COMMUNITY): Payer: Self-pay | Admitting: General Surgery

## 2016-05-10 DIAGNOSIS — M6281 Muscle weakness (generalized): Secondary | ICD-10-CM | POA: Diagnosis not present

## 2016-05-10 DIAGNOSIS — R059 Cough, unspecified: Secondary | ICD-10-CM

## 2016-05-10 DIAGNOSIS — IMO0001 Reserved for inherently not codable concepts without codable children: Secondary | ICD-10-CM

## 2016-05-10 DIAGNOSIS — R1110 Vomiting, unspecified: Principal | ICD-10-CM

## 2016-05-10 DIAGNOSIS — M25612 Stiffness of left shoulder, not elsewhere classified: Secondary | ICD-10-CM | POA: Diagnosis not present

## 2016-05-10 DIAGNOSIS — R05 Cough: Secondary | ICD-10-CM

## 2016-05-10 DIAGNOSIS — M25512 Pain in left shoulder: Secondary | ICD-10-CM | POA: Diagnosis not present

## 2016-05-13 ENCOUNTER — Ambulatory Visit (HOSPITAL_COMMUNITY)
Admission: RE | Admit: 2016-05-13 | Discharge: 2016-05-13 | Disposition: A | Discharge status: Home / Self Care | Payer: Commercial Managed Care - HMO | Source: Ambulatory Visit | Attending: General Surgery | Admitting: General Surgery

## 2016-05-13 DIAGNOSIS — R05 Cough: Secondary | ICD-10-CM

## 2016-05-13 DIAGNOSIS — IMO0001 Reserved for inherently not codable concepts without codable children: Secondary | ICD-10-CM

## 2016-05-13 DIAGNOSIS — R1110 Vomiting, unspecified: Secondary | ICD-10-CM | POA: Insufficient documentation

## 2016-05-13 DIAGNOSIS — R059 Cough, unspecified: Secondary | ICD-10-CM

## 2016-05-14 DIAGNOSIS — M25612 Stiffness of left shoulder, not elsewhere classified: Secondary | ICD-10-CM | POA: Diagnosis not present

## 2016-05-14 DIAGNOSIS — M6281 Muscle weakness (generalized): Secondary | ICD-10-CM | POA: Diagnosis not present

## 2016-05-14 DIAGNOSIS — M25512 Pain in left shoulder: Secondary | ICD-10-CM | POA: Diagnosis not present

## 2016-05-17 DIAGNOSIS — M6281 Muscle weakness (generalized): Secondary | ICD-10-CM | POA: Diagnosis not present

## 2016-05-17 DIAGNOSIS — M25612 Stiffness of left shoulder, not elsewhere classified: Secondary | ICD-10-CM | POA: Diagnosis not present

## 2016-05-17 DIAGNOSIS — M25512 Pain in left shoulder: Secondary | ICD-10-CM | POA: Diagnosis not present

## 2016-05-21 DIAGNOSIS — M6281 Muscle weakness (generalized): Secondary | ICD-10-CM | POA: Diagnosis not present

## 2016-05-21 DIAGNOSIS — M25612 Stiffness of left shoulder, not elsewhere classified: Secondary | ICD-10-CM | POA: Diagnosis not present

## 2016-05-21 DIAGNOSIS — M25512 Pain in left shoulder: Secondary | ICD-10-CM | POA: Diagnosis not present

## 2016-05-22 DIAGNOSIS — Z4651 Encounter for fitting and adjustment of gastric lap band: Secondary | ICD-10-CM | POA: Diagnosis not present

## 2016-05-23 DIAGNOSIS — Z23 Encounter for immunization: Secondary | ICD-10-CM | POA: Diagnosis not present

## 2016-05-23 DIAGNOSIS — K219 Gastro-esophageal reflux disease without esophagitis: Secondary | ICD-10-CM | POA: Diagnosis not present

## 2016-05-28 DIAGNOSIS — M25612 Stiffness of left shoulder, not elsewhere classified: Secondary | ICD-10-CM | POA: Diagnosis not present

## 2016-05-28 DIAGNOSIS — M25512 Pain in left shoulder: Secondary | ICD-10-CM | POA: Diagnosis not present

## 2016-05-28 DIAGNOSIS — M6281 Muscle weakness (generalized): Secondary | ICD-10-CM | POA: Diagnosis not present

## 2016-05-31 DIAGNOSIS — M25612 Stiffness of left shoulder, not elsewhere classified: Secondary | ICD-10-CM | POA: Diagnosis not present

## 2016-05-31 DIAGNOSIS — M6281 Muscle weakness (generalized): Secondary | ICD-10-CM | POA: Diagnosis not present

## 2016-05-31 DIAGNOSIS — M25512 Pain in left shoulder: Secondary | ICD-10-CM | POA: Diagnosis not present

## 2016-06-04 DIAGNOSIS — M25612 Stiffness of left shoulder, not elsewhere classified: Secondary | ICD-10-CM | POA: Diagnosis not present

## 2016-06-04 DIAGNOSIS — M25512 Pain in left shoulder: Secondary | ICD-10-CM | POA: Diagnosis not present

## 2016-06-04 DIAGNOSIS — M6281 Muscle weakness (generalized): Secondary | ICD-10-CM | POA: Diagnosis not present

## 2016-06-07 DIAGNOSIS — M25612 Stiffness of left shoulder, not elsewhere classified: Secondary | ICD-10-CM | POA: Diagnosis not present

## 2016-06-07 DIAGNOSIS — M25512 Pain in left shoulder: Secondary | ICD-10-CM | POA: Diagnosis not present

## 2016-06-07 DIAGNOSIS — M6281 Muscle weakness (generalized): Secondary | ICD-10-CM | POA: Diagnosis not present

## 2016-06-08 DIAGNOSIS — Z1211 Encounter for screening for malignant neoplasm of colon: Secondary | ICD-10-CM | POA: Diagnosis not present

## 2016-06-12 DIAGNOSIS — Z1389 Encounter for screening for other disorder: Secondary | ICD-10-CM | POA: Diagnosis not present

## 2016-06-12 DIAGNOSIS — B349 Viral infection, unspecified: Secondary | ICD-10-CM | POA: Diagnosis not present

## 2016-06-12 DIAGNOSIS — R112 Nausea with vomiting, unspecified: Secondary | ICD-10-CM | POA: Diagnosis not present

## 2016-06-12 DIAGNOSIS — R6883 Chills (without fever): Secondary | ICD-10-CM | POA: Diagnosis not present

## 2016-06-18 DIAGNOSIS — M6281 Muscle weakness (generalized): Secondary | ICD-10-CM | POA: Diagnosis not present

## 2016-06-18 DIAGNOSIS — M25612 Stiffness of left shoulder, not elsewhere classified: Secondary | ICD-10-CM | POA: Diagnosis not present

## 2016-06-18 DIAGNOSIS — M25512 Pain in left shoulder: Secondary | ICD-10-CM | POA: Diagnosis not present

## 2016-06-21 DIAGNOSIS — M6281 Muscle weakness (generalized): Secondary | ICD-10-CM | POA: Diagnosis not present

## 2016-06-21 DIAGNOSIS — M25612 Stiffness of left shoulder, not elsewhere classified: Secondary | ICD-10-CM | POA: Diagnosis not present

## 2016-06-21 DIAGNOSIS — M25512 Pain in left shoulder: Secondary | ICD-10-CM | POA: Diagnosis not present

## 2016-06-25 DIAGNOSIS — M25562 Pain in left knee: Secondary | ICD-10-CM | POA: Diagnosis not present

## 2016-06-25 DIAGNOSIS — M25512 Pain in left shoulder: Secondary | ICD-10-CM | POA: Diagnosis not present

## 2016-06-25 DIAGNOSIS — M6281 Muscle weakness (generalized): Secondary | ICD-10-CM | POA: Diagnosis not present

## 2016-06-25 DIAGNOSIS — M25612 Stiffness of left shoulder, not elsewhere classified: Secondary | ICD-10-CM | POA: Diagnosis not present

## 2016-06-27 DIAGNOSIS — Z9884 Bariatric surgery status: Secondary | ICD-10-CM | POA: Diagnosis not present

## 2016-06-27 DIAGNOSIS — R1110 Vomiting, unspecified: Secondary | ICD-10-CM | POA: Diagnosis not present

## 2016-06-28 DIAGNOSIS — M6281 Muscle weakness (generalized): Secondary | ICD-10-CM | POA: Diagnosis not present

## 2016-06-28 DIAGNOSIS — M25512 Pain in left shoulder: Secondary | ICD-10-CM | POA: Diagnosis not present

## 2016-06-28 DIAGNOSIS — M25612 Stiffness of left shoulder, not elsewhere classified: Secondary | ICD-10-CM | POA: Diagnosis not present

## 2016-07-02 DIAGNOSIS — M6281 Muscle weakness (generalized): Secondary | ICD-10-CM | POA: Diagnosis not present

## 2016-07-02 DIAGNOSIS — M25612 Stiffness of left shoulder, not elsewhere classified: Secondary | ICD-10-CM | POA: Diagnosis not present

## 2016-07-02 DIAGNOSIS — M25512 Pain in left shoulder: Secondary | ICD-10-CM | POA: Diagnosis not present

## 2016-07-05 DIAGNOSIS — M25612 Stiffness of left shoulder, not elsewhere classified: Secondary | ICD-10-CM | POA: Diagnosis not present

## 2016-07-05 DIAGNOSIS — M6281 Muscle weakness (generalized): Secondary | ICD-10-CM | POA: Diagnosis not present

## 2016-07-05 DIAGNOSIS — M25512 Pain in left shoulder: Secondary | ICD-10-CM | POA: Diagnosis not present

## 2016-08-06 ENCOUNTER — Encounter (HOSPITAL_COMMUNITY): Payer: Self-pay

## 2016-08-06 ENCOUNTER — Emergency Department (HOSPITAL_COMMUNITY)
Admission: EM | Admit: 2016-08-06 | Discharge: 2016-08-06 | Disposition: A | Discharge status: Home / Self Care | Payer: 59 | Attending: Emergency Medicine | Admitting: Emergency Medicine

## 2016-08-06 ENCOUNTER — Emergency Department (HOSPITAL_COMMUNITY): Payer: 59

## 2016-08-06 DIAGNOSIS — R079 Chest pain, unspecified: Secondary | ICD-10-CM | POA: Diagnosis present

## 2016-08-06 DIAGNOSIS — Z79899 Other long term (current) drug therapy: Secondary | ICD-10-CM | POA: Insufficient documentation

## 2016-08-06 DIAGNOSIS — F17210 Nicotine dependence, cigarettes, uncomplicated: Secondary | ICD-10-CM | POA: Diagnosis not present

## 2016-08-06 DIAGNOSIS — E039 Hypothyroidism, unspecified: Secondary | ICD-10-CM | POA: Insufficient documentation

## 2016-08-06 DIAGNOSIS — R0789 Other chest pain: Secondary | ICD-10-CM | POA: Insufficient documentation

## 2016-08-06 LAB — BASIC METABOLIC PANEL
ANION GAP: 3 — AB (ref 5–15)
BUN: 16 mg/dL (ref 6–20)
CHLORIDE: 106 mmol/L (ref 101–111)
CO2: 29 mmol/L (ref 22–32)
Calcium: 9 mg/dL (ref 8.9–10.3)
Creatinine, Ser: 0.84 mg/dL (ref 0.44–1.00)
GFR calc non Af Amer: >60 mL/min (ref >60)
Glucose, Bld: 118 mg/dL — ABNORMAL HIGH (ref 65–99)
POTASSIUM: 4.4 mmol/L (ref 3.5–5.1)
SODIUM: 138 mmol/L (ref 135–145)

## 2016-08-06 LAB — CBC
HEMATOCRIT: 37.3 % (ref 36.0–46.0)
HEMOGLOBIN: 12 g/dL (ref 12.0–15.0)
MCH: 27.6 pg (ref 26.0–34.0)
MCHC: 32.2 g/dL (ref 30.0–36.0)
MCV: 85.7 fL (ref 78.0–100.0)
Platelets: 227 10*3/uL (ref 150–400)
RBC: 4.35 MIL/uL (ref 3.87–5.11)
RDW: 14.5 % (ref 11.5–15.5)
WBC: 7.2 10*3/uL (ref 4.0–10.5)

## 2016-08-06 LAB — I-STAT TROPONIN, ED
TROPONIN I, POC: 0.01 ng/mL (ref 0.00–0.08)
Troponin i, poc: 0 ng/mL (ref 0.00–0.08)

## 2016-08-06 LAB — D-DIMER, QUANTITATIVE (NOT AT ARMC)

## 2016-08-06 MED ORDER — GI COCKTAIL ~~LOC~~
30.0000 mL | Freq: Once | ORAL | Status: AC
  Administered 2016-08-06: 30 mL via ORAL
  Filled 2016-08-06: qty 30

## 2016-08-06 NOTE — Discharge Instructions (Signed)
Her workup has been registering in the ED. Imaging and lab work reveals normal exams. Symptoms likely consistent with acid reflux. Continue taking your Nexium I would also add a Pepcid or Zantac daily. Make sure that she follow-up with her GI doctor.  If you develop any worsening symptoms please return to the ED.

## 2016-08-06 NOTE — ED Provider Notes (Signed)
[facility] DEPT Provider Note   CSN: [id number] Arrival date & time: 08/06/16  0801     History   Chief Complaint Chief Complaint  Patient presents with  . Chest Pain    HPI Angela Abbott is a 56 y.o. female.  HPI 56 year old Caucasian female past medical history significant for GERD presents to the emergency department today with complaints of chest pain. The patient states that approximately 3 AM this morning she developed pain in her left shoulder that radiated under her left breast. States that the pain was sharp and lasted for a few seconds then resolved. States that it periodically returned which is why she presented to the ED for evaluation. The patient does report some diaphoresis and nausea with the episode but denies any emesis. States that she thought it was her GERD and took her medication which did not help. Patient describes the pain as sharp in nature. She denies any shortness of breath with it. Patient does report increasing smoking over the past 6 months after she had shoulder surgery. Patient also reports being on oral estrogen. She denies any recent hospitalizations/surgeries, prolonged immobilizations, lower extremity edema or calf tenderness. She denies any history of cardiac disease. Does report family history of cardiac disease. She denies any hypercholesterolemia or high blood pressure. All he takes Nexium for her GERD. Patient states that the pain has subsided at this time. She does report a nonproductive cough for the past 2-3 days. Denies any fevers.  Pt denies any fever, chill, ha, vision changes, lightheadedness, dizziness, congestion, neck pain,abd pain, v/d, urinary symptoms, change in bowel habits, melena, hematochezia, lower extremity paresthesias.  Past Medical History:  Diagnosis Date  . Arthritis    shoulders, knees, hips  . Dental crowns present   . GERD (gastroesophageal reflux disease)   . Hypothyroidism   . Left knee pain 07/2014  . Stress  incontinence   . Wears partial dentures    upper and lower    Patient Active Problem List   Diagnosis Date Noted  . Sciatica of left side 05/18/2013  . Arthritis of wrist, left, degenerative 03/30/2013  . History of laparoscopic adjustable gastric banding, 02/17/2008 04/29/2012  . Morbid obesity ([place]) 01/16/2012  . PLANTAR FACIITIS 07/01/2007  . KNEE PAIN 04/29/2007  . TEAR MEDIAL MENISCUS 04/29/2007    Past Surgical History:  Procedure Laterality Date  . ABDOMINAL HYSTERECTOMY     partial  . [place]  . ESOPHAGOGASTRODUODENOSCOPY N/A 11/26/2012   Procedure: ESOPHAGOGASTRODUODENOSCOPY (EGD);  Surgeon: Shann Medal, MD;  Location: [facility];  Service: General;  Laterality: N/A;  . GANGLION CYST EXCISION Right 02/2008   foot  . KNEE ARTHROSCOPY WITH LATERAL MENISECTOMY Left 07/21/2014   Procedure: LEFT KNEE ARTHROSCOPY,  CHONDROPLASTY, WITH LATERAL AND MEDIAL  MENISCECTOMIES;  Surgeon: Kathryne Hitch, MD;  Location: [facility];  Service: Orthopedics;  Laterality: Left;  . KNEE ARTHROSCOPY WITH MEDIAL MENISECTOMY Left 07/21/2014   Procedure: KNEE ARTHROSCOPY WITH MEDIAL MENISECTOMY;  Surgeon: Kathryne Hitch, MD;  Location: [facility];  Service: Orthopedics;  Laterality: Left;  . LAPAROSCOPIC GASTRIC BANDING  02/18/2008  . PARTIAL HYSTERECTOMY  09/07/2002  . SHOULDER ARTHROSCOPY WITH SUBACROMIAL DECOMPRESSION, ROTATOR CUFF REPAIR AND BICEP TENDON REPAIR Right 11/23/2015   Procedure: RIGHT SHOULDER ARTHROSCOPY WITH DEBRIDEMENT, SUBACROMIAL DECOMPRESSION, DISTAL CLAVICLE EXCISION, ROTATOR CUFF REPAIR AND BICEP TENODESIS;  Surgeon: Ninetta Lights, MD;  Location: [facility];  Service: Orthopedics;  Laterality: Right;  OB History    No data available       Home Medications    Prior to Admission medications   Medication Sig Start Date End Date Taking? Authorizing Provider  esomeprazole (NEXIUM) 40 MG capsule Take 40  mg by mouth daily.    Yes [provider]  estradiol (ESTRACE) 1 MG tablet Take 1 mg by mouth daily.    Yes [provider]  levothyroxine (SYNTHROID, LEVOTHROID) 125 MCG tablet Take 125 mcg by mouth daily before breakfast.   Yes [provider]  tolterodine (DETROL LA) 4 MG 24 hr capsule Take 4 mg by mouth daily.   Yes [provider]  ondansetron (ZOFRAN) 4 MG tablet Take 1 tablet (4 mg total) by mouth every 8 (eight) hours as needed for nausea or vomiting. Patient not taking: Reported on 08/06/2016 11/23/15   Aundra Dubin, PA-C  oxyCODONE-acetaminophen (ROXICET) 5-325 MG per tablet Take 1-2 tablets by mouth every 4 (four) hours as needed. Patient not taking: Reported on 08/06/2016 07/21/14   Nathaniel Man    Family History History reviewed. No pertinent family history.  Social History Social History  Substance Use Topics  . Smoking status: Current Every Day Smoker    Packs/day: 0.50    Years: 35.00    Types: Cigarettes  . Smokeless tobacco: Never Used  . Alcohol use Yes     Comment: occasionally     Allergies   Bee venom   Review of Systems Review of Systems  Constitutional: Positive for diaphoresis. Negative for chills and fever.  HENT: Negative for congestion.   Eyes: Negative for visual disturbance.  Respiratory: Negative for cough and shortness of breath.   Cardiovascular: Positive for chest pain. Negative for palpitations and leg swelling.  Gastrointestinal: Positive for nausea. Negative for abdominal pain, diarrhea and vomiting.  Genitourinary: Negative for dysuria, flank pain, frequency, hematuria and urgency.  Musculoskeletal: Negative for arthralgias and myalgias.  Skin: Negative for rash.  Neurological: Negative for dizziness, syncope, weakness, light-headedness, numbness and headaches.  Psychiatric/Behavioral: Negative for sleep disturbance. The patient is not nervous/anxious.      Physical Exam Updated Vital  Signs BP 118/64 (BP Location: Right Arm)   Pulse 60   Temp 98 F (36.7 C) (Oral)   Resp 13   Ht 5\' 6"  (1.676 m)   Wt 99.8 kg (220 lb)   SpO2 97%   BMI 35.51 kg/m   Physical Exam  Constitutional: She is oriented to person, place, and time. She appears well-developed and well-nourished.  Non-toxic appearance. No distress.  HENT:  Head: Normocephalic and atraumatic.  Nose: Nose normal.  Mouth/Throat: Oropharynx is clear and moist.  Eyes: Pupils are equal, round, and reactive to light. Conjunctivae and EOM are normal. Right eye exhibits no discharge. Left eye exhibits no discharge.  Neck: Normal range of motion. Neck supple. No JVD present. No tracheal deviation present.  Cardiovascular: Normal rate, regular rhythm, normal heart sounds and intact distal pulses.  Exam reveals no gallop and no friction rub.   No murmur heard. All extremities are warm. Pulses are 2+ in all extremities.  Pulmonary/Chest: Effort normal and breath sounds normal. No respiratory distress. She has no wheezes. She has no rales. She exhibits no tenderness.  No hypoxia or tachypnea.  Abdominal: Soft. Bowel sounds are normal. She exhibits no distension. There is no tenderness. There is no rebound and no guarding.  Musculoskeletal: Normal range of motion.  No lower extremity edema or calf tenderness.  Lymphadenopathy:    She has no cervical adenopathy.  Neurological: She is alert and oriented to person, place, and time.  Skin: Skin is warm and dry. Capillary refill takes less than 2 seconds. She is not diaphoretic.  Psychiatric: Her behavior is normal. Judgment and thought content normal.  Nursing note and vitals reviewed.    ED Treatments / Results  Labs (all labs ordered are listed, but only abnormal results are displayed) Labs Reviewed  BASIC METABOLIC PANEL - Abnormal; Notable for the following:       Result Value   Glucose, Bld 118 (*)    Anion gap 3 (*)    All other components within normal limits    CBC  D-DIMER, QUANTITATIVE (NOT AT [facility])  I-STAT TROPONIN, ED  I-STAT TROPONIN, ED    EKG  EKG Interpretation  Date/Time:  Tuesday August 06 2016 08:07:41 EDT Ventricular Rate:  67 PR Interval:    QRS Duration: 98 QT Interval:  394 QTC Calculation: 416 R Axis:   -21 Text Interpretation:  Sinus rhythm Borderline left axis deviation Abnormal R-wave progression, early transition NAC  Confirmed by Brantley Stage [phone]) on 08/06/2016 8:24:06 AM       Radiology Dg Chest 2 View  Result Date: 08/06/2016 CLINICAL DATA:  Chest pain EXAM: CHEST  2 VIEW COMPARISON:  12/18/2004 FINDINGS: Borderline heart size accentuated by low volumes. Negative aortic and hilar contours. There is no edema, consolidation, effusion, or pneumothorax. No acute osseous finding. EKG leads create artifact over the chest. IMPRESSION: No evidence of active disease. Electronically Signed   By: Monte Fantasia M.D.   On: 08/06/2016 08:47    Procedures Procedures (including critical care time)  Medications Ordered in ED Medications  gi cocktail (Maalox,Lidocaine,Donnatal) (30 mLs Oral Given 08/06/16 1057)     Initial Impression / Assessment and Plan / ED Course  I have reviewed the triage vital signs and the nursing notes.  Pertinent labs & imaging results that were available during my care of the patient were reviewed by me and considered in my medical decision making (see chart for details).     Pt presents to the Ed today with complaints of cp with history of acid reflux and lap band.. Patient is to be discharged with recommendation to follow up with PCP in regards to today's hospital visit. Chest pain is not likely of cardiac or pulmonary etiology d/t presentation, /d-dimer negative, VSS, no tracheal deviation, no JVD or new murmur, RRR, breath sounds equal bilaterally, EKG without any change from prior tracing and shows no signs of ischemia, negative delta troponin, and negative CXR. Patient feels much improved  after GI cocktail. Exam does not seem consistent with ACS, PE, pneumonia. Heart score is 2. Have counseled patient for 10 minutes on the importance of smoking cessation given that she is on an oral estrogen. Pt has been advised to return to the ED is CP becomes exertional, associated with diaphoresis or nausea, radiates to left jaw/arm, worsens or becomes concerning in any way. Pt appears reliable for follow up and is agreeable to discharge.      Final Clinical Impressions(s) / ED Diagnoses   Final diagnoses:  Atypical chest pain    New Prescriptions New Prescriptions   No medications on file     Aaron Edelman 08/06/16 1212    Forde Dandy, MD 08/06/16 1640

## 2016-08-06 NOTE — ED Triage Notes (Signed)
Pt. Coming from work via [place] for chest pain that started around 3am. Pt. Took GERD medication and it didn't help. Pt. States it was sharp pains that started in left shoulder and radiation to left chest. Pt. States she also got diaphoretic and nauseated. Pt. On her way to ED when she felt better and went home and went to bed. Pt. Had another episode this morning and her work Therapist, sports instructed her to come to ED. Pt. Is a smoker, but denies high cholesterol or high blood pressure. Pt. Mother MI at 30 years old. Pt. Denies pain at this time.

## 2016-08-08 ENCOUNTER — Other Ambulatory Visit (HOSPITAL_COMMUNITY): Payer: Self-pay | Admitting: General Surgery

## 2016-08-08 DIAGNOSIS — IMO0001 Reserved for inherently not codable concepts without codable children: Secondary | ICD-10-CM

## 2016-08-08 DIAGNOSIS — R1110 Vomiting, unspecified: Principal | ICD-10-CM

## 2016-08-17 DIAGNOSIS — B029 Zoster without complications: Secondary | ICD-10-CM | POA: Diagnosis not present

## 2016-08-19 DIAGNOSIS — B029 Zoster without complications: Secondary | ICD-10-CM | POA: Diagnosis not present

## 2016-08-23 ENCOUNTER — Other Ambulatory Visit (HOSPITAL_COMMUNITY): Payer: Self-pay | Admitting: General Surgery

## 2016-08-23 ENCOUNTER — Ambulatory Visit (HOSPITAL_COMMUNITY)
Admission: RE | Admit: 2016-08-23 | Discharge: 2016-08-23 | Disposition: A | Discharge status: Home / Self Care | Payer: 59 | Source: Ambulatory Visit | Attending: General Surgery | Admitting: General Surgery

## 2016-08-23 DIAGNOSIS — K3189 Other diseases of stomach and duodenum: Secondary | ICD-10-CM | POA: Diagnosis not present

## 2016-08-23 DIAGNOSIS — Z9884 Bariatric surgery status: Secondary | ICD-10-CM | POA: Insufficient documentation

## 2016-08-23 DIAGNOSIS — IMO0001 Reserved for inherently not codable concepts without codable children: Secondary | ICD-10-CM

## 2016-08-23 DIAGNOSIS — R1110 Vomiting, unspecified: Secondary | ICD-10-CM

## 2016-10-10 DIAGNOSIS — R05 Cough: Secondary | ICD-10-CM | POA: Diagnosis not present

## 2016-10-10 DIAGNOSIS — Z9884 Bariatric surgery status: Secondary | ICD-10-CM | POA: Diagnosis not present

## 2016-10-15 DIAGNOSIS — H6123 Impacted cerumen, bilateral: Secondary | ICD-10-CM | POA: Diagnosis not present

## 2016-10-24 DIAGNOSIS — M79672 Pain in left foot: Secondary | ICD-10-CM | POA: Diagnosis not present

## 2016-10-24 DIAGNOSIS — M7742 Metatarsalgia, left foot: Secondary | ICD-10-CM | POA: Diagnosis not present

## 2016-11-12 ENCOUNTER — Encounter: Payer: Self-pay | Admitting: Internal Medicine

## 2016-11-12 ENCOUNTER — Ambulatory Visit (INDEPENDENT_AMBULATORY_CARE_PROVIDER_SITE_OTHER): Payer: 59 | Admitting: Internal Medicine

## 2016-11-12 VITALS — BP 126/74 | HR 80 | Ht 66.25 in | Wt 208.0 lb

## 2016-11-12 DIAGNOSIS — F17210 Nicotine dependence, cigarettes, uncomplicated: Secondary | ICD-10-CM | POA: Diagnosis not present

## 2016-11-12 DIAGNOSIS — R05 Cough: Secondary | ICD-10-CM | POA: Diagnosis not present

## 2016-11-12 DIAGNOSIS — R053 Chronic cough: Secondary | ICD-10-CM | POA: Insufficient documentation

## 2016-11-12 MED ORDER — OMEPRAZOLE-SODIUM BICARBONATE 40-1100 MG PO CAPS: 1.0000 | ORAL_CAPSULE | Freq: Every day | ORAL | 2 refills | Status: DC

## 2016-11-12 NOTE — Patient Instructions (Addendum)
Change zergrid  To 40 at bedtime   GERD (REFLUX)  is an extremely common cause of respiratory symptoms just like yours , many times with no obvious heartburn at all.    It can be treated with medication, but also with lifestyle changes including elevation of the head of your bed (ideally with 6 inch  bed blocks),  Smoking cessation, avoidance of late meals, excessive alcohol, and avoid fatty foods, chocolate, peppermint, colas, red wine, and acidic juices such as orange juice.  NO MINT OR MENTHOL PRODUCTS SO NO COUGH DROPS   USE SUGARLESS CANDY INSTEAD (Jolley ranchers or Stover's or Life Savers) or even ice chips will also do - the key is to swallow to prevent all throat clearing. NO OIL BASED VITAMINS - use powdered substitutes.   Please schedule a follow up office visit in 4 weeks with pfts on return, sooner if needed - I can call in the carafate for you if this is not helping

## 2016-11-12 NOTE — Progress Notes (Signed)
Subjective:     Patient ID: Angela Abbott, female   DOB: December 15, 1960,     MRN: [id number]  HPI  21 yowf active smoker s/p lap band 2010 for MO  with no prior resp problems until April 2018 developed noct cough / wheeze around 5 h p hs so referred to pulmonary clinic 11/12/2016 by Dr   [name].   11/12/2016 1st [facility] office visit/    Chief Complaint  Patient presents with  . Pulmonary Consult    Referred by Dr. [name] for pulm clearance for removal of lap band. Pt c/o cough only at night since June 2018. She states cough is prod with clear sputum. The cough wakes her up every night.  She states she notices some wheezing also. She states that she coughs occ until she loses urinary continence and has vomiting.   onset of cough was actually April 2018 only noct and assoc with vomiting UGI 05/13/16 was abnormal at [facility] junction  06/27/16 felt better on carafate and zegerd and and stayed on zegerid /carafate finished  08/23/16 repeat UGI was nl on just the zegerid 6 pm daily  Cough recurred p completed the carafate but while still on zegrid otc doses 6pm nightly and happens most nocts around the same time = 3-5 h p hs assoc with subj wheeze but min actual sputum, just vomit once or twice a week, assoc with urinary incont also/ never tried inhalers / not really limited by sob from daytime activities but no aerobics   No obvious day to day or daytime variability or assoc excess/ purulent sputum or mucus plugs or hemoptysis or cp or chest tightness,   overt sinus or hb symptoms. No unusual exp hx or h/o childhood pna/ asthma or knowledge of premature birth.   Also denies any obvious fluctuation of symptoms with weather or environmental changes or other aggravating or alleviating factors except as outlined above   Current Allergies, Complete Past Medical History, Past Surgical History, Family History, and Social History were reviewed in [employer] record.  ROS  The  following are not active complaints unless bolded Hoarseness, sore throat, dysphagia, dental problems, itching, sneezing,  nasal congestion or discharge of excess mucus or purulent secretions, ear ache,   fever, chills, sweats, unintended wt loss or wt gain, classically pleuritic or exertional cp,  orthopnea pnd or leg swelling, presyncope, palpitations, abdominal pain, anorexia, nausea, vomiting, diarrhea  or change in bowel habits or change in bladder habits, change in stools or change in urine, dysuria, hematuria,  rash, arthralgias, visual complaints, headache, numbness, weakness or ataxia or problems with walking or coordination,  change in mood/affect or memory.        Current Meds  Medication Sig  . esomeprazole (NEXIUM) 40 MG capsule Take 40 mg by mouth daily.   Marland Kitchen estradiol (ESTRACE) 1 MG tablet Take 1 mg by mouth daily.   Marland Kitchen levothyroxine (SYNTHROID, LEVOTHROID) 125 MCG tablet Take 125 mcg by mouth daily before breakfast.  . Omeprazole-Sodium Bicarbonate (ZEGERID) 20-1100 MG CAPS capsule Take 1 capsule every evening by mouth.  . tolterodine (DETROL LA) 4 MG 24 hr capsule Take 4 mg by mouth daily.         Review of Systems     Objective:   Physical Exam    amb wf nad   Wt Readings from Last 3 Encounters:  11/12/16 208 lb (94.3 kg)  08/06/16 220 lb (99.8 kg)  11/23/15 214 lb (97.1 kg)  Vital signs reviewed  - Note on arrival 02 sats  100% on RA     HEENT: nl dentition, turbinates bilaterally, and oropharynx. Nl external ear canals without cough reflex   NECK :  without JVD/Nodes/TM/ nl carotid upstrokes bilaterally   LUNGS: no acc muscle use,  Nl contour chest which is clear to A and P bilaterally without cough on insp or exp maneuvers   CV:  RRR  no s3 or murmur or increase in P2, and no edema   ABD:  soft and nontender with nl inspiratory excursion in the supine position. No bruits or organomegaly appreciated, bowel sounds nl  MS:  Nl gait/ ext warm without  deformities, calf tenderness, cyanosis or clubbing No obvious joint restrictions   SKIN: warm and dry without lesions    NEURO:  alert, approp, nl sensorium with  no motor or cerebellar deficits apparent.      I personally reviewed images and agree with radiology impression as follows:  CXR:    08/06/16 No evidence of active disease.  Assessment:

## 2016-11-13 ENCOUNTER — Encounter: Payer: Self-pay | Admitting: Internal Medicine

## 2016-11-13 DIAGNOSIS — F17210 Nicotine dependence, cigarettes, uncomplicated: Secondary | ICD-10-CM | POA: Insufficient documentation

## 2016-11-13 NOTE — Assessment & Plan Note (Signed)
>   3 m  Will need baseline pfts and informed of risk that the smoking is contributing to some of her symptoms now but also potentially a multitude of other pulmonary and extrapulmonary dz but not willing to commit at this point

## 2016-11-13 NOTE — Assessment & Plan Note (Signed)
The most common causes of chronic cough in immunocompetent adults include the following: upper airway cough syndrome (UACS), previously referred to as postnasal drip syndrome (PNDS), which is caused by variety of rhinosinus conditions; (2) asthma; (3) GERD; (4) chronic bronchitis from cigarette smoking or other inhaled environmental irritants; (5) nonasthmatic eosinophilic bronchitis; and (6) bronchiectasis.   These conditions, singly or in combination, have accounted for up to 94% of the causes of chronic cough in prospective studies.   Other conditions have constituted no >6% of the causes in prospective studies These have included bronchogenic carcinoma, chronic interstitial pneumonia, sarcoidosis, left ventricular failure, ACEI-induced cough, and aspiration from a condition associated with pharyngeal dysfunction.    Chronic cough is often simultaneously caused by more than one condition. A single cause has been found from 38 to 82% of the time, multiple causes from 18 to 62%. Multiply caused cough has been the result of three diseases up to 42% of the time.       Despite smoking hx the cough is not typical at all of CB or AB but most likely a form of Upper airway cough syndrome (previously labeled PNDS),  is so named because it's frequently impossible to sort out how much is  CR/sinusitis with freq throat clearing (which can be related to primary GERD)   vs  causing  secondary (" extra esophageal")  GERD from wide swings in gastric pressure that occur with throat clearing, often  promoting self use of mint and menthol lozenges that reduce the lower esophageal sphincter tone and exacerbate the problem further in a cyclical fashion.   These are the same pts (now being labeled as having "irritable larynx syndrome" by some cough centers) who not infrequently have a history of having failed to tolerate ace inhibitors,  dry powder inhalers or biphosphonates or report having atypical/extraesophageal reflux  symptoms that don't respond to standard doses of PPI  and are easily confused as having aecopd or asthma flares by even experienced allergists/ pulmonologists (myself included).   Of the three most common causes of  Sub-acute or recurrent or chronic cough, only one (GERD)  can actually contribute to/ trigger  the other two (asthma and post nasal drip syndrome)  and perpetuate the cylce of cough.  While not intuitively obvious, many patients with chronic low grade reflux do not cough until there is a primary insult that disturbs the protective epithelial barrier and exposes sensitive nerve endings.   This is typically viral but can be direct physical injury such as with an endotracheal tube.   The point is that once this occurs, it is difficult to eliminate the cycle  using anything but a maximally effective acid suppression regimen at least in the short run, accompanied by an appropriate diet to address non acid GERD and in her case adding back the carafate if this does not work.   She will return in 4 weeks for pfts to complete the w/u    Total time devoted to counseling  > 50 % of initial 60 min office visit:  review case with pt/ discussion of options/alternatives/ personally creating written customized instructions  in presence of pt  then going over those specific  Instructions directly with the pt including how to use all of the meds but in particular covering each new medication in detail and the difference between the maintenance= "automatic" meds and the prns using an action plan format for the latter (If this problem/symptom => do that organization reading Left to  right).  Please see AVS from this visit for a full list of these instructions which I personally wrote for this pt and  are unique to this visit.

## 2016-11-21 DIAGNOSIS — M7742 Metatarsalgia, left foot: Secondary | ICD-10-CM | POA: Diagnosis not present

## 2016-11-21 DIAGNOSIS — M7741 Metatarsalgia, right foot: Secondary | ICD-10-CM | POA: Diagnosis not present

## 2016-11-21 DIAGNOSIS — M79671 Pain in right foot: Secondary | ICD-10-CM | POA: Diagnosis not present

## 2016-12-13 ENCOUNTER — Ambulatory Visit: Payer: Self-pay | Admitting: Internal Medicine

## 2016-12-20 DIAGNOSIS — R319 Hematuria, unspecified: Secondary | ICD-10-CM | POA: Diagnosis not present

## 2016-12-20 DIAGNOSIS — M545 Low back pain: Secondary | ICD-10-CM | POA: Diagnosis not present

## 2016-12-24 ENCOUNTER — Encounter: Payer: Self-pay | Admitting: Internal Medicine

## 2016-12-24 ENCOUNTER — Ambulatory Visit (INDEPENDENT_AMBULATORY_CARE_PROVIDER_SITE_OTHER): Payer: 59 | Admitting: Internal Medicine

## 2016-12-24 VITALS — BP 146/90 | HR 83 | Ht 66.0 in | Wt 206.0 lb

## 2016-12-24 DIAGNOSIS — R05 Cough: Secondary | ICD-10-CM

## 2016-12-24 DIAGNOSIS — R053 Chronic cough: Secondary | ICD-10-CM

## 2016-12-24 DIAGNOSIS — F17210 Nicotine dependence, cigarettes, uncomplicated: Secondary | ICD-10-CM | POA: Diagnosis not present

## 2016-12-24 LAB — PULMONARY FUNCTION TEST
DL/VA % PRED: 94 %
DL/VA: 4.75 ml/min/mmHg/L
DLCO COR % PRED: 79 %
DLCO UNC: 21.77 ml/min/mmHg
DLCO cor: 21.33 ml/min/mmHg
DLCO unc % pred: 80 %
FEF 25-75 POST: 2.5 L/s
FEF 25-75 Pre: 3.9 L/sec
FEF2575-%Change-Post: -35 %
FEF2575-%PRED-POST: 97 %
FEF2575-%Pred-Pre: 152 %
FEV1-%CHANGE-POST: -11 %
FEV1-%PRED-PRE: 100 %
FEV1-%Pred-Post: 89 %
FEV1-PRE: 2.82 L
FEV1-Post: 2.51 L
FEV1FVC-%CHANGE-POST: -2 %
FEV1FVC-%Pred-Pre: 112 %
FEV6-%Change-Post: -9 %
FEV6-%PRED-PRE: 91 %
FEV6-%Pred-Post: 82 %
FEV6-POST: 2.88 L
FEV6-PRE: 3.18 L
FEV6FVC-%PRED-POST: 103 %
FEV6FVC-%Pred-Pre: 103 %
FVC-%Change-Post: -9 %
FVC-%PRED-PRE: 88 %
FVC-%Pred-Post: 79 %
FVC-POST: 2.88 L
FVC-PRE: 3.18 L
POST FEV6/FVC RATIO: 100 %
PRE FEV6/FVC RATIO: 100 %
Post FEV1/FVC ratio: 87 %
Pre FEV1/FVC ratio: 89 %
RV % PRED: 85 %
RV: 1.75 L
TLC % PRED: 89 %
TLC: 4.78 L

## 2016-12-24 MED ORDER — SUCRALFATE 1 GM/10ML PO SUSP: 1.0000 g | Freq: Every evening | ORAL | 11 refills | Status: DC | PRN

## 2016-12-24 NOTE — Progress Notes (Signed)
Subjective:     Patient ID: Angela Abbott, female   DOB: 1960/08/07,     MRN: [id number]    Brief patient profile:  3 yowf active smoker s/p lap band 2010 for MO  with no prior resp problems until April 2018 developed noct cough / wheeze around 5 h p hs so referred to pulmonary clinic 11/12/2016 by Dr   [name].    History of Present Illness  11/12/2016 1st [facility] office visit/    Chief Complaint  Patient presents with  . Pulmonary Consult    Referred by Dr. [name] for pulm clearance for removal of lap band. Pt c/o cough only at night since June 2018. She states cough is prod with clear sputum. The cough wakes her up every night.  She states she notices some wheezing also. She states that she coughs occ until she loses urinary continence and has vomiting.   onset of cough was actually April 2018 only noct and assoc with vomiting UGI 05/13/16 was abnormal at [facility] junction  06/27/16 felt better on carafate and zegerd and and stayed on zegerid /carafate finished  08/23/16 repeat UGI was nl on just the zegerid 6 pm daily  Cough recurred p completed the carafate but while still on zegrid otc doses 6pm nightly and happens most nocts around the same time = 3-5 h p hs assoc with subj wheeze but min actual sputum, just vomit once or twice a week, assoc with urinary incont also/ never tried inhalers / not really limited by sob from daytime activities but no aerobics  rec Change zergrid  To 40 at bedtime  GERD diet Please schedule a follow up office visit in 4 weeks with pfts on return, sooner if needed - I can call in the carafate for you if this is not helping      12/24/2016  f/u ov/ re:  Nl pfts / no need for saba  Chief Complaint  Patient presents with  . Follow-up    PFT's done today.  Cough has improved some. No new co's.    wakes up every noct around 3-4 am coughing and sits up and reads (no RX) and better p 1-2 hours then back to bed x April 2018  Tried carafate x one  dose at hs and eliminated this problem so wants a rx Not limited by breathing from desired activities    No obvious day to day or daytime variability or assoc excess/ purulent sputum or mucus plugs or hemoptysis or cp or chest tightness, subjective wheeze or overt sinus or hb symptoms. No unusual exposure hx or h/o childhood pna/ asthma or knowledge of premature birth.    Also denies any obvious fluctuation of symptoms with weather or environmental changes or other aggravating or alleviating factors except as outlined above   Current Allergies, Complete Past Medical History, Past Surgical History, Family History, and Social History were reviewed in [employer] record.  ROS  The following are not active complaints unless bolded Hoarseness, sore throat, dysphagia, dental problems, itching, sneezing,  nasal congestion or discharge of excess mucus or purulent secretions, ear ache,   fever, chills, sweats, unintended wt loss or wt gain, classically pleuritic or exertional cp,  orthopnea pnd or leg swelling, presyncope, palpitations, abdominal pain, anorexia, nausea, vomiting, diarrhea  or change in bowel habits or change in bladder habits, change in stools or change in urine, dysuria, hematuria,  rash, arthralgias, visual complaints, headache, numbness, weakness or ataxia or  problems with walking or coordination,  change in mood/affect or memory.        Current Meds  Medication Sig  . ciprofloxacin (CIPRO) 500 MG tablet Take 1 tablet by mouth 2 (two) times daily.  Marland Kitchen esomeprazole (NEXIUM) 40 MG capsule Take 40 mg by mouth daily.   Marland Kitchen estradiol (ESTRACE) 1 MG tablet Take 1 mg by mouth daily.   Marland Kitchen levothyroxine (SYNTHROID, LEVOTHROID) 125 MCG tablet Take 125 mcg by mouth daily before breakfast.  . omeprazole-sodium bicarbonate (ZEGERID) 40-1100 MG capsule Take 1 capsule by mouth at bedtime.  . sucralfate (CARAFATE) 1 GM/10ML suspension Take 10 mLs (1 g total) by mouth at bedtime as  needed.  . tolterodine (DETROL LA) 4 MG 24 hr capsule Take 4 mg by mouth daily.  .   sucralfate (CARAFATE) 1 GM/10ML suspension Take 1 g by mouth at bedtime as needed.                Objective:   Physical Exam  amb wf with vigorous throat clearing    12/24/2016      206   11/12/16 208 lb (94.3 kg)  08/06/16 220 lb (99.8 kg)  11/23/15 214 lb (97.1 kg)    Vital signs reviewed - Note on arrival 02 sats  96% on RA    HEENT: nl dentition, turbinates bilaterally, and oropharynx. Nl external ear canals without cough reflex   NECK :  without JVD/Nodes/TM/ nl carotid upstrokes bilaterally   LUNGS: no acc muscle use,  Nl contour chest which is clear to A and P bilaterally without cough on insp or exp maneuvers   CV:  RRR  no s3 or murmur or increase in P2, and no edema   ABD:  soft and nontender with nl inspiratory excursion in the supine position. No bruits or organomegaly appreciated, bowel sounds nl  MS:  Nl gait/ ext warm without deformities, calf tenderness, cyanosis or clubbing No obvious joint restrictions   SKIN: warm and dry without lesions    NEURO:  alert, approp, nl sensorium with  no motor or cerebellar deficits apparent.                 Assessment:

## 2016-12-24 NOTE — Patient Instructions (Signed)
Try carafate 1 gm slurry at bedtime to see if the response you notice is consistent and if so this is a GI issue, not a pulmonary problem   GERD (REFLUX)  is an extremely common cause of respiratory symptoms just like yours , many times with no obvious heartburn at all.    It can be treated with medication, but also with lifestyle changes including elevation of the head of your bed (ideally with 6 inch  bed blocks),  Smoking cessation, avoidance of late meals, excessive alcohol, and avoid fatty foods, chocolate, peppermint, colas, red wine, and acidic juices such as orange juice.  NO MINT OR MENTHOL PRODUCTS SO NO COUGH DROPS   USE SUGARLESS CANDY INSTEAD (Jolley ranchers or Stover's or Life Savers) or even ice chips will also do - the key is to swallow to prevent all throat clearing. NO OIL BASED VITAMINS - use powdered substitutes.   The key is to stop smoking completely before smoking completely stops you - it is not too late!     If you are satisfied with your treatment plan,  let your doctor know and he/she can either refill your medications or you can return here when your prescription runs out.     If in any way you are not 100% satisfied,  please tell us.  If 100% better, tell your friends!  Pulmonary follow up is as needed

## 2016-12-24 NOTE — Progress Notes (Signed)
PFT completed today 12/24/16  

## 2016-12-25 ENCOUNTER — Encounter: Payer: Self-pay | Admitting: Internal Medicine

## 2016-12-25 NOTE — Assessment & Plan Note (Signed)
>   3 min discussion I reviewed the Fletcher curve with the patient that basically indicates  if you quit smoking when your best day FEV1 is still well preserved (as is clearly  the case here)  it is highly unlikely you will progress to severe disease and informed the patient there was  no medication on the market that has proven to alter the curve/ its downward trajectory  or the likelihood of progression of their disease(unlike other chronic medical conditions such as atheroclerosis where we do think we can change the natural hx with risk reducing meds)    Therefore stopping smoking and maintaining abstinence are  the most important aspects of care, not choice of inhalers or for that matter, doctors.   Treatment other than smoking cessation  is entirely directed by severity of symptoms and focused also on reducing exacerbations, not attempting to change the natural history of the disease.    Pulmonary f/u is prn

## 2016-12-25 NOTE — Assessment & Plan Note (Signed)
PFT's  12/24/2016  FEV1 2.82  (100 % ) ratio 89  p no % improvement from saba p nothing prior to study with DLCO  80 % corrects to 94  % for alv volume  2.82  - trial of carafate hs 12/24/2016 >>>   I had an extended final summary discussion with the patient reviewing all relevant studies completed to date and  lasting 15 to 20 minutes of a 25 minute visit on the following issues:    Despite smoking she has no evidence of any airflow obst at all and I'm most inclined to believe the cough is uacs related to atypical LPR ? Related to lap band and not to pulmonary source based on her hs and above findings  Refilled her carafate therefore and discussed smoking cessation with pulmonary f/u prn

## 2017-01-09 DIAGNOSIS — R319 Hematuria, unspecified: Secondary | ICD-10-CM | POA: Diagnosis not present

## 2017-01-09 DIAGNOSIS — R109 Unspecified abdominal pain: Secondary | ICD-10-CM | POA: Diagnosis not present

## 2017-01-12 DIAGNOSIS — L02411 Cutaneous abscess of right axilla: Secondary | ICD-10-CM | POA: Diagnosis not present

## 2017-01-12 DIAGNOSIS — R030 Elevated blood-pressure reading, without diagnosis of hypertension: Secondary | ICD-10-CM | POA: Diagnosis not present

## 2017-01-30 ENCOUNTER — Other Ambulatory Visit: Payer: Self-pay | Admitting: Internal Medicine

## 2017-02-02 ENCOUNTER — Emergency Department (HOSPITAL_COMMUNITY): Payer: 59

## 2017-02-02 ENCOUNTER — Emergency Department (HOSPITAL_COMMUNITY)
Admission: EM | Admit: 2017-02-02 | Discharge: 2017-02-02 | Disposition: A | Discharge status: Home / Self Care | Payer: 59 | Attending: Emergency Medicine | Admitting: Emergency Medicine

## 2017-02-02 ENCOUNTER — Encounter (HOSPITAL_COMMUNITY): Payer: Self-pay | Admitting: *Deleted

## 2017-02-02 DIAGNOSIS — R05 Cough: Secondary | ICD-10-CM | POA: Diagnosis present

## 2017-02-02 DIAGNOSIS — R0789 Other chest pain: Secondary | ICD-10-CM | POA: Diagnosis not present

## 2017-02-02 DIAGNOSIS — E039 Hypothyroidism, unspecified: Secondary | ICD-10-CM | POA: Insufficient documentation

## 2017-02-02 DIAGNOSIS — R059 Cough, unspecified: Secondary | ICD-10-CM

## 2017-02-02 DIAGNOSIS — F17210 Nicotine dependence, cigarettes, uncomplicated: Secondary | ICD-10-CM | POA: Diagnosis not present

## 2017-02-02 DIAGNOSIS — I444 Left anterior fascicular block: Secondary | ICD-10-CM | POA: Diagnosis not present

## 2017-02-02 MED ORDER — DOXYCYCLINE HYCLATE 100 MG PO TABS
100.0000 mg | ORAL_TABLET | Freq: Once | ORAL | Status: AC
  Administered 2017-02-02: 100 mg via ORAL
  Filled 2017-02-02: qty 1

## 2017-02-02 MED ORDER — KETOROLAC TROMETHAMINE 60 MG/2ML IM SOLN
30.0000 mg | Freq: Once | INTRAMUSCULAR | Status: AC
  Administered 2017-02-02: 30 mg via INTRAMUSCULAR
  Filled 2017-02-02: qty 2

## 2017-02-02 MED ORDER — DOXYCYCLINE HYCLATE 100 MG PO CAPS: 100.0000 mg | ORAL_CAPSULE | Freq: Two times a day (BID) | ORAL | 0 refills | Status: DC

## 2017-02-02 MED ORDER — MELOXICAM 7.5 MG PO TABS: 7.5000 mg | ORAL_TABLET | Freq: Every day | ORAL | 0 refills | Status: DC

## 2017-02-02 MED ORDER — CYCLOBENZAPRINE HCL 10 MG PO TABS
5.0000 mg | ORAL_TABLET | Freq: Once | ORAL | Status: AC
  Administered 2017-02-02: 5 mg via ORAL
  Filled 2017-02-02: qty 1

## 2017-02-02 NOTE — ED Triage Notes (Signed)
Pt reports having fever and chills on Friday and pt has had a bad cough ever since. Pt states when she coughs, she has sharp pain in her right rib cage when she coughs.

## 2017-02-02 NOTE — ED Provider Notes (Signed)
Emergency Department Provider Note   I have reviewed the triage vital signs and the nursing notes.   HISTORY  Chief Complaint Cough   HPI Angela Abbott is a 57 y.o. female who has a years worth of chronic nighttime cough but is not here.  She is here she has had some right-sided chest pain that just started yesterday is worse every time states deep breath or if she does cough or laughs.  It is in her right lateral area.  No rashes.  She did have some fevers and chills about 4 5 days ago but that has since resolved.  She has a history of having shingles but that was a burning pain and was in the left side and this does not feel anything like that.  She has no history of blood clots or heart attacks.  She has no other significant past medical history.  No shortness of breath aside from not want to take a large breath because of her pain. No other associated or modifying symptoms.    Past Medical History:  Diagnosis Date  . Arthritis    shoulders, knees, hips  . Dental crowns present   . GERD (gastroesophageal reflux disease)   . Hypothyroidism   . Left knee pain 07/2014  . Stress incontinence   . Wears partial dentures    upper and lower    Patient Active Problem List   Diagnosis Date Noted  . Cigarette smoker 11/13/2016  . Chronic cough 11/12/2016  . Sciatica of left side 05/18/2013  . Arthritis of wrist, left, degenerative 03/30/2013  . History of laparoscopic adjustable gastric banding, 02/17/2008 04/29/2012  . Morbid obesity ([place]) 01/16/2012  . PLANTAR FACIITIS 07/01/2007  . KNEE PAIN 04/29/2007  . TEAR MEDIAL MENISCUS 04/29/2007    Past Surgical History:  Procedure Laterality Date  . ABDOMINAL HYSTERECTOMY     partial  . [place]  . ESOPHAGOGASTRODUODENOSCOPY N/A 11/26/2012   Procedure: ESOPHAGOGASTRODUODENOSCOPY (EGD);  Surgeon: Shann Medal, MD;  Location: [facility];  Service: General;  Laterality: N/A;  . GANGLION CYST EXCISION  Right 02/2008   foot  . KNEE ARTHROSCOPY WITH LATERAL MENISECTOMY Left 07/21/2014   Procedure: LEFT KNEE ARTHROSCOPY,  CHONDROPLASTY, WITH LATERAL AND MEDIAL  MENISCECTOMIES;  Surgeon: Kathryne Hitch, MD;  Location: [place];  Service: Orthopedics;  Laterality: Left;  . KNEE ARTHROSCOPY WITH MEDIAL MENISECTOMY Left 07/21/2014   Procedure: KNEE ARTHROSCOPY WITH MEDIAL MENISECTOMY;  Surgeon: Kathryne Hitch, MD;  Location: [place];  Service: Orthopedics;  Laterality: Left;  . LAPAROSCOPIC GASTRIC BANDING  02/18/2008  . PARTIAL HYSTERECTOMY  09/07/2002  . SHOULDER ARTHROSCOPY WITH SUBACROMIAL DECOMPRESSION, ROTATOR CUFF REPAIR AND BICEP TENDON REPAIR Right 11/23/2015   Procedure: RIGHT SHOULDER ARTHROSCOPY WITH DEBRIDEMENT, SUBACROMIAL DECOMPRESSION, DISTAL CLAVICLE EXCISION, ROTATOR CUFF REPAIR AND BICEP TENODESIS;  Surgeon: Ninetta Lights, MD;  Location: [place];  Service: Orthopedics;  Laterality: Right;    Current Outpatient Rx  . Order #: [id number] Class: Historical Med  . Order #: [id number] Class: Historical Med  . Order #: [id number] Class: Historical Med  . Order #: [id number] Class: Normal  . Order #: [id number] Class: Historical Med  . Order #: [id number] Class: Print  . Order #: [id number] Class: Print    Allergies Bee venom  No family history on file.  Social History Social History   Tobacco Use  . Smoking status: Current Every Day Smoker    Packs/day: 0.50    Years: 35.00  Pack years: 17.50    Types: Cigarettes  . Smokeless tobacco: Never Used  Substance Use Topics  . Alcohol use: Yes    Comment: occasionally  . Drug use: No    Review of Systems  All other systems negative except as documented in the HPI. All pertinent positives and negatives as reviewed in the HPI. ____________________________________________   PHYSICAL EXAM:  VITAL SIGNS: ED Triage Vitals  Enc Vitals Group     BP 02/02/17 2107 (!) 158/98     Pulse  Rate 02/02/17 2107 95     Resp 02/02/17 2107 15     Temp 02/02/17 2107 99.8 F (37.7 C)     Temp Source 02/02/17 2107 Oral     SpO2 02/02/17 2107 96 %     Weight 02/02/17 2107 206 lb (93.4 kg)     Height 02/02/17 2107 5\' 6"  (1.676 m)     Head Circumference --      Peak Flow --      Pain Score 02/02/17 2108 10     Pain Loc --      Pain Edu? --      Excl. in [place]? --     Constitutional: Alert and oriented. Well appearing and in no acute distress. Eyes: Conjunctivae are normal. PERRL. EOMI. Head: Atraumatic. Nose: No congestion/rhinnorhea. Mouth/Throat: Mucous membranes are moist.  Oropharynx non-erythematous. Neck: No stridor.  No meningeal signs.   Cardiovascular: Normal rate, regular rhythm. Good peripheral circulation. Grossly normal heart sounds.   Respiratory: Normal respiratory effort.  No retractions. Lungs CTAB. Gastrointestinal: Soft and nontender. No distention.  Musculoskeletal: No lower extremity tenderness nor edema. No gross deformities of extremities. Neurologic:  Normal speech and language. No gross focal neurologic deficits are appreciated.  Skin:  Skin is warm, dry and intact. No rash noted.   ____________________________________________   LABS (all labs ordered are listed, but only abnormal results are displayed)  Labs Reviewed - No data to display ____________________________________________  EKG   EKG Interpretation  Date/Time:  Sunday February 02 2017 22:31:49 EST Ventricular Rate:  78 PR Interval:    QRS Duration: 98 QT Interval:  372 QTC Calculation: 424 R Axis:   -50 Text Interpretation:  Sinus rhythm Left anterior fascicular block Low voltage, precordial leads No significant change since last tracing Confirmed by Merrily Pew [phone]) on 02/02/2017 10:37:57 PM       ____________________________________________  RADIOLOGY  Dg Chest 2 View  Result Date: 02/02/2017 CLINICAL DATA:  nonproductive cough, fever, and chills for 3 days. EXAM:  CHEST  2 VIEW COMPARISON:  08/06/2016 FINDINGS: New bibasilar pulmonary opacity is seen, right side greater than left. No evidence of pleural effusion. Heart size and mediastinal contours are normal. IMPRESSION: New bibasilar pulmonary opacity, consistent with pneumonia. Recommend followup PA and lateral chest X-ray in several weeks to ensure resolution and exclude underlying malignancy. Electronically Signed   By: Earle Gell M.D.   On: 02/02/2017 22:37    ____________________________________________   PROCEDURES  Procedure(s) performed:   Procedures   ____________________________________________   INITIAL IMPRESSION / ASSESSMENT AND PLAN / ED COURSE  xr to ensure no fracture or PTX. However, seems pleuritic in n ature. No rash to suggest zoster. Low risk for PE. Will treat with antiflammatories and pcp follow up.   CXR with e/o bibasilar opacities. With her chronic cough, will rx abx course and pcp follow up for repeat cxr to ensure improvement.    Pertinent labs & imaging results that were available  during my care of the patient were reviewed by me and considered in my medical decision making (see chart for details).  ____________________________________________  FINAL CLINICAL IMPRESSION(S) / ED DIAGNOSES  Final diagnoses:  Cough     MEDICATIONS GIVEN DURING THIS VISIT:  Medications  ketorolac (TORADOL) injection 30 mg (30 mg Intramuscular Given 02/02/17 2206)  cyclobenzaprine (FLEXERIL) tablet 5 mg (5 mg Oral Given 02/02/17 2206)  doxycycline (VIBRA-TABS) tablet 100 mg (100 mg Oral Given 02/02/17 2304)     NEW OUTPATIENT MEDICATIONS STARTED DURING THIS VISIT:  Discharge Medication List as of 02/02/2017 10:50 PM    START taking these medications   Details  doxycycline (VIBRAMYCIN) 100 MG capsule Take 1 capsule (100 mg total) by mouth 2 (two) times daily. One po bid x 7 days, Starting Sun 02/02/2017, Print    meloxicam (MOBIC) 7.5 MG tablet Take 1 tablet (7.5 mg  total) by mouth daily., Starting Sun 02/02/2017, Print        Note:  This note was prepared with assistance of Dragon voice recognition software. Occasional wrong-word or sound-a-like substitutions may have occurred due to the inherent limitations of voice recognition software.   Merrily Pew, MD 02/02/17 [phone]

## 2017-02-12 ENCOUNTER — Other Ambulatory Visit (HOSPITAL_COMMUNITY): Payer: Self-pay | Admitting: Family Medicine

## 2017-02-12 ENCOUNTER — Ambulatory Visit (HOSPITAL_COMMUNITY)
Admission: RE | Admit: 2017-02-12 | Discharge: 2017-02-12 | Disposition: A | Discharge status: Home / Self Care | Payer: 59 | Source: Ambulatory Visit | Attending: Family Medicine | Admitting: Family Medicine

## 2017-02-12 DIAGNOSIS — R05 Cough: Secondary | ICD-10-CM | POA: Diagnosis not present

## 2017-02-12 DIAGNOSIS — R0602 Shortness of breath: Secondary | ICD-10-CM

## 2017-02-12 DIAGNOSIS — R0981 Nasal congestion: Secondary | ICD-10-CM | POA: Diagnosis not present

## 2017-02-12 DIAGNOSIS — B349 Viral infection, unspecified: Secondary | ICD-10-CM | POA: Diagnosis not present

## 2017-02-12 DIAGNOSIS — R918 Other nonspecific abnormal finding of lung field: Secondary | ICD-10-CM | POA: Diagnosis not present

## 2017-02-12 DIAGNOSIS — E039 Hypothyroidism, unspecified: Secondary | ICD-10-CM | POA: Diagnosis not present

## 2017-02-12 DIAGNOSIS — R631 Polydipsia: Secondary | ICD-10-CM | POA: Diagnosis not present

## 2017-02-13 DIAGNOSIS — R05 Cough: Secondary | ICD-10-CM | POA: Diagnosis not present

## 2017-02-14 ENCOUNTER — Emergency Department (HOSPITAL_COMMUNITY): Payer: 59

## 2017-02-14 ENCOUNTER — Other Ambulatory Visit: Payer: Self-pay

## 2017-02-14 ENCOUNTER — Observation Stay (HOSPITAL_COMMUNITY)
Admission: EM | Admit: 2017-02-14 | Discharge: 2017-02-15 | Disposition: A | Discharge status: Home / Self Care | Payer: 59 | Attending: Internal Medicine | Admitting: Internal Medicine

## 2017-02-14 ENCOUNTER — Encounter (HOSPITAL_COMMUNITY): Payer: Self-pay

## 2017-02-14 DIAGNOSIS — E039 Hypothyroidism, unspecified: Secondary | ICD-10-CM | POA: Diagnosis not present

## 2017-02-14 DIAGNOSIS — R053 Chronic cough: Secondary | ICD-10-CM | POA: Diagnosis present

## 2017-02-14 DIAGNOSIS — R0789 Other chest pain: Secondary | ICD-10-CM | POA: Diagnosis not present

## 2017-02-14 DIAGNOSIS — Z79899 Other long term (current) drug therapy: Secondary | ICD-10-CM | POA: Insufficient documentation

## 2017-02-14 DIAGNOSIS — Z9884 Bariatric surgery status: Secondary | ICD-10-CM

## 2017-02-14 DIAGNOSIS — R0602 Shortness of breath: Secondary | ICD-10-CM | POA: Insufficient documentation

## 2017-02-14 DIAGNOSIS — R110 Nausea: Secondary | ICD-10-CM | POA: Insufficient documentation

## 2017-02-14 DIAGNOSIS — R05 Cough: Secondary | ICD-10-CM | POA: Diagnosis not present

## 2017-02-14 DIAGNOSIS — R61 Generalized hyperhidrosis: Secondary | ICD-10-CM | POA: Diagnosis not present

## 2017-02-14 DIAGNOSIS — F17210 Nicotine dependence, cigarettes, uncomplicated: Secondary | ICD-10-CM | POA: Insufficient documentation

## 2017-02-14 DIAGNOSIS — R200 Anesthesia of skin: Secondary | ICD-10-CM | POA: Insufficient documentation

## 2017-02-14 DIAGNOSIS — K219 Gastro-esophageal reflux disease without esophagitis: Secondary | ICD-10-CM | POA: Diagnosis present

## 2017-02-14 DIAGNOSIS — J189 Pneumonia, unspecified organism: Secondary | ICD-10-CM | POA: Diagnosis not present

## 2017-02-14 DIAGNOSIS — R079 Chest pain, unspecified: Secondary | ICD-10-CM | POA: Diagnosis not present

## 2017-02-14 DIAGNOSIS — R918 Other nonspecific abnormal finding of lung field: Secondary | ICD-10-CM | POA: Diagnosis not present

## 2017-02-14 DIAGNOSIS — K229 Disease of esophagus, unspecified: Secondary | ICD-10-CM | POA: Diagnosis not present

## 2017-02-14 LAB — CBC WITH DIFFERENTIAL/PLATELET
Basophils Absolute: 0 10*3/uL (ref 0.0–0.1)
Basophils Relative: 0 %
EOS PCT: 1 %
Eosinophils Absolute: 0.2 10*3/uL (ref 0.0–0.7)
HEMATOCRIT: 40.8 % (ref 36.0–46.0)
Hemoglobin: 13 g/dL (ref 12.0–15.0)
LYMPHS ABS: 2.2 10*3/uL (ref 0.7–4.0)
LYMPHS PCT: 16 %
MCH: 27 pg (ref 26.0–34.0)
MCHC: 31.9 g/dL (ref 30.0–36.0)
MCV: 84.8 fL (ref 78.0–100.0)
MONO ABS: 0.9 10*3/uL (ref 0.1–1.0)
Monocytes Relative: 7 %
NEUTROS ABS: 10.6 10*3/uL — AB (ref 1.7–7.7)
Neutrophils Relative %: 76 %
PLATELETS: 267 10*3/uL (ref 150–400)
RBC: 4.81 MIL/uL (ref 3.87–5.11)
RDW: 14.9 % (ref 11.5–15.5)
WBC: 13.8 10*3/uL — ABNORMAL HIGH (ref 4.0–10.5)

## 2017-02-14 LAB — COMPREHENSIVE METABOLIC PANEL
ALT: 14 U/L (ref 14–54)
AST: 18 U/L (ref 15–41)
Albumin: 3.2 g/dL — ABNORMAL LOW (ref 3.5–5.0)
Alkaline Phosphatase: 74 U/L (ref 38–126)
Anion gap: 12 (ref 5–15)
BILIRUBIN TOTAL: 0.3 mg/dL (ref 0.3–1.2)
BUN: 14 mg/dL (ref 6–20)
CHLORIDE: 103 mmol/L (ref 101–111)
CO2: 23 mmol/L (ref 22–32)
CREATININE: 0.71 mg/dL (ref 0.44–1.00)
Calcium: 8.6 mg/dL — ABNORMAL LOW (ref 8.9–10.3)
GFR calc non Af Amer: >60 mL/min (ref >60)
Glucose, Bld: 141 mg/dL — ABNORMAL HIGH (ref 65–99)
Potassium: 3.1 mmol/L — ABNORMAL LOW (ref 3.5–5.1)
Sodium: 138 mmol/L (ref 135–145)
Total Protein: 7.5 g/dL (ref 6.5–8.1)

## 2017-02-14 LAB — I-STAT CHEM 8, ED
BUN: 14 mg/dL (ref 6–20)
CREATININE: 0.7 mg/dL (ref 0.44–1.00)
Calcium, Ion: 1.21 mmol/L (ref 1.15–1.40)
Chloride: 103 mmol/L (ref 101–111)
GLUCOSE: 137 mg/dL — AB (ref 65–99)
HEMATOCRIT: 41 % (ref 36.0–46.0)
HEMOGLOBIN: 13.9 g/dL (ref 12.0–15.0)
Potassium: 3.2 mmol/L — ABNORMAL LOW (ref 3.5–5.1)
Sodium: 141 mmol/L (ref 135–145)
TCO2: 24 mmol/L (ref 22–32)

## 2017-02-14 LAB — TROPONIN I: Troponin I: <0.03 ng/mL (ref <0.03)

## 2017-02-14 LAB — D-DIMER, QUANTITATIVE: D-Dimer, Quant: 0.43 ug/mL-FEU (ref 0.00–0.50)

## 2017-02-14 LAB — LIPASE, BLOOD: LIPASE: 24 U/L (ref 11–51)

## 2017-02-14 LAB — I-STAT CG4 LACTIC ACID, ED: Lactic Acid, Venous: 0.55 mmol/L (ref 0.5–1.9)

## 2017-02-14 LAB — I-STAT TROPONIN, ED: Troponin i, poc: 0.01 ng/mL (ref 0.00–0.08)

## 2017-02-14 MED ORDER — IPRATROPIUM-ALBUTEROL 0.5-2.5 (3) MG/3ML IN SOLN
3.0000 mL | Freq: Four times a day (QID) | RESPIRATORY_TRACT | Status: DC
  Administered 2017-02-14 – 2017-02-15 (×3): 3 mL via RESPIRATORY_TRACT
  Filled 2017-02-14 (×3): qty 3

## 2017-02-14 MED ORDER — POTASSIUM CHLORIDE CRYS ER 20 MEQ PO TBCR
40.0000 meq | EXTENDED_RELEASE_TABLET | Freq: Once | ORAL | Status: AC
  Administered 2017-02-14: 40 meq via ORAL
  Filled 2017-02-14: qty 2

## 2017-02-14 MED ORDER — ESTRADIOL 1 MG PO TABS
1.0000 mg | ORAL_TABLET | Freq: Every day | ORAL | Status: DC
Start: 2017-02-14 — End: 2017-02-15
  Administered 2017-02-14 – 2017-02-15 (×2): 1 mg via ORAL
  Filled 2017-02-14 (×5): qty 1

## 2017-02-14 MED ORDER — FESOTERODINE FUMARATE ER 8 MG PO TB24
8.0000 mg | ORAL_TABLET | Freq: Every day | ORAL | Status: DC
  Filled 2017-02-14 (×2): qty 1

## 2017-02-14 MED ORDER — NITROGLYCERIN 0.4 MG SL SUBL
0.4000 mg | SUBLINGUAL_TABLET | Freq: Once | SUBLINGUAL | Status: AC
  Administered 2017-02-14: 0.4 mg via SUBLINGUAL
  Filled 2017-02-14: qty 1

## 2017-02-14 MED ORDER — FESOTERODINE FUMARATE ER 4 MG PO TB24
8.0000 mg | ORAL_TABLET | Freq: Every day | ORAL | Status: DC
  Filled 2017-02-14 (×4): qty 1

## 2017-02-14 MED ORDER — ASPIRIN 81 MG PO CHEW
324.0000 mg | CHEWABLE_TABLET | Freq: Once | ORAL | Status: AC
  Administered 2017-02-14: 324 mg via ORAL
  Filled 2017-02-14: qty 4

## 2017-02-14 MED ORDER — IOPAMIDOL (ISOVUE-370) INJECTION 76%
100.0000 mL | Freq: Once | INTRAVENOUS | Status: AC | PRN
  Administered 2017-02-14: 100 mL via INTRAVENOUS

## 2017-02-14 MED ORDER — DEXTROSE 5 % IV SOLN
1.0000 g | Freq: Once | INTRAVENOUS | Status: AC
  Administered 2017-02-14: 1 g via INTRAVENOUS
  Filled 2017-02-14: qty 10

## 2017-02-14 MED ORDER — SODIUM CHLORIDE 0.9 % IV SOLN
INTRAVENOUS | Status: DC
  Administered 2017-02-14 – 2017-02-15 (×2): via INTRAVENOUS

## 2017-02-14 MED ORDER — ONDANSETRON HCL 4 MG/2ML IJ SOLN: 4.0000 mg | Freq: Four times a day (QID) | INTRAMUSCULAR | Status: DC | PRN

## 2017-02-14 MED ORDER — GUAIFENESIN ER 600 MG PO TB12
1200.0000 mg | ORAL_TABLET | Freq: Two times a day (BID) | ORAL | Status: DC
  Administered 2017-02-14 – 2017-02-15 (×3): 1200 mg via ORAL
  Filled 2017-02-14 (×10): qty 2

## 2017-02-14 MED ORDER — ACETAMINOPHEN 325 MG PO TABS: 650.0000 mg | ORAL_TABLET | Freq: Four times a day (QID) | ORAL | Status: DC | PRN

## 2017-02-14 MED ORDER — MELOXICAM 7.5 MG PO TABS
7.5000 mg | ORAL_TABLET | Freq: Every day | ORAL | Status: DC
Start: 2017-02-14 — End: 2017-02-15
  Administered 2017-02-14 – 2017-02-15 (×2): 7.5 mg via ORAL
  Filled 2017-02-14 (×5): qty 1

## 2017-02-14 MED ORDER — DEXTROSE 5 % IV SOLN
1.0000 g | INTRAVENOUS | Status: DC
  Administered 2017-02-15: 1 g via INTRAVENOUS
  Filled 2017-02-14 (×3): qty 10

## 2017-02-14 MED ORDER — LEVOTHYROXINE SODIUM 100 MCG PO TABS
125.0000 ug | ORAL_TABLET | Freq: Every day | ORAL | Status: DC
  Administered 2017-02-14 – 2017-02-15 (×2): 125 ug via ORAL
  Filled 2017-02-14: qty 3
  Filled 2017-02-14: qty 1

## 2017-02-14 MED ORDER — IPRATROPIUM-ALBUTEROL 0.5-2.5 (3) MG/3ML IN SOLN
RESPIRATORY_TRACT | Status: AC
  Administered 2017-02-14: 3 mL via RESPIRATORY_TRACT
  Filled 2017-02-14: qty 3

## 2017-02-14 MED ORDER — ENOXAPARIN SODIUM 40 MG/0.4ML ~~LOC~~ SOLN
40.0000 mg | Freq: Every day | SUBCUTANEOUS | Status: DC
  Administered 2017-02-14 – 2017-02-15 (×2): 40 mg via SUBCUTANEOUS
  Filled 2017-02-14 (×2): qty 0.4

## 2017-02-14 MED ORDER — DEXTROSE 5 % IV SOLN
500.0000 mg | Freq: Once | INTRAVENOUS | Status: AC
  Administered 2017-02-14: 500 mg via INTRAVENOUS
  Filled 2017-02-14: qty 500

## 2017-02-14 MED ORDER — DEXTROSE 5 % IV SOLN
500.0000 mg | INTRAVENOUS | Status: DC
  Administered 2017-02-15: 500 mg via INTRAVENOUS
  Filled 2017-02-14 (×3): qty 500

## 2017-02-14 MED ORDER — PANTOPRAZOLE SODIUM 40 MG PO TBEC
40.0000 mg | DELAYED_RELEASE_TABLET | Freq: Every day | ORAL | Status: DC
  Administered 2017-02-14 – 2017-02-15 (×2): 40 mg via ORAL
  Filled 2017-02-14 (×2): qty 1

## 2017-02-14 NOTE — ED Triage Notes (Signed)
Pt states she was awoke from sleep with sharp pain in her chest and back and states she was diaphoretic>  Pt reports numbness to her left arm from the shoulder down to her elbow.  Pt has good grip and strength.

## 2017-02-14 NOTE — ED Provider Notes (Signed)
[facility] EMERGENCY DEPARTMENT Provider Note   CSN: [id number] Arrival date & time: 02/14/17  8144     History   Chief Complaint Chief Complaint  Patient presents with  . Chest Pain    HPI Angela Abbott is a 57 y.o. female.  Level 5 caveat for acuity of condition.  Patient awoke from sleep with severe chest pain that radiates to her back and left arm.  She reports this is been ongoing for the past 1 hour.  Associated with shortness of breath and diaphoresis.  Reports numbness and pain going down her left arm to shoulder.  Patient is never had this kind of pain before.  Nothing makes it better or worse.  She denies any cardiac history.  She was seen by her PCP yesterday and diagnosed with pneumonia.  She was given a shot of Rocephin i in her PCPs office.  She does have a history of chronic cough.  She does have a history of acid reflux and GERD but states this feels different.   The history is provided by the patient. The history is limited by the condition of the patient.  Chest Pain   Associated symptoms include cough, diaphoresis, nausea and shortness of breath. Pertinent negatives include no abdominal pain, no dizziness, no headaches, no vomiting and no weakness.    Past Medical History:  Diagnosis Date  . Arthritis    shoulders, knees, hips  . Dental crowns present   . GERD (gastroesophageal reflux disease)   . Hypothyroidism   . Left knee pain 07/2014  . Stress incontinence   . Wears partial dentures    upper and lower    Patient Active Problem List   Diagnosis Date Noted  . Cigarette smoker 11/13/2016  . Chronic cough 11/12/2016  . Sciatica of left side 05/18/2013  . Arthritis of wrist, left, degenerative 03/30/2013  . History of laparoscopic adjustable gastric banding, 02/17/2008 04/29/2012  . Morbid obesity ([place]) 01/16/2012  . PLANTAR FACIITIS 07/01/2007  . KNEE PAIN 04/29/2007  . TEAR MEDIAL MENISCUS 04/29/2007    Past Surgical History:  Procedure  Laterality Date  . ABDOMINAL HYSTERECTOMY     partial  . [place]  . ESOPHAGOGASTRODUODENOSCOPY N/A 11/26/2012   Procedure: ESOPHAGOGASTRODUODENOSCOPY (EGD);  Surgeon: Shann Medal, MD;  Location: [facility];  Service: General;  Laterality: N/A;  . GANGLION CYST EXCISION Right 02/2008   foot  . KNEE ARTHROSCOPY WITH LATERAL MENISECTOMY Left 07/21/2014   Procedure: LEFT KNEE ARTHROSCOPY,  CHONDROPLASTY, WITH LATERAL AND MEDIAL  MENISCECTOMIES;  Surgeon: Kathryne Hitch, MD;  Location: [place];  Service: Orthopedics;  Laterality: Left;  . KNEE ARTHROSCOPY WITH MEDIAL MENISECTOMY Left 07/21/2014   Procedure: KNEE ARTHROSCOPY WITH MEDIAL MENISECTOMY;  Surgeon: Kathryne Hitch, MD;  Location: [place];  Service: Orthopedics;  Laterality: Left;  . LAPAROSCOPIC GASTRIC BANDING  02/18/2008  . PARTIAL HYSTERECTOMY  09/07/2002  . SHOULDER ARTHROSCOPY WITH SUBACROMIAL DECOMPRESSION, ROTATOR CUFF REPAIR AND BICEP TENDON REPAIR Right 11/23/2015   Procedure: RIGHT SHOULDER ARTHROSCOPY WITH DEBRIDEMENT, SUBACROMIAL DECOMPRESSION, DISTAL CLAVICLE EXCISION, ROTATOR CUFF REPAIR AND BICEP TENODESIS;  Surgeon: Ninetta Lights, MD;  Location: [place];  Service: Orthopedics;  Laterality: Right;    OB History    No data available       Home Medications    Prior to Admission medications   Medication Sig Start Date End Date Taking? Authorizing Provider  doxycycline (VIBRAMYCIN) 100 MG capsule Take  1 capsule (100 mg total) by mouth 2 (two) times daily. One po bid x 7 days 02/02/17   Mesner, Corene Cornea, MD  estradiol (ESTRACE) 1 MG tablet Take 1 mg by mouth daily.     [provider]  levothyroxine (SYNTHROID, LEVOTHROID) 125 MCG tablet Take 125 mcg by mouth daily before breakfast.    [provider]  meloxicam (MOBIC) 7.5 MG tablet Take 1 tablet (7.5 mg total) by mouth daily. 02/02/17   Mesner, Corene Cornea, MD  omeprazole (PRILOSEC) 40 MG  capsule Take 40 mg by mouth daily.    [provider]  sucralfate (CARAFATE) 1 GM/10ML suspension Take 10 mLs (1 g total) by mouth at bedtime as needed. 12/24/16   Tanda Rockers, MD  tolterodine (DETROL LA) 4 MG 24 hr capsule Take 4 mg by mouth daily.    [provider]    Family History No family history on file.  Social History Social History   Tobacco Use  . Smoking status: Current Every Day Smoker    Packs/day: 0.50    Years: 35.00    Pack years: 17.50    Types: Cigarettes  . Smokeless tobacco: Never Used  Substance Use Topics  . Alcohol use: Yes    Comment: occasionally  . Drug use: No     Allergies   Bee venom   Review of Systems Review of Systems  Constitutional: Positive for diaphoresis. Negative for activity change and appetite change.  Eyes: Negative for visual disturbance.  Respiratory: Positive for cough, chest tightness and shortness of breath.   Cardiovascular: Positive for chest pain.  Gastrointestinal: Positive for nausea. Negative for abdominal pain and vomiting.  Genitourinary: Negative for dysuria, hematuria, vaginal bleeding and vaginal discharge.  Musculoskeletal: Negative for arthralgias and myalgias.  Skin: Negative for rash.  Neurological: Negative for dizziness, weakness and headaches.   all other systems are negative except as noted in the HPI and PMH.     Physical Exam Updated Vital Signs BP (!) 151/72   Pulse 70   Resp (!) 30   Ht 5\' 6"  (1.676 m)   Wt 93.4 kg (206 lb)   SpO2 96%   BMI 33.25 kg/m   Physical Exam  Constitutional: She is oriented to person, place, and time. She appears well-developed and well-nourished. She appears distressed.  uncomfortable  HENT:  Head: Normocephalic and atraumatic.  Mouth/Throat: Oropharynx is clear and moist. No oropharyngeal exudate.  Eyes: Conjunctivae and EOM are normal. Pupils are equal, round, and reactive to light.  Neck: Normal range of motion. Neck supple.  No  meningismus.  Cardiovascular: Normal rate, regular rhythm, normal heart sounds and intact distal pulses.  No murmur heard. Equal radial pulses and grip strengths  Pulmonary/Chest: Effort normal and breath sounds normal. No respiratory distress. She exhibits tenderness.  Abdominal: Soft. There is no tenderness. There is no rebound and no guarding.  Musculoskeletal: Normal range of motion. She exhibits no edema or tenderness.  Neurological: She is alert and oriented to person, place, and time. No cranial nerve deficit. She exhibits normal muscle tone. Coordination normal.  No ataxia on finger to nose bilaterally. No pronator drift. 5/5 strength throughout. CN 2-12 intact.Equal grip strength. Sensation intact.   Skin: Skin is warm.  Psychiatric: She has a normal mood and affect. Her behavior is normal.  Nursing note and vitals reviewed.    ED Treatments / Results  Labs (all labs ordered are listed, but only abnormal results are displayed) Labs Reviewed  CBC  WITH DIFFERENTIAL/PLATELET - Abnormal; Notable for the following components:      Result Value   WBC 13.8 (*)    Neutro Abs 10.6 (*)    All other components within normal limits  COMPREHENSIVE METABOLIC PANEL - Abnormal; Notable for the following components:   Potassium 3.1 (*)    Glucose, Bld 141 (*)    Calcium 8.6 (*)    Albumin 3.2 (*)    All other components within normal limits  I-STAT CHEM 8, ED - Abnormal; Notable for the following components:   Potassium 3.2 (*)    Glucose, Bld 137 (*)    All other components within normal limits  CULTURE, BLOOD (ROUTINE X 2)  CULTURE, BLOOD (ROUTINE X 2)  LIPASE, BLOOD  TROPONIN I  D-DIMER, QUANTITATIVE (NOT AT [facility])  I-STAT TROPONIN, ED  I-STAT CG4 LACTIC ACID, ED  I-STAT CG4 LACTIC ACID, ED    EKG  EKG Interpretation None       Radiology Dg Chest 2 View  Result Date: 02/12/2017 CLINICAL DATA:  Shortness of Breath EXAM: CHEST  2 VIEW COMPARISON:  02/02/2017 FINDINGS:  Cardiac shadow is stable. Patchy changes are noted in the bases again but increased when compared with the prior exam particularly in the left lingula. No sizable effusion is seen. No bony abnormality is noted. IMPRESSION: Increase in left lingular infiltrate when compared with the prior exam. Electronically Signed   By: Inez Catalina M.D.   On: 02/12/2017 16:33    Procedures Procedures (including critical care time)  Medications Ordered in ED Medications  aspirin chewable tablet 324 mg (not administered)  nitroGLYCERIN (NITROSTAT) SL tablet 0.4 mg (0.4 mg Sublingual Given 02/14/17 0317)     Initial Impression / Assessment and Plan / ED Course  I have reviewed the triage vital signs and the nursing notes.  Pertinent labs & imaging results that were available during my care of the patient were reviewed by me and considered in my medical decision making (see chart for details).    Severe left-sided chest pain rating to the back that woke her from sleep associated with shortness of breath and nausea.  EKG does not show acute STEMI.  Recently diagnosed with pneumonia on the left side.  Will obtain labs, x-ray, give aspirin and nitroglycerin.  Patient with concerning presentation with severe left-sided chest pain that woke her from sleep with shortness of breath and nausea.  No STEMI on EKG.  She is given aspirin and nitroglycerin.  Chest x-ray shows minimal pneumonia in the left side.  She remains tachypneic.  D-dimer is negative.  CT angiogram obtained and ruled out aortic dissection.  No pulmonary embolism.  Does well multifocal pneumonia.  Chest pain has resolved after 2 nitroglycerin.  Blood pressure and tachycardia have improved though tachypnea persists. Unclear whether she is not chest pain related to her pneumonia there was also concerning for ACS.  Observation admission discussed with Dr. Darrick Meigs.  Patient given antibiotics for her pneumonia.  CRITICAL CARE Performed by: Ezequiel Essex Total critical care time: 40  Critical care time was exclusive of separately billable procedures and treating other patients. Critical care was necessary to treat or prevent imminent or life-threatening deterioration. Critical care was time spent personally by me on the following activities: development of treatment plan with patient and/or surrogate as well as nursing, discussions with consultants, evaluation of patient's response to treatment, examination of patient, obtaining history from patient or surrogate, ordering and performing treatments and interventions, ordering and  review of laboratory studies, ordering and review of radiographic studies, pulse oximetry and re-evaluation of patient's condition.  Final Clinical Impressions(s) / ED Diagnoses   Final diagnoses:  Chest pain, unspecified type  Multifocal pneumonia    ED Discharge Orders    None       , Annie Main, MD 02/14/17 [phone]

## 2017-02-14 NOTE — H&P (Signed)
History and Physical    KAREENA ARRAMBIDE LPF:[id number] DOB: 10/31/1960 DOA: 02/14/2017  PCP: Sharilyn Sites, MD  Patient coming from: Home  I have personally briefly reviewed patient's old medical records in [place]  Chief Complaint: Chest pain  HPI: Angela Abbott is a 57 y.o. female with medical history significant of hypothyroidism, GERD, history of lap band procedure in the past, chronic cough, presents to the hospital with chest discomfort.  She reports waking up at approximately 2:00 this morning with pain in her back, on the left side radiating around under her breast.  This was worse with deep inspiration and cough.  She had associated shortness of breath and diaphoresis.  She felt that the aching in her back/chest was radiating into her arm.  She has had a productive cough for the last several days which is different than her chronic cough.  She reports feeling feverish approximately 2 days ago and had a temperature of 101.  She has been more short of breath lately.  She has had a worsening cough over the last few weeks.  She has seen her primary care physician regarding this and tested negative for influenza.  She is completed a course of doxycycline without significant improvement.  Most recently, she received doxycycline as well as an injection of ceftriaxone and her primary care physician's office.  She did have 2 episodes of vomiting 2 days ago but has not any further since then.  No diarrhea.  No dysuria.  ED Course: Chest x-ray showed left lower lobe pneumonia.  Chest was negative for pulmonary embolus or aortic dissection, but did indicate multifocal pneumonia.  She noted to have a mild leukocytosis.  Cardiac enzymes were negative.  EKG did not show any acute findings.  She is been referred for admission.  Review of Systems: As per HPI otherwise 10 point review of systems negative.    Past Medical History:  Diagnosis Date  . Arthritis    shoulders, knees, hips  .  Dental crowns present   . GERD (gastroesophageal reflux disease)   . Hypothyroidism   . Left knee pain 07/2014  . Stress incontinence   . Wears partial dentures    upper and lower    Past Surgical History:  Procedure Laterality Date  . ABDOMINAL HYSTERECTOMY     partial  . [place]  . ESOPHAGOGASTRODUODENOSCOPY N/A 11/26/2012   Procedure: ESOPHAGOGASTRODUODENOSCOPY (EGD);  Surgeon: Shann Medal, MD;  Location: [facility];  Service: General;  Laterality: N/A;  . GANGLION CYST EXCISION Right 02/2008   foot  . KNEE ARTHROSCOPY WITH LATERAL MENISECTOMY Left 07/21/2014   Procedure: LEFT KNEE ARTHROSCOPY,  CHONDROPLASTY, WITH LATERAL AND MEDIAL  MENISCECTOMIES;  Surgeon: Kathryne Hitch, MD;  Location: [facility];  Service: Orthopedics;  Laterality: Left;  . KNEE ARTHROSCOPY WITH MEDIAL MENISECTOMY Left 07/21/2014   Procedure: KNEE ARTHROSCOPY WITH MEDIAL MENISECTOMY;  Surgeon: Kathryne Hitch, MD;  Location: [facility];  Service: Orthopedics;  Laterality: Left;  . LAPAROSCOPIC GASTRIC BANDING  02/18/2008  . PARTIAL HYSTERECTOMY  09/07/2002  . SHOULDER ARTHROSCOPY WITH SUBACROMIAL DECOMPRESSION, ROTATOR CUFF REPAIR AND BICEP TENDON REPAIR Right 11/23/2015   Procedure: RIGHT SHOULDER ARTHROSCOPY WITH DEBRIDEMENT, SUBACROMIAL DECOMPRESSION, DISTAL CLAVICLE EXCISION, ROTATOR CUFF REPAIR AND BICEP TENODESIS;  Surgeon: Ninetta Lights, MD;  Location: [facility];  Service: Orthopedics;  Laterality: Right;     reports that she has been smoking cigarettes.  She has a 17.50  pack-year smoking history. she has never used smokeless tobacco. She reports that she drinks alcohol. She reports that she does not use drugs.  Allergies  Allergen Reactions  . Bee Venom Shortness Of Breath    Family history: Family history was reviewed and is not pertinent  Prior to Admission medications   Medication Sig Start Date End Date Taking? Authorizing  Provider  doxycycline (VIBRAMYCIN) 100 MG capsule Take 1 capsule (100 mg total) by mouth 2 (two) times daily. One po bid x 7 days 02/02/17   Mesner, Corene Cornea, MD  estradiol (ESTRACE) 1 MG tablet Take 1 mg by mouth daily.     [provider]  levothyroxine (SYNTHROID, LEVOTHROID) 125 MCG tablet Take 125 mcg by mouth daily before breakfast.    [provider]  meloxicam (MOBIC) 7.5 MG tablet Take 1 tablet (7.5 mg total) by mouth daily. 02/02/17   Mesner, Corene Cornea, MD  omeprazole (PRILOSEC) 40 MG capsule Take 40 mg by mouth daily.    [provider]  sucralfate (CARAFATE) 1 GM/10ML suspension Take 10 mLs (1 g total) by mouth at bedtime as needed. 12/24/16   Tanda Rockers, MD  tolterodine (DETROL LA) 4 MG 24 hr capsule Take 4 mg by mouth daily.    [provider]    Physical Exam: Vitals:   02/14/17 0415 02/14/17 0430 02/14/17 0632 02/14/17 0700  BP:  (!) 147/75 (!) 143/77 (!) 151/72  Pulse: 73 74 67 70  Resp: (!) 28 (!) 28 (!) 25 (!) 30  SpO2: 96% 95% 99% 96%  Weight:      Height:        Constitutional: NAD, calm, comfortable Vitals:   02/14/17 0415 02/14/17 0430 02/14/17 0632 02/14/17 0700  BP:  (!) 147/75 (!) 143/77 (!) 151/72  Pulse: 73 74 67 70  Resp: (!) 28 (!) 28 (!) 25 (!) 30  SpO2: 96% 95% 99% 96%  Weight:      Height:       Eyes: PERRL, lids and conjunctivae normal ENMT: Mucous membranes are moist. Posterior pharynx clear of any exudate or lesions.Normal dentition.  Neck: normal, supple, no masses, no thyromegaly Respiratory: clear to auscultation bilaterally, no wheezing, no crackles. Normal respiratory effort. No accessory muscle use.  Cardiovascular: Regular rate and rhythm, no murmurs / rubs / gallops. No extremity edema. 2+ pedal pulses. No carotid bruits.  Abdomen: no tenderness, no masses palpated. No hepatosplenomegaly. Bowel sounds positive.  Musculoskeletal: no clubbing / cyanosis. No joint deformity upper and lower extremities. Good  ROM, no contractures. Normal muscle tone.  Skin: no rashes, lesions, ulcers. No induration Neurologic: CN 2-12 grossly intact. Sensation intact, DTR normal. Strength 5/5 in all 4.  Psychiatric: Normal judgment and insight. Alert and oriented x 3. Normal mood.   Labs on Admission: I have personally reviewed following labs and imaging studies  CBC: Recent Labs  Lab 02/14/17 0315 02/14/17 0333  WBC 13.8*  --   NEUTROABS 10.6*  --   HGB 13.0 13.9  HCT 40.8 41.0  MCV 84.8  --   PLT 267  --    Basic Metabolic Panel: Recent Labs  Lab 02/14/17 0315 02/14/17 0333  NA 138 141  K 3.1* 3.2*  CL 103 103  CO2 23  --   GLUCOSE 141* 137*  BUN 14 14  CREATININE 0.71 0.70  CALCIUM 8.6*  --    GFR: Estimated Creatinine Clearance: 89.3 mL/min (by C-G formula based on SCr of 0.7 mg/dL). Liver Function Tests:  Recent Labs  Lab 02/14/17 0315  AST 18  ALT 14  ALKPHOS 74  BILITOT 0.3  PROT 7.5  ALBUMIN 3.2*   Recent Labs  Lab 02/14/17 0315  LIPASE 24   No results for input(s): AMMONIA in the last 168 hours. Coagulation Profile: No results for input(s): INR, PROTIME in the last 168 hours. Cardiac Enzymes: Recent Labs  Lab 02/14/17 0315  TROPONINI <0.03   BNP (last 3 results) No results for input(s): PROBNP in the last 8760 hours. HbA1C: No results for input(s): HGBA1C in the last 72 hours. CBG: No results for input(s): GLUCAP in the last 168 hours. Lipid Profile: No results for input(s): CHOL, HDL, LDLCALC, TRIG, CHOLHDL, LDLDIRECT in the last 72 hours. Thyroid Function Tests: No results for input(s): TSH, T4TOTAL, FREET4, T3FREE, THYROIDAB in the last 72 hours. Anemia Panel: No results for input(s): VITAMINB12, FOLATE, FERRITIN, TIBC, IRON, RETICCTPCT in the last 72 hours. Urine analysis:    Component Value Date/Time   COLORURINE YELLOW 07/07/2013 1432   APPEARANCEUR CLEAR 07/07/2013 1432   LABSPEC <1.005 (L) 07/07/2013 1432   PHURINE 5.0 07/07/2013 1432    GLUCOSEU NEGATIVE 07/07/2013 1432   HGBUR TRACE (A) 07/07/2013 1432   BILIRUBINUR NEGATIVE 07/07/2013 1432   KETONESUR NEGATIVE 07/07/2013 1432   PROTEINUR NEGATIVE 07/07/2013 1432   UROBILINOGEN 0.2 07/07/2013 1432   NITRITE NEGATIVE 07/07/2013 1432   LEUKOCYTESUR NEGATIVE 07/07/2013 1432    Radiological Exams on Admission: Dg Chest 2 View  Result Date: 02/12/2017 CLINICAL DATA:  Shortness of Breath EXAM: CHEST  2 VIEW COMPARISON:  02/02/2017 FINDINGS: Cardiac shadow is stable. Patchy changes are noted in the bases again but increased when compared with the prior exam particularly in the left lingula. No sizable effusion is seen. No bony abnormality is noted. IMPRESSION: Increase in left lingular infiltrate when compared with the prior exam. Electronically Signed   By: Inez Catalina M.D.   On: 02/12/2017 16:33   Dg Chest Portable 1 View  Result Date: 02/14/2017 CLINICAL DATA:  Acute onset of sharp generalized chest and back pain. EXAM: PORTABLE CHEST 1 VIEW COMPARISON:  Chest radiograph performed 02/12/2017 FINDINGS: Persistent left basilar airspace opacity is concerning for pneumonia, perhaps slightly worsened from the prior study. No pleural effusion or pneumothorax is seen. The cardiomediastinal silhouette is normal in size. No acute osseous abnormalities are identified. IMPRESSION: Persistent left basilar airspace opacity is concerning for pneumonia, perhaps slightly worsened from the prior study. Electronically Signed   By: Garald Balding M.D.   On: 02/14/2017 03:36   Ct Angio Chest/abd/pel For Dissection W And/or Wo Contrast  Result Date: 02/14/2017 CLINICAL DATA:  Initial evaluation for acute chest pain. EXAM: CT ANGIOGRAPHY CHEST, ABDOMEN AND PELVIS TECHNIQUE: Multidetector CT imaging through the chest, abdomen and pelvis was performed using the standard protocol during bolus administration of intravenous contrast. Multiplanar reconstructed images and MIPs were obtained and reviewed to  evaluate the vascular anatomy. CONTRAST:  134mL ISOVUE-370 IOPAMIDOL (ISOVUE-370) INJECTION 76% COMPARISON:  Prior radiograph from earlier the same day. FINDINGS: CTA CHEST FINDINGS Cardiovascular: Precontrast imaging through the intrathoracic aorta at demonstrates scattered plaque within the aortic arch and descending intrathoracic aorta. No mural thrombus or other acute abnormality. Postcontrast imaging demonstrates no evidence for dissection or other acute abnormality. Visualized great vessels within normal limits. Transverse heart size at the upper limits of normal. No pericardial effusion. Limited evaluation of the pulmonary arterial tree grossly unremarkable. Mediastinum/Nodes: Thyroid normal. No pathologically enlarged mediastinal lymph nodes. Mildly prominent  bilateral hilar nodes measuring up to 9 mm, suspected to be reactive. No axillary adenopathy. Prominent circumferential esophageal wall thickening with intraluminal fluid density within the esophageal lumen. Lungs/Pleura: Tracheobronchial tree intact and patent. Patchy multifocal parenchymal opacities within the lingula and left lower lobe, concerning for possible pneumonia. Additional scattered opacities within the left upper lobe and right middle lobe, also concerning for infection/infiltrates. Superimposed scattered atelectatic changes within the right middle and left lower lobes. No pulmonary edema or pleural effusion. No pneumothorax. Musculoskeletal: No acute osseous abnormality. No worrisome lytic or blastic osseous lesions. Review of the MIP images confirms the above findings. CTA ABDOMEN AND PELVIS FINDINGS VASCULAR Aorta: Normal intravascular enhancement seen throughout the intra-abdominal aorta. No evidence for aneurysm. Moderate mixed atheromatous plaque within the infrarenal aorta. No evidence for dissection. Celiac: Normal intravascular enhancement seen throughout the celiac axis and its branch vessels. SMA: SMA widely patent to its distal  aspect. Renals: Single renal arteries present bilaterally, both of which are widely patent and well opacified. IMA: IMA patent and well opacified. Inflow: Iliac arteries well opacified and widely patent bilaterally. Veins: No venous abnormality within the abdomen and pelvis. Review of the MIP images confirms the above findings. NON-VASCULAR Hepatobiliary: Liver demonstrates a normal contrast enhanced appearance. Gallbladder within normal limits. No biliary dilatation. Pancreas: Pancreas within normal limits. Spleen: Spleen within normal limits. Adrenals/Urinary Tract: Adrenal glands are normal. Kidneys equal in size with symmetric enhancement. No nephrolithiasis, hydronephrosis or focal enhancing renal mass. No hydroureter. Partially distended bladder within normal limits. Stomach/Bowel: Gastric lap band in place. Stomach otherwise unremarkable. No evidence for bowel obstruction. Appendix is normal. Mild colonic diverticulosis. No acute inflammatory changes about the bowels. Lymphatic: No adenopathy within the abdomen and pelvis. Reproductive: Uterus is absent.  Ovaries within normal limits. Other: No free air or fluid. Reservoir for gastric lap band appears well positioned within the subcutaneous fat of the right ventral abdomen. Musculoskeletal: No acute osseous abnormality. No worrisome lytic or blastic osseous lesions. Moderate degenerate spondylolysis noted at L1-2, L2-3, and L5-S1. Review of the MIP images confirms the above findings. IMPRESSION: 1. No CT evidence for dissection or other acute aortic pathology. No aneurysm. 2. Multifocal parenchymal opacities involving the bilateral lungs, greatest within the lingula and left lower lobe, concerning for multifocal pneumonia. 3. Circumferential esophageal wall thickening with intraluminal fluid density. Finding may reflect sequelae of reflux disease, acute esophagitis, and/or changes related to recent vomiting. This finding may also in part be related to the  presence of the gastric lap band. 4. No acute abnormality within the abdomen and pelvis. 5. Moderate atherosclerosis as above.  No aneurysm. Electronically Signed   By: Jeannine Boga M.D.   On: 02/14/2017 04:37    EKG: Independently reviewed.  Sinus rhythm without any acute ischemic changes.  Assessment/Plan Active Problems:   History of laparoscopic adjustable gastric banding, 02/17/2008   Chronic cough   Chest pain   Multifocal pneumonia   Hypothyroidism   GERD (gastroesophageal reflux disease)   Pneumonia    1. Multifocal pneumonia.  I completed doxycycline as an outpatient but had failed outpatient treatment.  Will start on ceftriaxone and azithromycin.  Continue mucolytic's and bronchodilators.  She is not requiring oxygen at this time.  She will need a repeat chest x-ray in 3-4 weeks. 2. Chest pain, pleuritic.  Related to #1.  Cardiac enzymes have thus far been negative.  Monitor on telemetry for 24 hours. 3. Hypothyroidism.  Continue on Synthroid 4. Chronic cough.  Likely  related to GERD status post lap band procedure. 5. Status post lap band procedure.  Follow-up with bariatric surgery.  DVT prophylaxis: Lovenox Code Status: Full code Family Communication: No family present Disposition Plan: Anticipate discharge home in the next 24 hours if she continues to improve Consults called:  Admission status: observation, tele   Kathie Dike MD [facility] Pager [phone]  If 7PM-7AM, please contact night-coverage [email or web address] Password [email or web address]  02/14/2017, 9:15 AM

## 2017-02-14 NOTE — ED Notes (Signed)
Ambulated pt with pulse oximetry.Oxygen level started at 98%,full circle around nursing station oxygen level ended at 100%

## 2017-02-14 NOTE — ED Notes (Signed)
Pt resting quietly.  Easily arouses.  No distress. States her pain is better

## 2017-02-15 DIAGNOSIS — Z9884 Bariatric surgery status: Secondary | ICD-10-CM

## 2017-02-15 DIAGNOSIS — K219 Gastro-esophageal reflux disease without esophagitis: Secondary | ICD-10-CM | POA: Diagnosis not present

## 2017-02-15 DIAGNOSIS — E039 Hypothyroidism, unspecified: Secondary | ICD-10-CM | POA: Diagnosis not present

## 2017-02-15 DIAGNOSIS — R079 Chest pain, unspecified: Secondary | ICD-10-CM | POA: Diagnosis not present

## 2017-02-15 LAB — CBC
HEMATOCRIT: 36.7 % (ref 36.0–46.0)
HEMOGLOBIN: 11.6 g/dL — AB (ref 12.0–15.0)
MCH: 27.1 pg (ref 26.0–34.0)
MCHC: 31.6 g/dL (ref 30.0–36.0)
MCV: 85.7 fL (ref 78.0–100.0)
Platelets: 264 10*3/uL (ref 150–400)
RBC: 4.28 MIL/uL (ref 3.87–5.11)
RDW: 14.9 % (ref 11.5–15.5)
WBC: 8.5 10*3/uL (ref 4.0–10.5)

## 2017-02-15 LAB — BASIC METABOLIC PANEL
ANION GAP: 8 (ref 5–15)
BUN: 12 mg/dL (ref 6–20)
CO2: 25 mmol/L (ref 22–32)
Calcium: 8.6 mg/dL — ABNORMAL LOW (ref 8.9–10.3)
Chloride: 105 mmol/L (ref 101–111)
Creatinine, Ser: 0.7 mg/dL (ref 0.44–1.00)
Glucose, Bld: 100 mg/dL — ABNORMAL HIGH (ref 65–99)
POTASSIUM: 4.5 mmol/L (ref 3.5–5.1)
SODIUM: 138 mmol/L (ref 135–145)

## 2017-02-15 LAB — EXPECTORATED SPUTUM ASSESSMENT W REFEX TO RESP CULTURE

## 2017-02-15 LAB — HIV ANTIBODY (ROUTINE TESTING W REFLEX): HIV SCREEN 4TH GENERATION: NONREACTIVE

## 2017-02-15 MED ORDER — ALBUTEROL SULFATE HFA 108 (90 BASE) MCG/ACT IN AERS: 2.0000 | INHALATION_SPRAY | Freq: Four times a day (QID) | RESPIRATORY_TRACT | 2 refills | Status: DC | PRN

## 2017-02-15 MED ORDER — OMEPRAZOLE 40 MG PO CPDR: 40.0000 mg | DELAYED_RELEASE_CAPSULE | Freq: Every day | ORAL | 0 refills | Status: DC

## 2017-02-15 MED ORDER — AMOXICILLIN-POT CLAVULANATE 875-125 MG PO TABS: 1.0000 | ORAL_TABLET | Freq: Two times a day (BID) | ORAL | 0 refills | Status: AC

## 2017-02-15 NOTE — Discharge Summary (Signed)
Physician Discharge Summary  SEBRENA ENGH ZOX:[id number] DOB: Jun 12, 1960 DOA: 02/14/2017  PCP: Sharilyn Sites, MD  Admit date: 02/14/2017 Discharge date: 02/15/2017  Admitted From: Home Disposition: Home  Recommendations for Outpatient Follow-up:  1. Follow up with PCP in 1-2 weeks 2. Please obtain BMP/CBC in one week 3. Repeat chest x-ray in 3-4 weeks to ensure resolution of pneumonia  Home Health: Equipment/Devices:  Discharge Condition: stable CODE STATUS: full code Diet recommendation: Heart Healthy  Brief/Interim Summary: 57 year old female with a history of hypothyroidism, GERD, history of lap band procedure in the past, presented to the hospital with chest discomfort, cough, shortness of breath.  She was found to have evidence of multifocal pneumonia.  Her chest discomfort was likely pleuritic related to her underlying infection.  She had a CT Angio of the chest done that was negative for pulmonary embolus.  Patient already completed a course of doxycycline as an outpatient.  She was admitted to the hospital and started on ceftriaxone and azithromycin.  She also received supportive therapy with bronchodilators and mucolytic's.  The following day, she felt significantly improved.  She is not having any fevers and overall shortness of breath had resolved.  She will be transitioned to a course of Augmentin.  She feels well enough to discharge home.  Discharge Diagnoses:  Active Problems:   History of laparoscopic adjustable gastric banding, 02/17/2008   Chronic cough   Chest pain   Multifocal pneumonia   Hypothyroidism   GERD (gastroesophageal reflux disease)   Pneumonia    Discharge Instructions  Discharge Instructions    Diet - low sodium heart healthy   Complete by:  As directed    Increase activity slowly   Complete by:  As directed      Allergies as of 02/15/2017      Reactions   Bee Venom Shortness Of Breath      Medication List    STOP taking these medications    doxycycline 100 MG capsule Commonly known as:  VIBRAMYCIN     TAKE these medications   albuterol 108 (90 Base) MCG/ACT inhaler Commonly known as:  PROVENTIL HFA;VENTOLIN HFA Inhale 2 puffs into the lungs every 6 (six) hours as needed for wheezing or shortness of breath.   amoxicillin-clavulanate 875-125 MG tablet Commonly known as:  AUGMENTIN Take 1 tablet by mouth 2 (two) times daily for 5 days.   estradiol 1 MG tablet Commonly known as:  ESTRACE Take 1 mg by mouth daily.   ibuprofen 200 MG tablet Commonly known as:  ADVIL,MOTRIN Take 800 mg by mouth every 6 (six) hours as needed for moderate pain.   levothyroxine 125 MCG tablet Commonly known as:  SYNTHROID, LEVOTHROID Take 125 mcg by mouth daily before breakfast.   omeprazole 40 MG capsule Commonly known as:  PRILOSEC Take 1 capsule (40 mg total) by mouth daily.   tolterodine 4 MG 24 hr capsule Commonly known as:  DETROL LA Take 4 mg by mouth daily.       Allergies  Allergen Reactions  . Bee Venom Shortness Of Breath    Consultations:     Procedures/Studies: Dg Chest 2 View  Result Date: 02/12/2017 CLINICAL DATA:  Shortness of Breath EXAM: CHEST  2 VIEW COMPARISON:  02/02/2017 FINDINGS: Cardiac shadow is stable. Patchy changes are noted in the bases again but increased when compared with the prior exam particularly in the left lingula. No sizable effusion is seen. No bony abnormality is noted. IMPRESSION: Increase in left lingular infiltrate  when compared with the prior exam. Electronically Signed   By: Inez Catalina M.D.   On: 02/12/2017 16:33   Dg Chest 2 View  Result Date: 02/02/2017 CLINICAL DATA:  nonproductive cough, fever, and chills for 3 days. EXAM: CHEST  2 VIEW COMPARISON:  08/06/2016 FINDINGS: New bibasilar pulmonary opacity is seen, right side greater than left. No evidence of pleural effusion. Heart size and mediastinal contours are normal. IMPRESSION: New bibasilar pulmonary opacity, consistent  with pneumonia. Recommend followup PA and lateral chest X-ray in several weeks to ensure resolution and exclude underlying malignancy. Electronically Signed   By: Earle Gell M.D.   On: 02/02/2017 22:37   Dg Chest Portable 1 View  Result Date: 02/14/2017 CLINICAL DATA:  Acute onset of sharp generalized chest and back pain. EXAM: PORTABLE CHEST 1 VIEW COMPARISON:  Chest radiograph performed 02/12/2017 FINDINGS: Persistent left basilar airspace opacity is concerning for pneumonia, perhaps slightly worsened from the prior study. No pleural effusion or pneumothorax is seen. The cardiomediastinal silhouette is normal in size. No acute osseous abnormalities are identified. IMPRESSION: Persistent left basilar airspace opacity is concerning for pneumonia, perhaps slightly worsened from the prior study. Electronically Signed   By: Garald Balding M.D.   On: 02/14/2017 03:36   Ct Angio Chest/abd/pel For Dissection W And/or Wo Contrast  Result Date: 02/14/2017 CLINICAL DATA:  Initial evaluation for acute chest pain. EXAM: CT ANGIOGRAPHY CHEST, ABDOMEN AND PELVIS TECHNIQUE: Multidetector CT imaging through the chest, abdomen and pelvis was performed using the standard protocol during bolus administration of intravenous contrast. Multiplanar reconstructed images and MIPs were obtained and reviewed to evaluate the vascular anatomy. CONTRAST:  192mL ISOVUE-370 IOPAMIDOL (ISOVUE-370) INJECTION 76% COMPARISON:  Prior radiograph from earlier the same day. FINDINGS: CTA CHEST FINDINGS Cardiovascular: Precontrast imaging through the intrathoracic aorta at demonstrates scattered plaque within the aortic arch and descending intrathoracic aorta. No mural thrombus or other acute abnormality. Postcontrast imaging demonstrates no evidence for dissection or other acute abnormality. Visualized great vessels within normal limits. Transverse heart size at the upper limits of normal. No pericardial effusion. Limited evaluation of the  pulmonary arterial tree grossly unremarkable. Mediastinum/Nodes: Thyroid normal. No pathologically enlarged mediastinal lymph nodes. Mildly prominent bilateral hilar nodes measuring up to 9 mm, suspected to be reactive. No axillary adenopathy. Prominent circumferential esophageal wall thickening with intraluminal fluid density within the esophageal lumen. Lungs/Pleura: Tracheobronchial tree intact and patent. Patchy multifocal parenchymal opacities within the lingula and left lower lobe, concerning for possible pneumonia. Additional scattered opacities within the left upper lobe and right middle lobe, also concerning for infection/infiltrates. Superimposed scattered atelectatic changes within the right middle and left lower lobes. No pulmonary edema or pleural effusion. No pneumothorax. Musculoskeletal: No acute osseous abnormality. No worrisome lytic or blastic osseous lesions. Review of the MIP images confirms the above findings. CTA ABDOMEN AND PELVIS FINDINGS VASCULAR Aorta: Normal intravascular enhancement seen throughout the intra-abdominal aorta. No evidence for aneurysm. Moderate mixed atheromatous plaque within the infrarenal aorta. No evidence for dissection. Celiac: Normal intravascular enhancement seen throughout the celiac axis and its branch vessels. SMA: SMA widely patent to its distal aspect. Renals: Single renal arteries present bilaterally, both of which are widely patent and well opacified. IMA: IMA patent and well opacified. Inflow: Iliac arteries well opacified and widely patent bilaterally. Veins: No venous abnormality within the abdomen and pelvis. Review of the MIP images confirms the above findings. NON-VASCULAR Hepatobiliary: Liver demonstrates a normal contrast enhanced appearance. Gallbladder within normal limits. No biliary  dilatation. Pancreas: Pancreas within normal limits. Spleen: Spleen within normal limits. Adrenals/Urinary Tract: Adrenal glands are normal. Kidneys equal in size  with symmetric enhancement. No nephrolithiasis, hydronephrosis or focal enhancing renal mass. No hydroureter. Partially distended bladder within normal limits. Stomach/Bowel: Gastric lap band in place. Stomach otherwise unremarkable. No evidence for bowel obstruction. Appendix is normal. Mild colonic diverticulosis. No acute inflammatory changes about the bowels. Lymphatic: No adenopathy within the abdomen and pelvis. Reproductive: Uterus is absent.  Ovaries within normal limits. Other: No free air or fluid. Reservoir for gastric lap band appears well positioned within the subcutaneous fat of the right ventral abdomen. Musculoskeletal: No acute osseous abnormality. No worrisome lytic or blastic osseous lesions. Moderate degenerate spondylolysis noted at L1-2, L2-3, and L5-S1. Review of the MIP images confirms the above findings. IMPRESSION: 1. No CT evidence for dissection or other acute aortic pathology. No aneurysm. 2. Multifocal parenchymal opacities involving the bilateral lungs, greatest within the lingula and left lower lobe, concerning for multifocal pneumonia. 3. Circumferential esophageal wall thickening with intraluminal fluid density. Finding may reflect sequelae of reflux disease, acute esophagitis, and/or changes related to recent vomiting. This finding may also in part be related to the presence of the gastric lap band. 4. No acute abnormality within the abdomen and pelvis. 5. Moderate atherosclerosis as above.  No aneurysm. Electronically Signed   By: Jeannine Boga M.D.   On: 02/14/2017 04:37      Subjective: Feeling better.  Still has mild cough.  Chest pain is better.  Shortness of breath has resolved.  Discharge Exam: Vitals:   02/15/17 0511 02/15/17 0757  BP: 140/69   Pulse: 63   Resp: 15   Temp: 98.4 F (36.9 C)   SpO2: 96% 97%   Vitals:   02/14/17 1959 02/14/17 2311 02/15/17 0511 02/15/17 0757  BP:  (!) 154/63 140/69   Pulse:  67 63   Resp:  20 15   Temp:  99.1 F  (37.3 C) 98.4 F (36.9 C)   TempSrc:  Axillary Oral   SpO2: 93% 93% 96% 97%  Weight:      Height:        General: Pt is alert, awake, not in acute distress Cardiovascular: RRR, S1/S2 +, no rubs, no gallops Respiratory: CTA bilaterally, no wheezing, no rhonchi Abdominal: Soft, NT, ND, bowel sounds + Extremities: no edema, no cyanosis    The results of significant diagnostics from this hospitalization (including imaging, microbiology, ancillary and laboratory) are listed below for reference.     Microbiology: Recent Results (from the past 240 hour(s))  Blood culture (routine x 2)     Status: None (Preliminary result)   Collection Time: 02/14/17  5:27 AM  Result Value Ref Range Status   Specimen Description   Final    BLOOD RIGHT HAND BOTTLES DRAWN AEROBIC AND ANAEROBIC   Special Requests Blood Culture adequate volume  Final   Culture   Final    NO GROWTH 1 DAY Performed at [facility], [address] [place], [place] [address]    Report Status PENDING  Incomplete  Blood culture (routine x 2)     Status: None (Preliminary result)   Collection Time: 02/14/17  5:29 AM  Result Value Ref Range Status   Specimen Description BLOOD RIGHT HAND BOTTLES DRAWN AEROBIC ONLY  Final   Special Requests   Final    Blood Culture results may not be optimal due to an inadequate volume of blood received in culture bottles  Culture   Final    NO GROWTH 1 DAY Performed at [facility], [address], [place], [place] [address]    Report Status PENDING  Incomplete     Labs: BNP (last 3 results) No results for input(s): BNP in the last 8760 hours. Basic Metabolic Panel: Recent Labs  Lab 02/14/17 0315 02/14/17 0333 02/15/17 0630  NA 138 141 138  K 3.1* 3.2* 4.5  CL 103 103 105  CO2 23  --  25  GLUCOSE 141* 137* 100*  BUN 14 14 12   CREATININE 0.71 0.70 0.70  CALCIUM 8.6*  --  8.6*   Liver Function Tests: Recent Labs  Lab 02/14/17 0315  AST 18  ALT 14  ALKPHOS 74  BILITOT  0.3  PROT 7.5  ALBUMIN 3.2*   Recent Labs  Lab 02/14/17 0315  LIPASE 24   No results for input(s): AMMONIA in the last 168 hours. CBC: Recent Labs  Lab 02/14/17 0315 02/14/17 0333 02/15/17 0630  WBC 13.8*  --  8.5  NEUTROABS 10.6*  --   --   HGB 13.0 13.9 11.6*  HCT 40.8 41.0 36.7  MCV 84.8  --  85.7  PLT 267  --  264   Cardiac Enzymes: Recent Labs  Lab 02/14/17 0315 02/14/17 0959 02/14/17 1506 02/14/17 2102  TROPONINI <0.03 <0.03 <0.03 <0.03   BNP: Invalid input(s): POCBNP CBG: No results for input(s): GLUCAP in the last 168 hours. D-Dimer Recent Labs    02/14/17 0315  DDIMER 0.43   Hgb A1c No results for input(s): HGBA1C in the last 72 hours. Lipid Profile No results for input(s): CHOL, HDL, LDLCALC, TRIG, CHOLHDL, LDLDIRECT in the last 72 hours. Thyroid function studies No results for input(s): TSH, T4TOTAL, T3FREE, THYROIDAB in the last 72 hours.  Invalid input(s): FREET3 Anemia work up No results for input(s): VITAMINB12, FOLATE, FERRITIN, TIBC, IRON, RETICCTPCT in the last 72 hours. Urinalysis    Component Value Date/Time   COLORURINE YELLOW 07/07/2013 1432   APPEARANCEUR CLEAR 07/07/2013 1432   LABSPEC <1.005 (L) 07/07/2013 1432   PHURINE 5.0 07/07/2013 1432   GLUCOSEU NEGATIVE 07/07/2013 1432   HGBUR TRACE (A) 07/07/2013 1432   BILIRUBINUR NEGATIVE 07/07/2013 1432   KETONESUR NEGATIVE 07/07/2013 1432   PROTEINUR NEGATIVE 07/07/2013 1432   UROBILINOGEN 0.2 07/07/2013 1432   NITRITE NEGATIVE 07/07/2013 1432   LEUKOCYTESUR NEGATIVE 07/07/2013 1432   Sepsis Labs Invalid input(s): PROCALCITONIN,  WBC,  LACTICIDVEN Microbiology Recent Results (from the past 240 hour(s))  Blood culture (routine x 2)     Status: None (Preliminary result)   Collection Time: 02/14/17  5:27 AM  Result Value Ref Range Status   Specimen Description   Final    BLOOD RIGHT HAND BOTTLES DRAWN AEROBIC AND ANAEROBIC   Special Requests Blood Culture adequate volume   Final   Culture   Final    NO GROWTH 1 DAY Performed at [facility], [address], [place], [place] [address]    Report Status PENDING  Incomplete  Blood culture (routine x 2)     Status: None (Preliminary result)   Collection Time: 02/14/17  5:29 AM  Result Value Ref Range Status   Specimen Description BLOOD RIGHT HAND BOTTLES DRAWN AEROBIC ONLY  Final   Special Requests   Final    Blood Culture results may not be optimal due to an inadequate volume of blood received in culture bottles   Culture   Final    NO GROWTH 1 DAY  Performed at [facility], [address] [place],  [address]    Report Status PENDING  Incomplete     Time coordinating discharge: Over 30 minutes  SIGNED:   Kathie Dike, MD  [facility] 02/15/2017, 1:04 PM Pager   If 7PM-7AM, please contact night-coverage [email or web address] Password [email or web address]

## 2017-02-15 NOTE — Progress Notes (Signed)
Discharge instructions gone over with patient, verbalized understanding. IV removed, patient tolerated procedure.

## 2017-02-15 NOTE — Plan of Care (Signed)
Pt in bed resting with eyes closed; awaiting sputum and urine specimen. Will continue to monitor.

## 2017-02-18 LAB — CULTURE, RESPIRATORY W GRAM STAIN

## 2017-02-18 LAB — CULTURE, RESPIRATORY: CULTURE: NORMAL

## 2017-02-19 LAB — CULTURE, BLOOD (ROUTINE X 2)
CULTURE: NO GROWTH
CULTURE: NO GROWTH
Special Requests: ADEQUATE

## 2017-02-21 DIAGNOSIS — Z9884 Bariatric surgery status: Secondary | ICD-10-CM | POA: Diagnosis not present

## 2017-02-21 DIAGNOSIS — R1110 Vomiting, unspecified: Secondary | ICD-10-CM | POA: Diagnosis not present

## 2017-03-10 NOTE — Progress Notes (Signed)
Please place orders in Epic as patient has a pre-op appointment on 03/14/2017! Thank you!

## 2017-03-11 ENCOUNTER — Ambulatory Visit: Payer: Self-pay | Admitting: General Surgery

## 2017-03-11 NOTE — Patient Instructions (Signed)
Angela Abbott  03/11/2017   Your procedure is scheduled on: 03/21/2017   Report to [facility] Main  Entrance  Report to admitting at    0730 AM   Call this number if you have problems the morning of surgery [phone]   Remember: Do not eat food or drink liquids :After Midnight.     Take these medicines the morning of surgery with A SIP OF WATER: Synthroid, Prilosec, Detrol                                 You may not have any metal on your body including hair pins and              piercings  Do not wear jewelry, make-up, lotions, powders or perfumes, deodorant             Do not wear nail polish.  Do not shave  48 hours prior to surgery.              .   Do not bring valuables to the hospital. [place].  Contacts, dentures or bridgework may not be worn into surgery.  Leave suitcase in the car. After surgery it may be brought to your room.         Special Instructions: coughing and deep breathing exercises, leg exercises               Please read over the following fact sheets you were given: _____________________________________________________________________             [facility] - Preparing for Surgery Before surgery, you can play an important role.  Because skin is not sterile, your skin needs to be as free of germs as possible.  You can reduce the number of germs on your skin by washing with CHG (chlorahexidine gluconate) soap before surgery.  CHG is an antiseptic cleaner which kills germs and bonds with the skin to continue killing germs even after washing. Please DO NOT use if you have an allergy to CHG or antibacterial soaps.  If your skin becomes reddened/irritated stop using the CHG and inform your nurse when you arrive at Short Stay. Do not shave (including legs and underarms) for at least 48 hours prior to the first CHG shower.  You may shave your face/neck. Please follow these  instructions carefully:  1.  Shower with CHG Soap the night before surgery and the  morning of Surgery.  2.  If you choose to wash your hair, wash your hair first as usual with your  normal  shampoo.  3.  After you shampoo, rinse your hair and body thoroughly to remove the  shampoo.                           4.  Use CHG as you would any other liquid soap.  You can apply chg directly  to the skin and wash                       Gently with a scrungie or clean washcloth.  5.  Apply the CHG Soap to your body ONLY FROM THE NECK DOWN.  Do not use on face/ open                           Wound or open sores. Avoid contact with eyes, ears mouth and genitals (private parts).                       Wash face,  Genitals (private parts) with your normal soap.             6.  Wash thoroughly, paying special attention to the area where your surgery  will be performed.  7.  Thoroughly rinse your body with warm water from the neck down.  8.  DO NOT shower/wash with your normal soap after using and rinsing off  the CHG Soap.                9.  Pat yourself dry with a clean towel.            10.  Wear clean pajamas.            11.  Place clean sheets on your bed the night of your first shower and do not  sleep with pets. Day of Surgery : Do not apply any lotions/deodorants the morning of surgery.  Please wear clean clothes to the hospital/surgery center.  FAILURE TO FOLLOW THESE INSTRUCTIONS MAY RESULT IN THE CANCELLATION OF YOUR SURGERY PATIENT SIGNATURE_________________________________  NURSE SIGNATURE__________________________________  ________________________________________________________________________  WHAT IS A BLOOD TRANSFUSION? Blood Transfusion Information  A transfusion is the replacement of blood or some of its parts. Blood is made up of multiple cells which provide different functions.  Red blood cells carry oxygen and are used for blood loss replacement.  White blood cells fight against  infection.  Platelets control bleeding.  Plasma helps clot blood.  Other blood products are available for specialized needs, such as hemophilia or other clotting disorders. BEFORE THE TRANSFUSION  Who gives blood for transfusions?   Healthy volunteers who are fully evaluated to make sure their blood is safe. This is blood bank blood. Transfusion therapy is the safest it has ever been in the practice of medicine. Before blood is taken from a donor, a complete history is taken to make sure that person has no history of diseases nor engages in risky social behavior (examples are intravenous drug use or sexual activity with multiple partners). The donor's travel history is screened to minimize risk of transmitting infections, such as malaria. The donated blood is tested for signs of infectious diseases, such as HIV and hepatitis. The blood is then tested to be sure it is compatible with you in order to minimize the chance of a transfusion reaction. If you or a relative donates blood, this is often done in anticipation of surgery and is not appropriate for emergency situations. It takes many days to process the donated blood. RISKS AND COMPLICATIONS Although transfusion therapy is very safe and saves many lives, the main dangers of transfusion include:   Getting an infectious disease.  Developing a transfusion reaction. This is an allergic reaction to something in the blood you were given. Every precaution is taken to prevent this. The decision to have a blood transfusion has been considered carefully by your caregiver before blood is given. Blood is not given unless the benefits outweigh the risks. AFTER THE TRANSFUSION  Right after receiving a blood transfusion, you will usually feel much better and more energetic. This is especially  true if your red blood cells have gotten low (anemic). The transfusion raises the level of the red blood cells which carry oxygen, and this usually causes an energy  increase.  The nurse administering the transfusion will monitor you carefully for complications. HOME CARE INSTRUCTIONS  No special instructions are needed after a transfusion. You may find your energy is better. Speak with your caregiver about any limitations on activity for underlying diseases you may have. SEEK MEDICAL CARE IF:   Your condition is not improving after your transfusion.  You develop redness or irritation at the intravenous (IV) site. SEEK IMMEDIATE MEDICAL CARE IF:  Any of the following symptoms occur over the next 12 hours:  Shaking chills.  You have a temperature by mouth above 102 F (38.9 C), not controlled by medicine.  Chest, back, or muscle pain.  People around you feel you are not acting correctly or are confused.  Shortness of breath or difficulty breathing.  Dizziness and fainting.  You get a rash or develop hives.  You have a decrease in urine output.  Your urine turns a dark color or changes to pink, red, or brown. Any of the following symptoms occur over the next 10 days:  You have a temperature by mouth above 102 F (38.9 C), not controlled by medicine.  Shortness of breath.  Weakness after normal activity.  The white part of the eye turns yellow (jaundice).  You have a decrease in the amount of urine or are urinating less often.  Your urine turns a dark color or changes to pink, red, or brown. Document Released: 12/22/1999 Document Revised: 03/18/2011 Document Reviewed: 08/10/2007 ExitCare Patient Information 2014 [place].  _______________________________________________________________________  Incentive Spirometer  An incentive spirometer is a tool that can help keep your lungs clear and active. This tool measures how well you are filling your lungs with each breath. Taking long deep breaths may help reverse or decrease the chance of developing breathing (pulmonary) problems (especially infection) following:  A long  period of time when you are unable to move or be active. BEFORE THE PROCEDURE   If the spirometer includes an indicator to show your best effort, your nurse or respiratory therapist will set it to a desired goal.  If possible, sit up straight or lean slightly forward. Try not to slouch.  Hold the incentive spirometer in an upright position. INSTRUCTIONS FOR USE  1. Sit on the edge of your bed if possible, or sit up as far as you can in bed or on a chair. 2. Hold the incentive spirometer in an upright position. 3. Breathe out normally. 4. Place the mouthpiece in your mouth and seal your lips tightly around it. 5. Breathe in slowly and as deeply as possible, raising the piston or the ball toward the top of the column. 6. Hold your breath for 3-5 seconds or for as long as possible. Allow the piston or ball to fall to the bottom of the column. 7. Remove the mouthpiece from your mouth and breathe out normally. 8. Rest for a few seconds and repeat Steps 1 through 7 at least 10 times every 1-2 hours when you are awake. Take your time and take a few normal breaths between deep breaths. 9. The spirometer may include an indicator to show your best effort. Use the indicator as a goal to work toward during each repetition. 10. After each set of 10 deep breaths, practice coughing to be sure your lungs are clear. If you have  an incision (the cut made at the time of surgery), support your incision when coughing by placing a pillow or rolled up towels firmly against it. Once you are able to get out of bed, walk around indoors and cough well. You may stop using the incentive spirometer when instructed by your caregiver.  RISKS AND COMPLICATIONS  Take your time so you do not get dizzy or light-headed.  If you are in pain, you may need to take or ask for pain medication before doing incentive spirometry. It is harder to take a deep breath if you are having pain. AFTER USE  Rest and breathe slowly and  easily.  It can be helpful to keep track of a log of your progress. Your caregiver can provide you with a simple table to help with this. If you are using the spirometer at home, follow these instructions: [place] IF:   You are having difficultly using the spirometer.  You have trouble using the spirometer as often as instructed.  Your pain medication is not giving enough relief while using the spirometer.  You develop fever of 100.5 F (38.1 C) or higher. SEEK IMMEDIATE MEDICAL CARE IF:   You cough up bloody sputum that had not been present before.  You develop fever of 102 F (38.9 C) or greater.  You develop worsening pain at or near the incision site. MAKE SURE YOU:   Understand these instructions.  Will watch your condition.  Will get help right away if you are not doing well or get worse. Document Released: 05/06/2006 Document Revised: 03/18/2011 Document Reviewed: 07/07/2006 [facility] Patient Information 2014 [place], [place].   ________________________________________________________________________

## 2017-03-14 ENCOUNTER — Other Ambulatory Visit: Payer: Self-pay

## 2017-03-14 ENCOUNTER — Encounter (HOSPITAL_COMMUNITY)
Admission: RE | Admit: 2017-03-14 | Discharge: 2017-03-14 | Disposition: A | Discharge status: Home / Self Care | Payer: 59 | Source: Ambulatory Visit | Attending: General Surgery | Admitting: General Surgery

## 2017-03-14 ENCOUNTER — Ambulatory Visit (HOSPITAL_COMMUNITY)
Admission: RE | Admit: 2017-03-14 | Discharge: 2017-03-14 | Disposition: A | Discharge status: Home / Self Care | Payer: 59 | Source: Ambulatory Visit | Attending: Anesthesiology | Admitting: Anesthesiology

## 2017-03-14 ENCOUNTER — Encounter (HOSPITAL_COMMUNITY): Payer: Self-pay

## 2017-03-14 DIAGNOSIS — Z01810 Encounter for preprocedural cardiovascular examination: Secondary | ICD-10-CM | POA: Insufficient documentation

## 2017-03-14 DIAGNOSIS — Z01812 Encounter for preprocedural laboratory examination: Secondary | ICD-10-CM | POA: Insufficient documentation

## 2017-03-14 DIAGNOSIS — Z9884 Bariatric surgery status: Secondary | ICD-10-CM | POA: Diagnosis not present

## 2017-03-14 DIAGNOSIS — R1310 Dysphagia, unspecified: Secondary | ICD-10-CM | POA: Insufficient documentation

## 2017-03-14 DIAGNOSIS — Z01818 Encounter for other preprocedural examination: Secondary | ICD-10-CM

## 2017-03-14 HISTORY — DX: Pneumonia, unspecified organism: J18.9

## 2017-03-14 LAB — COMPREHENSIVE METABOLIC PANEL
ALT: 12 U/L — AB (ref 14–54)
AST: 17 U/L (ref 15–41)
Albumin: 3.4 g/dL — ABNORMAL LOW (ref 3.5–5.0)
Alkaline Phosphatase: 65 U/L (ref 38–126)
Anion gap: 7 (ref 5–15)
BILIRUBIN TOTAL: 0.6 mg/dL (ref 0.3–1.2)
BUN: 13 mg/dL (ref 6–20)
CALCIUM: 9.2 mg/dL (ref 8.9–10.3)
CHLORIDE: 107 mmol/L (ref 101–111)
CO2: 26 mmol/L (ref 22–32)
CREATININE: 0.68 mg/dL (ref 0.44–1.00)
GFR calc non Af Amer: >60 mL/min (ref >60)
Glucose, Bld: 104 mg/dL — ABNORMAL HIGH (ref 65–99)
Potassium: 4.7 mmol/L (ref 3.5–5.1)
Sodium: 140 mmol/L (ref 135–145)
Total Protein: 7.1 g/dL (ref 6.5–8.1)

## 2017-03-14 LAB — CBC WITH DIFFERENTIAL/PLATELET
Basophils Absolute: 0 10*3/uL (ref 0.0–0.1)
Basophils Relative: 1 %
EOS PCT: 2 %
Eosinophils Absolute: 0.2 10*3/uL (ref 0.0–0.7)
HEMATOCRIT: 39.5 % (ref 36.0–46.0)
Hemoglobin: 12.7 g/dL (ref 12.0–15.0)
LYMPHS ABS: 2.2 10*3/uL (ref 0.7–4.0)
LYMPHS PCT: 29 %
MCH: 27.5 pg (ref 26.0–34.0)
MCHC: 32.2 g/dL (ref 30.0–36.0)
MCV: 85.5 fL (ref 78.0–100.0)
MONO ABS: 0.5 10*3/uL (ref 0.1–1.0)
Monocytes Relative: 7 %
NEUTROS ABS: 4.6 10*3/uL (ref 1.7–7.7)
Neutrophils Relative %: 61 %
PLATELETS: 263 10*3/uL (ref 150–400)
RBC: 4.62 MIL/uL (ref 3.87–5.11)
RDW: 14.9 % (ref 11.5–15.5)
WBC: 7.5 10*3/uL (ref 4.0–10.5)

## 2017-03-14 LAB — ABO/RH: ABO/RH(D): A POS

## 2017-03-14 NOTE — Progress Notes (Signed)
DR Fransisco Beau ( anesthesia) made aware of recent pneumonia diagnosis on 02/14/2017 by Ct of chest.  Patient in today for preop appt site 98% no cough or congestion.  Repeat CXR obtained and results shown to Dr Fransisco Beau.  No new orders given.

## 2017-03-14 NOTE — Progress Notes (Signed)
EKG-02/15/2916-epic  12/24/2016-LOV-pulm-epic

## 2017-03-21 ENCOUNTER — Ambulatory Visit (HOSPITAL_COMMUNITY)
Admission: RE | Disposition: A | Discharge status: Home / Self Care | Payer: Self-pay | Source: Ambulatory Visit | Attending: General Surgery

## 2017-03-21 ENCOUNTER — Ambulatory Visit (HOSPITAL_COMMUNITY): Payer: 59

## 2017-03-21 ENCOUNTER — Encounter (HOSPITAL_COMMUNITY): Payer: Self-pay | Admitting: *Deleted

## 2017-03-21 ENCOUNTER — Ambulatory Visit (HOSPITAL_COMMUNITY)
Admission: RE | Admit: 2017-03-21 | Discharge: 2017-03-21 | Disposition: A | Discharge status: Home / Self Care | Payer: 59 | Source: Ambulatory Visit | Attending: General Surgery | Admitting: General Surgery

## 2017-03-21 DIAGNOSIS — E669 Obesity, unspecified: Secondary | ICD-10-CM | POA: Insufficient documentation

## 2017-03-21 DIAGNOSIS — Z79899 Other long term (current) drug therapy: Secondary | ICD-10-CM | POA: Diagnosis not present

## 2017-03-21 DIAGNOSIS — E039 Hypothyroidism, unspecified: Secondary | ICD-10-CM | POA: Insufficient documentation

## 2017-03-21 DIAGNOSIS — T85898A Other specified complication of other internal prosthetic devices, implants and grafts, initial encounter: Secondary | ICD-10-CM | POA: Diagnosis not present

## 2017-03-21 DIAGNOSIS — R05 Cough: Secondary | ICD-10-CM | POA: Insufficient documentation

## 2017-03-21 DIAGNOSIS — K219 Gastro-esophageal reflux disease without esophagitis: Secondary | ICD-10-CM | POA: Insufficient documentation

## 2017-03-21 DIAGNOSIS — R1312 Dysphagia, oropharyngeal phase: Secondary | ICD-10-CM | POA: Diagnosis not present

## 2017-03-21 DIAGNOSIS — Z79890 Hormone replacement therapy (postmenopausal): Secondary | ICD-10-CM | POA: Insufficient documentation

## 2017-03-21 DIAGNOSIS — Z4651 Encounter for fitting and adjustment of gastric lap band: Secondary | ICD-10-CM | POA: Insufficient documentation

## 2017-03-21 DIAGNOSIS — F17200 Nicotine dependence, unspecified, uncomplicated: Secondary | ICD-10-CM | POA: Insufficient documentation

## 2017-03-21 DIAGNOSIS — Z91030 Bee allergy status: Secondary | ICD-10-CM | POA: Diagnosis not present

## 2017-03-21 DIAGNOSIS — J189 Pneumonia, unspecified organism: Secondary | ICD-10-CM | POA: Diagnosis not present

## 2017-03-21 DIAGNOSIS — Z6834 Body mass index (BMI) 34.0-34.9, adult: Secondary | ICD-10-CM | POA: Diagnosis not present

## 2017-03-21 HISTORY — PX: LAPAROSCOPIC REPAIR AND REMOVAL OF GASTRIC BAND: SHX5919

## 2017-03-21 LAB — TYPE AND SCREEN
ABO/RH(D): A POS
Antibody Screen: NEGATIVE

## 2017-03-21 SURGERY — LAPAROSCOPIC REPAIR AND REMOVAL OF GASTRIC BAND
Anesthesia: General

## 2017-03-21 MED ORDER — LIDOCAINE 2% (20 MG/ML) 5 ML SYRINGE
INTRAMUSCULAR | Status: DC | PRN
  Administered 2017-03-21: 60 mg via INTRAVENOUS

## 2017-03-21 MED ORDER — OXYCODONE HCL 5 MG PO TABS: 5.0000 mg | ORAL_TABLET | Freq: Four times a day (QID) | ORAL | 0 refills | Status: DC | PRN

## 2017-03-21 MED ORDER — APREPITANT 40 MG PO CAPS
40.0000 mg | ORAL_CAPSULE | ORAL | Status: AC
  Administered 2017-03-21: 40 mg via ORAL
  Filled 2017-03-21: qty 1

## 2017-03-21 MED ORDER — DEXAMETHASONE SODIUM PHOSPHATE 10 MG/ML IJ SOLN
INTRAMUSCULAR | Status: DC | PRN
  Administered 2017-03-21: 8 mg via INTRAVENOUS

## 2017-03-21 MED ORDER — PROPOFOL 10 MG/ML IV BOLUS
INTRAVENOUS | Status: DC | PRN
  Administered 2017-03-21: 150 mg via INTRAVENOUS

## 2017-03-21 MED ORDER — CHLORHEXIDINE GLUCONATE 4 % EX LIQD: 60.0000 mL | Freq: Once | CUTANEOUS | Status: DC

## 2017-03-21 MED ORDER — FENTANYL CITRATE (PF) 100 MCG/2ML IJ SOLN
25.0000 ug | INTRAMUSCULAR | Status: DC | PRN
  Administered 2017-03-21: 50 ug via INTRAVENOUS

## 2017-03-21 MED ORDER — SUCCINYLCHOLINE CHLORIDE 200 MG/10ML IV SOSY
PREFILLED_SYRINGE | INTRAVENOUS | Status: DC | PRN
  Administered 2017-03-21: 100 mg via INTRAVENOUS

## 2017-03-21 MED ORDER — SUCCINYLCHOLINE CHLORIDE 200 MG/10ML IV SOSY
PREFILLED_SYRINGE | INTRAVENOUS | Status: AC
  Filled 2017-03-21: qty 20

## 2017-03-21 MED ORDER — MIDAZOLAM HCL 2 MG/2ML IJ SOLN
INTRAMUSCULAR | Status: AC
  Filled 2017-03-21: qty 2

## 2017-03-21 MED ORDER — MIDAZOLAM HCL 2 MG/2ML IJ SOLN
INTRAMUSCULAR | Status: DC | PRN
  Administered 2017-03-21 (×2): 1 mg via INTRAVENOUS

## 2017-03-21 MED ORDER — CEFOTETAN DISODIUM-DEXTROSE 2-2.08 GM-%(50ML) IV SOLR
2.0000 g | INTRAVENOUS | Status: AC
  Administered 2017-03-21: 2 g via INTRAVENOUS
  Filled 2017-03-21: qty 50

## 2017-03-21 MED ORDER — DEXAMETHASONE SODIUM PHOSPHATE 10 MG/ML IJ SOLN
INTRAMUSCULAR | Status: AC
  Filled 2017-03-21: qty 2

## 2017-03-21 MED ORDER — KETAMINE HCL 10 MG/ML IJ SOLN
INTRAMUSCULAR | Status: DC | PRN
  Administered 2017-03-21: 30 mg via INTRAVENOUS

## 2017-03-21 MED ORDER — KETAMINE HCL 10 MG/ML IJ SOLN
INTRAMUSCULAR | Status: AC
  Filled 2017-03-21: qty 1

## 2017-03-21 MED ORDER — PHENYLEPHRINE 40 MCG/ML (10ML) SYRINGE FOR IV PUSH (FOR BLOOD PRESSURE SUPPORT)
PREFILLED_SYRINGE | INTRAVENOUS | Status: DC | PRN
  Administered 2017-03-21 (×2): 80 ug via INTRAVENOUS

## 2017-03-21 MED ORDER — HEPARIN SODIUM (PORCINE) 5000 UNIT/ML IJ SOLN
5000.0000 [IU] | INTRAMUSCULAR | Status: AC
  Administered 2017-03-21: 5000 [IU] via SUBCUTANEOUS
  Filled 2017-03-21: qty 1

## 2017-03-21 MED ORDER — ROCURONIUM BROMIDE 10 MG/ML (PF) SYRINGE
PREFILLED_SYRINGE | INTRAVENOUS | Status: DC | PRN
  Administered 2017-03-21: 5 mg via INTRAVENOUS
  Administered 2017-03-21: 30 mg via INTRAVENOUS
  Administered 2017-03-21: 10 mg via INTRAVENOUS

## 2017-03-21 MED ORDER — LIDOCAINE 2% (20 MG/ML) 5 ML SYRINGE
INTRAMUSCULAR | Status: AC
  Filled 2017-03-21: qty 20

## 2017-03-21 MED ORDER — PHENYLEPHRINE 40 MCG/ML (10ML) SYRINGE FOR IV PUSH (FOR BLOOD PRESSURE SUPPORT)
PREFILLED_SYRINGE | INTRAVENOUS | Status: AC
  Filled 2017-03-21: qty 20

## 2017-03-21 MED ORDER — SUGAMMADEX SODIUM 200 MG/2ML IV SOLN
INTRAVENOUS | Status: AC
  Filled 2017-03-21: qty 4

## 2017-03-21 MED ORDER — FENTANYL CITRATE (PF) 250 MCG/5ML IJ SOLN
INTRAMUSCULAR | Status: DC | PRN
  Administered 2017-03-21 (×2): 50 ug via INTRAVENOUS
  Administered 2017-03-21: 100 ug via INTRAVENOUS
  Administered 2017-03-21: 50 ug via INTRAVENOUS

## 2017-03-21 MED ORDER — METOCLOPRAMIDE HCL 5 MG/ML IJ SOLN
5.0000 mg | Freq: Once | INTRAMUSCULAR | Status: AC
  Administered 2017-03-21: 5 mg via INTRAVENOUS

## 2017-03-21 MED ORDER — SUGAMMADEX SODIUM 200 MG/2ML IV SOLN
INTRAVENOUS | Status: DC | PRN
  Administered 2017-03-21: 200 mg via INTRAVENOUS

## 2017-03-21 MED ORDER — ONDANSETRON HCL 4 MG/2ML IJ SOLN
INTRAMUSCULAR | Status: DC | PRN
  Administered 2017-03-21: 4 mg via INTRAVENOUS

## 2017-03-21 MED ORDER — GABAPENTIN 300 MG PO CAPS
300.0000 mg | ORAL_CAPSULE | ORAL | Status: AC
  Administered 2017-03-21: 300 mg via ORAL
  Filled 2017-03-21: qty 1

## 2017-03-21 MED ORDER — LACTATED RINGERS IR SOLN
Status: DC | PRN
  Administered 2017-03-21: 1000 mL

## 2017-03-21 MED ORDER — FENTANYL CITRATE (PF) 100 MCG/2ML IJ SOLN
INTRAMUSCULAR | Status: AC
  Filled 2017-03-21: qty 4

## 2017-03-21 MED ORDER — SODIUM CHLORIDE 0.9 % IJ SOLN
INTRAMUSCULAR | Status: AC
  Filled 2017-03-21: qty 50

## 2017-03-21 MED ORDER — METOCLOPRAMIDE HCL 5 MG/ML IJ SOLN
INTRAMUSCULAR | Status: AC
  Filled 2017-03-21: qty 2

## 2017-03-21 MED ORDER — BUPIVACAINE LIPOSOME 1.3 % IJ SUSP
20.0000 mL | Freq: Once | INTRAMUSCULAR | Status: AC
  Administered 2017-03-21: 70 mL
  Filled 2017-03-21: qty 20

## 2017-03-21 MED ORDER — SODIUM CHLORIDE 0.9 % IJ SOLN
INTRAMUSCULAR | Status: DC | PRN
  Administered 2017-03-21: 50 mL

## 2017-03-21 MED ORDER — 0.9 % SODIUM CHLORIDE (POUR BTL) OPTIME
TOPICAL | Status: DC | PRN
  Administered 2017-03-21: 1000 mL

## 2017-03-21 MED ORDER — ACETAMINOPHEN 500 MG PO TABS
1000.0000 mg | ORAL_TABLET | ORAL | Status: AC
  Administered 2017-03-21: 1000 mg via ORAL
  Filled 2017-03-21: qty 2

## 2017-03-21 MED ORDER — ONDANSETRON HCL 4 MG/2ML IJ SOLN
INTRAMUSCULAR | Status: AC
  Filled 2017-03-21: qty 4

## 2017-03-21 MED ORDER — PROPOFOL 10 MG/ML IV BOLUS
INTRAVENOUS | Status: AC
  Filled 2017-03-21: qty 20

## 2017-03-21 MED ORDER — LACTATED RINGERS IV SOLN
INTRAVENOUS | Status: DC
  Administered 2017-03-21 (×3): via INTRAVENOUS

## 2017-03-21 MED ORDER — SCOPOLAMINE 1 MG/3DAYS TD PT72
1.0000 | MEDICATED_PATCH | TRANSDERMAL | Status: DC
  Administered 2017-03-21: 1.5 mg via TRANSDERMAL
  Filled 2017-03-21: qty 1

## 2017-03-21 MED ORDER — FENTANYL CITRATE (PF) 250 MCG/5ML IJ SOLN
INTRAMUSCULAR | Status: AC
  Filled 2017-03-21: qty 5

## 2017-03-21 MED ORDER — ROCURONIUM BROMIDE 10 MG/ML (PF) SYRINGE
PREFILLED_SYRINGE | INTRAVENOUS | Status: AC
  Filled 2017-03-21: qty 10

## 2017-03-21 MED ORDER — LIDOCAINE 2% (20 MG/ML) 5 ML SYRINGE
INTRAMUSCULAR | Status: DC | PRN
  Administered 2017-03-21: 1 mg/kg/h via INTRAVENOUS

## 2017-03-21 SURGICAL SUPPLY — 47 items
ADH SKN CLS APL DERMABOND .7 (GAUZE/BANDAGES/DRESSINGS)
APL SKNCLS STERI-STRIP NONHPOA (GAUZE/BANDAGES/DRESSINGS)
BENZOIN TINCTURE PRP APPL 2/3 (GAUZE/BANDAGES/DRESSINGS) IMPLANT
BLADE HEX COATED 2.75 (ELECTRODE) ×2 IMPLANT
BLADE SURG 15 STRL LF DISP TIS (BLADE) ×1 IMPLANT
BLADE SURG 15 STRL SS (BLADE) ×2
BLADE SURG SZ11 CARB STEEL (BLADE) ×2 IMPLANT
CHLORAPREP W/TINT 26ML (MISCELLANEOUS) ×2 IMPLANT
DECANTER SPIKE VIAL GLASS SM (MISCELLANEOUS) ×2 IMPLANT
DERMABOND ADVANCED (GAUZE/BANDAGES/DRESSINGS)
DERMABOND ADVANCED .7 DNX12 (GAUZE/BANDAGES/DRESSINGS) IMPLANT
DEVICE SUTURE ENDOST 10MM (ENDOMECHANICALS) IMPLANT
DISSECTOR BLUNT TIP ENDO 5MM (MISCELLANEOUS) IMPLANT
DRAPE UTILITY XL STRL (DRAPES) ×6 IMPLANT
ELECT PENCIL ROCKER SW 15FT (MISCELLANEOUS) ×2 IMPLANT
ELECT REM PT RETURN 15FT ADLT (MISCELLANEOUS) ×2 IMPLANT
GLOVE BIO SURGEON STRL SZ7.5 (GLOVE) ×2 IMPLANT
GLOVE BIOGEL M STRL SZ7.5 (GLOVE) IMPLANT
GLOVE INDICATOR 8.0 STRL GRN (GLOVE) ×2 IMPLANT
GOWN STRL REUS W/TWL LRG LVL3 (GOWN DISPOSABLE) ×2 IMPLANT
GOWN STRL REUS W/TWL XL LVL3 (GOWN DISPOSABLE) ×5 IMPLANT
GRASPER SUT TROCAR 14GX15 (MISCELLANEOUS) ×1 IMPLANT
HOVERMATT SINGLE USE (MISCELLANEOUS) ×2 IMPLANT
KIT BASIN OR (CUSTOM PROCEDURE TRAY) ×2 IMPLANT
NDL SPNL 22GX3.5 QUINCKE BK (NEEDLE) ×1 IMPLANT
NEEDLE SPNL 22GX3.5 QUINCKE BK (NEEDLE) ×2 IMPLANT
PACK UNIVERSAL I (CUSTOM PROCEDURE TRAY) ×2 IMPLANT
SCISSORS LAP 5X45 EPIX DISP (ENDOMECHANICALS) ×2 IMPLANT
SET IRRIG TUBING LAPAROSCOPIC (IRRIGATION / IRRIGATOR) IMPLANT
SHEARS HARMONIC ACE PLUS 36CM (ENDOMECHANICALS) IMPLANT
SLEEVE XCEL OPT CAN 5 100 (ENDOMECHANICALS) ×1 IMPLANT
SOLUTION ANTI FOG 6CC (MISCELLANEOUS) ×2 IMPLANT
SPONGE LAP 18X18 X RAY DECT (DISPOSABLE) ×2 IMPLANT
STAPLER VISISTAT 35W (STAPLE) IMPLANT
STRIP CLOSURE SKIN 1/2X4 (GAUZE/BANDAGES/DRESSINGS) IMPLANT
SUT MNCRL AB 4-0 PS2 18 (SUTURE) ×2 IMPLANT
SUT SILK 0 (SUTURE) ×2
SUT SILK 0 30XBRD TIE 6 (SUTURE) ×1 IMPLANT
SUT VIC AB 2-0 SH 27 (SUTURE) ×2
SUT VIC AB 2-0 SH 27X BRD (SUTURE) ×1 IMPLANT
SUT VICRYL 0 TIES 12 18 (SUTURE) ×1 IMPLANT
SYR 20CC LL (SYRINGE) ×2 IMPLANT
SYR CONTROL 10ML LL (SYRINGE) ×2 IMPLANT
TOWEL OR 17X26 10 PK STRL BLUE (TOWEL DISPOSABLE) ×2 IMPLANT
TROCAR BLADELESS OPT 5 100 (ENDOMECHANICALS) ×6 IMPLANT
TUBE CALIBRATION LAPBAND (TUBING) ×1 IMPLANT
TUBING INSUF HEATED (TUBING) ×2 IMPLANT

## 2017-03-21 NOTE — H&P (Signed)
Angela Abbott Documented: 02/21/2017 9:51 AM Location: [facility] Patient #: [id number] DOB: [birth year] Divorced / Language: Angela Abbott / Race: White Female   History of Present Illness Angela Hiss M.  MD; 03/02/2017 2:43 PM) The patient is a 57 year old female is here for LapBand followup. Angela Abbott is a 57 y.o.female who received an AP-Standard lap-band in February 2010 by Dr. Zettie Pho. Her preoperative weight was 282 pounds. She was last seen in the office in oct 2018. At that time she reported persistent nighttime cough. She was also regurgitating several times a week on occasion. Sometimes it is due to the fact that she is eating too rapidly sometimes it is not. She stops eating at 6 PM but still will have some reflux issues. Her band has been empty - no fluid. At her last appointment she was very interested in salvaging her band so therefore we referred her to pulmonary to see if there was any intrinsic pulmonary issue as an explanation for her chronic cough that she is a chronic smoker in the past. My suspicion was low. She ended up seeing Dr. Melvyn Novas. She elevated the head of the bed, limited sodas and tried Carafate all without any improvement. She has no heartburn. She had recurrent pneumonia in January 2019 and again in early February of this year but no antibiotic treatment. It takes her while for to seem like food is going past. She will bring up food at around 2 AM if she eats solids at 6 PM. Otherwise she denies any medical changes since I last saw her in October   OCT 2018: She had a follow-up upper GI in August which showed normal positioning of her band. Her esophagitis was resolved and there was improvement in her gastritis. She states that overall she is doing better but still having some issues. She is still having some nighttime cough. She is waking up with some coughing and wheezing. She is coughing up phlegm. She is sleeping inclined which is chronic for  her. She stops eating at 7 PM. She is taking reflux medication. Food is no longer getting stuck. Liquids are also going down well. She reports good eating techniques.  She did have some fluid removed from her left knee as well as a cortisone injection otherwise no medical changes.  Review of systems-a 12 point review systems was performed and all systems are negative except for what is mentioned in HPI   Problem List/Past Medical Angela Hiss M. [name], MD; 03/02/2017 2:55 PM) REGURGITATION (R11.10)  FITTING AND ADJUSTMENT OF GASTRIC LAP BAND (Z46.51)  OBESITY (BMI 30-39.9) (E66.9)  HISTORY OF ADJUSTABLE GASTRIC BANDING (Z98.84)  AP-S; 2010; Dr Zettie Pho, preop weight 282 CHRONIC COUGH (R05)   Past Surgical History Angela Hiss M. [name], MD; 03/02/2017 2:55 PM) Cesarean Section - Multiple  Foot Surgery  Right. Hysterectomy (not due to cancer) - Partial  Knee Surgery  Left. Lap Band  Shoulder Surgery  Bilateral.  Diagnostic Studies History Angela Hiss M. [name], MD; 03/02/2017 2:55 PM) Colonoscopy  5-10 years ago Mammogram  within last year Pap Smear  1-5 years ago  Allergies (Angela Abbott, [place]; 02/21/2017 9:53 AM) BEE VENOM  Relafen *ANALGESICS - ANTI-INFLAMMATORY*  Allergies Reconciled   Medication History (Angela Abbott, Abbott; 02/21/2017 9:54 AM) Omeprazole (40MG  Capsule DR, Oral) Active. NexIUM I.V. (40MG  For Solution, Intravenous daily) Active. Estrace (1MG  Tablet, Oral daily) Active. Synthroid (137MCG Tablet, Oral daily) Active. Medications Reconciled  Social History Angela Hiss M. [name], MD; 03/02/2017 2:55 PM)  Alcohol use  Occasional alcohol use. Caffeine use  Carbonated beverages, Coffee. Illicit drug use  Remotely quit drug use. Tobacco use  Current every day smoker.  Family History Angela Hiss M. [name], MD; 03/02/2017 2:55 PM) Alcohol Abuse  Son. Colon Polyps  Father. Hypertension  Father. Prostate Cancer  Father. Cervical Cancer  Sister. Depression   Brother. Diabetes Mellitus  Father. Heart Disease  Mother.  Pregnancy / Birth History Angela Hiss M. [name], MD; 03/02/2017 2:55 PM) Age at menarche  3 years, 26 years. Age of menopause  38-60 46-49 Contraceptive History  Oral contraceptives. Gravida  3 Maternal age  10-20 21-25 Para  2 Regular periods   Other Problems Angela Hiss M. [name], MD; 03/02/2017 2:55 PM) Hemorrhoids  High blood pressure  Thyroid Disease  GASTROESOPHAGEAL REFLUX DISEASE, ESOPHAGITIS PRESENCE NOT SPECIFIED (K21.9)   Vitals (Angela Abbott; 02/21/2017 9:52 AM) 02/21/2017 9:51 AM Weight: 207.6 lb Height: 65.5in Body Surface Area: 2.02 m Body Mass Index: 34.02 kg/m  Temp.: 98.29F  Pulse: 74 (Regular)  BP: 124/84 (Sitting, Left Arm, Standard)       Physical Exam Angela Hiss M.  MD; 03/02/2017 2:43 PM) General Mental Status-Alert. General Appearance-Consistent with stated age. Hydration-Well hydrated. Voice-Normal.  Head and Neck Head-normocephalic, atraumatic with no lesions or palpable masses. Trachea-midline. Thyroid Gland Characteristics - normal size and consistency.  Eye Eyeball - Bilateral-Normal. Sclera/Conjunctiva - Bilateral-No scleral icterus.  ENMT Note: ears - normal external ears mouth - lips intact   Chest and Lung Exam Chest and lung exam reveals -quiet, even and easy respiratory effort with no use of accessory muscles and on auscultation, normal breath sounds, no adventitious sounds and normal vocal resonance. Inspection Chest Wall - Normal. Back - normal.  Breast - Did not examine.  Cardiovascular Cardiovascular examination reveals -normal heart sounds, regular rate and rhythm with no murmurs and normal pedal pulses bilaterally.  Abdomen Inspection Inspection of the abdomen reveals - No Hernias. Skin - Scar - Note: well healed trocar scars. Palpation/Percussion Palpation and Percussion of the abdomen reveal - Soft, Non  Tender, No Rebound tenderness, No Rigidity (guarding) and No hepatosplenomegaly. Auscultation Auscultation of the abdomen reveals - Bowel sounds normal.  Peripheral Vascular Upper Extremity Palpation - Pulses bilaterally normal.  Neurologic Neurologic evaluation reveals -alert and oriented x 3 with no impairment of recent or remote memory. Mental Status-Normal.  Neuropsychiatric The patient's mood and affect are described as -normal. Judgment and Insight-insight is appropriate concerning matters relevant to self.  Musculoskeletal Normal Exam - Left-Upper Extremity Strength Normal and Lower Extremity Strength Normal. Normal Exam - Right-Upper Extremity Strength Normal and Lower Extremity Strength Normal.  Lymphatic Head & Neck  General Head & Neck Lymphatics: Bilateral - Description - Normal. Axillary - Did not examine. Femoral & Inguinal - Did not examine.    Assessment & Plan Angela Hiss M.  MD; 03/02/2017 2:55 PM) REGURGITATION (R11.10) Current Plans Pt Education - [employer] Free Text Education/Instructions: discussed with patient and provided information. HISTORY OF ADJUSTABLE GASTRIC BANDING (Z66.06) Story: AP-S; 2010; Dr Zettie Pho, preop weight 282 Impression: As I suspected I believe her frequent recurrent cough, recurrent pneumonia is due to her adjustable gastric band. Intrinsic pulmonary issue does not appear to be the source. Therefore the best option I have at this point is for [place] to remove the adjustable gastric band to prevent ongoing sequela and recurrent pneumonia. I did access her band today and only got about 0.2 cc out. I still recommend band removal. We discussed what that would involve  such is laparoscopic approach, unbuckling the band, taking down any scar tissue and removing the port. We discussed the typical hospitalization which may be same-day or overnight. We discussed the typical recovery period we also discussed risk such as bleeding, infection, injury  to surrounding structures, blood clot formation, perioperative cardiac and pulmonary events. We discussed that it still may take a while for her esophagus and stomach to normalize after band removal. She wants to proceed with surgery OBESITY (BMI 30-39.9) (E66.9) Impression: I congratulated her on her 4.6 pound weight loss since her last visit. We discussed the importance of regular physical activity as well as good food choices. We discussed strategies for physical activity given her knee issues. CHRONIC COUGH (R05)  [name]. [name], MD, FACS General, Bariatric, & Minimally Invasive Surgery [facility], [place]

## 2017-03-21 NOTE — Brief Op Note (Signed)
03/21/2017  11:31 AM  PATIENT:  Angela Abbott  57 y.o. female  PRE-OPERATIVE DIAGNOSIS:  Dysphagia, frequent pneumonia; H/O adjustable gastric band placement 2010  POST-OPERATIVE DIAGNOSIS:  Dysphagia, frequent pneumonia;  H/O adjustable gastric band placement 2010  PROCEDURE:  Procedure(s): LAPAROSCOPIC REMOVAL OF GASTRIC BAND (N/A) LAPAROSCOPIC BILATERAL TAPP BLOCK  SURGEON:  Surgeon(s) and Role:    [name], MD - Primary    * Johnathan Hausen, MD - Assisting  PHYSICIAN ASSISTANT:   ASSISTANTS: Johnathan Hausen MD FACS   ANESTHESIA:   general  EBL:  25 mL   BLOOD ADMINISTERED:none  DRAINS: none   LOCAL MEDICATIONS USED:  OTHER exparel with marcaine  SPECIMEN:  Source of Specimen:  lap adjustable gastric band in pieces  DISPOSITION OF SPECIMEN:  discarded  COUNTS:  YES  TOURNIQUET:  * No tourniquets in log *  DICTATION: .Other Dictation: Dictation Number [id number]  PLAN OF CARE: Discharge to home after PACU  PATIENT DISPOSITION:  PACU - hemodynamically stable.   Delay start of Pharmacological VTE agent (>24hrs) due to surgical blood loss or risk of bleeding: not applicable

## 2017-03-21 NOTE — Discharge Instructions (Signed)
[facility] LAP[facility]OS[facility]: POST OP INSTRUCTIONS Always review your discharge instruction sheet given to you by the facility where your surgery was performed. IF YOU HAVE DISABILITY OR FAMILY LEAVE FORMS, YOU MUST BRING THEM TO THE OFFICE FOR PROCESSING.   DO NOT GIVE THEM TO YOUR DOCTOR.  1. First,  take acetaminophen (Tylenol) and ibuprofen (Advil) as needed. Follow package directions. 2.  If still have pain taking both tylenol and ibuprofen, then you may take prescription pain medication (oxycodone). Follow instructions on prescription.  3. If you need a refill on your pain medication, please contact your pharmacy.  They will contact our office to request authorization. Prescriptions will not be filled after 5pm or on week-ends. 4. You should follow a liquid diet the first few days after arrival home. You can then gradually advance your diet back to solid foods.  Be sure to include lots of fluids daily. 5. Most patients will experience some swelling and bruising in the area of the incisions.  Ice packs will help.  Swelling and bruising can take several days to resolve.  6. It is common to experience some constipation if taking pain medication after surgery.  Increasing fluid intake and taking a stool softener (such as Colace) will usually help or prevent this problem from occurring.  A mild laxative (Milk of Magnesia or Miralax) should be taken according to package instructions if there are no bowel movements after 48 hours. 7. Unless discharge instructions indicate otherwise, you may remove your bandages 48 hours after surgery, and you may shower at that time.  You  have steri-strips (small skin tapes) in place directly over the incision.  These strips should be left on the skin for 7-10 days.   8. ACTIVITIES:  You may resume regular (light) daily activities beginning the next day--such as daily self-care, walking, climbing stairs--gradually increasing activities as  tolerated.  You may have sexual intercourse when it is comfortable.  Refrain from any heavy lifting or straining until approved by your doctor. a. You may drive when you are no longer taking prescription pain medication, you can comfortably wear a seatbelt, and you can safely maneuver your car and apply brakes. 9. You should see your doctor in the office for a follow-up appointment approximately 2-3 weeks after your surgery.  Make sure that you call for this appointment within a day or two after you arrive home to insure a convenient appointment time. 10. OTHER INSTRUCTIONS:  WHEN TO CALL YOUR DOCTOR: 1. Fever over 101.0 2. Inability to urinate 3. Continued bleeding from incision. 4. Increased pain, redness, or drainage from the incision. 5. Increasing abdominal pain  The clinic staff is available to answer your questions during regular business hours.  Please dont hesitate to call and ask to speak to one of the nurses for clinical concerns.  If you have a medical emergency, go to the nearest emergency room or call 911.  A surgeon from [facility] is always on call at the hospital. [address], [place], [place], [place]  [address] ? [address], [place], [place]   [address] [phone] ? [phone] ? FAX [phone] Web site: [email or web address]

## 2017-03-21 NOTE — Anesthesia Preprocedure Evaluation (Addendum)
Anesthesia Evaluation  Patient identified by MRN, date of birth, ID band Patient awake    Reviewed: Allergy & Precautions, NPO status , Patient's Chart, lab work & pertinent test results  Airway Mallampati: II  TM Distance: >3 FB     Dental  (+) Dental Advisory Given, Missing, Poor Dentition, Chipped, Loose   Pulmonary Current Smoker,    breath sounds clear to auscultation       Cardiovascular negative cardio ROS   Rhythm:Regular Rate:Normal     Neuro/Psych    GI/Hepatic Neg liver ROS, GERD  ,  Endo/Other  Hypothyroidism   Renal/GU negative Renal ROS     Musculoskeletal  (+) Arthritis ,   Abdominal   Peds  Hematology   Anesthesia Other Findings   Reproductive/Obstetrics                            Anesthesia Physical Anesthesia Plan  ASA: II  Anesthesia Plan: General   Post-op Pain Management:    Induction: Intravenous  PONV Risk Score and Plan: 2 and Treatment may vary due to age or medical condition, Ondansetron, Dexamethasone and Midazolam  Airway Management Planned: Oral ETT  Additional Equipment:   Intra-op Plan:   Post-operative Plan: Extubation in OR  Informed Consent: I have reviewed the patients History and Physical, chart, labs and discussed the procedure including the risks, benefits and alternatives for the proposed anesthesia with the patient or authorized representative who has indicated his/her understanding and acceptance.   Dental advisory given  Plan Discussed with:   Anesthesia Plan Comments:         Anesthesia Quick Evaluation

## 2017-03-21 NOTE — Anesthesia Postprocedure Evaluation (Signed)
Anesthesia Post Note  Patient: Angela Abbott  Procedure(s) Performed: LAPAROSCOPIC REPAIR AND REMOVAL OF GASTRIC BAND (N/A )     Patient location during evaluation: PACU Anesthesia Type: General Level of consciousness: awake Pain management: pain level controlled Vital Signs Assessment: post-procedure vital signs reviewed and stable Respiratory status: spontaneous breathing Cardiovascular status: stable Anesthetic complications: no    Last Vitals:  Vitals:   03/21/17 1230 03/21/17 1239  BP: (!) 141/80 (!) 167/87  Pulse: 77 65  Resp: (!) 26 18  Temp: 36.7 C (!) 36.4 C  SpO2: 93% 94%    Last Pain:  Vitals:   03/21/17 1230  TempSrc:   PainSc: 0-No pain                  

## 2017-03-21 NOTE — Anesthesia Procedure Notes (Addendum)
Procedure Name: Intubation Date/Time: 03/21/2017 9:51 AM Performed by: Cynda Familia, CRNA Pre-anesthesia Checklist: Patient identified, Emergency Drugs available, Suction available and Patient being monitored Patient Re-evaluated:Patient Re-evaluated prior to induction Oxygen Delivery Method: Circle System Utilized Preoxygenation: Pre-oxygenation with 100% oxygen Induction Type: IV induction and Rapid sequence Ventilation: Mask ventilation without difficulty Laryngoscope Size: Miller and 2 Grade View: Grade I Tube type: Oral Number of attempts: 1 Airway Equipment and Method: Stylet and Oral airway Placement Confirmation: ETT inserted through vocal cords under direct vision,  positive ETCO2 and breath sounds checked- equal and bilateral Secured at: 22 cm Tube secured with: Tape Dental Injury: Teeth and Oropharynx as per pre-operative assessment  Comments: Smooth IV induction Green--- intubation AM CRNA atraumatic--- teeth and mouth as preop-- many missing teeth upper teeth loose prior to laryngoscopy--- bilat BS Green

## 2017-03-21 NOTE — Interval H&P Note (Signed)
History and Physical Interval Note:  03/21/2017 9:13 AM  Angela Abbott  has presented today for surgery, with the diagnosis of Z98.84, R11.10, K21.9, R05, J18.9, I10  The various methods of treatment have been discussed with the patient and family. After consideration of risks, benefits and other options for treatment, the patient has consented to  Procedure(s): [place] (N/A) as a surgical intervention .  The patient's history has been reviewed, patient examined, no change in status, stable for surgery.  I have reviewed the patient's chart and labs.  Questions were answered to the patient's satisfaction.    [name]. [name], MD, FACS General, Bariatric, & Minimally Invasive Surgery [facility]   [name]

## 2017-03-21 NOTE — Anesthesia Procedure Notes (Signed)
Date/Time: 03/21/2017 11:29 AM Performed by: Cynda Familia, CRNA Oxygen Delivery Method: Simple face mask Placement Confirmation: positive ETCO2 and breath sounds checked- equal and bilateral Dental Injury: Teeth and Oropharynx as per pre-operative assessment

## 2017-03-21 NOTE — Transfer of Care (Signed)
Immediate Anesthesia Transfer of Care Note  Patient: Angela Abbott  Procedure(s) Performed: LAPAROSCOPIC REPAIR AND REMOVAL OF GASTRIC BAND (N/A )  Patient Location: PACU  Anesthesia Type:General  Level of Consciousness: awake and alert   Airway & Oxygen Therapy: Patient Spontanous Breathing and Patient connected to face mask oxygen  Post-op Assessment: Report given to RN and Post -op Vital signs reviewed and stable  Post vital signs: Reviewed and stable  Last Vitals:  Vitals:   03/21/17 0753  BP: (!) 153/86  Pulse: 70  Resp: 16  Temp: 36.9 C  SpO2: 98%    Last Pain:  Vitals:   03/21/17 0753  TempSrc: Oral         Complications: No apparent anesthesia complications

## 2017-03-22 ENCOUNTER — Encounter (HOSPITAL_COMMUNITY): Payer: Self-pay | Admitting: General Surgery

## 2017-03-22 NOTE — Op Note (Signed)
NAME:  Angela Abbott, Angela Abbott                   ACCOUNT NO.:  MEDICAL RECORD NO.:  [id number]  LOCATION:                                 FACILITY:  PHYSICIAN:  [name]. [name], MD, FACSDATE OF BIRTH:  Jul 06, 1960  DATE OF PROCEDURE:  03/21/2017 DATE OF DISCHARGE:                              OPERATIVE REPORT   PREOPERATIVE DIAGNOSES: 1. Dysphagia. 2. Frequent pneumonia. 3. H/o laparoscopic adjustable gastric band placement  POSTOPERATIVE DIAGNOSES: 1. Dysphagia. 2. Frequent pneumonia. 3. H/o laparoscopic adjustable gastric band placement  PROCEDURES: 1. Laparoscopic removal of adjustable gastric band. 2. Laparoscopic bilateral Tap block.  SURGEON:  [name]. [name], MD, FACS.  ASSISTANT SURGEON:  Isabel Caprice. Hassell Done, MD.  ANESTHESIA:  General.  EBL:  25 cc.  SPECIMEN:  Adjustable gastric band in pieces, which was discarded.  INDICATIONS FOR PROCEDURE:  The patient is a pleasant 57 year old female who underwent placement of an adjustable gastric band in winter of 2010. She did quite well for many years; however, over the past 8 months or so, she has been having issues with worsening reflux, dysphagia with nighttime emesis as well as frequent episodes and admissions for pneumonia.  The fluid was removed from her band and some of her symptoms improved.  However, she still had ongoing issues with tolerating solid foods late in the evening with regurgitation as well as ongoing episodes of pneumonia, thought to be due to aspiration.  Pulmonary evaluator did not feel that her frequent pneumonias were due to intrinsic lung disease.  There was no fluid left in her band, so therefore, I recommended removal.  Please see chart for discussion regarding risks and benefits, which we discussed extensively on several occasions.  DESCRIPTION OF PROCEDURE:  After obtaining informed consent, she was taken to OR #2 at [facility], placed supine on the operating room table.  General  endotracheal anesthesia was established. Sequential compression devices were placed.  Her abdomen was prepped and draped in the usual standard surgical fashion with ChloraPrep.  A surgical time-out was performed.  I elected to gain access to her abdomen using Hasson technique.  A small supraumbilical incision was made.  Fascia was grasped and lifted anteriorly.  The fascia was incised and a pursestring suture was placed around the fascial edges with 0 Vicryl.  A 12-mm Hasson trocar was placed and pneumoperitoneum was smoothly established up to a patient pressure of 15 mmHg without any change in vital signs.  The abdominal cavity was surveilled.  There was no evidence of injuries to surrounding structures.  The laparoscopic band tubing was identified going up toward the proximal stomach.  The patient was placed in reverse Trendelenburg.  I ended up placing a subxiphoid incision to place a Nathanson liver retractor to lift up the left lobe of the liver.  There were few omental and perigastric adhesions to the left lobe, but I was able to visualize the lap band.  I then went ahead and placed a bilateral transversus abdominis nerve block with a combination of Exparel and Marcaine.  I placed a 5-mm trocar in the lateral right abdominal wall and another 5-mm just above the port in the  subcutaneous tissue.  My partner then placed a 5-mm trocar in the left upper quadrant all under direct visualization.  The stomach above the pouch was very dilated.  There was no evidence of band slippage.  It was still in the correct orientation; however, the proximal stomach above the band was very dilated.  There were some omental attachments overlying the band buckle, this was taken down with hook electrocautery. I identified the capsule that typically formed around the band.  This was incised with hook electrocautery and I was able to completely expose the buckle of the band.  At this point, I unbuckled the  band and cut it in half.  I then went about taking down the fibrinous capsule staying on top of the band itself using hook electrocautery as well as Endo Shears without cautery.  One of the imbrication sutures was identified and taken down.  I then continued my dissection up toward the left upper quadrant staying on top of the capsule.  This was done layer by layer. We confirmed that we were just taken down the capsule and not violating the gastric wall at all.  At this point, I was able to remove the band from around the stomach.  I taken down all the imbrication sutures. There was no violation of the gastric tissue.  There was a firm cicatrix around the stomach where the band had been wrapped.  I was able to lift up the underlying cicatrix and dissect underneath it and I was able to release it by incising it with Endo Shears without electrocautery.  This freed up the cicatrix and allowed the stomach in this location to relax. I then, using blunt dissection, was able to lift it up and ellipse more of it off the overlying gastric wall.  At this point, Anesthesia was able to pass a calibration tube down the oropharynx into the stomach past where the band had been placed.  I then inflated the balloon with 15 cc of air and it slid past the cicatrix.  We then desufflated, readvanced and reinflated the calibration tube to 15 directly where the band had been placed.  It stretched easily.  At this point, I did not feel I needed to do an endoscopy.  The calibration tube was deflated and removed from the abdomen.  I inspected the gastric wall where we had done our dissection and there was no evidence of violation of any of the gastric wall.  There was no bleeding.  At this point, the camera was placed in the left lateral trocar and I started to bring out the band in pieces.  All of the band and proximal tubing were removed.  This just left the actual port and the tubing attached it.  The Hasson  trocar was removed and the previously-placed pursestring suture was tied down, thus obliterating the fascial defect.  There was no significant air leak, but I did elect to place two additional interrupted 0 Vicryls at the umbilical fascial closure using a PMI suture passer with laparoscopic assistance.  Exparel was infiltrated in this location.  Pneumoperitoneum was released.  I then removed the 5-mm trocar in the right mid abdomen that had been placed at the site of the port.  I then enlarged this incision with a 15-blade.  Subcutaneous tissue was divided with electrocautery.  I came across the capsule encasing the port.  This was incised with electrocautery.  I was then able to grab the port with a towel clamp and  circumferentially mobilized it using hook electrocautery.  The anchoring sutures were excised as well.  The port and remaining tubing were then easily removed.  There were no pieces of the band left.  Remaining fibrinous capsule was extracted from the subcutaneous tissue.  There was no palpable fascial defect.  At this point, remaining trocars were removed.  I closed the deep dermis in the right mid abdomen incision with interrupted 2-0 Vicryls.  All skin incisions were then closed with a running 4-0 Monocryl in a subcuticular fashion followed by application of benzoin, Steri-Strips and bandages.  All needle, instrument and sponge counts were correct x2.  There were no immediate complications.  The patient tolerated the procedure well.     [name]. [name], MD, FACS     EMW/MEDQ  D:  03/21/2017  T:  03/22/2017  Job:  [id number]

## 2017-04-11 MED FILL — Bupivacaine Liposome Inj 1.3% (13.3 MG/ML): INTRAMUSCULAR | Qty: 20 | Status: AC

## 2017-04-17 DIAGNOSIS — Z1231 Encounter for screening mammogram for malignant neoplasm of breast: Secondary | ICD-10-CM | POA: Diagnosis not present

## 2017-04-17 DIAGNOSIS — Z01419 Encounter for gynecological examination (general) (routine) without abnormal findings: Secondary | ICD-10-CM | POA: Diagnosis not present

## 2017-04-24 ENCOUNTER — Other Ambulatory Visit: Payer: Self-pay | Admitting: Podiatry

## 2017-04-24 ENCOUNTER — Ambulatory Visit: Payer: 59 | Admitting: Podiatry

## 2017-04-24 ENCOUNTER — Ambulatory Visit (INDEPENDENT_AMBULATORY_CARE_PROVIDER_SITE_OTHER): Payer: 59

## 2017-04-24 ENCOUNTER — Encounter: Payer: Self-pay | Admitting: Podiatry

## 2017-04-24 VITALS — Resp 16

## 2017-04-24 DIAGNOSIS — M79672 Pain in left foot: Secondary | ICD-10-CM | POA: Diagnosis not present

## 2017-04-24 DIAGNOSIS — M779 Enthesopathy, unspecified: Secondary | ICD-10-CM

## 2017-04-24 DIAGNOSIS — M778 Other enthesopathies, not elsewhere classified: Secondary | ICD-10-CM

## 2017-04-24 DIAGNOSIS — M7752 Other enthesopathy of left foot: Secondary | ICD-10-CM

## 2017-04-24 MED ORDER — TRIAMCINOLONE ACETONIDE 10 MG/ML IJ SUSP
10.0000 mg | Freq: Once | INTRAMUSCULAR | Status: AC
  Administered 2017-04-24: 10 mg

## 2017-04-27 NOTE — Progress Notes (Signed)
Subjective:   Patient ID: Angela Abbott, female   DOB: 57 y.o.   MRN: [id number]   HPI Patient presents stating she has had problems with her feet for several years has orthotics and has chronic discomfort in this joint of the left foot and it feels like it is burning all the time.  Patient states that she was told she had an inflamed tendon.  Patient smokes half pack per day and likes to be active   Review of Systems  All other systems reviewed and are negative.       Objective:  Physical Exam  Constitutional: She appears well-developed and well-nourished.  Cardiovascular: Intact distal pulses.  Pulmonary/Chest: Effort normal.  Musculoskeletal: Normal range of motion.  Neurological: She is alert.  Skin: Skin is warm.  Nursing note and vitals reviewed.   Neurovascular status intact muscle strength adequate range of motion within normal limits with quite a bit of discomfort in the third metatarsal phalangeal joint left with inflammation fluid around the joint surface that is painful when pressed and making walking in shoe gear difficult.  Patient is noted to have good digital perfusion and is well oriented x3     Assessment:  Probability for acute plantar capsulitis of the MPJ third and possibly second left with also digital deformities     Plan:  H&P education rendered and we will discuss this condition first and see what we can do to get it better.  I went ahead and I focused on the joint and did proximal nerve block and under sterile conditions with sterile technique I aspirated the third MPJ getting a small amount of clear fluid and injected quarter cc dexamethasone Kenalog and applied thick plantar padding to reduce pressure on the joint surface.  Reappoint for [place] to recheck x-rays.  X-rays indicate no signs of fracture with possible elongation third metatarsal left

## 2017-05-08 ENCOUNTER — Encounter: Payer: Self-pay | Admitting: Podiatry

## 2017-05-08 ENCOUNTER — Ambulatory Visit: Payer: 59 | Admitting: Podiatry

## 2017-05-08 DIAGNOSIS — M779 Enthesopathy, unspecified: Secondary | ICD-10-CM

## 2017-05-09 NOTE — Progress Notes (Signed)
Subjective:   Patient ID: Angela Abbott, female   DOB: 57 y.o.   MRN: [id number]   HPI Patient states she is doing much better with the pain that she was experiencing and is walking better with a heel toe gait   ROS      Objective:  Physical Exam  Neurovascular status intact with patient noted to have significant diminishment of discomfort in the third metatarsal phalangeal joint left with mild pain still when pressed dorsally but improved quite dramatically from previous visit     Assessment:  Improved capsulitis third MPJ left with pain still present upon deep palpation     Plan:  We discussed orthotics and we may have to make her a different type but at this time are to try to continue to use which she is doing and I did discuss that she needs to wear a rigid bottom shoes and there is a strong possibility this is going to recur

## 2017-06-05 DIAGNOSIS — K921 Melena: Secondary | ICD-10-CM | POA: Diagnosis not present

## 2017-06-05 DIAGNOSIS — K5289 Other specified noninfective gastroenteritis and colitis: Secondary | ICD-10-CM | POA: Diagnosis not present

## 2017-06-19 DIAGNOSIS — M65331 Trigger finger, right middle finger: Secondary | ICD-10-CM | POA: Diagnosis not present

## 2017-06-19 DIAGNOSIS — M20012 Mallet finger of left finger(s): Secondary | ICD-10-CM | POA: Diagnosis not present

## 2017-06-27 ENCOUNTER — Other Ambulatory Visit (HOSPITAL_COMMUNITY): Payer: Self-pay | Admitting: Internal Medicine

## 2017-06-27 ENCOUNTER — Ambulatory Visit (HOSPITAL_COMMUNITY)
Admission: RE | Admit: 2017-06-27 | Discharge: 2017-06-27 | Disposition: A | Discharge status: Home / Self Care | Payer: 59 | Source: Ambulatory Visit | Attending: Internal Medicine | Admitting: Internal Medicine

## 2017-06-27 DIAGNOSIS — R519 Headache, unspecified: Secondary | ICD-10-CM

## 2017-06-27 DIAGNOSIS — R51 Headache: Secondary | ICD-10-CM | POA: Insufficient documentation

## 2017-06-27 DIAGNOSIS — K219 Gastro-esophageal reflux disease without esophagitis: Secondary | ICD-10-CM | POA: Diagnosis not present

## 2017-07-04 DIAGNOSIS — R7309 Other abnormal glucose: Secondary | ICD-10-CM | POA: Diagnosis not present

## 2017-07-04 DIAGNOSIS — Z1389 Encounter for screening for other disorder: Secondary | ICD-10-CM | POA: Diagnosis not present

## 2017-07-04 DIAGNOSIS — E063 Autoimmune thyroiditis: Secondary | ICD-10-CM | POA: Diagnosis not present

## 2017-07-04 DIAGNOSIS — R42 Dizziness and giddiness: Secondary | ICD-10-CM | POA: Diagnosis not present

## 2017-07-17 DIAGNOSIS — M20012 Mallet finger of left finger(s): Secondary | ICD-10-CM | POA: Diagnosis not present

## 2017-07-17 DIAGNOSIS — M65331 Trigger finger, right middle finger: Secondary | ICD-10-CM | POA: Diagnosis not present

## 2017-08-08 DIAGNOSIS — E890 Postprocedural hypothyroidism: Secondary | ICD-10-CM | POA: Diagnosis not present

## 2017-09-04 ENCOUNTER — Telehealth: Payer: Self-pay | Admitting: Podiatry

## 2017-09-04 ENCOUNTER — Encounter: Payer: Self-pay | Admitting: Podiatry

## 2017-09-04 ENCOUNTER — Ambulatory Visit: Payer: 59 | Admitting: Podiatry

## 2017-09-04 ENCOUNTER — Ambulatory Visit (INDEPENDENT_AMBULATORY_CARE_PROVIDER_SITE_OTHER): Payer: 59

## 2017-09-04 DIAGNOSIS — M7752 Other enthesopathy of left foot: Secondary | ICD-10-CM

## 2017-09-04 DIAGNOSIS — M779 Enthesopathy, unspecified: Secondary | ICD-10-CM | POA: Diagnosis not present

## 2017-09-04 DIAGNOSIS — M7751 Other enthesopathy of right foot: Secondary | ICD-10-CM

## 2017-09-04 MED ORDER — TRIAMCINOLONE ACETONIDE 10 MG/ML IJ SUSP
10.0000 mg | Freq: Once | INTRAMUSCULAR | Status: AC
  Administered 2017-09-04: 10 mg

## 2017-09-04 NOTE — Progress Notes (Signed)
Subjective:   Patient ID: Angela Abbott, female   DOB: 57 y.o.   MRN: [id number]   HPI Patient states she is having a lot of pain again in the joint of her left foot and stated she had a number of months relief but it started to get sore again she does work on cement floors and relates this offloading.   ROS      Objective:  Physical Exam  Neurovascular status is intact with patient found to have exquisite inflammation third MPJ left with fluid buildup around the joint and pain with palpation.  Patient does have prominence of the third metatarsal which causes compression against the cement floors that she walks on     Assessment:  Acute capsulitis third MPJ with plantar flexion of the metatarsal     Plan:  For the acute symptoms I did do a proximal nerve block aspirated the joint getting out a small amount of clear fluid injected quarter cc dexamethasone Kenalog.  I did x-ray to rule out a stress fracture the last several months and I then discussed orthotics with offloading the third metatarsal left and I had her see the ped orthotist who agrees this would be in her best interest were making and specific for this condition and I am making him with a deep heel seat of 12 mm try to pressure on the bottom of the foot.  Patient will be seen back for [place] to recheck again when orthotics are ready or earlier if needed  X-rays indicate that there is no indications that stress fractures occur but the third digit is laterally rotated against the third metatarsal

## 2017-09-04 NOTE — Telephone Encounter (Signed)
Pt called and has spoken to [facility] and they told her they are covered at 100% if custom made and ordered by a doctor.(1 pair per year). If I needed to verify the number is [phone].

## 2017-09-11 ENCOUNTER — Ambulatory Visit: Payer: 59 | Admitting: Podiatry

## 2017-09-18 DIAGNOSIS — M1712 Unilateral primary osteoarthritis, left knee: Secondary | ICD-10-CM | POA: Diagnosis not present

## 2017-09-18 DIAGNOSIS — M7052 Other bursitis of knee, left knee: Secondary | ICD-10-CM | POA: Diagnosis not present

## 2017-10-01 DIAGNOSIS — M25512 Pain in left shoulder: Secondary | ICD-10-CM | POA: Diagnosis not present

## 2017-10-23 ENCOUNTER — Ambulatory Visit: Payer: 59 | Admitting: Orthotics

## 2017-10-23 DIAGNOSIS — M79672 Pain in left foot: Secondary | ICD-10-CM

## 2017-10-23 DIAGNOSIS — M779 Enthesopathy, unspecified: Secondary | ICD-10-CM

## 2017-10-27 ENCOUNTER — Encounter (HOSPITAL_COMMUNITY): Payer: Self-pay | Admitting: Emergency Medicine

## 2017-10-27 ENCOUNTER — Emergency Department (HOSPITAL_COMMUNITY): Payer: 59

## 2017-10-27 ENCOUNTER — Inpatient Hospital Stay (HOSPITAL_COMMUNITY): Payer: 59

## 2017-10-27 ENCOUNTER — Inpatient Hospital Stay (HOSPITAL_COMMUNITY)
Admission: EM | Admit: 2017-10-27 | Discharge: 2017-10-29 | DRG: 066 | Disposition: A | Discharge status: Home / Self Care | Payer: 59 | Attending: Internal Medicine | Admitting: Internal Medicine

## 2017-10-27 DIAGNOSIS — Z90711 Acquired absence of uterus with remaining cervical stump: Secondary | ICD-10-CM

## 2017-10-27 DIAGNOSIS — Z23 Encounter for immunization: Secondary | ICD-10-CM

## 2017-10-27 DIAGNOSIS — E669 Obesity, unspecified: Secondary | ICD-10-CM | POA: Diagnosis present

## 2017-10-27 DIAGNOSIS — R200 Anesthesia of skin: Secondary | ICD-10-CM | POA: Diagnosis not present

## 2017-10-27 DIAGNOSIS — I63231 Cerebral infarction due to unspecified occlusion or stenosis of right carotid arteries: Secondary | ICD-10-CM | POA: Diagnosis not present

## 2017-10-27 DIAGNOSIS — I10 Essential (primary) hypertension: Secondary | ICD-10-CM | POA: Diagnosis present

## 2017-10-27 DIAGNOSIS — M19011 Primary osteoarthritis, right shoulder: Secondary | ICD-10-CM | POA: Diagnosis present

## 2017-10-27 DIAGNOSIS — I5030 Unspecified diastolic (congestive) heart failure: Secondary | ICD-10-CM | POA: Diagnosis not present

## 2017-10-27 DIAGNOSIS — K219 Gastro-esophageal reflux disease without esophagitis: Secondary | ICD-10-CM | POA: Diagnosis not present

## 2017-10-27 DIAGNOSIS — R42 Dizziness and giddiness: Secondary | ICD-10-CM | POA: Diagnosis not present

## 2017-10-27 DIAGNOSIS — Z9884 Bariatric surgery status: Secondary | ICD-10-CM | POA: Diagnosis not present

## 2017-10-27 DIAGNOSIS — Z91030 Bee allergy status: Secondary | ICD-10-CM

## 2017-10-27 DIAGNOSIS — E039 Hypothyroidism, unspecified: Secondary | ICD-10-CM | POA: Diagnosis present

## 2017-10-27 DIAGNOSIS — M160 Bilateral primary osteoarthritis of hip: Secondary | ICD-10-CM | POA: Diagnosis present

## 2017-10-27 DIAGNOSIS — Z6839 Body mass index (BMI) 39.0-39.9, adult: Secondary | ICD-10-CM | POA: Diagnosis not present

## 2017-10-27 DIAGNOSIS — Z823 Family history of stroke: Secondary | ICD-10-CM | POA: Diagnosis not present

## 2017-10-27 DIAGNOSIS — Z79890 Hormone replacement therapy (postmenopausal): Secondary | ICD-10-CM

## 2017-10-27 DIAGNOSIS — Z720 Tobacco use: Secondary | ICD-10-CM | POA: Diagnosis not present

## 2017-10-27 DIAGNOSIS — M170 Bilateral primary osteoarthritis of knee: Secondary | ICD-10-CM | POA: Diagnosis present

## 2017-10-27 DIAGNOSIS — I6521 Occlusion and stenosis of right carotid artery: Secondary | ICD-10-CM | POA: Diagnosis not present

## 2017-10-27 DIAGNOSIS — F17210 Nicotine dependence, cigarettes, uncomplicated: Secondary | ICD-10-CM | POA: Diagnosis present

## 2017-10-27 DIAGNOSIS — N393 Stress incontinence (female) (male): Secondary | ICD-10-CM | POA: Diagnosis present

## 2017-10-27 DIAGNOSIS — Z79899 Other long term (current) drug therapy: Secondary | ICD-10-CM

## 2017-10-27 DIAGNOSIS — R29700 NIHSS score 0: Secondary | ICD-10-CM | POA: Diagnosis present

## 2017-10-27 DIAGNOSIS — I639 Cerebral infarction, unspecified: Secondary | ICD-10-CM | POA: Diagnosis not present

## 2017-10-27 DIAGNOSIS — Z8249 Family history of ischemic heart disease and other diseases of the circulatory system: Secondary | ICD-10-CM

## 2017-10-27 DIAGNOSIS — M19012 Primary osteoarthritis, left shoulder: Secondary | ICD-10-CM | POA: Diagnosis present

## 2017-10-27 DIAGNOSIS — G459 Transient cerebral ischemic attack, unspecified: Secondary | ICD-10-CM | POA: Diagnosis not present

## 2017-10-27 DIAGNOSIS — I6523 Occlusion and stenosis of bilateral carotid arteries: Secondary | ICD-10-CM | POA: Diagnosis not present

## 2017-10-27 DIAGNOSIS — Z8701 Personal history of pneumonia (recurrent): Secondary | ICD-10-CM | POA: Diagnosis not present

## 2017-10-27 DIAGNOSIS — I6782 Cerebral ischemia: Secondary | ICD-10-CM | POA: Diagnosis present

## 2017-10-27 DIAGNOSIS — R29701 NIHSS score 1: Secondary | ICD-10-CM | POA: Diagnosis not present

## 2017-10-27 LAB — DIFFERENTIAL
ABS IMMATURE GRANULOCYTES: 0.03 10*3/uL (ref 0.00–0.07)
BASOS PCT: 1 %
Basophils Absolute: 0.1 10*3/uL (ref 0.0–0.1)
EOS PCT: 1 %
Eosinophils Absolute: 0.1 10*3/uL (ref 0.0–0.5)
IMMATURE GRANULOCYTES: 0 %
LYMPHS ABS: 2.6 10*3/uL (ref 0.7–4.0)
LYMPHS PCT: 30 %
MONOS PCT: 8 %
Monocytes Absolute: 0.7 10*3/uL (ref 0.1–1.0)
Neutro Abs: 5.1 10*3/uL (ref 1.7–7.7)
Neutrophils Relative %: 60 %

## 2017-10-27 LAB — COMPREHENSIVE METABOLIC PANEL
ALK PHOS: 71 U/L (ref 38–126)
ALT: 20 U/L (ref 0–44)
AST: 17 U/L (ref 15–41)
Albumin: 3.5 g/dL (ref 3.5–5.0)
Anion gap: 8 (ref 5–15)
BUN: 12 mg/dL (ref 6–20)
CALCIUM: 9.2 mg/dL (ref 8.9–10.3)
CHLORIDE: 105 mmol/L (ref 98–111)
CO2: 26 mmol/L (ref 22–32)
CREATININE: 0.95 mg/dL (ref 0.44–1.00)
GFR calc non Af Amer: >60 mL/min (ref >60)
GLUCOSE: 92 mg/dL (ref 70–99)
Potassium: 4.1 mmol/L (ref 3.5–5.1)
SODIUM: 139 mmol/L (ref 135–145)
Total Bilirubin: 0.6 mg/dL (ref 0.3–1.2)
Total Protein: 7.2 g/dL (ref 6.5–8.1)

## 2017-10-27 LAB — CBC
HEMATOCRIT: 42.5 % (ref 36.0–46.0)
Hemoglobin: 13.1 g/dL (ref 12.0–15.0)
MCH: 26.6 pg (ref 26.0–34.0)
MCHC: 30.8 g/dL (ref 30.0–36.0)
MCV: 86.2 fL (ref 80.0–100.0)
PLATELETS: 267 10*3/uL (ref 150–400)
RBC: 4.93 MIL/uL (ref 3.87–5.11)
RDW: 15.2 % (ref 11.5–15.5)
WBC: 8.6 10*3/uL (ref 4.0–10.5)
nRBC: 0 % (ref 0.0–0.2)

## 2017-10-27 LAB — I-STAT CHEM 8, ED
BUN: 21 mg/dL — AB (ref 6–20)
Calcium, Ion: 1.11 mmol/L — ABNORMAL LOW (ref 1.15–1.40)
Chloride: 106 mmol/L (ref 98–111)
Creatinine, Ser: 0.9 mg/dL (ref 0.44–1.00)
GLUCOSE: 91 mg/dL (ref 70–99)
HCT: 40 % (ref 36.0–46.0)
Hemoglobin: 13.6 g/dL (ref 12.0–15.0)
POTASSIUM: 4.8 mmol/L (ref 3.5–5.1)
Sodium: 139 mmol/L (ref 135–145)
TCO2: 31 mmol/L (ref 22–32)

## 2017-10-27 LAB — I-STAT BETA HCG BLOOD, ED (MC, WL, AP ONLY)

## 2017-10-27 LAB — PROTIME-INR
INR: 0.98
PROTHROMBIN TIME: 12.9 s (ref 11.4–15.2)

## 2017-10-27 LAB — APTT: aPTT: 28 seconds (ref 24–36)

## 2017-10-27 LAB — I-STAT TROPONIN, ED: Troponin i, poc: 0 ng/mL (ref 0.00–0.08)

## 2017-10-27 MED ORDER — GADOBUTROL 1 MMOL/ML IV SOLN
10.0000 mL | Freq: Once | INTRAVENOUS | Status: AC | PRN
  Administered 2017-10-27: 10 mL via INTRAVENOUS

## 2017-10-27 NOTE — H&P (Signed)
Angela Abbott:[id number] DOB: [birth year] DOA: 10/27/2017     PCP: Sharilyn Sites, MD   Outpatient Specialists: NONE    Patient arrived to ER on 10/27/17 at 1610  Patient coming from: home Lives alone,   Chief Complaint:  Chief Complaint  Patient presents with  . Numbness    HPI: Angela Abbott is a 57 y.o. female with medical history significant of tobacco abuse, hypothyroidism    Presented with   left-sided numbness of her face and arm For 1 week she has been having intermittent tingling of her left hand occasional pain in her arm is been coming and going since she did not pay attention to it that today after taking a nap she woke up and noticed numbness to the left side of her face lasted for 5 minutes and improved she went to work and then developed heaviness and numbness in her left arm no facial droop associated that no speech abnormalities no headache no chest pain or shortness of breath no fevers chills no nausea no vomiting but her symptoms have not improved Patient is currently on on estrogen   While in ER:  The following Work up has been ordered so far:  Orders Placed This Encounter  Procedures  . CT HEAD WO CONTRAST  . MR Brain W and Wo Contrast  . MR MRV HEAD WO CM  . Protime-INR  . APTT  . CBC  . Differential  . Comprehensive metabolic panel  . Diet NPO time specified  . Cardiac monitoring  . Stroke swallow screen  . NIH Stroke Scale  . Modified Stroke Scale (mNIHSS) Document mNIHSS assessment every 2 hours for a total of 12 hours  . Saline Lock IV, Maintain IV access  . If O2 Sat <94% administer O2 at 2 liters/minute via nasal cannula  . Consult to neurology  . Consult to hospitalist  . Pulse oximetry, continuous  . I-stat troponin, ED  . CBG monitoring, ED  . I-Stat Chem 8, ED  . I-Stat beta hCG blood, ED  . ED EKG     Following Medications were ordered in ER: Medications  gadobutrol (GADAVIST) 1 MMOL/ML injection 10 mL (10 mLs  Intravenous Contrast Given 10/27/17 2207)    Significant initial  Findings: Abnormal Labs Reviewed  I-STAT CHEM 8, ED - Abnormal; Notable for the following components:      Result Value   BUN 21 (*)    Calcium, Ion 1.11 (*)    All other components within normal limits   Trop 0.00 INR 0.98  Na 139 K 4.8  Cr   stable,    Lab Results  Component Value Date   CREATININE 0.90 10/27/2017   CREATININE 0.95 10/27/2017   CREATININE 0.68 03/14/2017      WBC  8.6  HG/HCT   stable,       Component Value Date/Time   HGB 13.6 10/27/2017 1659   HCT 40.0 10/27/2017 1659     Troponin (Point of Care Test) Recent Labs    10/27/17 1658  TROPIPOC 0.00    Lactic Acid, Venous    Component Value Date/Time   LATICACIDVEN 0.55 02/14/2017 0735     UA  ordered  CT HEAD showing small right parietal septal hypodensity worrisome for CVA MRI ordered  MRI showing acute stroke right parietal lobe and the right occipitotemporal junction, normal MRV CXR -  NON acute   ECG:  Personally reviewed by me showing: HR : 78 Rhythm:  NSR,   no evidence of ischemic changes QTC 412    ED Triage Vitals  Enc Vitals Group     BP 10/27/17 1616 (!) 176/100     Pulse Rate 10/27/17 1616 79     Resp 10/27/17 1616 16     Temp 10/27/17 1616 98.8 F (37.1 C)     Temp Source 10/27/17 1616 Oral     SpO2 10/27/17 1616 99 %     Weight 10/27/17 2026 248 lb (112.5 kg)     Height 10/27/17 2026 5' 6.25" (1.683 m)     Head Circumference --      Peak Flow --      Pain Score 10/27/17 1612 0     Pain Loc --      Pain Edu? --      Excl. in [place]? --   TMAX(24)@       Latest  Blood pressure 139/90, pulse 62, temperature 98.1 F (36.7 C), temperature source Oral, resp. rate 14, height 5' 6.25" (1.683 m), weight 112.5 kg, SpO2 100 %.  ER Provider Called: Neurology     Dr.ARoor They Recommend medicine admision Will see in ER  Hospitalist was called for admission for CVA   Review of Systems:    Pertinent  positives include: left arm weakness and tingling   Constitutional:  No weight loss, night sweats, Fevers, chills, fatigue, weight loss  HEENT:  No headaches, Difficulty swallowing,Tooth/dental problems,Sore throat,  No sneezing, itching, ear ache, nasal congestion, post nasal drip,  Cardio-vascular:  No chest pain, Orthopnea, PND, anasarca, dizziness, palpitations.no Bilateral lower extremity swelling  GI:  No heartburn, indigestion, abdominal pain, nausea, vomiting, diarrhea, change in bowel habits, loss of appetite, melena, blood in stool, hematemesis Resp:  no shortness of breath at rest. No dyspnea on exertion, No excess mucus, no productive cough, No non-productive cough, No coughing up of blood.No change in color of mucus.No wheezing. Skin:  no rash or lesions. No jaundice GU:  no dysuria, change in color of urine, no urgency or frequency. No straining to urinate.  No flank pain.  Musculoskeletal:  No joint pain or no joint swelling. No decreased range of motion. No back pain.  Psych:  No change in mood or affect. No depression or anxiety. No memory loss.  Neuro no double vision, no gait abnormality, no slurred speech,   All systems reviewed and apart from [place] all are negative  Past Medical History:   Past Medical History:  Diagnosis Date  . Arthritis    shoulders, knees, hips  . Dental crowns present   . GERD (gastroesophageal reflux disease)   . Hypothyroidism   . Left knee pain 07/2014  . Pneumonia    hx of 02/2017   . Stress incontinence   . Wears partial dentures    upper and lower      Past Surgical History:  Procedure Laterality Date  . ABDOMINAL HYSTERECTOMY     partial  . [place]  . ESOPHAGOGASTRODUODENOSCOPY N/A 11/26/2012   Procedure: ESOPHAGOGASTRODUODENOSCOPY (EGD);  Surgeon: Shann Medal, MD;  Location: [facility];  Service: General;  Laterality: N/A;  . GANGLION CYST EXCISION Right 02/2008   foot  . KNEE ARTHROSCOPY  WITH LATERAL MENISECTOMY Left 07/21/2014   Procedure: LEFT KNEE ARTHROSCOPY,  CHONDROPLASTY, WITH LATERAL AND MEDIAL  MENISCECTOMIES;  Surgeon: Kathryne Hitch, MD;  Location: [facility];  Service: Orthopedics;  Laterality: Left;  . KNEE ARTHROSCOPY WITH MEDIAL MENISECTOMY Left  07/21/2014   Procedure: KNEE ARTHROSCOPY WITH MEDIAL MENISECTOMY;  Surgeon: Kathryne Hitch, MD;  Location: [facility];  Service: Orthopedics;  Laterality: Left;  . LAPAROSCOPIC GASTRIC BANDING  02/18/2008  . LAPAROSCOPIC REPAIR AND REMOVAL OF GASTRIC BAND N/A 03/21/2017   Procedure: LAPAROSCOPIC REPAIR AND REMOVAL OF GASTRIC BAND;  Surgeon: [name], MD;  Location: [facility];  Service: General;  Laterality: N/A;  . PARTIAL HYSTERECTOMY  09/07/2002  . SHOULDER ARTHROSCOPY WITH SUBACROMIAL DECOMPRESSION, ROTATOR CUFF REPAIR AND BICEP TENDON REPAIR Right 11/23/2015   Procedure: RIGHT SHOULDER ARTHROSCOPY WITH DEBRIDEMENT, SUBACROMIAL DECOMPRESSION, DISTAL CLAVICLE EXCISION, ROTATOR CUFF REPAIR AND BICEP TENODESIS;  Surgeon: Ninetta Lights, MD;  Location: [facility];  Service: Orthopedics;  Laterality: Right;    Social History:  Ambulatory   Independently     reports that she has been smoking cigarettes. She has a 17.50 pack-year smoking history. She has never used smokeless tobacco. She reports that she drinks alcohol. She reports that she does not use drugs.     Family History:  Family History  Problem Relation Age of Onset  . CVA Other     Allergies: Allergies  Allergen Reactions  . Bee Venom Shortness Of Breath     Prior to Admission medications   Medication Sig Start Date End Date Taking? Authorizing Provider  estradiol (ESTRACE) 1 MG tablet Take 1 mg by mouth daily.    Yes [provider]  levothyroxine (SYNTHROID, LEVOTHROID) 125 MCG tablet Take 125 mcg by mouth daily before breakfast.   Yes [provider]  omeprazole (PRILOSEC) 40 MG capsule  Take 1 capsule (40 mg total) by mouth daily. 02/15/17  Yes Kathie Dike, MD  tolterodine (DETROL LA) 4 MG 24 hr capsule Take 4 mg by mouth daily.   Yes [provider]  oxyCODONE (OXY IR/ROXICODONE) 5 MG immediate release tablet Take 1 tablet (5 mg total) by mouth every 6 (six) hours as needed for breakthrough pain. Patient not taking: Reported on 10/27/2017 03/21/17   [name], MD   Physical Exam: Blood pressure 139/90, pulse 62, temperature 98.1 F (36.7 C), temperature source Oral, resp. rate 14, height 5' 6.25" (1.683 m), weight 112.5 kg, SpO2 100 %. 1. General:  in No Acute distress  well  -appearing 2. Psychological: Alert and    Oriented 3. Head/ENT:    Dry Mucous Membranes                          Head Non traumatic, neck supple                          Poor Dentition 4. SKIN:  decreased Skin turgor,  Skin clean Dry and intact no rash 5. Heart: Regular rate and rhythm no  Murmur, no Rub or gallop 6. Lungs Clear to auscultation bilaterally, no wheezes or crackles   7. Abdomen: Soft,  non-tender, Non distended  obese   8. Lower extremities: no clubbing, cyanosis, or  edema 9. Neurologically  strength 4out of 5 on the left side 10. MSK: Normal range of motion   LABS:     Recent Labs  Lab 10/27/17 1638 10/27/17 1659  WBC 8.6  --   NEUTROABS 5.1  --   HGB 13.1 13.6  HCT 42.5 40.0  MCV 86.2  --   PLT 267  --    Basic Metabolic Panel: Recent Labs  Lab 10/27/17 1638  10/27/17 1659  NA 139 139  K 4.1 4.8  CL 105 106  CO2 26  --   GLUCOSE 92 91  BUN 12 21*  CREATININE 0.95 0.90  CALCIUM 9.2  --       Recent Labs  Lab 10/27/17 1638  AST 17  ALT 20  ALKPHOS 71  BILITOT 0.6  PROT 7.2  ALBUMIN 3.5   No results for input(s): LIPASE, AMYLASE in the last 168 hours. No results for input(s): AMMONIA in the last 168 hours.    HbA1C: No results for input(s): HGBA1C in the last 72 hours. CBG: No results for input(s): GLUCAP in the last 168 hours.     Urine analysis:    Component Value Date/Time   COLORURINE YELLOW 07/07/2013 1432   APPEARANCEUR CLEAR 07/07/2013 1432   LABSPEC <1.005 (L) 07/07/2013 1432   PHURINE 5.0 07/07/2013 1432   GLUCOSEU NEGATIVE 07/07/2013 1432   HGBUR TRACE (A) 07/07/2013 1432   BILIRUBINUR NEGATIVE 07/07/2013 1432   KETONESUR NEGATIVE 07/07/2013 1432   PROTEINUR NEGATIVE 07/07/2013 1432   UROBILINOGEN 0.2 07/07/2013 1432   NITRITE NEGATIVE 07/07/2013 1432   LEUKOCYTESUR NEGATIVE 07/07/2013 1432      Cultures:    Component Value Date/Time   SDES EXPECTORATED SPUTUM 02/15/2017 0950   SDES  02/15/2017 0950    EXPECTORATED SPUTUM Performed at [facility], [address], [place], [place] [address]    [place] 02/15/2017 0950   SPECREQUEST  02/15/2017 0950    NONE Reflexed from [id number] Performed at [facility], [address], [place], [place] [address]    CULT  02/15/2017 0950    Consistent with normal respiratory flora. Performed at [facility], [place] [address] [place], [place] [address]    REPTSTATUS 02/15/2017 FINAL 02/15/2017 0950   REPTSTATUS 02/18/2017 FINAL 02/15/2017 0950     Radiological Exams on Admission: Dg Chest 2 View  Result Date: 10/27/2017 CLINICAL DATA:  CVA EXAM: CHEST - 2 VIEW COMPARISON:  03/14/2017 FINDINGS: The heart size and mediastinal contours are within normal limits. Both lungs are clear. Mild widening of the right [facility] joint with possible surgical changes at the distal right clavicle. IMPRESSION: No active cardiopulmonary disease. Electronically Signed   By: Donavan Foil M.D.   On: 10/27/2017 23:45   Ct Head Wo Contrast  Result Date: 10/27/2017 CLINICAL DATA:  Intermittent LEFT face and LEFT hand numbness for 2 days. EXAM: CT HEAD WITHOUT CONTRAST TECHNIQUE: Contiguous axial images were obtained from the base of the skull through the vertex without intravenous contrast. COMPARISON:  CT HEAD June 27, 2017 FINDINGS: BRAIN: No intraparenchymal hemorrhage,  mass effect nor midline shift. The ventricles and sulci are normal. No acute large vascular territory infarcts. Subcentimeter subtle RIGHT parietal hypodensity may be extra-axial (axial image 25). Basal cisterns are patent. VASCULAR: Trace calcific atherosclerosis carotid siphon. Mildly dense RIGHT cortical vein (likely [place]). SKULL/SOFT TISSUES: No skull fracture. No significant soft tissue swelling. ORBITS/SINUSES: The included ocular globes and orbital contents are normal.Trace paranasal sinus mucosal thickening. Mastoid air cells are well aerated. OTHER: None. IMPRESSION: Small RIGHT parietal RIGHT parietal subtle hypodensity may be extra-axial. Given adjacent dense cortical vein, venous infarct is possible versus small mass. Recommend MRI of the head with and without contrast and contrast enhanced MRV. Electronically Signed   By: Elon Alas M.D.   On: 10/27/2017 19:08   Mr Jeri Cos And Wo Contrast  Result Date: 10/27/2017 CLINICAL DATA:  57 y/o F; numbness in  the left hand and left face intermittent since Saturday. Symptoms worse today. EXAM: MRI HEAD WITHOUT AND WITH CONTRAST MRV HEAD WITHOUT CONTRAST TECHNIQUE: Multiplanar, multiecho pulse sequences of the brain and surrounding structures were obtained without with intravenous contrast. Angiographic images of the intracranial venous structures were obtained using MRV technique without intravenous contrast. COMPARISON:  10/27/2017 CT head CONTRAST:  10 cc Gadavist FINDINGS: MR HEAD FINDINGS Brain: There several scattered foci of reduced diffusion in the right parietal lobe in the right occipitotemporal junction. Some of the lesions have variable diffusion, for example the right occipitotemporal lesion is hypointense on ADC (series 350, image 22) and the right postcentral gyrus lesion is mildly hypointense/near isointense on ADC (series 305, image 47). Additionally, some of the lesions enhance. Findings are consistent with mixed age acute and  subacute infarctions. No focal mass effect, extra-axial collection, hydrocephalus, or herniation. A few punctate foci of susceptibility hypointensity are present in the right parietal lobe, discrete from areas of ischemia, compatible hemosiderin deposition of chronic microhemorrhage. Vascular: Normal flow voids. Skull and upper cervical spine: Normal marrow signal. Sinuses/Orbits: Mild mucosal thickening of the ethmoid and sphenoid sinuses. No abnormal signal of the mastoid air cells. Orbits are unremarkable. Other: None. MRV HEAD FINDINGS Normal flow related signal within the superior sagittal sinus, internal cerebral veins, basal veins of [place], straight sinus, bilateral transverse sinus, bilateral sigmoid sinus, and bilateral upper internal jugular veins. Additionally, there is normal flow related signal within the large cortical veins. No evidence of dural venous sinus thrombosis. IMPRESSION: 1. Several small scattered foci of acute and subacute infarction (mixed age) are present in the right parietal lobe and the right occipitotemporal junction. No acute hemorrhage. No significant mass effect. 2. Normal MRV.  No evidence of dural venous sinus thrombosis. These results were called by telephone at the time of interpretation on 10/27/2017 at 10:45 pm to Dr. Maryan Rued, who verbally acknowledged these results. Electronically Signed   By: Kristine Garbe M.D.   On: 10/27/2017 22:50   Mr Mrv Head Wo Cm  Result Date: 10/27/2017 CLINICAL DATA:  57 y/o F; numbness in the left hand and left face intermittent since Saturday. Symptoms worse today. EXAM: MRI HEAD WITHOUT AND WITH CONTRAST MRV HEAD WITHOUT CONTRAST TECHNIQUE: Multiplanar, multiecho pulse sequences of the brain and surrounding structures were obtained without with intravenous contrast. Angiographic images of the intracranial venous structures were obtained using MRV technique without intravenous contrast. COMPARISON:  10/27/2017 CT head  CONTRAST:  10 cc Gadavist FINDINGS: MR HEAD FINDINGS Brain: There several scattered foci of reduced diffusion in the right parietal lobe in the right occipitotemporal junction. Some of the lesions have variable diffusion, for example the right occipitotemporal lesion is hypointense on ADC (series 350, image 22) and the right postcentral gyrus lesion is mildly hypointense/near isointense on ADC (series 305, image 47). Additionally, some of the lesions enhance. Findings are consistent with mixed age acute and subacute infarctions. No focal mass effect, extra-axial collection, hydrocephalus, or herniation. A few punctate foci of susceptibility hypointensity are present in the right parietal lobe, discrete from areas of ischemia, compatible hemosiderin deposition of chronic microhemorrhage. Vascular: Normal flow voids. Skull and upper cervical spine: Normal marrow signal. Sinuses/Orbits: Mild mucosal thickening of the ethmoid and sphenoid sinuses. No abnormal signal of the mastoid air cells. Orbits are unremarkable. Other: None. MRV HEAD FINDINGS Normal flow related signal within the superior sagittal sinus, internal cerebral veins, basal veins of Rosenthal, straight sinus, bilateral transverse sinus, bilateral sigmoid sinus, and  bilateral upper internal jugular veins. Additionally, there is normal flow related signal within the large cortical veins. No evidence of dural venous sinus thrombosis. IMPRESSION: 1. Several small scattered foci of acute and subacute infarction (mixed age) are present in the right parietal lobe and the right occipitotemporal junction. No acute hemorrhage. No significant mass effect. 2. Normal MRV.  No evidence of dural venous sinus thrombosis. These results were called by telephone at the time of interpretation on 10/27/2017 at 10:45 pm to Dr. Maryan Rued, who verbally acknowledged these results. Electronically Signed   By: Kristine Garbe M.D.   On: 10/27/2017 22:50    Chart has been  reviewed    Assessment/Plan  57 y.o. female with medical history significant of tobacco abuse hypothyroidism Admitted for CVA  Present on Admission: . CVA (cerebral vascular accident) ([place]) -  - will admit based on TIA/CVA protocol,         Monitor on Tele       MRA/MRI  Resulted - showing acute ischemic CVAs       CTA ordered,          Echo to evaluate for possible embolic source,        obtain cardiac enzymes,  ECG,   Lipid panel, TSH.        Order PT/OT evaluation.         nothing by mouth until passes swallow eval         Will make sure patient is on antiplatelet ASA  325 statin        Allow permissive Hypertension keep BP <220/120        Neurology consulted Have seen pt in ER   . Tobacco abuse -  - Spoke about importance of quitting spent 5 minutes discussing options for treatment, prior attempts at quitting, and dangers of smoking  -At this point patient is    interested in quitting  - order nicotine patch   - nursing tobacco cessation protocol  . Hypothyroidism - - Check TSH continue home medications at current dose  . GERD (gastroesophageal reflux disease) - stable chronic, cont home meds   Other plan as per orders.  DVT prophylaxis:  SCD   Code Status:  FULL CODE  as per patient  I had personally discussed CODE STATUS with patient  Family Communication:   Family not at  Bedside    Disposition Plan:    To home once workup is complete and patient is stable                      Would benefit from PT/OT eval prior to DC  Ordered            Consults called: neurology  Admission status:    inpatient     Expect 2 midnight stay secondary to severity of patient's current illness including    Severe radiological  abnormalities including acute CVA   That are currently affecting medical management.  I expect  patient to be hospitalized for 2 midnights requiring inpatient medical care.  Patient is at high risk for adverse outcome (such as loss of life or disability)  if not treated.  Indication for inpatient stay as follows:  Need for extensive work up and PT/OT evaluation due to acute CVA       Level of care     tele   tele indefinitely please discontinue once patient no longer qualifies      10/28/2017, 1:48 AM  [facility]  Pager [phone]   after 2 AM please page floor coverage PA If 7AM-7PM, please contact the day team taking care of the patient  [email or web address]  Password [email or web address]

## 2017-10-27 NOTE — ED Triage Notes (Signed)
Pt developed numbness In her left hand and left face on Saturday. Symptoms have been intermittent since Saturday. Feels that they are worse today. Grips equal, no arm drift, no facial droop, clear speech. Denies blurry vision. BP 176/92, HR 94

## 2017-10-27 NOTE — ED Provider Notes (Addendum)
[facility] EMERGENCY DEPARTMENT Provider Note   CSN: [id number] Arrival date & time: 10/27/17  1610     History   Chief Complaint Chief Complaint  Patient presents with  . Numbness    HPI Angela Abbott is a 57 y.o. female.  Patient is a 57 year old female with a history of tobacco abuse and no other medical problems that she is aware of presenting today with left-sided numbness of her face and arm.  She states for the last 1 week she has had intermittent tingling of her hand with some mild pain in her arm.  However this will come and go and usually only last minutes at a time.  Today she was taking a nap and when she woke up she noticed numbness to the left side of her face which lasted for about 5 minutes and then resolved.  Today while she was at work she was moving some tubs where she normally does when she developed heaviness, numbness, grip difficulty with the left arm and some numbness on the side of her neck.  She had no facial drooping, difficulty walking, speech abnormalities.  She denies any headaches.  She has had no chest pain, shortness of breath, fever, abdominal pain, nausea or vomiting.  She is currently not taking any medications.  She did see orthopedics this past week and she had an injection in her shoulder as they were thinking it may be nerve related.  She states her symptoms have not improved.  The history is provided by the patient.    Past Medical History:  Diagnosis Date  . Arthritis    shoulders, knees, hips  . Dental crowns present   . GERD (gastroesophageal reflux disease)   . Hypothyroidism   . Left knee pain 07/2014  . Pneumonia    hx of 02/2017   . Stress incontinence   . Wears partial dentures    upper and lower    Patient Active Problem List   Diagnosis Date Noted  . Chest pain 02/14/2017  . Multifocal pneumonia 02/14/2017  . Hypothyroidism 02/14/2017  . GERD (gastroesophageal reflux disease) 02/14/2017  . Pneumonia  02/14/2017  . Cigarette smoker 11/13/2016  . Chronic cough 11/12/2016  . Sciatica of left side 05/18/2013  . Arthritis of wrist, left, degenerative 03/30/2013  . History of laparoscopic adjustable gastric banding, 02/17/2008 04/29/2012  . Morbid obesity ([place]) 01/16/2012  . PLANTAR FACIITIS 07/01/2007  . KNEE PAIN 04/29/2007  . TEAR MEDIAL MENISCUS 04/29/2007    Past Surgical History:  Procedure Laterality Date  . ABDOMINAL HYSTERECTOMY     partial  . [place]  . ESOPHAGOGASTRODUODENOSCOPY N/A 11/26/2012   Procedure: ESOPHAGOGASTRODUODENOSCOPY (EGD);  Surgeon: Shann Medal, MD;  Location: [facility];  Service: General;  Laterality: N/A;  . GANGLION CYST EXCISION Right 02/2008   foot  . KNEE ARTHROSCOPY WITH LATERAL MENISECTOMY Left 07/21/2014   Procedure: LEFT KNEE ARTHROSCOPY,  CHONDROPLASTY, WITH LATERAL AND MEDIAL  MENISCECTOMIES;  Surgeon: Kathryne Hitch, MD;  Location: [facility];  Service: Orthopedics;  Laterality: Left;  . KNEE ARTHROSCOPY WITH MEDIAL MENISECTOMY Left 07/21/2014   Procedure: KNEE ARTHROSCOPY WITH MEDIAL MENISECTOMY;  Surgeon: Kathryne Hitch, MD;  Location: [facility];  Service: Orthopedics;  Laterality: Left;  . LAPAROSCOPIC GASTRIC BANDING  02/18/2008  . LAPAROSCOPIC REPAIR AND REMOVAL OF GASTRIC BAND N/A 03/21/2017   Procedure: LAPAROSCOPIC REPAIR AND REMOVAL OF GASTRIC BAND;  Surgeon: [name], MD;  Location: [facility];  Service: General;  Laterality: N/A;  . PARTIAL HYSTERECTOMY  09/07/2002  . SHOULDER ARTHROSCOPY WITH SUBACROMIAL DECOMPRESSION, ROTATOR CUFF REPAIR AND BICEP TENDON REPAIR Right 11/23/2015   Procedure: RIGHT SHOULDER ARTHROSCOPY WITH DEBRIDEMENT, SUBACROMIAL DECOMPRESSION, DISTAL CLAVICLE EXCISION, ROTATOR CUFF REPAIR AND BICEP TENODESIS;  Surgeon: Ninetta Lights, MD;  Location: [facility];  Service: Orthopedics;  Laterality: Right;     OB History   None      Home  Medications    Prior to Admission medications   Medication Sig Start Date End Date Taking? Authorizing Provider  estradiol (ESTRACE) 1 MG tablet Take 1 mg by mouth daily.     [provider]  ibuprofen (ADVIL,MOTRIN) 200 MG tablet Take 800 mg by mouth daily as needed for moderate pain.     [provider]  levothyroxine (SYNTHROID, LEVOTHROID) 125 MCG tablet Take 125 mcg by mouth daily before breakfast.    [provider]  omeprazole (PRILOSEC) 40 MG capsule Take 1 capsule (40 mg total) by mouth daily. 02/15/17   Kathie Dike, MD  oxyCODONE (OXY IR/ROXICODONE) 5 MG immediate release tablet Take 1 tablet (5 mg total) by mouth every 6 (six) hours as needed for breakthrough pain. 03/21/17   [name], MD  tolterodine (DETROL LA) 4 MG 24 hr capsule Take 4 mg by mouth daily.    [provider]    Family History History reviewed. No pertinent family history.  Social History Social History   Tobacco Use  . Smoking status: Current Every Day Smoker    Packs/day: 0.50    Years: 35.00    Pack years: 17.50    Types: Cigarettes  . Smokeless tobacco: Never Used  . Tobacco comment: has discussed with primary MD  Substance Use Topics  . Alcohol use: Yes    Comment: occasionally  . Drug use: No     Allergies   Bee venom   Review of Systems Review of Systems  All other systems reviewed and are negative.    Physical Exam Updated Vital Signs BP (!) 195/120   Pulse 72   Temp 98.1 F (36.7 C) (Oral)   Resp 14   Ht 5' 6.25" (1.683 m)   Wt 112.5 kg   SpO2 100%   BMI 39.73 kg/m   Physical Exam  Constitutional: She is oriented to person, place, and time. She appears well-developed and well-nourished. No distress.  HENT:  Head: Normocephalic and atraumatic.  Mouth/Throat: Oropharynx is clear and moist.  Eyes: Pupils are equal, round, and reactive to light. Conjunctivae and EOM are normal.  Neck: Normal range of motion. Neck supple.  No  carotid bruits.  No neck pain.  Cardiovascular: Normal rate, regular rhythm and intact distal pulses.  No murmur heard. Pulmonary/Chest: Effort normal and breath sounds normal. No respiratory distress. She has no wheezes. She has no rales.  Abdominal: Soft. She exhibits no distension. There is no tenderness. There is no rebound and no guarding.  Musculoskeletal: Normal range of motion. She exhibits no edema or tenderness.  Neurological: She is alert and oriented to person, place, and time. No cranial nerve deficit or sensory deficit. Coordination and gait normal.  No pronator drift noted in any extremity.  5 out of 5 strength in bilateral lower extremities and right upper extremity.  Left upper extremity with mild decreased handgrip compared to the right.  Sensation is intact.  Speech is normal.  Normal finger to nose testing  Skin: Skin is warm  and dry. No rash noted. No erythema.  Psychiatric: She has a normal mood and affect. Her behavior is normal.  Nursing note and vitals reviewed.    ED Treatments / Results  Labs (all labs ordered are listed, but only abnormal results are displayed) Labs Reviewed  I-STAT CHEM 8, ED - Abnormal; Notable for the following components:      Result Value   BUN 21 (*)    Calcium, Ion 1.11 (*)    All other components within normal limits  PROTIME-INR  APTT  CBC  DIFFERENTIAL  COMPREHENSIVE METABOLIC PANEL  I-STAT TROPONIN, ED  CBG MONITORING, ED  I-STAT BETA HCG BLOOD, ED ([facility], [facility], [facility] ONLY)    EKG EKG Interpretation  Date/Time:  Monday October 27 2017 16:27:20 EDT Ventricular Rate:  78 PR Interval:  178 QRS Duration: 86 QT Interval:  362 QTC Calculation: 412 R Axis:   -48 Text Interpretation:  Normal sinus rhythm Left anterior fascicular block No significant change since last tracing Confirmed by Blanchie Dessert [phone]) on 10/27/2017 8:34:08 PM   Radiology Ct Head Wo Contrast  Result Date: 10/27/2017 CLINICAL DATA:  Intermittent LEFT  face and LEFT hand numbness for 2 days. EXAM: CT HEAD WITHOUT CONTRAST TECHNIQUE: Contiguous axial images were obtained from the base of the skull through the vertex without intravenous contrast. COMPARISON:  CT HEAD June 27, 2017 FINDINGS: BRAIN: No intraparenchymal hemorrhage, mass effect nor midline shift. The ventricles and sulci are normal. No acute large vascular territory infarcts. Subcentimeter subtle RIGHT parietal hypodensity may be extra-axial (axial image 25). Basal cisterns are patent. VASCULAR: Trace calcific atherosclerosis carotid siphon. Mildly dense RIGHT cortical vein (likely [place]). SKULL/SOFT TISSUES: No skull fracture. No significant soft tissue swelling. ORBITS/SINUSES: The included ocular globes and orbital contents are normal.Trace paranasal sinus mucosal thickening. Mastoid air cells are well aerated. OTHER: None. IMPRESSION: Small RIGHT parietal RIGHT parietal subtle hypodensity may be extra-axial. Given adjacent dense cortical vein, venous infarct is possible versus small mass. Recommend MRI of the head with and without contrast and contrast enhanced MRV. Electronically Signed   By: Elon Alas M.D.   On: 10/27/2017 19:08    Procedures Procedures (including critical care time)  Medications Ordered in ED Medications - No data to display   Initial Impression / Assessment and Plan / ED Course  I have reviewed the triage vital signs and the nursing notes.  Pertinent labs & imaging results that were available during my care of the patient were reviewed by me and considered in my medical decision making (see chart for details).    Patient is a 57 year old female presenting today with a week of intermittent left arm numbness and left facial involvement yesterday.  She is not currently feeling any facial numbness but is still having heaviness of her left arm.  She has no other complaints at this time but is found to be hypertensive.  Labs without acute findings.  EKG  within normal limits.  Head CT did show small right parietal subtle hypodensity concerning for possible infarct they recommended an MRI of the head with and without contrast as well as an MRV. The studies are pending.  Patient is otherwise in no acute distress and is stable.  11:02 PM MRI showing acute stroke.  Spoke with neurology and will admit to medicine for work up   Final Clinical Impressions(s) / ED Diagnoses   Final diagnoses:  Cerebrovascular accident (CVA), unspecified mechanism [facility])    ED Discharge Orders  None       Blanchie Dessert, MD 10/27/17 [id number]    Blanchie Dessert, MD 10/27/17 2314

## 2017-10-27 NOTE — Consult Note (Signed)
Neurology Consultation  Reason for Consult: Stroke Referring Physician: Dr. Roel Cluck  CC: Stroke  History is obtained from: Patient, chart  HPI: Angela Abbott is a 57 y.o. female past medical history of hypertension, tobacco abuse, presented to the emergency room for off and on left-sided numbness and weakness that started sometime around Saturday, 10/25/2017. She says that she had been noticing some weakness of her arm, where it felt like it weighed thousand pounds and that resolved spontaneously.  No particular weakness in the left leg.  She also felt some numbness on her face which has been intermittently off and on. She came into the hospital today because of the symptoms not resolving and her having difficulty with her left hand and smoking her cigarette, which is unusual for her. She has not had a stroke in the past.  She is not on antihypertensives or antiplatelets. She continues to smoke at least a pack a day. Denies any illicit drug use  In the emergency room, CT scan was done that was concerning for a hypodensity in the right parietal area and concerning for a venous infarct  LKW: Greater than 24 hours ago tpa given?: no, outside the window Premorbid modified Rankin scale (mRS):0  ROS: ROS was performed and is negative except as noted in the HPI.   Past Medical History:  Diagnosis Date  . Arthritis    shoulders, knees, hips  . Dental crowns present   . GERD (gastroesophageal reflux disease)   . Hypothyroidism   . Left knee pain 07/2014  . Pneumonia    hx of 02/2017   . Stress incontinence   . Wears partial dentures    upper and lower   Family History  Problem Relation Age of Onset  . CVA Other    Social History:   reports that she has been smoking cigarettes. She has a 17.50 pack-year smoking history. She has never used smokeless tobacco. She reports that she drinks alcohol. She reports that she does not use drugs.  Medications No current facility-administered  medications for this encounter.   Current Outpatient Medications:  .  estradiol (ESTRACE) 1 MG tablet, Take 1 mg by mouth daily. , Disp: , Rfl:  .  levothyroxine (SYNTHROID, LEVOTHROID) 125 MCG tablet, Take 125 mcg by mouth daily before breakfast., Disp: , Rfl:  .  omeprazole (PRILOSEC) 40 MG capsule, Take 1 capsule (40 mg total) by mouth daily., Disp: 30 capsule, Rfl: 0 .  tolterodine (DETROL LA) 4 MG 24 hr capsule, Take 4 mg by mouth daily., Disp: , Rfl:  .  oxyCODONE (OXY IR/ROXICODONE) 5 MG immediate release tablet, Take 1 tablet (5 mg total) by mouth every 6 (six) hours as needed for breakthrough pain. (Patient not taking: Reported on 10/27/2017), Disp: 20 tablet, Rfl: 0  Exam: Current vital signs: BP 139/90   Pulse 62   Temp 98.1 F (36.7 C) (Oral)   Resp 14   Ht 5' 6.25" (1.683 m)   Wt 112.5 kg   SpO2 100%   BMI 39.73 kg/m  Vital signs in last 24 hours: Temp:  [98.1 F (36.7 C)-98.8 F (37.1 C)] 98.1 F (36.7 C) (10/21 1933) Pulse Rate:  [62-79] 62 (10/21 2300) Resp:  [12-16] 14 (10/21 2300) BP: (139-195)/(74-120) 139/90 (10/21 2250) SpO2:  [98 %-100 %] 100 % (10/21 2300) Weight:  [112.5 kg] 112.5 kg (10/21 2026)  GENERAL: Awake, alert in NAD HEENT: - Normocephalic and atraumatic, dry mm, no LN++, no Thyromegally LUNGS - Clear to  auscultation bilaterally with no wheezes CV - S1S2 RRR, no m/r/g, equal pulses bilaterally. ABDOMEN - Soft, nontender, nondistended with normoactive BS Ext: warm, well perfused, intact peripheral pulses, no edema  NEURO:  Mental Status: AA&Ox3  Language: speech is clear naming, repetition, fluency, and comprehension intact. Cranial Nerves: PERRL. EOMI, visual fields full, no facial asymmetry, facial sensation intact, hearing intact, tongue/uvula/soft palate midline, normal sternocleidomastoid and trapezius muscle strength. No evidence of tongue atrophy or fibrillations Motor: Grossly 5/5 right upper and right lower extremity.  Subtle  weakness in the left upper extremity with pronator drift but no vertical drift 4+/5.  Left lower extremity 5/5. Tone: is normal and bulk is normal Sensation-decreased sensation on the left Coordination: FTN intact bilaterally, no ataxia in BLE. Gait- deferred  NIHSS-1  Labs I have reviewed labs in epic and the results pertinent to this consultation are:  CBC    Component Value Date/Time   WBC 8.6 10/27/2017 1638   RBC 4.93 10/27/2017 1638   HGB 13.6 10/27/2017 1659   HCT 40.0 10/27/2017 1659   PLT 267 10/27/2017 1638   MCV 86.2 10/27/2017 1638   MCH 26.6 10/27/2017 1638   MCHC 30.8 10/27/2017 1638   RDW 15.2 10/27/2017 1638   LYMPHSABS 2.6 10/27/2017 1638   MONOABS 0.7 10/27/2017 1638   EOSABS 0.1 10/27/2017 1638   BASOSABS 0.1 10/27/2017 1638    CMP     Component Value Date/Time   NA 139 10/27/2017 1659   K 4.8 10/27/2017 1659   CL 106 10/27/2017 1659   CO2 26 10/27/2017 1638   GLUCOSE 91 10/27/2017 1659   BUN 21 (H) 10/27/2017 1659   CREATININE 0.90 10/27/2017 1659   CALCIUM 9.2 10/27/2017 1638   PROT 7.2 10/27/2017 1638   ALBUMIN 3.5 10/27/2017 1638   AST 17 10/27/2017 1638   ALT 20 10/27/2017 1638   ALKPHOS 71 10/27/2017 1638   BILITOT 0.6 10/27/2017 1638   GFRNONAA >60 10/27/2017 1638   GFRAA >60 10/27/2017 1638   Imaging I have reviewed the images obtained: CT-scan of the brain-right parietal hypodensity.  MRI examination of the brain scattered looking embolic strokes in the right cerebral hemisphere.  Assessment:  58 year old woman with at least 2-day history of left-sided numbness and weakness that has been intermittently waxing and waning and MRI of the brain showing scattered embolic-looking strokes in the right cerebral hemisphere. Likely small vessel versus cardio Malik versus a thrombolic. Needs stroke work-up.  Impression: Acute ischemic stroke-etiology under investigation.  Recommendations: -Admit to hospitalist  -Telemetry  monitoring -Allow for permissive hypertension for the first 24-48h - only treat PRN if SBP >220 mmHg. Blood pressures can be gradually normalized to SBP<140 upon discharge. -CT Angiogram of Head and neck -Echocardiogram -HgbA1c, fasting lipid panel -Frequent neuro checks -Prophylactic therapy-Antiplatelet med: Aspirin - dose 325mg  PO or 300mg  PR -Atorvastatin 80 mg PO daily -Risk factor modification -I discussed the importance of exercise as well as smoking cessation -PT consult, OT consult, Speech consult  Please page stroke NP/PA/MD (listed on AMION)  from 8am-4 pm as this patient will be followed by the stroke team at this point.  -- Amie Portland, MD [facility] Pager: [phone] If 7pm to 7am, please call on call as listed on AMION.

## 2017-10-27 NOTE — ED Notes (Signed)
Pt not in room, provider bedside

## 2017-10-28 ENCOUNTER — Inpatient Hospital Stay (HOSPITAL_COMMUNITY): Payer: 59

## 2017-10-28 ENCOUNTER — Other Ambulatory Visit: Payer: Self-pay

## 2017-10-28 DIAGNOSIS — I5030 Unspecified diastolic (congestive) heart failure: Secondary | ICD-10-CM

## 2017-10-28 DIAGNOSIS — I63231 Cerebral infarction due to unspecified occlusion or stenosis of right carotid arteries: Secondary | ICD-10-CM

## 2017-10-28 DIAGNOSIS — G459 Transient cerebral ischemic attack, unspecified: Secondary | ICD-10-CM

## 2017-10-28 DIAGNOSIS — I6521 Occlusion and stenosis of right carotid artery: Secondary | ICD-10-CM

## 2017-10-28 LAB — URINALYSIS, ROUTINE W REFLEX MICROSCOPIC
Bilirubin Urine: NEGATIVE
GLUCOSE, UA: NEGATIVE mg/dL
HGB URINE DIPSTICK: NEGATIVE
KETONES UR: NEGATIVE mg/dL
Leukocytes, UA: NEGATIVE
Nitrite: NEGATIVE
PH: 7 (ref 5.0–8.0)
PROTEIN: NEGATIVE mg/dL
Specific Gravity, Urine: 1.034 — ABNORMAL HIGH (ref 1.005–1.030)

## 2017-10-28 LAB — RAPID URINE DRUG SCREEN, HOSP PERFORMED
AMPHETAMINES: NOT DETECTED
Barbiturates: NOT DETECTED
Benzodiazepines: NOT DETECTED
Cocaine: NOT DETECTED
OPIATES: NOT DETECTED
TETRAHYDROCANNABINOL: NOT DETECTED

## 2017-10-28 LAB — LIPID PANEL
CHOL/HDL RATIO: 2.4 ratio
Cholesterol: 44 mg/dL (ref 0–200)
HDL: 18 mg/dL — ABNORMAL LOW (ref >40)
LDL CALC: 20 mg/dL (ref 0–99)
Triglycerides: 32 mg/dL (ref <150)
VLDL: 6 mg/dL (ref 0–40)

## 2017-10-28 LAB — ECHOCARDIOGRAM COMPLETE
Height: 66.25 in
WEIGHTICAEL: 3968 [oz_av]

## 2017-10-28 LAB — TROPONIN I

## 2017-10-28 MED ORDER — STROKE: EARLY STAGES OF RECOVERY BOOK
Freq: Once | Status: DC
  Administered 2017-10-28: 03:00:00
  Filled 2017-10-28: qty 1

## 2017-10-28 MED ORDER — ATORVASTATIN CALCIUM 80 MG PO TABS: 80.0000 mg | ORAL_TABLET | Freq: Every day | ORAL | Status: DC

## 2017-10-28 MED ORDER — NICOTINE 21 MG/24HR TD PT24
21.0000 mg | MEDICATED_PATCH | Freq: Every day | TRANSDERMAL | Status: DC
  Administered 2017-10-28 (×2): 21 mg via TRANSDERMAL
  Filled 2017-10-28 (×2): qty 1

## 2017-10-28 MED ORDER — IOPAMIDOL (ISOVUE-370) INJECTION 76%
50.0000 mL | Freq: Once | INTRAVENOUS | Status: AC | PRN
  Administered 2017-10-28: 50 mL via INTRAVENOUS

## 2017-10-28 MED ORDER — PNEUMOCOCCAL VAC POLYVALENT 25 MCG/0.5ML IJ INJ
0.5000 mL | INJECTION | INTRAMUSCULAR | Status: AC
  Administered 2017-10-29: 0.5 mL via INTRAMUSCULAR
  Filled 2017-10-28: qty 0.5

## 2017-10-28 MED ORDER — ACETAMINOPHEN 650 MG RE SUPP: 650.0000 mg | RECTAL | Status: DC | PRN

## 2017-10-28 MED ORDER — FESOTERODINE FUMARATE ER 8 MG PO TB24
8.0000 mg | ORAL_TABLET | Freq: Every day | ORAL | Status: DC
  Administered 2017-10-28 – 2017-10-29 (×2): 8 mg via ORAL
  Filled 2017-10-28 (×2): qty 1

## 2017-10-28 MED ORDER — PANTOPRAZOLE SODIUM 40 MG PO TBEC
40.0000 mg | DELAYED_RELEASE_TABLET | Freq: Every day | ORAL | Status: DC
  Administered 2017-10-28 – 2017-10-29 (×2): 40 mg via ORAL
  Filled 2017-10-28 (×2): qty 1

## 2017-10-28 MED ORDER — SENNOSIDES-DOCUSATE SODIUM 8.6-50 MG PO TABS: 1.0000 | ORAL_TABLET | Freq: Every evening | ORAL | Status: DC | PRN

## 2017-10-28 MED ORDER — ACETAMINOPHEN 160 MG/5ML PO SOLN: 650.0000 mg | ORAL | Status: DC | PRN

## 2017-10-28 MED ORDER — ASPIRIN 325 MG PO TABS: 325.0000 mg | ORAL_TABLET | Freq: Every day | ORAL | Status: DC

## 2017-10-28 MED ORDER — STROKE: EARLY STAGES OF RECOVERY BOOK
Freq: Once | Status: AC
  Administered 2017-10-28: 03:00:00

## 2017-10-28 MED ORDER — CLOPIDOGREL BISULFATE 75 MG PO TABS
75.0000 mg | ORAL_TABLET | Freq: Every day | ORAL | Status: DC
  Administered 2017-10-28 – 2017-10-29 (×2): 75 mg via ORAL
  Filled 2017-10-28 (×2): qty 1

## 2017-10-28 MED ORDER — LEVOTHYROXINE SODIUM 25 MCG PO TABS
125.0000 ug | ORAL_TABLET | Freq: Every day | ORAL | Status: DC
  Administered 2017-10-28: 125 ug via ORAL
  Filled 2017-10-28: qty 1

## 2017-10-28 MED ORDER — SODIUM CHLORIDE 0.9 % IV SOLN
INTRAVENOUS | Status: DC
  Administered 2017-10-28: 03:00:00 via INTRAVENOUS

## 2017-10-28 MED ORDER — ACETAMINOPHEN 325 MG PO TABS: 650.0000 mg | ORAL_TABLET | ORAL | Status: DC | PRN

## 2017-10-28 MED ORDER — ASPIRIN 325 MG PO TABS
325.0000 mg | ORAL_TABLET | Freq: Every day | ORAL | Status: DC
  Administered 2017-10-28 – 2017-10-29 (×2): 325 mg via ORAL
  Filled 2017-10-28 (×2): qty 1

## 2017-10-28 MED ORDER — ASPIRIN 300 MG RE SUPP: 300.0000 mg | Freq: Every day | RECTAL | Status: DC

## 2017-10-28 MED ORDER — ATORVASTATIN CALCIUM 10 MG PO TABS: 20.0000 mg | ORAL_TABLET | Freq: Every day | ORAL | Status: DC

## 2017-10-28 NOTE — Progress Notes (Signed)
Physical Therapy Evaluation Patient Details Name: Angela Abbott MRN: [id number] DOB: [birth year] Today's Date: 10/28/2017   History of Present Illness  Pt is a 57 y.o. woman admitted to hospital for intermittent left-sided numbness and weakness. MRI of brain showed scattered embolic-looking strokes in the right cerebral hemisphere.  Clinical Impression  Pt tolerated evaluation well. Pt independent in mobility, transfers, gait and stair negotiation compared to baseline. At this time, anticipate pt is appropriate for d/c home with family support. No PT follow up recommended at this time.     Follow Up Recommendations No PT follow up    Equipment Recommendations  None recommended by PT    Recommendations for Other Services       Precautions / Restrictions Precautions Precautions: None Restrictions Weight Bearing Restrictions: No      Mobility  Bed Mobility Overal bed mobility: Independent             General bed mobility comments: HOB elevated, did not assess with flattened HOB. Pt able to go supine to sit without physical assistance or cueing.   Transfers Overall transfer level: Independent Equipment used: None Transfers: Sit to/from Stand Sit to Stand: Independent         General transfer comment: Pt performed sit to stand x3 without physical assistance or cueing  Ambulation/Gait Ambulation/Gait assistance: Independent Gait Distance (Feet): 200 Feet Assistive device: None Gait Pattern/deviations: Step-through pattern;Decreased stride length   Gait velocity interpretation: >2.62 ft/sec, indicative of community ambulatory General Gait Details: Pt able to ambulate without physical assistance or cueing and reports no dizziness or symptom provocation.   Stairs Stairs: Yes Stairs assistance: Modified independent (Device/Increase time) Stair Management: One rail Right;One rail Left Number of Stairs: 3 General stair comments: Pt negotiated stairs up and over in  hospital gym with use of Bil UE support  Wheelchair Mobility    Modified Rankin (Stroke Patients Only) Modified Rankin (Stroke Patients Only) Pre-Morbid Rankin Score: No symptoms Modified Rankin: No significant disability     Balance Overall balance assessment: Independent                               Standardized Balance Assessment Standardized Balance Assessment : Dynamic Gait Index(Full DGI not performed)   Dynamic Gait Index Level Surface: Normal Change in Gait Speed: Normal Gait with Horizontal Head Turns: Mild Impairment Gait with Vertical Head Turns: Mild Impairment Step Around Obstacles: Normal Steps: Mild Impairment       Pertinent Vitals/Pain Pain Assessment: No/denies pain    Home Living Family/patient expects to be discharged to:: Private residence Living Arrangements: Other relatives Available Help at Discharge: Family Type of Home: House Home Access: Stairs to enter Entrance Stairs-Rails: Can reach both Entrance Stairs-Number of Steps: 3 Home Layout: Two level Home Equipment: None Additional Comments: Pt able to live on first floor with access to bathroom    Prior Function Level of Independence: Independent         Comments: 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      Hand Dominance   Dominant Hand: Right    Extremity/Trunk Assessment   Upper Extremity Assessment Upper Extremity Assessment: Defer to OT evaluation    Lower Extremity Assessment Lower Extremity Assessment: Overall WFL for tasks assessed       Communication   Communication: No difficulties  Cognition Arousal/Alertness: Awake/alert Behavior During Therapy: [facility]  for tasks assessed/performed Overall Cognitive Status: Within Functional Limits for tasks assessed                                 General Comments: Pt family in room      General Comments General  comments (skin integrity, edema, etc.): Pt independent in mobility and ambulation. Required no cueing or physical assist. Instructed that once her IV is removed she can freely ambulate in room    Exercises     Assessment/Plan    PT Assessment Patent does not need any further PT services  PT Problem List         PT Treatment Interventions      PT Goals (Current goals can be found in the Care Plan section)  Acute Rehab PT Goals Patient Stated Goal: To go home PT Goal Formulation: With patient/family Time For Goal Achievement: 10/13/18 Potential to Achieve Goals: Good    Frequency     Barriers to discharge        Co-evaluation               AM-PAC PT "6 Clicks" Daily Activity  Outcome Measure Difficulty turning over in bed (including adjusting bedclothes, sheets and blankets)?: None Difficulty moving from lying on back to sitting on the side of the bed? : None Difficulty sitting down on and standing up from a chair with arms (e.g., wheelchair, bedside commode, etc,.)?: None Help needed moving to and from a bed to chair (including a wheelchair)?: None Help needed walking in hospital room?: None Help needed climbing 3-5 steps with a railing? : A Little 6 Click Score: 23    End of Session Equipment Utilized During Treatment: Gait belt Activity Tolerance: Patient tolerated treatment well Patient left: in chair;with call bell/phone within reach;with family/visitor present Nurse Communication: Mobility status PT Visit Diagnosis: Other symptoms and signs involving the nervous system (R29.898)    Time: 9390-3009 PT Time Calculation (min) (ACUTE ONLY): 21 min   Charges:   PT Evaluation $PT Eval Low Complexity: 1 Low          Selinda Michaels, SPT  Selinda Michaels 10/28/2017, 10:22 AM

## 2017-10-28 NOTE — Progress Notes (Signed)
Patient came in today to pick up custom made foot orthotics.  The goals were accomplished and the patient reported no dissatisfaction with said orthotics.  Patient was advised of breakin period and how to report any issues. 

## 2017-10-28 NOTE — Evaluation (Signed)
Speech Language Pathology Evaluation Patient Details Name: Angela Abbott MRN: [id number] DOB: 22-Nov-1960 Today's Date: 10/28/2017 Time: 7371-0626 SLP Time Calculation (min) (ACUTE ONLY): 31 min  Problem List:  Patient Active Problem List   Diagnosis Date Noted  . CVA (cerebral vascular accident) ([place]) 10/27/2017  . Tobacco abuse 10/27/2017  . Chest pain 02/14/2017  . Multifocal pneumonia 02/14/2017  . Hypothyroidism 02/14/2017  . GERD (gastroesophageal reflux disease) 02/14/2017  . Pneumonia 02/14/2017  . Cigarette smoker 11/13/2016  . Chronic cough 11/12/2016  . Sciatica of left side 05/18/2013  . Arthritis of wrist, left, degenerative 03/30/2013  . History of laparoscopic adjustable gastric banding, 02/17/2008 04/29/2012  . Morbid obesity ([place]) 01/16/2012  . PLANTAR FACIITIS 07/01/2007  . KNEE PAIN 04/29/2007  . TEAR MEDIAL MENISCUS 04/29/2007   Past Medical History:  Past Medical History:  Diagnosis Date  . Arthritis    shoulders, knees, hips  . Dental crowns present   . GERD (gastroesophageal reflux disease)   . Hypothyroidism   . Left knee pain 07/2014  . Pneumonia    hx of 02/2017   . Stress incontinence   . Wears partial dentures    upper and lower   Past Surgical History:  Past Surgical History:  Procedure Laterality Date  . ABDOMINAL HYSTERECTOMY     partial  . [place]  . ESOPHAGOGASTRODUODENOSCOPY N/A 11/26/2012   Procedure: ESOPHAGOGASTRODUODENOSCOPY (EGD);  Surgeon: Shann Medal, MD;  Location: [facility];  Service: General;  Laterality: N/A;  . GANGLION CYST EXCISION Right 02/2008   foot  . KNEE ARTHROSCOPY WITH LATERAL MENISECTOMY Left 07/21/2014   Procedure: LEFT KNEE ARTHROSCOPY,  CHONDROPLASTY, WITH LATERAL AND MEDIAL  MENISCECTOMIES;  Surgeon: Kathryne Hitch, MD;  Location: [place];  Service: Orthopedics;  Laterality: Left;  . KNEE ARTHROSCOPY WITH MEDIAL MENISECTOMY Left 07/21/2014   Procedure: KNEE  ARTHROSCOPY WITH MEDIAL MENISECTOMY;  Surgeon: Kathryne Hitch, MD;  Location: [place];  Service: Orthopedics;  Laterality: Left;  . LAPAROSCOPIC GASTRIC BANDING  02/18/2008  . LAPAROSCOPIC REPAIR AND REMOVAL OF GASTRIC BAND N/A 03/21/2017   Procedure: LAPAROSCOPIC REPAIR AND REMOVAL OF GASTRIC BAND;  Surgeon: [name], MD;  Location: [facility];  Service: General;  Laterality: N/A;  . PARTIAL HYSTERECTOMY  09/07/2002  . SHOULDER ARTHROSCOPY WITH SUBACROMIAL DECOMPRESSION, ROTATOR CUFF REPAIR AND BICEP TENDON REPAIR Right 11/23/2015   Procedure: RIGHT SHOULDER ARTHROSCOPY WITH DEBRIDEMENT, SUBACROMIAL DECOMPRESSION, DISTAL CLAVICLE EXCISION, ROTATOR CUFF REPAIR AND BICEP TENODESIS;  Surgeon: Ninetta Lights, MD;  Location: [place];  Service: Orthopedics;  Laterality: Right;   HPI:  57 yo female adm to [facility] with left sided weakness on and off.  Pt with PMH + for pna 02/2017, GERD, arthritis, tobacco use, HTN.  Pt passed [facility] swallow screen.  Pt found to have right parietal and occipital and temporal lobe CVA.     Assessment / Plan / Recommendation Clinical Impression  MOCA 7.2 given to pt with her scoring 25/30 with normal being 26/30.  She presents with facial nerve deficit impacting left but denies sensory changes *trigeminal nerve.  Pt's speech is fluent and adequate without dysarthria/aphasia.  Pt appears impulsive but suspect this is baseline.      SLP Assessment  SLP Recommendation/Assessment: Patient does not need any further Speech Lanaguage Pathology Services    Follow Up Recommendations  None    Frequency and Duration     n/a      SLP Evaluation  Cognition  Overall Cognitive Status: Within Functional Limits for tasks assessed Arousal/Alertness: Awake/alert Orientation Level: Oriented X4 Attention: Sustained;Selective Sustained Attention: Appears intact Selective Attention: Appears intact Memory: Impaired Memory Impairment: Retrieval deficit(3/5  independent, 2/5 with cues) Awareness: Appears intact Problem Solving: Appears intact Safety/Judgment: Appears intact       Comprehension  Auditory Comprehension Overall Auditory Comprehension: Appears within functional limits for tasks assessed Yes/No Questions: Not tested Commands: Within Functional Limits Conversation: Complex Visual Recognition/Discrimination Discrimination: Within Function Limits Reading Comprehension Reading Status: Within funtional limits    Expression Expression Primary Mode of Expression: Verbal Verbal Expression Overall Verbal Expression: Appears within functional limits for tasks assessed Initiation: No impairment Repetition: Impaired(missing few pronouns articles in one sentence, distracted by IV beeping) Naming: No impairment Non-Verbal Means of Communication: Not applicable Written Expression Dominant Hand: Right   Oral / Motor  Oral Motor/Sensory Function Overall Oral Motor/Sensory Function: Mild impairment Facial ROM: Reduced right Facial Symmetry: Abnormal symmetry right Facial Strength: Reduced right Facial Sensation: Within Functional Limits Lingual ROM: Within Functional Limits Lingual Symmetry: Within Functional Limits Lingual Strength: Within Functional Limits Lingual Sensation: Within Functional Limits Velum: Within Functional Limits Mandible: Within Functional Limits Motor Speech Overall Motor Speech: Appears within functional limits for tasks assessed Respiration: Within functional limits Phonation: Normal Resonance: Within functional limits Articulation: Within functional limitis Intelligibility: Intelligible Motor Planning: Witnin functional limits Motor Speech Errors: Not applicable   GO                    Macario Golds 10/28/2017, 8:25 AM  Luanna Salk, MS [facility] SLP Acute Rehab Services Pager [phone] Office [phone]

## 2017-10-28 NOTE — Consult Note (Addendum)
Hospital Consult    Reason for Consult:  Symptomatic carotid artery stenosis Requesting Physician:  Nevada Crane MRN #:  [id number]  History of Present Illness: This is a 57 y.o. female who states she started getting some numbness of her fingers last Thursday and that it was just intermittent and didn't last longer than a minute when it happened..  She states that she that she thought it was from her previous shoulder surgery.  She says that she was watching football on Sunday and starting having weakness in her left arm.  When she was at work on yesterday, she felt tingling and numbness in her face.  She presented to the nurses station at work and was found to be hypertensive at the time and she was brought to the hospital by ambulance for further workup.  She states that she smokes with her left hand and drinks her coffee with her left hand and she has had some clumsiness having trouble holding holding her cigarette and coffee.   She denies any temporary blindness or speech problems.  She states that her left leg was not affected.    She did have a carotid duplex, which revealed 80-99% right carotid artery stenosis and 40-59% left carotid artery stenosis.   She smokes a pack of cigarettes per day.  She states that her mother had hx of three TIA's but also had dementia.  She has a hx of lap band in the past and came off of her BP meds at that time.  She states she had it reversed earlier this year due to complications with the lap band.   She works at what was previously [employer].   Since being in the hospital, she has been placed on asa and Plavix.   She states that she was not put on a statin b/c her cholesterol was good.   Past Medical History:  Diagnosis Date  . Arthritis    shoulders, knees, hips  . Dental crowns present   . GERD (gastroesophageal reflux disease)   . Hypothyroidism   . Left knee pain 07/2014  . Pneumonia    hx of 02/2017   . Stress incontinence   . Wears partial dentures    upper and lower    Past Surgical History:  Procedure Laterality Date  . ABDOMINAL HYSTERECTOMY     partial  . [place]  . ESOPHAGOGASTRODUODENOSCOPY N/A 11/26/2012   Procedure: ESOPHAGOGASTRODUODENOSCOPY (EGD);  Surgeon: Shann Medal, MD;  Location: [facility];  Service: General;  Laterality: N/A;  . GANGLION CYST EXCISION Right 02/2008   foot  . KNEE ARTHROSCOPY WITH LATERAL MENISECTOMY Left 07/21/2014   Procedure: LEFT KNEE ARTHROSCOPY,  CHONDROPLASTY, WITH LATERAL AND MEDIAL  MENISCECTOMIES;  Surgeon: Kathryne Hitch, MD;  Location: [place];  Service: Orthopedics;  Laterality: Left;  . KNEE ARTHROSCOPY WITH MEDIAL MENISECTOMY Left 07/21/2014   Procedure: KNEE ARTHROSCOPY WITH MEDIAL MENISECTOMY;  Surgeon: Kathryne Hitch, MD;  Location: [place];  Service: Orthopedics;  Laterality: Left;  . LAPAROSCOPIC GASTRIC BANDING  02/18/2008  . LAPAROSCOPIC REPAIR AND REMOVAL OF GASTRIC BAND N/A 03/21/2017   Procedure: LAPAROSCOPIC REPAIR AND REMOVAL OF GASTRIC BAND;  Surgeon: [name], MD;  [facility];  Service: General;  Laterality: N/A;  . PARTIAL HYSTERECTOMY  09/07/2002  . SHOULDER ARTHROSCOPY WITH SUBACROMIAL DECOMPRESSION, ROTATOR CUFF REPAIR AND BICEP TENDON REPAIR Right 11/23/2015   Procedure: RIGHT SHOULDER ARTHROSCOPY WITH DEBRIDEMENT, SUBACROMIAL DECOMPRESSION, DISTAL CLAVICLE EXCISION, ROTATOR CUFF REPAIR  AND BICEP TENODESIS;  Surgeon: Ninetta Lights, MD;  Location: [place];  Service: Orthopedics;  Laterality: Right;    Allergies  Allergen Reactions  . Bee Venom Shortness Of Breath    Prior to Admission medications   Medication Sig Start Date End Date Taking? Authorizing Provider  estradiol (ESTRACE) 1 MG tablet Take 1 mg by mouth daily.    Yes [provider]  levothyroxine (SYNTHROID, LEVOTHROID) 125 MCG tablet Take 125 mcg by mouth daily before breakfast.   Yes [provider]    omeprazole (PRILOSEC) 40 MG capsule Take 1 capsule (40 mg total) by mouth daily. 02/15/17  Yes Kathie Dike, MD  tolterodine (DETROL LA) 4 MG 24 hr capsule Take 4 mg by mouth daily.   Yes [provider]  oxyCODONE (OXY IR/ROXICODONE) 5 MG immediate release tablet Take 1 tablet (5 mg total) by mouth every 6 (six) hours as needed for breakthrough pain. Patient not taking: Reported on 10/27/2017 03/21/17   [name], MD    Social History   Socioeconomic History  . Marital status: Divorced    Spouse name: Not on file  . Number of children: Not on file  . Years of education: Not on file  . Highest education level: Not on file  Occupational History  . Not on file  Social Needs  . Financial resource strain: Not on file  . Food insecurity:    Worry: Not on file    Inability: Not on file  . Transportation needs:    Medical: Not on file    Non-medical: Not on file  Tobacco Use  . Smoking status: Current Every Day Smoker    Packs/day: 0.50    Years: 35.00    Pack years: 17.50    Types: Cigarettes  . Smokeless tobacco: Never Used  . Tobacco comment: has discussed with primary MD  Substance and Sexual Activity  . Alcohol use: Yes    Comment: occasionally  . Drug use: No  . Sexual activity: Not on file  Lifestyle  . Physical activity:    Days per week: Not on file    Minutes per session: Not on file  . Stress: Not on file  Relationships  . Social connections:    Talks on phone: Not on file    Gets together: Not on file    Attends religious service: Not on file    Active member of club or organization: Not on file    Attends meetings of clubs or organizations: Not on file    Relationship status: Not on file  . Intimate partner violence:    Fear of current or ex partner: Not on file    Emotionally abused: Not on file    Physically abused: Not on file    Forced sexual activity: Not on file  Other Topics Concern  . Not on file  Social History Narrative  . Not on  file     Family History  Problem Relation Age of Onset  . CVA Other   . Diabetes Other   . CAD Other   . CVA Mother   . Cancer Neg Hx     ROS: [x]  Positive   [ ]  Negative   [ ]  All sytems reviewed and are negative  Cardiac: []  chest pain/pressure []  palpitations []  SOB lying flat []  DOE  Vascular: []  pain in legs while walking []  pain in legs at rest []  pain in legs at night []  non-healing ulcers []   hx of DVT []  swelling in legs  Pulmonary: []  productive cough []  asthma/wheezing []  home O2  Neurologic: [x]  weakness in [x]  left arm []  legs [x]  numbness in [x]  left arm []  legs [x]  hx of CVA []  mini stroke [] difficulty speaking or slurred speech []  temporary loss of vision in one eye []  dizziness  Hematologic: []  hx of cancer []  bleeding problems []  problems with blood clotting easily  Endocrine:   []  diabetes [x]  thyroid disease  GI [x]  hx of gastric lap band and removal  [x]  GERD  GU: [x]  stress incontinence   Psychiatric: []  anxiety []  depression  Musculoskeletal: []  hx bilateral shoulder surgery  Integumentary: []  rashes []  ulcers  Constitutional: []  fever []  chills   Physical Examination  Vitals:   10/28/17 0818 10/28/17 1243  BP: (!) 141/93 (!) 111/42  Pulse: 68 64  Resp: 16 18  Temp: 97.7 F (36.5 C) 98.1 F (36.7 C)  SpO2: 96% 93%   Body mass index is 39.73 kg/m.  General:  WDWN in NAD Gait: Not observed HENT: WNL, normocephalic Pulmonary: normal non-labored breathing, without Rales, rhonchi,  wheezing Cardiac: regular, without  Murmurs, rubs or gallops; without carotid bruits Abdomen:  soft, NT/ND, no masses Skin: without rashes Vascular Exam/Pulses: +palpable radial pulses bilaterally; +palpable DP pulses bilaterally Extremities: without ischemic changes, without Gangrene , without cellulitis; without open wounds;  Musculoskeletal: no muscle wasting or atrophy  Neurologic: A&O X 3;  No focal weakness or  paresthesias are detected; speech is fluent/normal Psychiatric:  The pt has Normal affect. Lymph:  No inguinal lymphadenopathy   CBC    Component Value Date/Time   WBC 8.6 10/27/2017 1638   RBC 4.93 10/27/2017 1638   HGB 13.6 10/27/2017 1659   HCT 40.0 10/27/2017 1659   PLT 267 10/27/2017 1638   MCV 86.2 10/27/2017 1638   MCH 26.6 10/27/2017 1638   MCHC 30.8 10/27/2017 1638   RDW 15.2 10/27/2017 1638   LYMPHSABS 2.6 10/27/2017 1638   MONOABS 0.7 10/27/2017 1638   EOSABS 0.1 10/27/2017 1638   BASOSABS 0.1 10/27/2017 1638    BMET    Component Value Date/Time   NA 139 10/27/2017 1659   K 4.8 10/27/2017 1659   CL 106 10/27/2017 1659   CO2 26 10/27/2017 1638   GLUCOSE 91 10/27/2017 1659   BUN 21 (H) 10/27/2017 1659   CREATININE 0.90 10/27/2017 1659   CALCIUM 9.2 10/27/2017 1638   GFRNONAA >60 10/27/2017 1638   GFRAA >60 10/27/2017 1638    COAGS: Lab Results  Component Value Date   INR 0.98 10/27/2017   INR 0.9 RATIO 03/10/2007     Non-Invasive Vascular Imaging:   Carotid duplex 10/28/17: Carotid Duplex (Doppler) has been completed.  Findings suggest 80-99% right internal carotid artery stenosis and 40-59% left internal carotid artery stenosis. Vertebral arteries are patent with antegrade flow.  Statin:  No. Beta Blocker:  No. Aspirin:  Yes.   ACEI:  No. ARB:  No. CCB use:  No Other antiplatelets/anticoagulants:  Yes.   Plavix   ASSESSMENT/PLAN: This is a 57 y.o. female with symptomatic right carotid artery stenosis and moderate asymptomatic left carotid artery stenosis   -pt's strength is getting stronger in the LUE-her grips and strength are equal on exam.  Will need right carotid endarterectomy vs stent.  Dr. Trula Slade to review CTA of head/neck to determine.  Dr. Trula Slade will be by to see pt later today.  -continue asa/plavix. -greater than 3 minutes spent talking to  pt about smoking cessation and importance of this.     Leontine Locket, PA-C [facility] [phone]  I agree with the above.  I have seen and evaluated the patient.  Briefly, this is a 57 year old female who presented with left facial and left arm weakness.  She came to the emergency department for her work-up.  Her MRI revealed multiple right brain strokes.  She had a CT scan that showed approximately 50% right carotid stenosis and ultrasound showed greater than 80% stenosis.  The patient is nearly back to baseline from a neurological standpoint.  She does have a strong family history of cardiovascular disease.  She has been seen and evaluated by the stroke team who recommended carotid endarterectomy.  I have reviewed all of her data and feel that it is appropriate to proceed with right carotid endarterectomy.  I discussed the risk and benefits the operation including the risk of stroke, nerve injury, and bleeding as well as possibility of recurrence.  We discussed endarterectomy versus stenting and elected to proceed with endarterectomy.  She will be placed on the schedule for next week.  From my standpoint she can be discharged home and my office will contact her for scheduling her operation.  Annamarie Major

## 2017-10-28 NOTE — Progress Notes (Addendum)
PROGRESS NOTE  Angela Abbott AOZ:[id number] DOB: February 15, 1960 DOA: 10/27/2017 PCP: Sharilyn Sites, MD  HPI/Recap of past 24 hours: Angela Abbott is a 57 y.o. female with medical history significant of tobacco abuse on estrogen hormone, hypothyroidism, and obesity who presented to the ED with left-sided numbness of her face and arm. For 1 week she has been having intermittent tingling of her left hand.  Then developed heaviness and numbness in her left arm and decided to be seen.  So she presented to the ED.  CT head done in the ED revealed hypodensity in the right parietal area with concern for venous infarct.  MRI brain revealed several small scattered foci of acute and subacute infarction in the right parietal lobe and in the right occipital temporal junction.  10/28/2017: Patient seen and examined at her bedside.  States her left arm weakness is improving.  Being assessed by speech therapy for swallow eval.  PT assessed and had no further recommendations.  Assessment/Plan: Active Problems:   Hypothyroidism   GERD (gastroesophageal reflux disease)   CVA (cerebral vascular accident) ([place])   Tobacco abuse  Acute ischemic right parietal and right occipital temporal junction CVA Independently reviewed MRI done on 10/27/2017 which revealed several small scattered foci of acute and subacute infarction in the right parietal lobe in the right occipitotemporal junction No acute hemorrhage noted. Risk factors include estrogen use in the setting of 40+ tobacco use Continue to hold off estrogen PT assessed no further recommendations Speech therapy assessed and no further recommendations 2D echo completed and results are pending Continue to monitor heart rhythm on telemetry Independently reviewed twelve-lead EKG done on 10/27/2017 which revealed sinus rhythm with rate of 82 LDL 20 Started on aspirin and Plavix Antiplatelets per neurology  Severe right ICA stenosis 80 to 99% Vascular surgery  consulted Dr. Trula Slade N.p.o. after midnight due to possible surgical intervention  Tobacco use disorder in the setting of estrogen hormone use Follow-up with your gynecologist to resume use of estrogen if continues to smoke Tobacco counseling cessation at bedside  Hypothyroidism Continue levothyroxine  GERD Continue Protonix  Obesity BMI 39 Recommend weight loss outpatient with regular physical activity and healthy dieting  Hypertension Not on antihypertensive medications at home Allow for permissive hypertension Continue to monitor vital signs    Code Status: Full code  Family Communication: None at bedside  Disposition Plan: Home possibly tomorrow 10/29/2017   Consultants:  Neurology  Procedures:  None  Antimicrobials:  None  DVT prophylaxis: Subcu Lovenox daily   Objective: Vitals:   10/28/17 0131 10/28/17 0239 10/28/17 0434 10/28/17 0818  BP: (!) 164/67 119/76 (!) 144/64 (!) 141/93  Pulse: 60 68 60 68  Resp: 18 16  16   Temp: 97.7 F (36.5 C) 97.8 F (36.6 C) 97.9 F (36.6 C) 97.7 F (36.5 C)  TempSrc: Oral Oral Oral Oral  SpO2: 97% 98% 99% 96%  Weight:      Height:        Intake/Output Summary (Last 24 hours) at 10/28/2017 1117 Last data filed at 10/28/2017 0357 Gross per 24 hour  Intake 75 ml  Output -  Net 75 ml   Filed Weights   10/27/17 2026  Weight: 112.5 kg    Exam:  . General: 57 y.o. year-old female well developed well nourished in no acute distress.  Alert and oriented x3. . Cardiovascular: Regular rate and rhythm with no rubs or gallops.  No thyromegaly or JVD noted.   Marland Kitchen Respiratory: Clear  to auscultation with no wheezes or rales. Good inspiratory effort. . Abdomen: Soft nontender nondistended with normal bowel sounds x4 quadrants. . Musculoskeletal: No lower extremity edema. 2/4 pulses in all 4 extremities.  Mild weakness noted in left upper extremity 4 out of 5 strength. . Skin: No ulcerative lesions noted or  rashes, . Psychiatry: Mood is appropriate for condition and setting   Data Reviewed: CBC: Recent Labs  Lab 10/27/17 1638 10/27/17 1659  WBC 8.6  --   NEUTROABS 5.1  --   HGB 13.1 13.6  HCT 42.5 40.0  MCV 86.2  --   PLT 267  --    Basic Metabolic Panel: Recent Labs  Lab 10/27/17 1638 10/27/17 1659  NA 139 139  K 4.1 4.8  CL 105 106  CO2 26  --   GLUCOSE 92 91  BUN 12 21*  CREATININE 0.95 0.90  CALCIUM 9.2  --    GFR: Estimated Creatinine Clearance: 87 mL/min (by C-G formula based on SCr of 0.9 mg/dL). Liver Function Tests: Recent Labs  Lab 10/27/17 1638  AST 17  ALT 20  ALKPHOS 71  BILITOT 0.6  PROT 7.2  ALBUMIN 3.5   No results for input(s): LIPASE, AMYLASE in the last 168 hours. No results for input(s): AMMONIA in the last 168 hours. Coagulation Profile: Recent Labs  Lab 10/27/17 1638  INR 0.98   Cardiac Enzymes: Recent Labs  Lab 10/28/17 0437 10/28/17 0942  TROPONINI <0.03 <0.03   BNP (last 3 results) No results for input(s): PROBNP in the last 8760 hours. HbA1C: No results for input(s): HGBA1C in the last 72 hours. CBG: No results for input(s): GLUCAP in the last 168 hours. Lipid Profile: Recent Labs    10/28/17 0437  CHOL 44  HDL 18*  LDLCALC 20  TRIG 32  CHOLHDL 2.4   Thyroid Function Tests: No results for input(s): TSH, T4TOTAL, FREET4, T3FREE, THYROIDAB in the last 72 hours. Anemia Panel: No results for input(s): VITAMINB12, FOLATE, FERRITIN, TIBC, IRON, RETICCTPCT in the last 72 hours. Urine analysis:    Component Value Date/Time   COLORURINE YELLOW 10/28/2017 [place] 10/28/2017 0657   LABSPEC 1.034 (H) 10/28/2017 0657   PHURINE 7.0 10/28/2017 [place] 10/28/2017 0657   HGBUR NEGATIVE 10/28/2017 0657   BILIRUBINUR NEGATIVE 10/28/2017 0657   KETONESUR NEGATIVE 10/28/2017 0657   PROTEINUR NEGATIVE 10/28/2017 0657   UROBILINOGEN 0.2 07/07/2013 1432   NITRITE NEGATIVE 10/28/2017 0657    LEUKOCYTESUR NEGATIVE 10/28/2017 0657   Sepsis Labs: @LABRCNTIP (procalcitonin:4,lacticidven:4)  )No results found for this or any previous visit (from the past 240 hour(s)).    Studies: Ct Angio Head W Or Wo Contrast  Result Date: 10/28/2017 CLINICAL DATA:  Left hand and face intermittent numbness EXAM: CT ANGIOGRAPHY HEAD AND NECK TECHNIQUE: Multidetector CT imaging of the head and neck was performed using the standard protocol during bolus administration of intravenous contrast. Multiplanar CT image reconstructions and MIPs were obtained to evaluate the vascular anatomy. Carotid stenosis measurements (when applicable) are obtained utilizing NASCET criteria, using the distal internal carotid diameter as the denominator. CONTRAST:  76mL ISOVUE-370 IOPAMIDOL (ISOVUE-370) INJECTION 76% COMPARISON:  None. FINDINGS: CTA NECK FINDINGS AORTIC ARCH: There is no calcific atherosclerosis of the aortic arch. There is no aneurysm, dissection or hemodynamically significant stenosis of the visualized ascending aorta and aortic arch. Conventional 3 vessel aortic branching pattern. The visualized proximal subclavian arteries are widely patent. RIGHT CAROTID SYSTEM: --Common carotid artery: Widely patent  origin without common carotid artery dissection or aneurysm. --Internal carotid artery: No dissection, occlusion or aneurysm. Mixed calcified and non-calcified atherosclerotic disease at the bifurcation, extending into the internal carotid artery, resulting in 50% stenosis. --External carotid artery: No acute abnormality. LEFT CAROTID SYSTEM: --Common carotid artery: Widely patent origin without common carotid artery dissection or aneurysm. --Internal carotid artery:No dissection, occlusion or aneurysm. Mixed calcified and non-calcified atherosclerotic disease at the bifurcation, extending into the internal carotid artery, resulting in 20% stenosis. --External carotid artery: No acute abnormality. VERTEBRAL ARTERIES:  Right dominant configuration. Both origins are normal. No dissection, occlusion or flow-limiting stenosis to the vertebrobasilar confluence. SKELETON: There is no bony spinal canal stenosis. No lytic or blastic lesion. OTHER NECK: Normal pharynx, larynx and major salivary glands. No cervical lymphadenopathy. Unremarkable thyroid gland. UPPER CHEST: No pneumothorax or pleural effusion. No nodules or masses. CTA HEAD FINDINGS ANTERIOR CIRCULATION: --Intracranial internal carotid arteries: Normal. --Anterior cerebral arteries: Normal. Both A1 segments are present. Patent anterior communicating artery. --Middle cerebral arteries: Normal. --Posterior communicating arteries: Absent bilaterally. POSTERIOR CIRCULATION: --Basilar artery: Normal. --Posterior cerebral arteries: Short segment severe stenosis of the right PCA P2 segment. There is occlusion of the left PCA P2 segment. --Superior cerebellar arteries: Normal. --Inferior cerebellar arteries: Normal anterior and posterior inferior cerebellar arteries. VENOUS SINUSES: As permitted by contrast timing, patent. ANATOMIC VARIANTS: None DELAYED PHASE: No parenchymal contrast enhancement. Review of the MIP images confirms the above findings. IMPRESSION: 1. Occlusion of the midportion of the left PCA P2 segment and severe stenosis of the proximal right PCA P2 segment. 2. No other occlusion or hemodynamically significant stenosis of the intracranial arteries. 3. 50% stenosis of the proximal right ICA. 20% stenosis of the proximal left ICA. Electronically Signed   By: Ulyses Jarred M.D.   On: 10/28/2017 03:33   Dg Chest 2 View  Result Date: 10/27/2017 CLINICAL DATA:  CVA EXAM: CHEST - 2 VIEW COMPARISON:  03/14/2017 FINDINGS: The heart size and mediastinal contours are within normal limits. Both lungs are clear. Mild widening of the right [facility] joint with possible surgical changes at the distal right clavicle. IMPRESSION: No active cardiopulmonary disease. Electronically  Signed   By: Donavan Foil M.D.   On: 10/27/2017 23:45   Ct Head Wo Contrast  Result Date: 10/27/2017 CLINICAL DATA:  Intermittent LEFT face and LEFT hand numbness for 2 days. EXAM: CT HEAD WITHOUT CONTRAST TECHNIQUE: Contiguous axial images were obtained from the base of the skull through the vertex without intravenous contrast. COMPARISON:  CT HEAD June 27, 2017 FINDINGS: BRAIN: No intraparenchymal hemorrhage, mass effect nor midline shift. The ventricles and sulci are normal. No acute large vascular territory infarcts. Subcentimeter subtle RIGHT parietal hypodensity may be extra-axial (axial image 25). Basal cisterns are patent. VASCULAR: Trace calcific atherosclerosis carotid siphon. Mildly dense RIGHT cortical vein (likely [place]). SKULL/SOFT TISSUES: No skull fracture. No significant soft tissue swelling. ORBITS/SINUSES: The included ocular globes and orbital contents are normal.Trace paranasal sinus mucosal thickening. Mastoid air cells are well aerated. OTHER: None. IMPRESSION: Small RIGHT parietal RIGHT parietal subtle hypodensity may be extra-axial. Given adjacent dense cortical vein, venous infarct is possible versus small mass. Recommend MRI of the head with and without contrast and contrast enhanced MRV. Electronically Signed   By: Elon Alas M.D.   On: 10/27/2017 19:08   Ct Angio Neck W Or Wo Contrast  Result Date: 10/28/2017 CLINICAL DATA:  Left hand and face intermittent numbness EXAM: CT ANGIOGRAPHY HEAD AND NECK TECHNIQUE: Multidetector CT imaging  of the head and neck was performed using the standard protocol during bolus administration of intravenous contrast. Multiplanar CT image reconstructions and MIPs were obtained to evaluate the vascular anatomy. Carotid stenosis measurements (when applicable) are obtained utilizing NASCET criteria, using the distal internal carotid diameter as the denominator. CONTRAST:  46mL ISOVUE-370 IOPAMIDOL (ISOVUE-370) INJECTION 76% COMPARISON:   None. FINDINGS: CTA NECK FINDINGS AORTIC ARCH: There is no calcific atherosclerosis of the aortic arch. There is no aneurysm, dissection or hemodynamically significant stenosis of the visualized ascending aorta and aortic arch. Conventional 3 vessel aortic branching pattern. The visualized proximal subclavian arteries are widely patent. RIGHT CAROTID SYSTEM: --Common carotid artery: Widely patent origin without common carotid artery dissection or aneurysm. --Internal carotid artery: No dissection, occlusion or aneurysm. Mixed calcified and non-calcified atherosclerotic disease at the bifurcation, extending into the internal carotid artery, resulting in 50% stenosis. --External carotid artery: No acute abnormality. LEFT CAROTID SYSTEM: --Common carotid artery: Widely patent origin without common carotid artery dissection or aneurysm. --Internal carotid artery:No dissection, occlusion or aneurysm. Mixed calcified and non-calcified atherosclerotic disease at the bifurcation, extending into the internal carotid artery, resulting in 20% stenosis. --External carotid artery: No acute abnormality. VERTEBRAL ARTERIES: Right dominant configuration. Both origins are normal. No dissection, occlusion or flow-limiting stenosis to the vertebrobasilar confluence. SKELETON: There is no bony spinal canal stenosis. No lytic or blastic lesion. OTHER NECK: Normal pharynx, larynx and major salivary glands. No cervical lymphadenopathy. Unremarkable thyroid gland. UPPER CHEST: No pneumothorax or pleural effusion. No nodules or masses. CTA HEAD FINDINGS ANTERIOR CIRCULATION: --Intracranial internal carotid arteries: Normal. --Anterior cerebral arteries: Normal. Both A1 segments are present. Patent anterior communicating artery. --Middle cerebral arteries: Normal. --Posterior communicating arteries: Absent bilaterally. POSTERIOR CIRCULATION: --Basilar artery: Normal. --Posterior cerebral arteries: Short segment severe stenosis of the right  PCA P2 segment. There is occlusion of the left PCA P2 segment. --Superior cerebellar arteries: Normal. --Inferior cerebellar arteries: Normal anterior and posterior inferior cerebellar arteries. VENOUS SINUSES: As permitted by contrast timing, patent. ANATOMIC VARIANTS: None DELAYED PHASE: No parenchymal contrast enhancement. Review of the MIP images confirms the above findings. IMPRESSION: 1. Occlusion of the midportion of the left PCA P2 segment and severe stenosis of the proximal right PCA P2 segment. 2. No other occlusion or hemodynamically significant stenosis of the intracranial arteries. 3. 50% stenosis of the proximal right ICA. 20% stenosis of the proximal left ICA. Electronically Signed   By: Ulyses Jarred M.D.   On: 10/28/2017 03:33   Mr Jeri Cos And Wo Contrast  Result Date: 10/27/2017 CLINICAL DATA:  57 y/o F; numbness in the left hand and left face intermittent since Saturday. Symptoms worse today. EXAM: MRI HEAD WITHOUT AND WITH CONTRAST MRV HEAD WITHOUT CONTRAST TECHNIQUE: Multiplanar, multiecho pulse sequences of the brain and surrounding structures were obtained without with intravenous contrast. Angiographic images of the intracranial venous structures were obtained using MRV technique without intravenous contrast. COMPARISON:  10/27/2017 CT head CONTRAST:  10 cc Gadavist FINDINGS: MR HEAD FINDINGS Brain: There several scattered foci of reduced diffusion in the right parietal lobe in the right occipitotemporal junction. Some of the lesions have variable diffusion, for example the right occipitotemporal lesion is hypointense on ADC (series 350, image 22) and the right postcentral gyrus lesion is mildly hypointense/near isointense on ADC (series 305, image 47). Additionally, some of the lesions enhance. Findings are consistent with mixed age acute and subacute infarctions. No focal mass effect, extra-axial collection, hydrocephalus, or herniation. A few punctate foci of susceptibility  hypointensity are present in the right parietal lobe, discrete from areas of ischemia, compatible hemosiderin deposition of chronic microhemorrhage. Vascular: Normal flow voids. Skull and upper cervical spine: Normal marrow signal. Sinuses/Orbits: Mild mucosal thickening of the ethmoid and sphenoid sinuses. No abnormal signal of the mastoid air cells. Orbits are unremarkable. Other: None. MRV HEAD FINDINGS Normal flow related signal within the superior sagittal sinus, internal cerebral veins, basal veins of Rosenthal, straight sinus, bilateral transverse sinus, bilateral sigmoid sinus, and bilateral upper internal jugular veins. Additionally, there is normal flow related signal within the large cortical veins. No evidence of dural venous sinus thrombosis. IMPRESSION: 1. Several small scattered foci of acute and subacute infarction (mixed age) are present in the right parietal lobe and the right occipitotemporal junction. No acute hemorrhage. No significant mass effect. 2. Normal MRV.  No evidence of dural venous sinus thrombosis. These results were called by telephone at the time of interpretation on 10/27/2017 at 10:45 pm to Dr. Maryan Rued, who verbally acknowledged these results. Electronically Signed   By: Kristine Garbe M.D.   On: 10/27/2017 22:50   Mr Mrv Head Wo Cm  Result Date: 10/27/2017 CLINICAL DATA:  57 y/o F; numbness in the left hand and left face intermittent since Saturday. Symptoms worse today. EXAM: MRI HEAD WITHOUT AND WITH CONTRAST MRV HEAD WITHOUT CONTRAST TECHNIQUE: Multiplanar, multiecho pulse sequences of the brain and surrounding structures were obtained without with intravenous contrast. Angiographic images of the intracranial venous structures were obtained using MRV technique without intravenous contrast. COMPARISON:  10/27/2017 CT head CONTRAST:  10 cc Gadavist FINDINGS: MR HEAD FINDINGS Brain: There several scattered foci of reduced diffusion in the right parietal lobe in  the right occipitotemporal junction. Some of the lesions have variable diffusion, for example the right occipitotemporal lesion is hypointense on ADC (series 350, image 22) and the right postcentral gyrus lesion is mildly hypointense/near isointense on ADC (series 305, image 47). Additionally, some of the lesions enhance. Findings are consistent with mixed age acute and subacute infarctions. No focal mass effect, extra-axial collection, hydrocephalus, or herniation. A few punctate foci of susceptibility hypointensity are present in the right parietal lobe, discrete from areas of ischemia, compatible hemosiderin deposition of chronic microhemorrhage. Vascular: Normal flow voids. Skull and upper cervical spine: Normal marrow signal. Sinuses/Orbits: Mild mucosal thickening of the ethmoid and sphenoid sinuses. No abnormal signal of the mastoid air cells. Orbits are unremarkable. Other: None. MRV HEAD FINDINGS Normal flow related signal within the superior sagittal sinus, internal cerebral veins, basal veins of Rosenthal, straight sinus, bilateral transverse sinus, bilateral sigmoid sinus, and bilateral upper internal jugular veins. Additionally, there is normal flow related signal within the large cortical veins. No evidence of dural venous sinus thrombosis. IMPRESSION: 1. Several small scattered foci of acute and subacute infarction (mixed age) are present in the right parietal lobe and the right occipitotemporal junction. No acute hemorrhage. No significant mass effect. 2. Normal MRV.  No evidence of dural venous sinus thrombosis. These results were called by telephone at the time of interpretation on 10/27/2017 at 10:45 pm to Dr. Maryan Rued, who verbally acknowledged these results. Electronically Signed   By: Kristine Garbe M.D.   On: 10/27/2017 22:50    Scheduled Meds: . aspirin  300 mg Rectal Daily   Or  . aspirin  325 mg Oral Daily  . clopidogrel  75 mg Oral Daily  . fesoterodine  8 mg Oral Daily    . levothyroxine  125 mcg Oral QAC breakfast  .  nicotine  21 mg Transdermal Daily  . pantoprazole  40 mg Oral Daily  . [START ON 10/29/2017] pneumococcal 23 valent vaccine  0.5 mL Intramuscular Tomorrow-1000    Continuous Infusions: . sodium chloride 50 mL/hr at 10/28/17 0237     LOS: 1 day     Kayleen Memos, MD [facility] Pager [phone]  If 7PM-7AM, please contact night-coverage [email or web address] Password [email or web address] 10/28/2017, 11:17 AM

## 2017-10-28 NOTE — ED Notes (Signed)
Attempted to call report, not aware of placement

## 2017-10-28 NOTE — Progress Notes (Signed)
Occupational Therapy Evaluation Patient Details Name: Angela Abbott MRN: [id number] DOB: Oct 06, 1960 Today's Date: 10/28/2017    History of Present Illness Pt is a 57 y.o. woman admitted to hospital for intermittent left-sided numbness and weakness. MRI of brain showed scattered embolic-looking strokes in the right parietal lobe and temporal-occipital junction.    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 Pt does not appears to demonstrate any functional deficits and is independent with ADL and mobility. Pt states she sees "floaters"- has been going on for several months. Recommend pt call her eye doctor and schedule an appointment. Educated pt on smoking cessation and activity modification to reduce risk of CVA. Written information provided. Educated on warning signs/symptoms using Befast. Pt very appreciative. OT signing off.     Follow Up Recommendations  No OT follow up    Equipment Recommendations  None recommended by OT    Recommendations for Other Services       Precautions / Restrictions Precautions Precautions: None Restrictions Weight Bearing Restrictions: No      Mobility Bed Mobility Overal bed mobility: Independent             General bed mobility comments: HOB elevated, did not assess with flattened HOB. Pt able to go supine to sit without physical assistance or cueing.   Transfers Overall transfer level: Independent Equipment used: None Transfers: Sit to/from Stand Sit to Stand: Independent         General transfer comment: Pt performed sit to stand x3 without physical assistance or cueing    Balance Overall balance assessment: Independent                                   ADL either performed or assessed with clinical judgement   ADL Overall ADL's : At baseline                                             Vision  Baseline Vision/History: Wears glasses Wears Glasses: Reading only Vision Assessment?: Yes Eye Alignment: Within Functional Limits Ocular Range of Motion: Within Functional Limits Alignment/Gaze Preference: Within Defined Limits Tracking/Visual Pursuits: Able to track stimulus in all quads without difficulty Saccades: Within functional limits Convergence: Within functional limits Visual Fields: No apparent deficits Additional Comments: No errors with cancellation and scanning tasks; no deficits with reading or impaired reading time     Perception  Appears WFL   Praxis  Appears [facility]    Pertinent Vitals/Pain Pain Assessment: No/denies pain     Hand Dominance Right   Extremity/Trunk Assessment Upper Extremity Assessment Upper Extremity Assessment: Overall WFL for tasks assessed   Lower Extremity Assessment Lower Extremity Assessment: Defer to PT evaluation   Cervical / Trunk Assessment Cervical / Trunk Assessment: Normal   Communication Communication Communication: No difficulties   Cognition Arousal/Alertness: Awake/alert Behavior During Therapy: WFL for tasks assessed/performed Overall Cognitive Status: Within Functional Limits for tasks assessed                                     General Comments  Pt educated on warning signs/symtoms of CVA using BeFast. Also educated  pt on activity modification regarding smoking diet and exercise. Writent information provided. Pt very appreciative.     Exercises     Shoulder Instructions      Home Living Family/patient expects to be discharged to:: Private residence Living Arrangements: Other relatives Available Help at Discharge: Family Type of Home: House Home Access: Stairs to enter Technical brewer of Steps: 3 Entrance Stairs-Rails: Can reach both Home Layout: Two level     Bathroom Shower/Tub: International aid/development worker Accessibility: Yes   Home Equipment: None   Additional Comments: Pt  able to live on first floor with access to bathroom  Lives With: Family    Prior Functioning/Environment Level of Independence: Independent        Comments: 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         OT Problem List:        OT Treatment/Interventions:      OT Goals(Current goals can be found in the care plan section) Acute Rehab OT Goals Patient Stated Goal: To go home OT Goal Formulation: All assessment and education complete, DC therapy  OT Frequency:     Barriers to D/C:            Co-evaluation              AM-PAC PT "6 Clicks" Daily Activity     Outcome Measure Help from another person eating meals?: None Help from another person taking care of personal grooming?: None Help from another person toileting, which includes using toliet, bedpan, or urinal?: None Help from another person bathing (including washing, rinsing, drying)?: None Help from another person to put on and taking off regular upper body clothing?: None Help from another person to put on and taking off regular lower body clothing?: None 6 Click Score: 24   End of Session Nurse Communication: Mobility status  Activity Tolerance: Patient tolerated treatment well Patient left: in chair;with call bell/phone within reach  OT Visit Diagnosis: Muscle weakness (generalized) (M62.81)                Time: 6060-0459 OT Time Calculation (min): 31 min Charges:  OT General Charges $OT Visit: 1 Visit OT Evaluation $OT Eval Moderate Complexity: 1 Mod OT Treatments $Self Care/Home Management : 8-22 mins  Maurie Boettcher, OT/L   Acute OT Clinical Specialist Acute Rehabilitation Services Pager [phone] Office [phone]   [facility] 10/28/2017, 3:21 PM

## 2017-10-28 NOTE — Progress Notes (Signed)
  Echocardiogram 2D Echocardiogram has been performed.  Angela Abbott 10/28/2017, 11:24 AM

## 2017-10-28 NOTE — Progress Notes (Addendum)
STROKE TEAM PROGRESS NOTE   INTERVAL HISTORY No family is at the bedside.  She is lying in bed. Feels her L sided sensory deficit has resolved but still with mild weakness. Did well with therapy. R ICA stenosis likely the culprit of her strokes. Carotid Doppler has confirmed stensosis that is greater than that seen on CTA. Dr. Nevada Crane notified for recommendation of VVS consult. Pt agreeable. Ok for surgery this admission if able from stroke standpoint.  Vitals:   10/28/17 0131 10/28/17 0239 10/28/17 0434 10/28/17 0818  BP: (!) 164/67 119/76 (!) 144/64 (!) 141/93  Pulse: 60 68 60 68  Resp: 18 16  16   Temp: 97.7 F (36.5 C) 97.8 F (36.6 C) 97.9 F (36.6 C) 97.7 F (36.5 C)  TempSrc: Oral Oral Oral Oral  SpO2: 97% 98% 99% 96%  Weight:      Height:        CBC:  Recent Labs  Lab 10/27/17 1638 10/27/17 1659  WBC 8.6  --   NEUTROABS 5.1  --   HGB 13.1 13.6  HCT 42.5 40.0  MCV 86.2  --   PLT 267  --     Basic Metabolic Panel:  Recent Labs  Lab 10/27/17 1638 10/27/17 1659  NA 139 139  K 4.1 4.8  CL 105 106  CO2 26  --   GLUCOSE 92 91  BUN 12 21*  CREATININE 0.95 0.90  CALCIUM 9.2  --    Lipid Panel:     Component Value Date/Time   CHOL 44 10/28/2017 0437   TRIG 32 10/28/2017 0437   HDL 18 (L) 10/28/2017 0437   CHOLHDL 2.4 10/28/2017 0437   VLDL 6 10/28/2017 0437   LDLCALC 20 10/28/2017 0437   HgbA1c: No results found for: HGBA1C Urine Drug Screen:     Component Value Date/Time   LABOPIA NONE DETECTED 10/28/2017 0657   COCAINSCRNUR NONE DETECTED 10/28/2017 0657   LABBENZ NONE DETECTED 10/28/2017 0657   AMPHETMU NONE DETECTED 10/28/2017 [place] DETECTED 10/28/2017 0657   LABBARB NONE DETECTED 10/28/2017 0657    Alcohol Level No results found for: ETH  IMAGING Ct Angio Head W Or Wo Contrast  Result Date: 10/28/2017 CLINICAL DATA:  Left hand and face intermittent numbness EXAM: CT ANGIOGRAPHY HEAD AND NECK TECHNIQUE: Multidetector CT imaging of the  head and neck was performed using the standard protocol during bolus administration of intravenous contrast. Multiplanar CT image reconstructions and MIPs were obtained to evaluate the vascular anatomy. Carotid stenosis measurements (when applicable) are obtained utilizing NASCET criteria, using the distal internal carotid diameter as the denominator. CONTRAST:  14mL ISOVUE-370 IOPAMIDOL (ISOVUE-370) INJECTION 76% COMPARISON:  None. FINDINGS: CTA NECK FINDINGS AORTIC ARCH: There is no calcific atherosclerosis of the aortic arch. There is no aneurysm, dissection or hemodynamically significant stenosis of the visualized ascending aorta and aortic arch. Conventional 3 vessel aortic branching pattern. The visualized proximal subclavian arteries are widely patent. RIGHT CAROTID SYSTEM: --Common carotid artery: Widely patent origin without common carotid artery dissection or aneurysm. --Internal carotid artery: No dissection, occlusion or aneurysm. Mixed calcified and non-calcified atherosclerotic disease at the bifurcation, extending into the internal carotid artery, resulting in 50% stenosis. --External carotid artery: No acute abnormality. LEFT CAROTID SYSTEM: --Common carotid artery: Widely patent origin without common carotid artery dissection or aneurysm. --Internal carotid artery:No dissection, occlusion or aneurysm. Mixed calcified and non-calcified atherosclerotic disease at the bifurcation, extending into the internal carotid artery, resulting in 20% stenosis. --External carotid  artery: No acute abnormality. VERTEBRAL ARTERIES: Right dominant configuration. Both origins are normal. No dissection, occlusion or flow-limiting stenosis to the vertebrobasilar confluence. SKELETON: There is no bony spinal canal stenosis. No lytic or blastic lesion. OTHER NECK: Normal pharynx, larynx and major salivary glands. No cervical lymphadenopathy. Unremarkable thyroid gland. UPPER CHEST: No pneumothorax or pleural effusion. No  nodules or masses. CTA HEAD FINDINGS ANTERIOR CIRCULATION: --Intracranial internal carotid arteries: Normal. --Anterior cerebral arteries: Normal. Both A1 segments are present. Patent anterior communicating artery. --Middle cerebral arteries: Normal. --Posterior communicating arteries: Absent bilaterally. POSTERIOR CIRCULATION: --Basilar artery: Normal. --Posterior cerebral arteries: Short segment severe stenosis of the right PCA P2 segment. There is occlusion of the left PCA P2 segment. --Superior cerebellar arteries: Normal. --Inferior cerebellar arteries: Normal anterior and posterior inferior cerebellar arteries. VENOUS SINUSES: As permitted by contrast timing, patent. ANATOMIC VARIANTS: None DELAYED PHASE: No parenchymal contrast enhancement. Review of the MIP images confirms the above findings. IMPRESSION: 1. Occlusion of the midportion of the left PCA P2 segment and severe stenosis of the proximal right PCA P2 segment. 2. No other occlusion or hemodynamically significant stenosis of the intracranial arteries. 3. 50% stenosis of the proximal right ICA. 20% stenosis of the proximal left ICA. Electronically Signed   By: Ulyses Jarred M.D.   On: 10/28/2017 03:33   Dg Chest 2 View  Result Date: 10/27/2017 CLINICAL DATA:  CVA EXAM: CHEST - 2 VIEW COMPARISON:  03/14/2017 FINDINGS: The heart size and mediastinal contours are within normal limits. Both lungs are clear. Mild widening of the right [facility] joint with possible surgical changes at the distal right clavicle. IMPRESSION: No active cardiopulmonary disease. Electronically Signed   By: Donavan Foil M.D.   On: 10/27/2017 23:45   Ct Head Wo Contrast  Result Date: 10/27/2017 CLINICAL DATA:  Intermittent LEFT face and LEFT hand numbness for 2 days. EXAM: CT HEAD WITHOUT CONTRAST TECHNIQUE: Contiguous axial images were obtained from the base of the skull through the vertex without intravenous contrast. COMPARISON:  CT HEAD June 27, 2017 FINDINGS: BRAIN: No  intraparenchymal hemorrhage, mass effect nor midline shift. The ventricles and sulci are normal. No acute large vascular territory infarcts. Subcentimeter subtle RIGHT parietal hypodensity may be extra-axial (axial image 25). Basal cisterns are patent. VASCULAR: Trace calcific atherosclerosis carotid siphon. Mildly dense RIGHT cortical vein (likely [place]). SKULL/SOFT TISSUES: No skull fracture. No significant soft tissue swelling. ORBITS/SINUSES: The included ocular globes and orbital contents are normal.Trace paranasal sinus mucosal thickening. Mastoid air cells are well aerated. OTHER: None. IMPRESSION: Small RIGHT parietal RIGHT parietal subtle hypodensity may be extra-axial. Given adjacent dense cortical vein, venous infarct is possible versus small mass. Recommend MRI of the head with and without contrast and contrast enhanced MRV. Electronically Signed   By: Elon Alas M.D.   On: 10/27/2017 19:08   Ct Angio Neck W Or Wo Contrast  Result Date: 10/28/2017 CLINICAL DATA:  Left hand and face intermittent numbness EXAM: CT ANGIOGRAPHY HEAD AND NECK TECHNIQUE: Multidetector CT imaging of the head and neck was performed using the standard protocol during bolus administration of intravenous contrast. Multiplanar CT image reconstructions and MIPs were obtained to evaluate the vascular anatomy. Carotid stenosis measurements (when applicable) are obtained utilizing NASCET criteria, using the distal internal carotid diameter as the denominator. CONTRAST:  57mL ISOVUE-370 IOPAMIDOL (ISOVUE-370) INJECTION 76% COMPARISON:  None. FINDINGS: CTA NECK FINDINGS AORTIC ARCH: There is no calcific atherosclerosis of the aortic arch. There is no aneurysm, dissection or hemodynamically significant stenosis of  the visualized ascending aorta and aortic arch. Conventional 3 vessel aortic branching pattern. The visualized proximal subclavian arteries are widely patent. RIGHT CAROTID SYSTEM: --Common carotid artery: Widely  patent origin without common carotid artery dissection or aneurysm. --Internal carotid artery: No dissection, occlusion or aneurysm. Mixed calcified and non-calcified atherosclerotic disease at the bifurcation, extending into the internal carotid artery, resulting in 50% stenosis. --External carotid artery: No acute abnormality. LEFT CAROTID SYSTEM: --Common carotid artery: Widely patent origin without common carotid artery dissection or aneurysm. --Internal carotid artery:No dissection, occlusion or aneurysm. Mixed calcified and non-calcified atherosclerotic disease at the bifurcation, extending into the internal carotid artery, resulting in 20% stenosis. --External carotid artery: No acute abnormality. VERTEBRAL ARTERIES: Right dominant configuration. Both origins are normal. No dissection, occlusion or flow-limiting stenosis to the vertebrobasilar confluence. SKELETON: There is no bony spinal canal stenosis. No lytic or blastic lesion. OTHER NECK: Normal pharynx, larynx and major salivary glands. No cervical lymphadenopathy. Unremarkable thyroid gland. UPPER CHEST: No pneumothorax or pleural effusion. No nodules or masses. CTA HEAD FINDINGS ANTERIOR CIRCULATION: --Intracranial internal carotid arteries: Normal. --Anterior cerebral arteries: Normal. Both A1 segments are present. Patent anterior communicating artery. --Middle cerebral arteries: Normal. --Posterior communicating arteries: Absent bilaterally. POSTERIOR CIRCULATION: --Basilar artery: Normal. --Posterior cerebral arteries: Short segment severe stenosis of the right PCA P2 segment. There is occlusion of the left PCA P2 segment. --Superior cerebellar arteries: Normal. --Inferior cerebellar arteries: Normal anterior and posterior inferior cerebellar arteries. VENOUS SINUSES: As permitted by contrast timing, patent. ANATOMIC VARIANTS: None DELAYED PHASE: No parenchymal contrast enhancement. Review of the MIP images confirms the above findings. IMPRESSION:  1. Occlusion of the midportion of the left PCA P2 segment and severe stenosis of the proximal right PCA P2 segment. 2. No other occlusion or hemodynamically significant stenosis of the intracranial arteries. 3. 50% stenosis of the proximal right ICA. 20% stenosis of the proximal left ICA. Electronically Signed   By: Ulyses Jarred M.D.   On: 10/28/2017 03:33   Mr Jeri Cos And Wo Contrast  Result Date: 10/27/2017 CLINICAL DATA:  57 y/o F; numbness in the left hand and left face intermittent since Saturday. Symptoms worse today. EXAM: MRI HEAD WITHOUT AND WITH CONTRAST MRV HEAD WITHOUT CONTRAST TECHNIQUE: Multiplanar, multiecho pulse sequences of the brain and surrounding structures were obtained without with intravenous contrast. Angiographic images of the intracranial venous structures were obtained using MRV technique without intravenous contrast. COMPARISON:  10/27/2017 CT head CONTRAST:  10 cc Gadavist FINDINGS: MR HEAD FINDINGS Brain: There several scattered foci of reduced diffusion in the right parietal lobe in the right occipitotemporal junction. Some of the lesions have variable diffusion, for example the right occipitotemporal lesion is hypointense on ADC (series 350, image 22) and the right postcentral gyrus lesion is mildly hypointense/near isointense on ADC (series 305, image 47). Additionally, some of the lesions enhance. Findings are consistent with mixed age acute and subacute infarctions. No focal mass effect, extra-axial collection, hydrocephalus, or herniation. A few punctate foci of susceptibility hypointensity are present in the right parietal lobe, discrete from areas of ischemia, compatible hemosiderin deposition of chronic microhemorrhage. Vascular: Normal flow voids. Skull and upper cervical spine: Normal marrow signal. Sinuses/Orbits: Mild mucosal thickening of the ethmoid and sphenoid sinuses. No abnormal signal of the mastoid air cells. Orbits are unremarkable. Other: None. MRV HEAD  FINDINGS Normal flow related signal within the superior sagittal sinus, internal cerebral veins, basal veins of Rosenthal, straight sinus, bilateral transverse sinus, bilateral sigmoid sinus, and bilateral upper  internal jugular veins. Additionally, there is normal flow related signal within the large cortical veins. No evidence of dural venous sinus thrombosis. IMPRESSION: 1. Several small scattered foci of acute and subacute infarction (mixed age) are present in the right parietal lobe and the right occipitotemporal junction. No acute hemorrhage. No significant mass effect. 2. Normal MRV.  No evidence of dural venous sinus thrombosis. These results were called by telephone at the time of interpretation on 10/27/2017 at 10:45 pm to Dr. Maryan Rued, who verbally acknowledged these results. Electronically Signed   By: Kristine Garbe M.D.   On: 10/27/2017 22:50   Mr Mrv Head Wo Cm  Result Date: 10/27/2017 CLINICAL DATA:  57 y/o F; numbness in the left hand and left face intermittent since Saturday. Symptoms worse today. EXAM: MRI HEAD WITHOUT AND WITH CONTRAST MRV HEAD WITHOUT CONTRAST TECHNIQUE: Multiplanar, multiecho pulse sequences of the brain and surrounding structures were obtained without with intravenous contrast. Angiographic images of the intracranial venous structures were obtained using MRV technique without intravenous contrast. COMPARISON:  10/27/2017 CT head CONTRAST:  10 cc Gadavist FINDINGS: MR HEAD FINDINGS Brain: There several scattered foci of reduced diffusion in the right parietal lobe in the right occipitotemporal junction. Some of the lesions have variable diffusion, for example the right occipitotemporal lesion is hypointense on ADC (series 350, image 22) and the right postcentral gyrus lesion is mildly hypointense/near isointense on ADC (series 305, image 47). Additionally, some of the lesions enhance. Findings are consistent with mixed age acute and subacute infarctions. No focal  mass effect, extra-axial collection, hydrocephalus, or herniation. A few punctate foci of susceptibility hypointensity are present in the right parietal lobe, discrete from areas of ischemia, compatible hemosiderin deposition of chronic microhemorrhage. Vascular: Normal flow voids. Skull and upper cervical spine: Normal marrow signal. Sinuses/Orbits: Mild mucosal thickening of the ethmoid and sphenoid sinuses. No abnormal signal of the mastoid air cells. Orbits are unremarkable. Other: None. MRV HEAD FINDINGS Normal flow related signal within the superior sagittal sinus, internal cerebral veins, basal veins of Rosenthal, straight sinus, bilateral transverse sinus, bilateral sigmoid sinus, and bilateral upper internal jugular veins. Additionally, there is normal flow related signal within the large cortical veins. No evidence of dural venous sinus thrombosis. IMPRESSION: 1. Several small scattered foci of acute and subacute infarction (mixed age) are present in the right parietal lobe and the right occipitotemporal junction. No acute hemorrhage. No significant mass effect. 2. Normal MRV.  No evidence of dural venous sinus thrombosis. These results were called by telephone at the time of interpretation on 10/27/2017 at 10:45 pm to Dr. Maryan Rued, who verbally acknowledged these results. Electronically Signed   By: Kristine Garbe M.D.   On: 10/27/2017 22:50   Carotid Doppler   80-99% right internal carotid artery stenosis and 40-59% left internal carotid artery stenosis. Vertebral arteries are patent with antegrade flow.  2D Echocardiogram  - Left ventricle: The cavity size was normal. There was mild concentric hypertrophy. Systolic function was normal. The estimated ejection fraction was in the range of 60% to 65%. Wall motion was normal; there were no regional wall motion abnormalities. Doppler parameters are consistent with abnormal left ventricular relaxation (grade 1 diastolic dysfunction). - Left  atrium: The atrium was mildly dilated.   PHYSICAL EXAM GENERAL: Awake, alert in NAD. Well developed, pleasant overweight female HEENT: - Normocephalic and atraumatic LUNGS - Clear to auscultation bilaterally with no wheezes CV - regular rate and ahythm Ext: warm, well perfused, intact peripheral pulses, no edema  NEURO:  Mental Status: AA&Ox3  Language: speech is clear naming, repetition, fluency, and comprehension intact. Cranial Nerves: PERRL. EOMI, visual fields full, no facial asymmetry, facial sensation intact, hearing intact, tongue/uvula/soft palate midline, normal sternocleidomastoid and trapezius muscle strength. No evidence of tongue atrophy or fibrillations Motor: Grossly 5/5 right upper and right lower extremity.  Subtle weakness in the left upper extremity with pronator drift but no vertical drift 4+/5.  Left lower extremity 5/5. Right arm orbits the L Tone: is normal and bulk is normal Sensation- equal bilaterally to touch and temperature Coordination: FTN intact bilaterally, no ataxia in BLE. Gait- steady  ASSESSMENT/PLAN [name] is a 57 y.o. female with history of HTN, GERD, tobacco use and etoh use presenting with L sided numbness and weakness.   Stroke:  Small right parietal and occipital infarcts, thromboembolic secondary to R ICA stenosis / large vessel disease source. Likely surgical candidate during this admission  CT head sm R parietal hypodensity  MRI  Several small acute and subacte R parietal and R occipitotemporal junction infarcts  MRV  normal  CTA head & neck occluded L P2 and severe stenosis R P2. R ICA 50% stenosis, L ICA 20%.  Carotid Doppler  R 80-99% stenosis; L 40-59% stenosis  2D Echo  EF 60-65%. No source of embolus   LDL 20  HgbA1c pending   SCDs for VTE prophylaxis  No antithrombotic prior to admission, now on aspirin 325 mg daily and clopidogrel 75 mg daily.   Therapy recommendations:  No SLP. Did well with PT per pt.  Note pending   Disposition:  pending   Carotid stenosis  Carotid Doppler  R 80-99% stenosis; L 40-59% stenosis  VVS consult recommended. Dr. Nevada Crane notified  [facility] for surgery this admission from stroke standpoint  Hypertension  Elevated, as high as 195/120  Stable this am . Permissive hypertension (OK if < 220/120) but gradually normalize in 5-7 days . Long-term BP goal normotensive  Other Stroke Risk Factors  Cigarette smoker, advised to stop smoking  ETOH use, advised to drink no more than 1 drink(s) a day  Obesity, Body mass index is 39.73 kg/m., recommend weight loss, diet and exercise as appropriate   Hx lap band surgery in 2010 with band removed this year with subsequent weight gain  Family hx stroke (other relation)  Pt on estrace. Consider discontinuation given stroke risk  Other Active Problems  Hypothyroidism  GERD  Hospital day # 1  Burnetta Sabin, MSN, APRN, ANVP-BC, AGPCNP-BC Advanced Practice Stroke Nurse [place] for Schedule & Pager information 10/28/2017 9:09 AM   ATTENDING NOTE: I reviewed above note and agree with the assessment and plan. Pt was seen and examined.   57 year old female with history of hypothyroidism, current smoker admitted for on and off left-sided face and hand numbness and weakness.  CT concerning for right parietal small infarct.  MRI showed right MCA/ACA and MCA/PCA scattered infarcts.  MRV negative.  CT head and neck showed left P2 occlusion right P2 severe stenosis, right ICA at least 60% stenosis in the left ICA atherosclerosis.  Carotid Doppler showed right ICA 80 to 99% stenosis, and left ICA 40 to 59% stenosis.  EF 60 to 65%.  LDL 20 and A1c pending.  UDS negative.  Patient has symptomatic right ICA high-grade stenosis, recommend vascular surgery consultation to consider right CEA within 2 weeks.  Patient was educated on quit smoking.  Currently on aspirin Plavix and low-dose Lipitor.  Recommend  aspirin Plavix for 3 weeks and then aspirin alone.  Continue Lipitor on discharge  Patient was on 1 mg estrogen daily for postmenopausal management.  Recommend patient discuss with her OB/GYN regarding further estrogen management.  Patient expressed understanding.  Neurology will sign off. Please call with questions. Pt will follow up with stroke clinic NP at [facility] in about 4 weeks. Thanks for the consult.   Rosalin Hawking, MD PhD Stroke Neurology 10/28/2017 6:36 PM     To contact Stroke Continuity provider, please refer to [email or web address] After hours, contact General Neurology

## 2017-10-28 NOTE — Progress Notes (Signed)
*  PRELIMINARY RESULTS* Vascular Ultrasound Carotid Duplex (Doppler) has been completed.  Findings suggest 80-99% right internal carotid artery stenosis and 40-59% left internal carotid artery stenosis. Vertebral arteries are patent with antegrade flow.  Preliminary results discussed with Dr. Nevada Crane.  10/28/2017 12:50 PM Maudry Mayhew, MHA, RVT, RDCS, RDMS

## 2017-10-29 ENCOUNTER — Other Ambulatory Visit: Payer: Self-pay | Admitting: *Deleted

## 2017-10-29 LAB — HEMOGLOBIN A1C
HEMOGLOBIN A1C: 5.9 % — AB (ref 4.8–5.6)
Mean Plasma Glucose: 123 mg/dL

## 2017-10-29 MED ORDER — CLOPIDOGREL BISULFATE 75 MG PO TABS: 75.0000 mg | ORAL_TABLET | Freq: Every day | ORAL | 1 refills | Status: DC

## 2017-10-29 MED ORDER — CLOPIDOGREL BISULFATE 75 MG PO TABS: 75.0000 mg | ORAL_TABLET | Freq: Every day | ORAL | 0 refills | Status: DC

## 2017-10-29 MED ORDER — ATORVASTATIN CALCIUM 20 MG PO TABS: 20.0000 mg | ORAL_TABLET | Freq: Every day | ORAL | 1 refills | Status: DC

## 2017-10-29 MED ORDER — ASPIRIN 325 MG PO TABS: 325.0000 mg | ORAL_TABLET | Freq: Every day | ORAL | 0 refills | Status: DC

## 2017-10-29 MED ORDER — SENNOSIDES-DOCUSATE SODIUM 8.6-50 MG PO TABS: 1.0000 | ORAL_TABLET | Freq: Every evening | ORAL | 0 refills | Status: DC | PRN

## 2017-10-29 NOTE — Progress Notes (Signed)
NURSING PROGRESS NOTE  Angela Abbott [id number] Discharge Data: 10/29/2017 11:52 AM Attending Provider: Hosie Poisson, MD PIR:JJOACZY, Jenny Reichmann, MD     Estrellita Ludwig to be D/C'd Home per MD order.  Discussed with the patient the After Visit Summary and all questions fully answered. All IV's discontinued with no bleeding noted. All belongings returned to patient for patient to take home.   Last Vital Signs:  Blood pressure (!) 145/73, pulse 67, temperature 97.8 F (36.6 C), temperature source Oral, resp. rate (!) 22, height 5' 6.25" (1.683 m), weight 112.5 kg, SpO2 94 %.  Discharge Medication List Allergies as of 10/29/2017      Reactions   Bee Venom Shortness Of Breath      Medication List    STOP taking these medications   oxyCODONE 5 MG immediate release tablet Commonly known as:  Oxy IR/ROXICODONE     TAKE these medications   aspirin 325 MG tablet Take 1 tablet (325 mg total) by mouth daily.   atorvastatin 20 MG tablet Commonly known as:  LIPITOR Take 1 tablet (20 mg total) by mouth daily at 6 PM.   clopidogrel 75 MG tablet Commonly known as:  PLAVIX Take 1 tablet (75 mg total) by mouth daily. Use plavix for 3 weeks, followed by aspirin alone.   estradiol 1 MG tablet Commonly known as:  ESTRACE Take 1 mg by mouth daily.   levothyroxine 125 MCG tablet Commonly known as:  SYNTHROID, LEVOTHROID Take 125 mcg by mouth daily before breakfast.   omeprazole 40 MG capsule Commonly known as:  PRILOSEC Take 1 capsule (40 mg total) by mouth daily.   senna-docusate 8.6-50 MG tablet Commonly known as:  Senokot-S Take 1 tablet by mouth at bedtime as needed for mild constipation.   tolterodine 4 MG 24 hr capsule Commonly known as:  DETROL LA Take 4 mg by mouth daily.

## 2017-10-29 NOTE — Care Management Note (Signed)
Case Management Note  Patient Details  Name: Angela Abbott MRN: [id number] Date of Birth: Apr 13, 1960  Subjective/Objective:    Pt admitted with CVA. She is from home with her niece. IADL.                Action/Plan: Pt discharging home with self care. No f/u per PT/OT and no DME needs. Pt to return for CEA at later time.  Pt has transportation home.   Expected Discharge Date:  10/29/17               Expected Discharge Plan:  Home/Self Care  In-House Referral:     Discharge planning Services     Post Acute Care Choice:    Choice offered to:     DME Arranged:    DME Agency:     HH Arranged:    HH Agency:     Status of Service:  Completed, signed off  If discussed at [employer] of Stay Meetings, dates discussed:    Additional Comments:  Pollie Friar, RN 10/29/2017, 10:29 AM

## 2017-10-30 ENCOUNTER — Telehealth: Payer: Self-pay | Admitting: *Deleted

## 2017-10-30 DIAGNOSIS — N951 Menopausal and female climacteric states: Secondary | ICD-10-CM | POA: Diagnosis not present

## 2017-10-30 NOTE — Pre-Procedure Instructions (Signed)
Angela Abbott  10/30/2017      [facility] [place], [place] [address] [place] [address] Phone: [phone] Fax: [phone]    Your procedure is scheduled on 11-05-2017  Wednesday   Report to [facility] Admitting at 7:30 A.M.   Call this number if you have problems the morning of surgery:  [phone]   Remember:  Do not eat food or drink liquids after midnight.                         Take these medicines the morning of surgery with A SIP OF WATER   Tylenol if needed Aspirin  Plavix Levothyroxine (synthroid) Omeprazole(Prilosec) Detrol LA   STOP TAKING ANY ASPIRIN (UNLESS OTHERWISE INSTRUCTED BY YOUR SURGEON),ANTIINFLAMATORIES (IBUPROFEN,ALEVE,MOTRIN,ADVIL,GOODY'S POWDERS),HERBAL SUPPLEMENTS,FISH OIL,AND VITAMINS 5-7 DAYS PRIOR TO SURGERY   Continue Aspirin and Plavix     Do not wear jewelry, make-up or nail polish.  Do not wear lotions, powders, or perfumes, or deodorant.  Do not shave 48 hours prior to surgery.   .  Do not bring valuables to the hospital.   [facility] is not responsible for any belongings or valuables.  Contacts, dentures or bridgework may not be worn into surgery.  Leave your suitcase in the car.  After surgery it may be brought to your room.  For patients admitted to the hospital, discharge time will be determined by your treatment team.  Patients discharged the day of surgery will not be allowed to drive home.     [facility] - Preparing for Surgery  Before surgery, you can play an important role.  Because skin is not sterile, your skin needs to be as free of germs as possible.  You can reduce the number of germs on you skin by washing with CHG (chlorahexidine gluconate) soap before surgery.  CHG is an antiseptic cleaner which kills germs and bonds with the skin to continue killing germs even after washing.  Oral Hygiene is also important in reducing the risk of  infection.  Remember to brush your teeth with your regular toothpaste the morning of surgery.  Please DO NOT use if you have an allergy to CHG or antibacterial soaps.  If your skin becomes reddened/irritated stop using the CHG and inform your nurse when you arrive at Short Stay.  Do not shave (including legs and underarms) for at least 48 hours prior to the first CHG shower.  You may shave your face.  Please follow these instructions carefully:   1.  Shower with CHG Soap the night before surgery and the morning of Surgery.  2.  If you choose to wash your hair, wash your hair first as usual with your normal shampoo.  3.  After you shampoo, rinse your hair and body thoroughly to remove the shampoo. 4.  Use CHG as you would any other liquid soap.  You can apply chg directly to the skin and wash gently with a      scrungie or washcloth.           5.  Apply the CHG Soap to your body ONLY FROM THE NECK DOWN.   Do not use on open wounds or open sores. Avoid contact with your eyes, ears, mouth and genitals (private parts).  Wash genitals (private parts) with your normal soap.  6.  Wash thoroughly, paying special attention to the area where your surgery will be performed.  7.  Thoroughly rinse your body with warm water from the neck down.  8.  DO NOT shower/wash with your normal soap after using and rinsing off the CHG Soap.  9.  Pat yourself dry with a clean towel.            10.  Wear clean pajamas.            11.  Place clean sheets on your bed the night of your first shower and do not sleep with pets.  Day of Surgery  Do not apply any lotions/deoderants the morning of surgery.   Please wear clean clothes to the hospital/surgery center. Remember to brush your teeth with toothpaste.    Please read over the following fact sheets that you were given. Pain Booklet and Surgical Site Infection Prevention

## 2017-10-30 NOTE — Telephone Encounter (Signed)
Call to patient instructed to be at [facility] admitting department at 7:30 am on 11/05/2017 for surgery. NPO past MN night prior. Follow the detailed instruction received from the hospital pre-admission department for this surgery. Continue ASA and Plavix. Plan for overnight stay. Verbalized understanding and to call this office if questions.

## 2017-10-30 NOTE — Discharge Summary (Signed)
Physician Discharge Summary  Angela Abbott IDP:[id number] DOB: 1960/08/19 DOA: 10/27/2017  PCP: Sharilyn Sites, MD  Admit date: 10/27/2017 Discharge date: 10/29/2017  Admitted From: Home Disposition: Home  Recommendations for Outpatient Follow-up:  1. Follow up with PCP in 1-2 weeks 2. Please obtain BMP/CBC in one week Please follow up with vascular surgery as recommended.  Please follow up with neurology as recommended.    Discharge Condition:stable.  CODE STATUS:full code.  Diet recommendation: Heart Healthy  Brief/Interim Summary: Angela Abbott a 57 y.o.femalewith medical history significant of tobacco abuse on estrogen hormone,hypothyroidism, and obesity who presented to the ED withleft-sided numbness of her face and arm. For 1 week she has been having intermittent tingling of her left hand.  Then developed heaviness and numbness in her left arm and decided to be seen.  So she presented to the ED.  CT head done in the ED revealed hypodensity in the right parietal area with concern for venous infarct.  MRI brain revealed several small scattered foci of acute and subacute infarction in the right parietal lobe and in the right occipital temporal junction.  Discharge Diagnoses:  Active Problems:   Hypothyroidism   GERD (gastroesophageal reflux disease)   CVA (cerebral vascular accident) ([place])   Tobacco abuse   Acute ischemic right parietal and right occipital temporal junction CVA Independently reviewed MRI done on 10/27/2017 which revealed several small scattered foci of acute and subacute infarction in the right parietal lobe in the right occipitotemporal junction No acute hemorrhage noted. Risk factors include estrogen use in the setting of 40+ tobacco use Continue to hold off estrogen Therapy evals no need to follow up.  Speech therapy assessed and no further recommendations 2D echo completed and results show The cavity size was normal. There was mild    concentric hypertrophy. Systolic function was normal. The   estimated ejection fraction was in the range of 60% to 65%. Wall   motion was normal; there were no regional wall motion   abnormalities. Doppler parameters are consistent with abnormal   left ventricular relaxation (grade 1 diastolic dysfunction). - Left atrium: The atrium was mildly dilated. Continue to monitor heart rhythm on telemetry Independently reviewed twelve-lead EKG done on 10/27/2017 which revealed sinus rhythm with rate of 82 LDL 20 Started on aspirin and Plavix   Severe right ICA stenosis 80 to 99% Vascular surgery consulted Dr. Trula Slade Recommended surgery next week as outpatient.   Tobacco use disorder in the setting of estrogen hormone use Follow-up with your gynecologist to resume use of estrogen if continues to smoke Tobacco counseling cessation at bedside  Hypothyroidism Continue levothyroxine  GERD Continue Protonix  Obesity BMI 39 Recommend weight loss outpatient with regular physical activity and healthy dieting  Hypertension Not on antihypertensive medications at home Allow for permissive hypertension      Discharge Instructions  Discharge Instructions    Ambulatory referral to Neurology   Complete by:  As directed    Follow up with stroke clinic NP (Jessica Vanschaick or Cecille Rubin, if both not available, consider Zachery Dauer, or [place]) at [facility] in about 4 weeks. Thanks.   Diet - low sodium heart healthy   Complete by:  As directed    Discharge instructions   Complete by:  As directed    Please follow up with vascular surgery as recommended.  On discharge, please take plavix and aspirin for 3 weeks, after that aspirin alone.  Please follow up with neurology as recommended.  Allergies as of 10/29/2017      Reactions   Bee Venom Shortness Of Breath, Swelling      Medication List    STOP taking these medications   oxyCODONE 5 MG immediate release  tablet Commonly known as:  Oxy IR/ROXICODONE     TAKE these medications   aspirin 325 MG tablet Take 1 tablet (325 mg total) by mouth daily.   atorvastatin 20 MG tablet Commonly known as:  LIPITOR Take 1 tablet (20 mg total) by mouth daily at 6 PM.   clopidogrel 75 MG tablet Commonly known as:  PLAVIX Take 1 tablet (75 mg total) by mouth daily. Use plavix for 3 weeks, followed by aspirin alone.   levothyroxine 125 MCG tablet Commonly known as:  SYNTHROID, LEVOTHROID Take 125 mcg by mouth daily before breakfast.   omeprazole 40 MG capsule Commonly known as:  PRILOSEC Take 1 capsule (40 mg total) by mouth daily.   senna-docusate 8.6-50 MG tablet Commonly known as:  Senokot-S Take 1 tablet by mouth at bedtime as needed for mild constipation.   tolterodine 4 MG 24 hr capsule Commonly known as:  DETROL LA Take 4 mg by mouth daily.      Follow-up Information    [name]. Schedule an appointment as soon as possible for a visit in 4 week(s).   Specialty:  Neurology Contact information: [address] [place] [phone]       Sharilyn Sites, MD. Schedule an appointment as soon as possible for a visit in 1 week(s).   Specialty:  Family Medicine Contact information: [address] [place] [address] [phone]          Allergies  Allergen Reactions  . Bee Venom Shortness Of Breath and Swelling    Consultations:  Neurology  Vascular surgery.    Procedures/Studies: Ct Angio Head W Or Wo Contrast  Result Date: 10/28/2017 CLINICAL DATA:  Left hand and face intermittent numbness EXAM: CT ANGIOGRAPHY HEAD AND NECK TECHNIQUE: Multidetector CT imaging of the head and neck was performed using the standard protocol during bolus administration of intravenous contrast. Multiplanar CT image reconstructions and MIPs were obtained to evaluate the vascular anatomy. Carotid stenosis measurements (when  applicable) are obtained utilizing NASCET criteria, using the distal internal carotid diameter as the denominator. CONTRAST:  20mL ISOVUE-370 IOPAMIDOL (ISOVUE-370) INJECTION 76% COMPARISON:  None. FINDINGS: CTA NECK FINDINGS AORTIC ARCH: There is no calcific atherosclerosis of the aortic arch. There is no aneurysm, dissection or hemodynamically significant stenosis of the visualized ascending aorta and aortic arch. Conventional 3 vessel aortic branching pattern. The visualized proximal subclavian arteries are widely patent. RIGHT CAROTID SYSTEM: --Common carotid artery: Widely patent origin without common carotid artery dissection or aneurysm. --Internal carotid artery: No dissection, occlusion or aneurysm. Mixed calcified and non-calcified atherosclerotic disease at the bifurcation, extending into the internal carotid artery, resulting in 50% stenosis. --External carotid artery: No acute abnormality. LEFT CAROTID SYSTEM: --Common carotid artery: Widely patent origin without common carotid artery dissection or aneurysm. --Internal carotid artery:No dissection, occlusion or aneurysm. Mixed calcified and non-calcified atherosclerotic disease at the bifurcation, extending into the internal carotid artery, resulting in 20% stenosis. --External carotid artery: No acute abnormality. VERTEBRAL ARTERIES: Right dominant configuration. Both origins are normal. No dissection, occlusion or flow-limiting stenosis to the vertebrobasilar confluence. SKELETON: There is no bony spinal canal stenosis. No lytic or blastic lesion. OTHER NECK: Normal pharynx, larynx and major salivary glands. No cervical lymphadenopathy. Unremarkable thyroid gland.  UPPER CHEST: No pneumothorax or pleural effusion. No nodules or masses. CTA HEAD FINDINGS ANTERIOR CIRCULATION: --Intracranial internal carotid arteries: Normal. --Anterior cerebral arteries: Normal. Both A1 segments are present. Patent anterior communicating artery. --Middle cerebral  arteries: Normal. --Posterior communicating arteries: Absent bilaterally. POSTERIOR CIRCULATION: --Basilar artery: Normal. --Posterior cerebral arteries: Short segment severe stenosis of the right PCA P2 segment. There is occlusion of the left PCA P2 segment. --Superior cerebellar arteries: Normal. --Inferior cerebellar arteries: Normal anterior and posterior inferior cerebellar arteries. VENOUS SINUSES: As permitted by contrast timing, patent. ANATOMIC VARIANTS: None DELAYED PHASE: No parenchymal contrast enhancement. Review of the MIP images confirms the above findings. IMPRESSION: 1. Occlusion of the midportion of the left PCA P2 segment and severe stenosis of the proximal right PCA P2 segment. 2. No other occlusion or hemodynamically significant stenosis of the intracranial arteries. 3. 50% stenosis of the proximal right ICA. 20% stenosis of the proximal left ICA. Electronically Signed   By: Ulyses Jarred M.D.   On: 10/28/2017 03:33   Dg Chest 2 View  Result Date: 10/27/2017 CLINICAL DATA:  CVA EXAM: CHEST - 2 VIEW COMPARISON:  03/14/2017 FINDINGS: The heart size and mediastinal contours are within normal limits. Both lungs are clear. Mild widening of the right [facility] joint with possible surgical changes at the distal right clavicle. IMPRESSION: No active cardiopulmonary disease. Electronically Signed   By: Donavan Foil M.D.   On: 10/27/2017 23:45   Ct Head Wo Contrast  Result Date: 10/27/2017 CLINICAL DATA:  Intermittent LEFT face and LEFT hand numbness for 2 days. EXAM: CT HEAD WITHOUT CONTRAST TECHNIQUE: Contiguous axial images were obtained from the base of the skull through the vertex without intravenous contrast. COMPARISON:  CT HEAD June 27, 2017 FINDINGS: BRAIN: No intraparenchymal hemorrhage, mass effect nor midline shift. The ventricles and sulci are normal. No acute large vascular territory infarcts. Subcentimeter subtle RIGHT parietal hypodensity may be extra-axial (axial image 25). Basal  cisterns are patent. VASCULAR: Trace calcific atherosclerosis carotid siphon. Mildly dense RIGHT cortical vein (likely [place]). SKULL/SOFT TISSUES: No skull fracture. No significant soft tissue swelling. ORBITS/SINUSES: The included ocular globes and orbital contents are normal.Trace paranasal sinus mucosal thickening. Mastoid air cells are well aerated. OTHER: None. IMPRESSION: Small RIGHT parietal RIGHT parietal subtle hypodensity may be extra-axial. Given adjacent dense cortical vein, venous infarct is possible versus small mass. Recommend MRI of the head with and without contrast and contrast enhanced MRV. Electronically Signed   By: Elon Alas M.D.   On: 10/27/2017 19:08   Ct Angio Neck W Or Wo Contrast  Result Date: 10/28/2017 CLINICAL DATA:  Left hand and face intermittent numbness EXAM: CT ANGIOGRAPHY HEAD AND NECK TECHNIQUE: Multidetector CT imaging of the head and neck was performed using the standard protocol during bolus administration of intravenous contrast. Multiplanar CT image reconstructions and MIPs were obtained to evaluate the vascular anatomy. Carotid stenosis measurements (when applicable) are obtained utilizing NASCET criteria, using the distal internal carotid diameter as the denominator. CONTRAST:  23mL ISOVUE-370 IOPAMIDOL (ISOVUE-370) INJECTION 76% COMPARISON:  None. FINDINGS: CTA NECK FINDINGS AORTIC ARCH: There is no calcific atherosclerosis of the aortic arch. There is no aneurysm, dissection or hemodynamically significant stenosis of the visualized ascending aorta and aortic arch. Conventional 3 vessel aortic branching pattern. The visualized proximal subclavian arteries are widely patent. RIGHT CAROTID SYSTEM: --Common carotid artery: Widely patent origin without common carotid artery dissection or aneurysm. --Internal carotid artery: No dissection, occlusion or aneurysm. Mixed calcified and non-calcified atherosclerotic disease  at the bifurcation, extending into the  internal carotid artery, resulting in 50% stenosis. --External carotid artery: No acute abnormality. LEFT CAROTID SYSTEM: --Common carotid artery: Widely patent origin without common carotid artery dissection or aneurysm. --Internal carotid artery:No dissection, occlusion or aneurysm. Mixed calcified and non-calcified atherosclerotic disease at the bifurcation, extending into the internal carotid artery, resulting in 20% stenosis. --External carotid artery: No acute abnormality. VERTEBRAL ARTERIES: Right dominant configuration. Both origins are normal. No dissection, occlusion or flow-limiting stenosis to the vertebrobasilar confluence. SKELETON: There is no bony spinal canal stenosis. No lytic or blastic lesion. OTHER NECK: Normal pharynx, larynx and major salivary glands. No cervical lymphadenopathy. Unremarkable thyroid gland. UPPER CHEST: No pneumothorax or pleural effusion. No nodules or masses. CTA HEAD FINDINGS ANTERIOR CIRCULATION: --Intracranial internal carotid arteries: Normal. --Anterior cerebral arteries: Normal. Both A1 segments are present. Patent anterior communicating artery. --Middle cerebral arteries: Normal. --Posterior communicating arteries: Absent bilaterally. POSTERIOR CIRCULATION: --Basilar artery: Normal. --Posterior cerebral arteries: Short segment severe stenosis of the right PCA P2 segment. There is occlusion of the left PCA P2 segment. --Superior cerebellar arteries: Normal. --Inferior cerebellar arteries: Normal anterior and posterior inferior cerebellar arteries. VENOUS SINUSES: As permitted by contrast timing, patent. ANATOMIC VARIANTS: None DELAYED PHASE: No parenchymal contrast enhancement. Review of the MIP images confirms the above findings. IMPRESSION: 1. Occlusion of the midportion of the left PCA P2 segment and severe stenosis of the proximal right PCA P2 segment. 2. No other occlusion or hemodynamically significant stenosis of the intracranial arteries. 3. 50% stenosis of the  proximal right ICA. 20% stenosis of the proximal left ICA. Electronically Signed   By: Ulyses Jarred M.D.   On: 10/28/2017 03:33   Mr Jeri Cos And Wo Contrast  Result Date: 10/27/2017 CLINICAL DATA:  57 y/o F; numbness in the left hand and left face intermittent since Saturday. Symptoms worse today. EXAM: MRI HEAD WITHOUT AND WITH CONTRAST MRV HEAD WITHOUT CONTRAST TECHNIQUE: Multiplanar, multiecho pulse sequences of the brain and surrounding structures were obtained without with intravenous contrast. Angiographic images of the intracranial venous structures were obtained using MRV technique without intravenous contrast. COMPARISON:  10/27/2017 CT head CONTRAST:  10 cc Gadavist FINDINGS: MR HEAD FINDINGS Brain: There several scattered foci of reduced diffusion in the right parietal lobe in the right occipitotemporal junction. Some of the lesions have variable diffusion, for example the right occipitotemporal lesion is hypointense on ADC (series 350, image 22) and the right postcentral gyrus lesion is mildly hypointense/near isointense on ADC (series 305, image 47). Additionally, some of the lesions enhance. Findings are consistent with mixed age acute and subacute infarctions. No focal mass effect, extra-axial collection, hydrocephalus, or herniation. A few punctate foci of susceptibility hypointensity are present in the right parietal lobe, discrete from areas of ischemia, compatible hemosiderin deposition of chronic microhemorrhage. Vascular: Normal flow voids. Skull and upper cervical spine: Normal marrow signal. Sinuses/Orbits: Mild mucosal thickening of the ethmoid and sphenoid sinuses. No abnormal signal of the mastoid air cells. Orbits are unremarkable. Other: None. MRV HEAD FINDINGS Normal flow related signal within the superior sagittal sinus, internal cerebral veins, basal veins of Rosenthal, straight sinus, bilateral transverse sinus, bilateral sigmoid sinus, and bilateral upper internal jugular  veins. Additionally, there is normal flow related signal within the large cortical veins. No evidence of dural venous sinus thrombosis. IMPRESSION: 1. Several small scattered foci of acute and subacute infarction (mixed age) are present in the right parietal lobe and the right occipitotemporal junction. No acute hemorrhage. No  significant mass effect. 2. Normal MRV.  No evidence of dural venous sinus thrombosis. These results were called by telephone at the time of interpretation on 10/27/2017 at 10:45 pm to Dr. Maryan Rued, who verbally acknowledged these results. Electronically Signed   By: Kristine Garbe M.D.   On: 10/27/2017 22:50   Mr Mrv Head Wo Cm  Result Date: 10/27/2017 CLINICAL DATA:  57 y/o F; numbness in the left hand and left face intermittent since Saturday. Symptoms worse today. EXAM: MRI HEAD WITHOUT AND WITH CONTRAST MRV HEAD WITHOUT CONTRAST TECHNIQUE: Multiplanar, multiecho pulse sequences of the brain and surrounding structures were obtained without with intravenous contrast. Angiographic images of the intracranial venous structures were obtained using MRV technique without intravenous contrast. COMPARISON:  10/27/2017 CT head CONTRAST:  10 cc Gadavist FINDINGS: MR HEAD FINDINGS Brain: There several scattered foci of reduced diffusion in the right parietal lobe in the right occipitotemporal junction. Some of the lesions have variable diffusion, for example the right occipitotemporal lesion is hypointense on ADC (series 350, image 22) and the right postcentral gyrus lesion is mildly hypointense/near isointense on ADC (series 305, image 47). Additionally, some of the lesions enhance. Findings are consistent with mixed age acute and subacute infarctions. No focal mass effect, extra-axial collection, hydrocephalus, or herniation. A few punctate foci of susceptibility hypointensity are present in the right parietal lobe, discrete from areas of ischemia, compatible hemosiderin deposition of  chronic microhemorrhage. Vascular: Normal flow voids. Skull and upper cervical spine: Normal marrow signal. Sinuses/Orbits: Mild mucosal thickening of the ethmoid and sphenoid sinuses. No abnormal signal of the mastoid air cells. Orbits are unremarkable. Other: None. MRV HEAD FINDINGS Normal flow related signal within the superior sagittal sinus, internal cerebral veins, basal veins of Rosenthal, straight sinus, bilateral transverse sinus, bilateral sigmoid sinus, and bilateral upper internal jugular veins. Additionally, there is normal flow related signal within the large cortical veins. No evidence of dural venous sinus thrombosis. IMPRESSION: 1. Several small scattered foci of acute and subacute infarction (mixed age) are present in the right parietal lobe and the right occipitotemporal junction. No acute hemorrhage. No significant mass effect. 2. Normal MRV.  No evidence of dural venous sinus thrombosis. These results were called by telephone at the time of interpretation on 10/27/2017 at 10:45 pm to Dr. Maryan Rued, who verbally acknowledged these results. Electronically Signed   By: Kristine Garbe M.D.   On: 10/27/2017 22:50       Subjective: No chest pain or sob. No new complaints. Arm weakness improving.   Discharge Exam: Vitals:   10/29/17 0400 10/29/17 0741  BP: 126/60 (!) 145/73  Pulse: 62 67  Resp:  (!) 22  Temp: 97.8 F (36.6 C) 97.8 F (36.6 C)  SpO2: 98% 94%   Vitals:   10/28/17 1957 10/29/17 0014 10/29/17 0400 10/29/17 0741  BP: (!) 145/62 118/60 126/60 (!) 145/73  Pulse: 80 73 62 67  Resp:    (!) 22  Temp: 98.5 F (36.9 C) 97.9 F (36.6 C) 97.8 F (36.6 C) 97.8 F (36.6 C)  TempSrc: Oral Oral Oral Oral  SpO2: 100% 98% 98% 94%  Weight:      Height:        General: Pt is alert, awake, not in acute distress Cardiovascular: RRR, S1/S2 +, no rubs, no gallops Respiratory: CTA bilaterally, no wheezing, no rhonchi Abdominal: Soft, NT, ND, bowel sounds  + Extremities: no edema, no cyanosis    The results of significant diagnostics from this hospitalization (including imaging,  microbiology, ancillary and laboratory) are listed below for reference.     Microbiology: No results found for this or any previous visit (from the past 240 hour(s)).   Labs: BNP (last 3 results) No results for input(s): BNP in the last 8760 hours. Basic Metabolic Panel: Recent Labs  Lab 10/27/17 1638 10/27/17 1659  NA 139 139  K 4.1 4.8  CL 105 106  CO2 26  --   GLUCOSE 92 91  BUN 12 21*  CREATININE 0.95 0.90  CALCIUM 9.2  --    Liver Function Tests: Recent Labs  Lab 10/27/17 1638  AST 17  ALT 20  ALKPHOS 71  BILITOT 0.6  PROT 7.2  ALBUMIN 3.5   No results for input(s): LIPASE, AMYLASE in the last 168 hours. No results for input(s): AMMONIA in the last 168 hours. CBC: Recent Labs  Lab 10/27/17 1638 10/27/17 1659  WBC 8.6  --   NEUTROABS 5.1  --   HGB 13.1 13.6  HCT 42.5 40.0  MCV 86.2  --   PLT 267  --    Cardiac Enzymes: Recent Labs  Lab 10/28/17 0437 10/28/17 0942  TROPONINI <0.03 <0.03   BNP: Invalid input(s): POCBNP CBG: No results for input(s): GLUCAP in the last 168 hours. D-Dimer No results for input(s): DDIMER in the last 72 hours. Hgb A1c Recent Labs    10/28/17 0437  HGBA1C 5.9*   Lipid Profile Recent Labs    10/28/17 0437  CHOL 44  HDL 18*  LDLCALC 20  TRIG 32  CHOLHDL 2.4   Thyroid function studies No results for input(s): TSH, T4TOTAL, T3FREE, THYROIDAB in the last 72 hours.  Invalid input(s): FREET3 Anemia work up No results for input(s): VITAMINB12, FOLATE, FERRITIN, TIBC, IRON, RETICCTPCT in the last 72 hours. Urinalysis    Component Value Date/Time   COLORURINE YELLOW 10/28/2017 [place] 10/28/2017 0657   LABSPEC 1.034 (H) 10/28/2017 0657   PHURINE 7.0 10/28/2017 [place] 10/28/2017 0657   HGBUR NEGATIVE 10/28/2017 0657   BILIRUBINUR NEGATIVE  10/28/2017 0657   KETONESUR NEGATIVE 10/28/2017 0657   PROTEINUR NEGATIVE 10/28/2017 0657   UROBILINOGEN 0.2 07/07/2013 1432   NITRITE NEGATIVE 10/28/2017 0657   LEUKOCYTESUR NEGATIVE 10/28/2017 0657   Sepsis Labs Invalid input(s): PROCALCITONIN,  WBC,  LACTICIDVEN Microbiology No results found for this or any previous visit (from the past 240 hour(s)).   Time coordinating discharge: 36 minutes  SIGNED:   Hosie Poisson, MD  [facility] 10/30/2017, 8:15 AM Pager   If 7PM-7AM, please contact night-coverage [email or web address] Password [email or web address]

## 2017-10-31 ENCOUNTER — Encounter (HOSPITAL_COMMUNITY)
Admission: RE | Admit: 2017-10-31 | Discharge: 2017-10-31 | Disposition: A | Discharge status: Home / Self Care | Payer: 59 | Source: Ambulatory Visit | Attending: Surgery | Admitting: Surgery

## 2017-10-31 ENCOUNTER — Encounter (HOSPITAL_COMMUNITY): Payer: Self-pay

## 2017-10-31 ENCOUNTER — Other Ambulatory Visit: Payer: Self-pay

## 2017-10-31 DIAGNOSIS — Z1389 Encounter for screening for other disorder: Secondary | ICD-10-CM | POA: Diagnosis not present

## 2017-10-31 DIAGNOSIS — I6521 Occlusion and stenosis of right carotid artery: Secondary | ICD-10-CM | POA: Diagnosis not present

## 2017-10-31 DIAGNOSIS — Z01812 Encounter for preprocedural laboratory examination: Secondary | ICD-10-CM | POA: Insufficient documentation

## 2017-10-31 DIAGNOSIS — E039 Hypothyroidism, unspecified: Secondary | ICD-10-CM | POA: Diagnosis not present

## 2017-10-31 DIAGNOSIS — I639 Cerebral infarction, unspecified: Secondary | ICD-10-CM | POA: Diagnosis not present

## 2017-10-31 DIAGNOSIS — R7309 Other abnormal glucose: Secondary | ICD-10-CM | POA: Diagnosis not present

## 2017-10-31 HISTORY — DX: Cerebral infarction, unspecified: I63.9

## 2017-10-31 HISTORY — DX: Pain in right knee: M25.561

## 2017-10-31 LAB — SURGICAL PCR SCREEN
MRSA, PCR: NEGATIVE
Staphylococcus aureus: NEGATIVE

## 2017-10-31 LAB — TYPE AND SCREEN
ABO/RH(D): A POS
Antibody Screen: NEGATIVE

## 2017-10-31 LAB — ABO/RH: ABO/RH(D): A POS

## 2017-11-01 ENCOUNTER — Observation Stay (HOSPITAL_COMMUNITY): Payer: 59

## 2017-11-01 ENCOUNTER — Emergency Department (HOSPITAL_COMMUNITY): Payer: 59

## 2017-11-01 ENCOUNTER — Encounter (HOSPITAL_COMMUNITY): Payer: Self-pay

## 2017-11-01 ENCOUNTER — Other Ambulatory Visit: Payer: Self-pay

## 2017-11-01 ENCOUNTER — Inpatient Hospital Stay (HOSPITAL_COMMUNITY)
Admission: EM | Admit: 2017-11-01 | Discharge: 2017-11-06 | DRG: 039 | Disposition: A | Discharge status: Home / Self Care | Payer: 59 | Attending: Internal Medicine | Admitting: Internal Medicine

## 2017-11-01 DIAGNOSIS — M170 Bilateral primary osteoarthritis of knee: Secondary | ICD-10-CM | POA: Diagnosis present

## 2017-11-01 DIAGNOSIS — Z79890 Hormone replacement therapy (postmenopausal): Secondary | ICD-10-CM

## 2017-11-01 DIAGNOSIS — Z7902 Long term (current) use of antithrombotics/antiplatelets: Secondary | ICD-10-CM

## 2017-11-01 DIAGNOSIS — I10 Essential (primary) hypertension: Secondary | ICD-10-CM | POA: Diagnosis present

## 2017-11-01 DIAGNOSIS — M160 Bilateral primary osteoarthritis of hip: Secondary | ICD-10-CM | POA: Diagnosis not present

## 2017-11-01 DIAGNOSIS — Z6837 Body mass index (BMI) 37.0-37.9, adult: Secondary | ICD-10-CM

## 2017-11-01 DIAGNOSIS — M19012 Primary osteoarthritis, left shoulder: Secondary | ICD-10-CM | POA: Diagnosis present

## 2017-11-01 DIAGNOSIS — R7302 Impaired glucose tolerance (oral): Secondary | ICD-10-CM | POA: Diagnosis present

## 2017-11-01 DIAGNOSIS — R0689 Other abnormalities of breathing: Secondary | ICD-10-CM | POA: Diagnosis not present

## 2017-11-01 DIAGNOSIS — G4489 Other headache syndrome: Secondary | ICD-10-CM | POA: Diagnosis not present

## 2017-11-01 DIAGNOSIS — Z823 Family history of stroke: Secondary | ICD-10-CM | POA: Diagnosis not present

## 2017-11-01 DIAGNOSIS — Z7982 Long term (current) use of aspirin: Secondary | ICD-10-CM | POA: Diagnosis not present

## 2017-11-01 DIAGNOSIS — F17210 Nicotine dependence, cigarettes, uncomplicated: Secondary | ICD-10-CM | POA: Diagnosis not present

## 2017-11-01 DIAGNOSIS — M19032 Primary osteoarthritis, left wrist: Secondary | ICD-10-CM | POA: Diagnosis present

## 2017-11-01 DIAGNOSIS — R200 Anesthesia of skin: Secondary | ICD-10-CM

## 2017-11-01 DIAGNOSIS — I69328 Other speech and language deficits following cerebral infarction: Secondary | ICD-10-CM

## 2017-11-01 DIAGNOSIS — Z91030 Bee allergy status: Secondary | ICD-10-CM

## 2017-11-01 DIAGNOSIS — M19011 Primary osteoarthritis, right shoulder: Secondary | ICD-10-CM | POA: Diagnosis not present

## 2017-11-01 DIAGNOSIS — E038 Other specified hypothyroidism: Secondary | ICD-10-CM | POA: Diagnosis not present

## 2017-11-01 DIAGNOSIS — G459 Transient cerebral ischemic attack, unspecified: Secondary | ICD-10-CM | POA: Diagnosis not present

## 2017-11-01 DIAGNOSIS — I639 Cerebral infarction, unspecified: Secondary | ICD-10-CM | POA: Diagnosis not present

## 2017-11-01 DIAGNOSIS — E669 Obesity, unspecified: Secondary | ICD-10-CM | POA: Diagnosis present

## 2017-11-01 DIAGNOSIS — E039 Hypothyroidism, unspecified: Secondary | ICD-10-CM | POA: Diagnosis present

## 2017-11-01 DIAGNOSIS — K219 Gastro-esophageal reflux disease without esophagitis: Secondary | ICD-10-CM | POA: Diagnosis not present

## 2017-11-01 DIAGNOSIS — Z90710 Acquired absence of both cervix and uterus: Secondary | ICD-10-CM

## 2017-11-01 DIAGNOSIS — R209 Unspecified disturbances of skin sensation: Secondary | ICD-10-CM | POA: Diagnosis not present

## 2017-11-01 DIAGNOSIS — I6521 Occlusion and stenosis of right carotid artery: Secondary | ICD-10-CM | POA: Diagnosis not present

## 2017-11-01 DIAGNOSIS — R202 Paresthesia of skin: Secondary | ICD-10-CM | POA: Diagnosis not present

## 2017-11-01 LAB — COMPREHENSIVE METABOLIC PANEL
ALBUMIN: 3.5 g/dL (ref 3.5–5.0)
ALT: 23 U/L (ref 0–44)
ALT: 20 U/L (ref 0–44)
ANION GAP: 7 (ref 5–15)
AST: 17 U/L (ref 15–41)
AST: 16 U/L (ref 15–41)
Albumin: 3.8 g/dL (ref 3.5–5.0)
Alkaline Phosphatase: 65 U/L (ref 38–126)
Alkaline Phosphatase: 70 U/L (ref 38–126)
Anion gap: 7 (ref 5–15)
BUN: 13 mg/dL (ref 6–20)
BUN: 9 mg/dL (ref 6–20)
CHLORIDE: 104 mmol/L (ref 98–111)
CHLORIDE: 106 mmol/L (ref 98–111)
CO2: 29 mmol/L (ref 22–32)
CO2: 26 mmol/L (ref 22–32)
Calcium: 9.4 mg/dL (ref 8.9–10.3)
Calcium: 9.3 mg/dL (ref 8.9–10.3)
Creatinine, Ser: 0.75 mg/dL (ref 0.44–1.00)
Creatinine, Ser: 0.84 mg/dL (ref 0.44–1.00)
GFR calc Af Amer: >60 mL/min (ref >60)
GFR calc non Af Amer: >60 mL/min (ref >60)
Glucose, Bld: 98 mg/dL (ref 70–99)
Glucose, Bld: 98 mg/dL (ref 70–99)
POTASSIUM: 3.8 mmol/L (ref 3.5–5.1)
POTASSIUM: 4.4 mmol/L (ref 3.5–5.1)
SODIUM: 140 mmol/L (ref 135–145)
Sodium: 139 mmol/L (ref 135–145)
Total Bilirubin: 0.8 mg/dL (ref 0.3–1.2)
Total Bilirubin: 0.8 mg/dL (ref 0.3–1.2)
Total Protein: 7.5 g/dL (ref 6.5–8.1)
Total Protein: 6.9 g/dL (ref 6.5–8.1)

## 2017-11-01 LAB — DIFFERENTIAL
ABS IMMATURE GRANULOCYTES: 0.01 10*3/uL (ref 0.00–0.07)
BASOS ABS: 0 10*3/uL (ref 0.0–0.1)
BASOS PCT: 1 %
EOS ABS: 0.1 10*3/uL (ref 0.0–0.5)
Eosinophils Relative: 2 %
Immature Granulocytes: 0 %
Lymphocytes Relative: 32 %
Lymphs Abs: 2.5 10*3/uL (ref 0.7–4.0)
MONO ABS: 0.8 10*3/uL (ref 0.1–1.0)
Monocytes Relative: 11 %
Neutro Abs: 4.3 10*3/uL (ref 1.7–7.7)
Neutrophils Relative %: 54 %

## 2017-11-01 LAB — CBC
HCT: 41.1 % (ref 36.0–46.0)
HCT: 41.6 % (ref 36.0–46.0)
HEMOGLOBIN: 13 g/dL (ref 12.0–15.0)
Hemoglobin: 13.3 g/dL (ref 12.0–15.0)
MCH: 27.5 pg (ref 26.0–34.0)
MCH: 27.3 pg (ref 26.0–34.0)
MCHC: 31.6 g/dL (ref 30.0–36.0)
MCHC: 32 g/dL (ref 30.0–36.0)
MCV: 87.1 fL (ref 80.0–100.0)
MCV: 85.2 fL (ref 80.0–100.0)
PLATELETS: 237 10*3/uL (ref 150–400)
PLATELETS: 243 10*3/uL (ref 150–400)
RBC: 4.72 MIL/uL (ref 3.87–5.11)
RBC: 4.88 MIL/uL (ref 3.87–5.11)
RDW: 15 % (ref 11.5–15.5)
RDW: 14.9 % (ref 11.5–15.5)
WBC: 7.7 10*3/uL (ref 4.0–10.5)
WBC: 6 10*3/uL (ref 4.0–10.5)
nRBC: 0 % (ref 0.0–0.2)
nRBC: 0 % (ref 0.0–0.2)

## 2017-11-01 LAB — URINALYSIS, ROUTINE W REFLEX MICROSCOPIC
BILIRUBIN URINE: NEGATIVE
GLUCOSE, UA: NEGATIVE mg/dL
HGB URINE DIPSTICK: NEGATIVE
Ketones, ur: NEGATIVE mg/dL
Leukocytes, UA: NEGATIVE
Nitrite: NEGATIVE
PROTEIN: NEGATIVE mg/dL
SPECIFIC GRAVITY, URINE: 1.008 (ref 1.005–1.030)
pH: 7 (ref 5.0–8.0)

## 2017-11-01 LAB — ETHANOL: Alcohol, Ethyl (B): <10 mg/dL (ref <10)

## 2017-11-01 LAB — I-STAT CHEM 8, ED
BUN: 13 mg/dL (ref 6–20)
CHLORIDE: 103 mmol/L (ref 98–111)
CREATININE: 0.8 mg/dL (ref 0.44–1.00)
Calcium, Ion: 1.19 mmol/L (ref 1.15–1.40)
GLUCOSE: 96 mg/dL (ref 70–99)
HCT: 40 % (ref 36.0–46.0)
Hemoglobin: 13.6 g/dL (ref 12.0–15.0)
Potassium: 3.7 mmol/L (ref 3.5–5.1)
Sodium: 141 mmol/L (ref 135–145)
TCO2: 30 mmol/L (ref 22–32)

## 2017-11-01 LAB — APTT
APTT: 29 s (ref 24–36)
aPTT: 29 seconds (ref 24–36)

## 2017-11-01 LAB — PROTIME-INR
INR: 0.94
INR: 0.97
Prothrombin Time: 12.5 seconds (ref 11.4–15.2)
Prothrombin Time: 12.8 seconds (ref 11.4–15.2)

## 2017-11-01 LAB — I-STAT BETA HCG BLOOD, ED (MC, WL, AP ONLY)

## 2017-11-01 LAB — RAPID URINE DRUG SCREEN, HOSP PERFORMED
AMPHETAMINES: NOT DETECTED
BARBITURATES: NOT DETECTED
BENZODIAZEPINES: NOT DETECTED
COCAINE: NOT DETECTED
OPIATES: NOT DETECTED
TETRAHYDROCANNABINOL: NOT DETECTED

## 2017-11-01 MED ORDER — SENNOSIDES-DOCUSATE SODIUM 8.6-50 MG PO TABS
2.0000 | ORAL_TABLET | Freq: Every day | ORAL | Status: DC
  Administered 2017-11-01 – 2017-11-06 (×5): 2 via ORAL
  Filled 2017-11-01 (×9): qty 2

## 2017-11-01 MED ORDER — CLOPIDOGREL BISULFATE 75 MG PO TABS
75.0000 mg | ORAL_TABLET | Freq: Every day | ORAL | Status: DC
  Administered 2017-11-01 – 2017-11-06 (×5): 75 mg via ORAL
  Filled 2017-11-01 (×6): qty 1

## 2017-11-01 MED ORDER — ACETAMINOPHEN 650 MG RE SUPP: 650.0000 mg | Freq: Four times a day (QID) | RECTAL | Status: DC | PRN

## 2017-11-01 MED ORDER — HEPARIN SODIUM (PORCINE) 5000 UNIT/ML IJ SOLN
5000.0000 [IU] | Freq: Three times a day (TID) | INTRAMUSCULAR | Status: DC
  Administered 2017-11-01 – 2017-11-02 (×4): 5000 [IU] via SUBCUTANEOUS
  Filled 2017-11-01 (×4): qty 1

## 2017-11-01 MED ORDER — ATORVASTATIN CALCIUM 20 MG PO TABS
20.0000 mg | ORAL_TABLET | Freq: Every day | ORAL | Status: DC
  Administered 2017-11-02 – 2017-11-04 (×3): 20 mg via ORAL
  Filled 2017-11-01 (×3): qty 1

## 2017-11-01 MED ORDER — CHLORHEXIDINE GLUCONATE 4 % EX LIQD
60.0000 mL | Freq: Once | CUTANEOUS | Status: AC
  Administered 2017-11-02: 4 via TOPICAL
  Filled 2017-11-01: qty 60

## 2017-11-01 MED ORDER — ACETAMINOPHEN 325 MG PO TABS: 650.0000 mg | ORAL_TABLET | Freq: Four times a day (QID) | ORAL | Status: DC | PRN

## 2017-11-01 MED ORDER — NICOTINE 14 MG/24HR TD PT24
14.0000 mg | MEDICATED_PATCH | Freq: Every day | TRANSDERMAL | Status: DC
  Administered 2017-11-01: 14 mg via TRANSDERMAL
  Filled 2017-11-01: qty 1

## 2017-11-01 MED ORDER — NICOTINE 21 MG/24HR TD PT24
21.0000 mg | MEDICATED_PATCH | Freq: Every day | TRANSDERMAL | Status: DC
  Administered 2017-11-01 – 2017-11-06 (×5): 21 mg via TRANSDERMAL
  Filled 2017-11-01 (×6): qty 1

## 2017-11-01 MED ORDER — SODIUM CHLORIDE 0.9 % IV SOLN: 250.0000 mL | INTRAVENOUS | Status: DC | PRN

## 2017-11-01 MED ORDER — SODIUM CHLORIDE 0.9 % IV SOLN: INTRAVENOUS | Status: DC

## 2017-11-01 MED ORDER — CHLORHEXIDINE GLUCONATE 4 % EX LIQD
60.0000 mL | Freq: Once | CUTANEOUS | Status: AC
  Administered 2017-11-03: 4 via TOPICAL
  Filled 2017-11-01 (×2): qty 60

## 2017-11-01 MED ORDER — PANTOPRAZOLE SODIUM 40 MG PO TBEC
40.0000 mg | DELAYED_RELEASE_TABLET | Freq: Every day | ORAL | Status: DC
  Administered 2017-11-01 – 2017-11-06 (×5): 40 mg via ORAL
  Filled 2017-11-01 (×6): qty 1

## 2017-11-01 MED ORDER — SODIUM CHLORIDE 0.9% FLUSH: 3.0000 mL | INTRAVENOUS | Status: DC | PRN

## 2017-11-01 MED ORDER — ASPIRIN 325 MG PO TABS
325.0000 mg | ORAL_TABLET | Freq: Every day | ORAL | Status: DC
  Administered 2017-11-01 – 2017-11-06 (×5): 325 mg via ORAL
  Filled 2017-11-01 (×6): qty 1

## 2017-11-01 MED ORDER — FESOTERODINE FUMARATE ER 4 MG PO TB24
4.0000 mg | ORAL_TABLET | Freq: Every day | ORAL | Status: DC
  Administered 2017-11-01 – 2017-11-06 (×4): 4 mg via ORAL
  Filled 2017-11-01 (×8): qty 1

## 2017-11-01 MED ORDER — SODIUM CHLORIDE 0.9% FLUSH
3.0000 mL | Freq: Two times a day (BID) | INTRAVENOUS | Status: DC
  Administered 2017-11-01 – 2017-11-06 (×8): 3 mL via INTRAVENOUS

## 2017-11-01 MED ORDER — CEFAZOLIN SODIUM-DEXTROSE 2-4 GM/100ML-% IV SOLN: 2.0000 g | INTRAVENOUS | Status: DC

## 2017-11-01 MED ORDER — LEVOTHYROXINE SODIUM 25 MCG PO TABS
125.0000 ug | ORAL_TABLET | Freq: Every day | ORAL | Status: DC
  Administered 2017-11-01 – 2017-11-04 (×3): 125 ug via ORAL
  Filled 2017-11-01 (×2): qty 1
  Filled 2017-11-01: qty 3

## 2017-11-01 MED ORDER — DILTIAZEM HCL ER 180 MG PO CP24
180.0000 mg | ORAL_CAPSULE | Freq: Every day | ORAL | Status: DC
  Filled 2017-11-01 (×2): qty 1

## 2017-11-01 MED ORDER — ADULT MULTIVITAMIN W/MINERALS CH
1.0000 | ORAL_TABLET | Freq: Every day | ORAL | Status: DC
  Administered 2017-11-01 – 2017-11-06 (×5): 1 via ORAL
  Filled 2017-11-01 (×6): qty 1

## 2017-11-01 NOTE — H&P (Signed)
TRH H&P   Patient Demographics:    Angela Abbott, is a 57 y.o. female  MRN: [id number]   DOB - 13-Aug-1960  Admit Date - 11/01/2017  Outpatient Primary MD for the patient is Sharilyn Sites, MD  Referring MD/NP/PA:  Merrily Pew  Outpatient Specialists:   Patient coming from: home  Chief Complaint  Patient presents with  . Tingling    tongue, lt hand and face.      HPI:    Angela Abbott  is a 57 y.o. female, w gerd, hypertension, hypothyroidism, CVA, who presents with c/o difficulty with word finding starting yesterday.  Pt today at midnite apparently had left sided numbness lasting a few minutes.  Pt denies headache, vision change, focal weakness, cp, palp, sob, n/v, diarrhea, brbpr, black stool.    ED spoke with neurology who recommended MRI brain, ED spoke with vascular and per ED, no eminent surgical intervention.   T 97.8  P 61Bp 176/82  Pox 100%  CT brain IMPRESSION: Decreased attenuation at sites in the lateral superior right parietal lobe and superior posterior right centrum semiovale are consistent with recent infarcts demonstrated on recent MR. Several other small acute infarcts demonstrated on recent MR are not appreciable on this noncontrast enhanced CT examination. No acute infarct compared to recent prior study evident by CT. No mass or hemorrhage.  There are foci of arterial vascular calcification. There is ethmoid sinus disease.  Etoh <10 INR 0.94 PTT 29  Wbc 7.7 Hgb 13.0, Plt 237  Na 140, K 3.8, Bun 13, Creatinine 0.75  Pt will be admitted for numbness, ? TIA vs CVA    Review of systems:    In addition to the HPI above,  No Fever-chills, No Headache, No changes with Vision or hearing, No problems swallowing food or Liquids, No Chest pain, Cough or Shortness of Breath, No Abdominal pain, No Nausea or Vommitting, Bowel movements are  regular, No Blood in stool or Urine, No dysuria, No new skin rashes or bruises, No new joints pains-aches,  No new weakness, tingling, numbness in any extremity, No recent weight gain or loss, No polyuria, polydypsia or polyphagia, No significant Mental Stressors.  A full 10 point Review of Systems was done, except as stated above, all other Review of Systems were negative.   With Past History of the following :    Past Medical History:  Diagnosis Date  . Arthritis    shoulders, knees, hips  . Dental crowns present   . GERD (gastroesophageal reflux disease)   . Hypertension   . Hypothyroidism   . Left knee pain 07/2014  . Pneumonia    hx of 02/2017   . Right knee pain   . Stress incontinence   . Stroke [facility])    speech difficulty recalling words  . Wears partial dentures    upper and lower  Past Surgical History:  Procedure Laterality Date  . ABDOMINAL HYSTERECTOMY     partial  . [place]  . ESOPHAGOGASTRODUODENOSCOPY N/A 11/26/2012   Procedure: ESOPHAGOGASTRODUODENOSCOPY (EGD);  Surgeon: Shann Medal, MD;  Location: [facility];  Service: General;  Laterality: N/A;  . GANGLION CYST EXCISION Right 02/2008   foot  . KNEE ARTHROSCOPY WITH LATERAL MENISECTOMY Left 07/21/2014   Procedure: LEFT KNEE ARTHROSCOPY,  CHONDROPLASTY, WITH LATERAL AND MEDIAL  MENISCECTOMIES;  Surgeon: Kathryne Hitch, MD;  Location: [facility];  Service: Orthopedics;  Laterality: Left;  . KNEE ARTHROSCOPY WITH MEDIAL MENISECTOMY Left 07/21/2014   Procedure: KNEE ARTHROSCOPY WITH MEDIAL MENISECTOMY;  Surgeon: Kathryne Hitch, MD;  Location: [place];  Service: Orthopedics;  Laterality: Left;  . LAPAROSCOPIC GASTRIC BANDING  02/18/2008  . LAPAROSCOPIC REPAIR AND REMOVAL OF GASTRIC BAND N/A 03/21/2017   Procedure: LAPAROSCOPIC REPAIR AND REMOVAL OF GASTRIC BAND;  Surgeon: [name], MD;  Location: [facility];  Service: General;  Laterality: N/A;  .  PARTIAL HYSTERECTOMY  09/07/2002  . SHOULDER ARTHROSCOPY WITH SUBACROMIAL DECOMPRESSION, ROTATOR CUFF REPAIR AND BICEP TENDON REPAIR Right 11/23/2015   Procedure: RIGHT SHOULDER ARTHROSCOPY WITH DEBRIDEMENT, SUBACROMIAL DECOMPRESSION, DISTAL CLAVICLE EXCISION, ROTATOR CUFF REPAIR AND BICEP TENODESIS;  Surgeon: Ninetta Lights, MD;  Location: [facility];  Service: Orthopedics;  Laterality: Right;      Social History:     Social History   Tobacco Use  . Smoking status: Current Every Day Smoker    Packs/day: 1.00    Years: 35.00    Pack years: 35.00    Types: Cigarettes  . Smokeless tobacco: Never Used  . Tobacco comment: in process of quiting only 1 cigeratte today  Substance Use Topics  . Alcohol use: Yes    Comment: occasionally     Lives - at home  Mobility - walks by self   Family History :     Family History  Problem Relation Age of Onset  . CVA Other   . Diabetes Other   . CAD Other   . CVA Mother   . Cancer Neg Hx        Home Medications:   Prior to Admission medications   Medication Sig Start Date End Date Taking? Authorizing Provider  acetaminophen (TYLENOL) 500 MG tablet Take 1,000 mg by mouth daily as needed for moderate pain or headache.    [provider]  aspirin 325 MG tablet Take 1 tablet (325 mg total) by mouth daily. 10/29/17   Hosie Poisson, MD  atorvastatin (LIPITOR) 20 MG tablet Take 1 tablet (20 mg total) by mouth daily at 6 PM. 10/29/17   Hosie Poisson, MD  clopidogrel (PLAVIX) 75 MG tablet Take 1 tablet (75 mg total) by mouth daily. Use plavix for 3 weeks, followed by aspirin alone. 10/29/17   Hosie Poisson, MD  diltiazem (DILACOR XR) 180 MG 24 hr capsule Take 180 mg by mouth at bedtime.    [provider]  ibuprofen (ADVIL,MOTRIN) 200 MG tablet Take 400-800 mg by mouth daily as needed for headache or moderate pain.    [provider]  levothyroxine (SYNTHROID, LEVOTHROID) 125 MCG tablet Take 125 mcg by  mouth daily before breakfast.    [provider]  Multiple Vitamin (MULTIVITAMIN WITH MINERALS) TABS tablet Take 1 tablet by mouth daily.    [provider]  naproxen sodium (ALEVE) 220 MG tablet Take 440 mg by mouth daily as needed (pain).  [provider]  omeprazole (PRILOSEC) 40 MG capsule Take 1 capsule (40 mg total) by mouth daily. 02/15/17   Kathie Dike, MD  senna-docusate (SENOKOT-S) 8.6-50 MG tablet Take 1 tablet by mouth at bedtime as needed for mild constipation. Patient taking differently: Take 2 tablets by mouth daily.  10/29/17   Hosie Poisson, MD  tolterodine (DETROL LA) 4 MG 24 hr capsule Take 4 mg by mouth daily.    [provider]     Allergies:     Allergies  Allergen Reactions  . Bee Venom Shortness Of Breath and Swelling     Physical Exam:   Vitals  Blood pressure (!) 176/82, pulse 61, temperature 97.8 F (36.6 C), temperature source Oral, resp. rate 18, weight 106.6 kg, SpO2 100 %.   1. General  lying in bed in NAD,  2. Normal affect and insight, Not Suicidal or Homicidal, Awake Alert, Oriented X 3.  3. No F.N deficits, ALL C.Nerves Intact, Strength 5/5 all 4 extremities, Sensation intact all 4 extremities, Plantars down going.  4. Ears and Eyes appear Normal, Conjunctivae clear, PERRLA. Moist Oral Mucosa.  5. Supple Neck, No JVD, No cervical lymphadenopathy appriciated, No Carotid Bruits.  6. Symmetrical Chest wall movement, Good air movement bilaterally, CTAB.  7. RRR, No Gallops, Rubs or Murmurs, No Parasternal Heave.  8. Positive Bowel Sounds, Abdomen Soft, No tenderness, No organomegaly appriciated,No rebound -guarding or rigidity.  9.  No Cyanosis, Normal Skin Turgor, No Skin Rash or Bruise.  10. Good muscle tone,  joints appear normal , no effusions, Normal ROM.  11. No Palpable Lymph Nodes in Neck or Axillae    Data Review:    CBC Recent Labs  Lab 10/27/17 1638 10/27/17 1659 11/01/17 0146  11/01/17 0209  WBC 8.6  --  7.7  --   HGB 13.1 13.6 13.0 13.6  HCT 42.5 40.0 41.1 40.0  PLT 267  --  237  --   MCV 86.2  --  87.1  --   MCH 26.6  --  27.5  --   MCHC 30.8  --  31.6  --   RDW 15.2  --  15.0  --   LYMPHSABS 2.6  --  2.5  --   MONOABS 0.7  --  0.8  --   EOSABS 0.1  --  0.1  --   BASOSABS 0.1  --  0.0  --    ------------------------------------------------------------------------------------------------------------------  Chemistries  Recent Labs  Lab 10/27/17 1638 10/27/17 1659 11/01/17 0146 11/01/17 0209  NA 139 139 140 141  K 4.1 4.8 3.8 3.7  CL 105 106 104 103  CO2 26  --  29  --   GLUCOSE 92 91 98 96  BUN 12 21* 13 13  CREATININE 0.95 0.90 0.75 0.80  CALCIUM 9.2  --  9.4  --   AST 17  --  17  --   ALT 20  --  23  --   ALKPHOS 71  --  65  --   BILITOT 0.6  --  0.8  --    ------------------------------------------------------------------------------------------------------------------ estimated creatinine clearance is 94.6 mL/min (by C-G formula based on SCr of 0.8 mg/dL). ------------------------------------------------------------------------------------------------------------------ No results for input(s): TSH, T4TOTAL, T3FREE, THYROIDAB in the last 72 hours.  Invalid input(s): FREET3  Coagulation profile Recent Labs  Lab 10/27/17 1638 11/01/17 0146  INR 0.98 0.94   ------------------------------------------------------------------------------------------------------------------- No results for input(s): DDIMER in the last 72 hours. -------------------------------------------------------------------------------------------------------------------  Cardiac Enzymes Recent Labs  Lab 10/28/17 0437 10/28/17 0942  TROPONINI <0.03 <0.03   ------------------------------------------------------------------------------------------------------------------ No results found for:  BNP   ---------------------------------------------------------------------------------------------------------------  Urinalysis    Component Value Date/Time   COLORURINE YELLOW 10/28/2017 [place] 10/28/2017 0657   LABSPEC 1.034 (H) 10/28/2017 0657   PHURINE 7.0 10/28/2017 [place] 10/28/2017 0657   HGBUR NEGATIVE 10/28/2017 0657   BILIRUBINUR NEGATIVE 10/28/2017 0657   KETONESUR NEGATIVE 10/28/2017 0657   PROTEINUR NEGATIVE 10/28/2017 0657   UROBILINOGEN 0.2 07/07/2013 1432   NITRITE NEGATIVE 10/28/2017 0657   LEUKOCYTESUR NEGATIVE 10/28/2017 0657    ----------------------------------------------------------------------------------------------------------------   Imaging Results:    Ct Head Wo Contrast  Result Date: 11/01/2017 CLINICAL DATA:  Numbness of the left hand, left side of face, and tongue EXAM: CT HEAD WITHOUT CONTRAST TECHNIQUE: Contiguous axial images were obtained from the base of the skull through the vertex without intravenous contrast. COMPARISON:  Head CT and brain MRI October 27, 2017 FINDINGS: Brain: The ventricles are normal in size and configuration. There is no appreciable intracranial mass, hemorrhage, extra-axial fluid collection, or midline shift. There is a focal area of decreased attenuation in the superior right centrum semiovale, consistent with recent infarct noted in this area. There is subtle decreased attenuation in the posterosuperior right parietal lobe at the site of a recent prior infarct. Sites of other small recent infarcts in the right parietal lobe are not appreciable on this noncontrast enhanced study. Brain parenchyma elsewhere appears unremarkable. No new infarct is appreciable on noncontrast enhanced CT compared to recent studies. Vascular: No hyperdense vessel. There is calcification in each carotid siphon region. Skull: The bony calvarium appears intact. Sinuses/Orbits: There is opacification and mucosal  thickening in several ethmoid air cells. Other visualized paranasal sinuses are clear. Visualized orbits appear symmetric bilaterally. Other: Visualized mastoid air cells are clear. IMPRESSION: Decreased attenuation at sites in the lateral superior right parietal lobe and superior posterior right centrum semiovale are consistent with recent infarcts demonstrated on recent MR. Several other small acute infarcts demonstrated on recent MR are not appreciable on this noncontrast enhanced CT examination. No acute infarct compared to recent prior study evident by CT. No mass or hemorrhage. There are foci of arterial vascular calcification. There is ethmoid sinus disease. Electronically Signed   By: Lowella Grip III M.D.   On: 11/01/2017 02:30   ekg nsr at 51, LAD, no st-t changes c/w ischemia   Assessment & Plan:    Principal Problem:   Numbness Active Problems:   Cigarette smoker   Hypothyroidism   CVA (cerebral vascular accident) ([place])   TIA (transient ischemic attack)    Numbness left side of face TIA CVA Cont aspirin Cont Plavix 75mg  po qday Cont Lipitor 20mg  po qhs Cont Cardizem XR 180mg  po qday Check MRI brain r/o CVA Please consult neurology once MRI brain completed  Carotid stenosis Consider vascular surgery consult depending upon neurology recommendations  Gerd Cont PPI  Hypothyroidism Cont Levothyroxine 125 microgram po qday  Nicotine dep, nicotine patch prn   DVT Prophylaxis    SCDs   AM Labs Ordered, also please review Full Orders  Family Communication: Admission, patients condition and plan of care including tests being ordered have been discussed with the patient  who indicate understanding and agree with the plan and Code Status.  Code Status  FULL CODE  Likely DC to  home  Condition GUARDED    Consults called: none, please call neurology once MRI brain performed  Admission status: observation  Time spent in minutes : 70   Jani Gravel M.D on  11/01/2017 at 5:18 AM  Between 7am to 7pm - Pager - [phone]  . After 7pm go to [email or web address] - password [facility]  [facility] - Office  [phone]

## 2017-11-01 NOTE — ED Notes (Signed)
Missing dose of Senokot in Pyxis. Pharmacy notified.

## 2017-11-01 NOTE — ED Provider Notes (Signed)
Emergency Department Provider Note   I have reviewed the triage vital signs and the nursing notes.   HISTORY  Chief Complaint Tingling (tongue, lt hand and face.)   HPI Angela Abbott is a 57 y.o. female with known right carotid artery stenosis of 99% pending surgery next week the presents to the emergency department today with left-sided facial numbness, thick tongue, decreased strength in her left hand and paresthesias in her left hand that has since resolved.  Patient states is doing better 9 or 930 and these were not present but when she woke up it 11:45 they were so she came here.  They had resolved prior to my evaluation.  She is also noticed some word finding difficulties over the day that was not previously present.  She was recently admitted to the hospital a few days ago found to have a stroke and after they found the carotid artery stenosis.  She states these are similar symptoms to when she was here last time aside from word-finding difficulty. No other associated or modifying symptoms.    Past Medical History:  Diagnosis Date  . Arthritis    shoulders, knees, hips  . Dental crowns present   . GERD (gastroesophageal reflux disease)   . Hypertension   . Hypothyroidism   . Left knee pain 07/2014  . Pneumonia    hx of 02/2017   . Right knee pain   . Stress incontinence   . Stroke [facility])    speech difficulty recalling words  . Wears partial dentures    upper and lower    Patient Active Problem List   Diagnosis Date Noted  . Numbness 11/01/2017  . TIA (transient ischemic attack) 11/01/2017  . CVA (cerebral vascular accident) ([place]) 10/27/2017  . Tobacco abuse 10/27/2017  . Chest pain 02/14/2017  . Multifocal pneumonia 02/14/2017  . Hypothyroidism 02/14/2017  . GERD (gastroesophageal reflux disease) 02/14/2017  . Pneumonia 02/14/2017  . Cigarette smoker 11/13/2016  . Chronic cough 11/12/2016  . Sciatica of left side 05/18/2013  . Arthritis of wrist, left,  degenerative 03/30/2013  . History of laparoscopic adjustable gastric banding, 02/17/2008 04/29/2012  . Morbid obesity ([place]) 01/16/2012  . PLANTAR FACIITIS 07/01/2007  . KNEE PAIN 04/29/2007  . TEAR MEDIAL MENISCUS 04/29/2007    Past Surgical History:  Procedure Laterality Date  . ABDOMINAL HYSTERECTOMY     partial  . [place]  . ESOPHAGOGASTRODUODENOSCOPY N/A 11/26/2012   Procedure: ESOPHAGOGASTRODUODENOSCOPY (EGD);  Surgeon: Shann Medal, MD;  Location: [facility];  Service: General;  Laterality: N/A;  . GANGLION CYST EXCISION Right 02/2008   foot  . KNEE ARTHROSCOPY WITH LATERAL MENISECTOMY Left 07/21/2014   Procedure: LEFT KNEE ARTHROSCOPY,  CHONDROPLASTY, WITH LATERAL AND MEDIAL  MENISCECTOMIES;  Surgeon: Kathryne Hitch, MD;  Location: [place];  Service: Orthopedics;  Laterality: Left;  . KNEE ARTHROSCOPY WITH MEDIAL MENISECTOMY Left 07/21/2014   Procedure: KNEE ARTHROSCOPY WITH MEDIAL MENISECTOMY;  Surgeon: Kathryne Hitch, MD;  Location: [place];  Service: Orthopedics;  Laterality: Left;  . LAPAROSCOPIC GASTRIC BANDING  02/18/2008  . LAPAROSCOPIC REPAIR AND REMOVAL OF GASTRIC BAND N/A 03/21/2017   Procedure: LAPAROSCOPIC REPAIR AND REMOVAL OF GASTRIC BAND;  Surgeon: [name], MD;  Location: [facility];  Service: General;  Laterality: N/A;  . PARTIAL HYSTERECTOMY  09/07/2002  . SHOULDER ARTHROSCOPY WITH SUBACROMIAL DECOMPRESSION, ROTATOR CUFF REPAIR AND BICEP TENDON REPAIR Right 11/23/2015   Procedure: RIGHT SHOULDER ARTHROSCOPY WITH DEBRIDEMENT, SUBACROMIAL  DECOMPRESSION, DISTAL CLAVICLE EXCISION, ROTATOR CUFF REPAIR AND BICEP TENODESIS;  Surgeon: Ninetta Lights, MD;  Location: [place];  Service: Orthopedics;  Laterality: Right;    Current Outpatient Rx  . Order #: [id number] Class: Historical Med  . Order #: [id number] Class: Normal  . Order #: [id number] Class: Normal  . Order #: [id number] Class: Normal  . Order  #: [id number] Class: Historical Med  . Order #: [id number] Class: Historical Med  . Order #: [id number] Class: Historical Med  . Order #: [id number] Class: Historical Med  . Order #: [id number] Class: Historical Med  . Order #: [id number] Class: Normal  . Order #: [id number] Class: Normal  . Order #: [id number] Class: Historical Med    Allergies Bee venom  Family History  Problem Relation Age of Onset  . CVA Other   . Diabetes Other   . CAD Other   . CVA Mother   . Cancer Neg Hx     Social History Social History   Tobacco Use  . Smoking status: Current Every Day Smoker    Packs/day: 1.00    Years: 35.00    Pack years: 35.00    Types: Cigarettes  . Smokeless tobacco: Never Used  . Tobacco comment: in process of quiting only 1 cigeratte today  Substance Use Topics  . Alcohol use: Yes    Comment: occasionally  . Drug use: No    Review of Systems  All other systems negative except as documented in the HPI. All pertinent positives and negatives as reviewed in the HPI. ____________________________________________   PHYSICAL EXAM:  VITAL SIGNS: ED Triage Vitals [11/01/17 0112]  Enc Vitals Group     BP (!) 176/78     Pulse Rate 66     Resp 11     Temp 97.8 F (36.6 C)     Temp Source Oral     SpO2 96 %     Weight 235 lb (106.6 kg)    Constitutional: Alert and oriented. Well appearing and in no acute distress. Eyes: Conjunctivae are normal. PERRL. EOMI. Head: Atraumatic. Nose: No congestion/rhinnorhea. Mouth/Throat: Mucous membranes are moist.  Oropharynx non-erythematous. Neck: No stridor.  No meningeal signs.   Cardiovascular: Normal rate, regular rhythm. Good peripheral circulation. Grossly normal heart sounds.   Respiratory: Normal respiratory effort.  No retractions. Lungs CTAB. Gastrointestinal: Soft and nontender. No distention.  Musculoskeletal: No lower extremity tenderness nor edema. No gross deformities of extremities. Neurologic:  Normal speech and language.  No gross focal neurologic deficits are appreciated.  Skin:  Skin is warm, dry and intact. No rash noted.  ____________________________________________   LABS (all labs ordered are listed, but only abnormal results are displayed)  Labs Reviewed  ETHANOL  PROTIME-INR  APTT  CBC  DIFFERENTIAL  COMPREHENSIVE METABOLIC PANEL  RAPID URINE DRUG SCREEN, HOSP PERFORMED  URINALYSIS, ROUTINE W REFLEX MICROSCOPIC  I-STAT CHEM 8, ED  I-STAT TROPONIN, ED  I-STAT BETA HCG BLOOD, ED ([facility], [facility], [facility] ONLY)   ____________________________________________  EKG   EKG Interpretation  Date/Time:  Saturday November 01 2017 01:10:50 EDT Ventricular Rate:  65 PR Interval:    QRS Duration: 99 QT Interval:  394 QTC Calculation: 410 R Axis:   -45 Text Interpretation:  Sinus rhythm Left anterior fascicular block Abnormal R-wave progression, late transition No significant change since last tracing Confirmed by Merrily Pew [phone]) on 11/01/2017 1:52:39 AM       ____________________________________________  RADIOLOGY  Ct Head Wo Contrast  Result Date: 11/01/2017 CLINICAL DATA:  Numbness of the left hand,  left side of face, and tongue EXAM: CT HEAD WITHOUT CONTRAST TECHNIQUE: Contiguous axial images were obtained from the base of the skull through the vertex without intravenous contrast. COMPARISON:  Head CT and brain MRI October 27, 2017 FINDINGS: Brain: The ventricles are normal in size and configuration. There is no appreciable intracranial mass, hemorrhage, extra-axial fluid collection, or midline shift. There is a focal area of decreased attenuation in the superior right centrum semiovale, consistent with recent infarct noted in this area. There is subtle decreased attenuation in the posterosuperior right parietal lobe at the site of a recent prior infarct. Sites of other small recent infarcts in the right parietal lobe are not appreciable on this noncontrast enhanced study. Brain parenchyma elsewhere  appears unremarkable. No new infarct is appreciable on noncontrast enhanced CT compared to recent studies. Vascular: No hyperdense vessel. There is calcification in each carotid siphon region. Skull: The bony calvarium appears intact. Sinuses/Orbits: There is opacification and mucosal thickening in several ethmoid air cells. Other visualized paranasal sinuses are clear. Visualized orbits appear symmetric bilaterally. Other: Visualized mastoid air cells are clear. IMPRESSION: Decreased attenuation at sites in the lateral superior right parietal lobe and superior posterior right centrum semiovale are consistent with recent infarcts demonstrated on recent MR. Several other small acute infarcts demonstrated on recent MR are not appreciable on this noncontrast enhanced CT examination. No acute infarct compared to recent prior study evident by CT. No mass or hemorrhage. There are foci of arterial vascular calcification. There is ethmoid sinus disease. Electronically Signed   By: Lowella Grip III M.D.   On: 11/01/2017 02:30    ____________________________________________   PROCEDURES  Procedure(s) performed:   Procedures   ____________________________________________   INITIAL IMPRESSION / ASSESSMENT AND PLAN / ED COURSE  57 yo F w/ TIA type picture in the setting of 99% carotid artery stenosis.  The symptoms are similar to previous one and she is hypertensive today as well.  We will get a quick head CT and check basic labs but will need to discuss with neurology and possibly vascular surgery about whether or not she needs to be admitted pending surgery.  Vascular surgery, Dr. Carlis Abbott, stated that these were likely related to known R CA stenosis and would not change their management unless large area of deficit or further symptoms 48 hours prior to surgery but they deferred to neurology for further workup.   Discussed with neurology, Dr. Rory Percy, who stated patient symptoms likely from previously  seen strokes except word-finding difficulty which is new. Stated she needs and MRI to see if there was a new lesion. If so and large then need to d/w Vascular again. If small or none, then could be discharged to continue with plan as previously made. Stated she could stay here as long as she could get MRI in morning. Will discuss with hospitalist.   Pertinent labs & imaging results that were available during my care of the patient were reviewed by me and considered in my medical decision making (see chart for details).  ____________________________________________  FINAL CLINICAL IMPRESSION(S) / ED DIAGNOSES  Final diagnoses:  TIA (transient ischemic attack)  Cerebrovascular accident (CVA), unspecified mechanism ([place])     MEDICATIONS GIVEN DURING THIS VISIT:  Medications  aspirin tablet 325 mg (has no administration in time range)  atorvastatin (LIPITOR) tablet 20 mg (has no administration in time range)  diltiazem (DILACOR XR) 24 hr capsule 180 mg (has no administration in time range)  levothyroxine (SYNTHROID, LEVOTHROID) tablet 125  mcg (has no administration in time range)  pantoprazole (PROTONIX) EC tablet 40 mg (has no administration in time range)  senna-docusate (Senokot-S) tablet 2 tablet (has no administration in time range)  fesoterodine (TOVIAZ) tablet 4 mg (has no administration in time range)  clopidogrel (PLAVIX) tablet 75 mg (has no administration in time range)  multivitamin with minerals tablet 1 tablet (has no administration in time range)     NEW OUTPATIENT MEDICATIONS STARTED DURING THIS VISIT:  New Prescriptions   No medications on file    Note:  This note was prepared with assistance of Dragon voice recognition software. Occasional wrong-word or sound-a-like substitutions may have occurred due to the inherent limitations of voice recognition software.   , Corene Cornea, MD 11/01/17 [phone]

## 2017-11-01 NOTE — ED Notes (Signed)
Called [facility] for transport to [facility] [phone]).

## 2017-11-01 NOTE — ED Notes (Signed)
Pt given breakfast meal tray. 

## 2017-11-01 NOTE — Progress Notes (Signed)
PROGRESS NOTE  Angela Abbott HYI:[id number] DOB: 1960/08/19 DOA: 11/01/2017 PCP: Sharilyn Sites, MD  Brief History:  57 y/o female with history of recent stroke, HTN, hypothyroidism and tobacco abuse presenting with left facial numbness, left upper extremity numbness and left hand weakness that started a few minutes after MN on 11/01/17.  The patient was recently admiitted to [facility] from 10/27/2017 through 10/30/2017 during which the patient was treated for an acute stroke.  The patient was seen by the stroke service as well as vascular surgery.  The patient was noted to have right carotid stenosis with ultrasound showing 80-99% occlusion.  Dr. Trula Slade was planning to take the patient to surgery for right carotid endarterectomy on 11/05/2017.  The patient was discharged with aspirin, Plavix, and Lipitor.  She endorsed compliance with all her medications.  At the time of discharge, the patient was asymptomatic.  However she developed the symptoms as discussed above and activated EMS.  In the emergency department, the patient was afebrile hemodynamically stable saturating 97% on room air.  Patient stated that her symptoms have improved except for her word finding difficulties which have been stable since her discharge from the hospital on 10/30/2017.  CT of the brain did not show any acute findings.  EDP spoke with vascular surgery who felt that unless the patient had progressive symptoms or new findings on MRI, there would be no changes in treatment plan.  EDP also spoke with neurology who recommended MRI of the brain to clarify if there are new acute infarcts.  Because of lack of availability of MRI, the patient is being transferred to [facility].   Assessment/Plan: Sensory disturbance/left hemiparesis/Dysphasia -10/27/2017 MRV brain--acute and subacute infarcts of the right parietal and right occipital temporal lobes -10/28/2017-- LDL 20 -10/28/2017 hemoglobin A1c 5.9 -10/27/2017 MRV  brain--acute and subacute infarcts of the right parietal and right occipital temporal lobes -10/28/2017 carotid ultrasound--right ICA stenosis 80-99%; left ICA stenosis 40-59% -10/28/2017 echo EF 60-65%, grade 1 DD, no WMA, no thrombus -Transfer to [facility] for MRI and neurology consultation -Continue aspirin and Plavix -Continue Lipitor -repeat MRI brain and neurology consultation  Essential hypertension -Holding diltiazem to allow for permissive hypertension pending MRI results  Impaired glucose tolerance -10/28/2017 hemoglobin A1c 5.9 -Lifestyle modification  Hypothyroidism -Continue Synthroid  Tobacco abuse -nicoderm patch -pt has smoked 1 cigarette since 10/22 d/c I have discussed tobacco cessation with the patient.  I have counseled the patient regarding the negative impacts of continued tobacco use including but not limited to lung cancer, COPD, and cardiovascular disease.  I have discussed alternatives to tobacco and modalities that may help facilitate tobacco cessation including but not limited to biofeedback, hypnosis, and medications.  Total time spent with tobacco counseling was 4 minutes.    Disposition Plan:   Transfer to [facility] Family Communication:   Daughter at bedside updated 10/26--Total time spent 35 minutes.  Greater than 50% spent face to face counseling and coordinating care. 0650 to 58  Consultants:  Neurology, VVS  Code Status:  FULL  DVT Prophylaxis:  Hanna Heparin   Procedures: As Listed in Progress Note Above  Antibiotics: None    Subjective: Patient denies fevers, chills, headache, chest pain, dyspnea, nausea, vomiting, diarrhea, abdominal pain, dysuria, hematuria, hematochezia, and melena. She denies any headache, visual disturbance, focal extremity weakness, dysesthesias presently.  Her word finding difficulties have been stable.  Objective: Vitals:   11/01/17 0330 11/01/17 0400 11/01/17  0600 11/01/17 0628  BP: (!) 175/85 (!)  176/82 (!) 178/82   Pulse: 67 61 67   Resp: (!) 23 18 16    Temp:      TempSrc:      SpO2: 100% 100% 96% 98%  Weight:       No intake or output data in the 24 hours ending 11/01/17 0713 Weight change:  Exam:   General:  Pt is alert, follows commands appropriately, not in acute distress  HEENT: No icterus, No thrush, No neck mass, [place]/AT  Cardiovascular: RRR, S1/S2, no rubs, no gallops  Respiratory: CTA bilaterally, no wheezing, no crackles, no rhonchi  Abdomen: Soft/+BS, non tender, non distended, no guarding  Extremities: No edema, No lymphangitis, No petechiae, No rashes, no synovitis Neuro:  CN II-XII intact, strength 4/5 in RUE, RLE, strength 4/5 LUE, LLE; sensation intact bilateral; no dysmetria; babinski equivocal    Data Reviewed: I have personally reviewed following labs and imaging studies Basic Metabolic Panel: Recent Labs  Lab 10/27/17 1638 10/27/17 1659 11/01/17 0146 11/01/17 0209  NA 139 139 140 141  K 4.1 4.8 3.8 3.7  CL 105 106 104 103  CO2 26  --  29  --   GLUCOSE 92 91 98 96  BUN 12 21* 13 13  CREATININE 0.95 0.90 0.75 0.80  CALCIUM 9.2  --  9.4  --    Liver Function Tests: Recent Labs  Lab 10/27/17 1638 11/01/17 0146  AST 17 17  ALT 20 23  ALKPHOS 71 65  BILITOT 0.6 0.8  PROT 7.2 7.5  ALBUMIN 3.5 3.8   No results for input(s): LIPASE, AMYLASE in the last 168 hours. No results for input(s): AMMONIA in the last 168 hours. Coagulation Profile: Recent Labs  Lab 10/27/17 1638 11/01/17 0146  INR 0.98 0.94   CBC: Recent Labs  Lab 10/27/17 1638 10/27/17 1659 11/01/17 0146 11/01/17 0209  WBC 8.6  --  7.7  --   NEUTROABS 5.1  --  4.3  --   HGB 13.1 13.6 13.0 13.6  HCT 42.5 40.0 41.1 40.0  MCV 86.2  --  87.1  --   PLT 267  --  237  --    Cardiac Enzymes: Recent Labs  Lab 10/28/17 0437 10/28/17 0942  TROPONINI <0.03 <0.03   BNP: Invalid input(s): POCBNP CBG: No results for input(s): GLUCAP in the last 168  hours. HbA1C: No results for input(s): HGBA1C in the last 72 hours. Urine analysis:    Component Value Date/Time   COLORURINE YELLOW 10/28/2017 [place] 10/28/2017 0657   LABSPEC 1.034 (H) 10/28/2017 0657   PHURINE 7.0 10/28/2017 [place] 10/28/2017 0657   HGBUR NEGATIVE 10/28/2017 0657   BILIRUBINUR NEGATIVE 10/28/2017 0657   KETONESUR NEGATIVE 10/28/2017 0657   PROTEINUR NEGATIVE 10/28/2017 0657   UROBILINOGEN 0.2 07/07/2013 1432   NITRITE NEGATIVE 10/28/2017 0657   LEUKOCYTESUR NEGATIVE 10/28/2017 0657   Sepsis Labs: @LABRCNTIP (procalcitonin:4,lacticidven:4) ) Recent Results (from the past 240 hour(s))  Surgical pcr screen     Status: None   Collection Time: 10/31/17  2:27 PM  Result Value Ref Range Status   MRSA, PCR NEGATIVE NEGATIVE Final   Staphylococcus aureus NEGATIVE NEGATIVE Final    Comment: (NOTE) The Xpert SA Assay (FDA approved for NASAL specimens in patients 75 years of age and older), is one component of a comprehensive surveillance program. It is not intended to diagnose infection nor to guide or monitor treatment. Performed at  [facility], [place] [address] [place], [place] [address]      Scheduled Meds: . aspirin  325 mg Oral Daily  . atorvastatin  20 mg Oral q1800  . clopidogrel  75 mg Oral Daily  . diltiazem  180 mg Oral QHS  . fesoterodine  4 mg Oral Daily  . levothyroxine  125 mcg Oral QAC breakfast  . multivitamin with minerals  1 tablet Oral Daily  . pantoprazole  40 mg Oral Daily  . senna-docusate  2 tablet Oral Daily   Continuous Infusions:  Procedures/Studies: Ct Angio Head W Or Wo Contrast  Result Date: 10/28/2017 CLINICAL DATA:  Left hand and face intermittent numbness EXAM: CT ANGIOGRAPHY HEAD AND NECK TECHNIQUE: Multidetector CT imaging of the head and neck was performed using the standard protocol during bolus administration of intravenous contrast. Multiplanar CT image reconstructions and  MIPs were obtained to evaluate the vascular anatomy. Carotid stenosis measurements (when applicable) are obtained utilizing NASCET criteria, using the distal internal carotid diameter as the denominator. CONTRAST:  36mL ISOVUE-370 IOPAMIDOL (ISOVUE-370) INJECTION 76% COMPARISON:  None. FINDINGS: CTA NECK FINDINGS AORTIC ARCH: There is no calcific atherosclerosis of the aortic arch. There is no aneurysm, dissection or hemodynamically significant stenosis of the visualized ascending aorta and aortic arch. Conventional 3 vessel aortic branching pattern. The visualized proximal subclavian arteries are widely patent. RIGHT CAROTID SYSTEM: --Common carotid artery: Widely patent origin without common carotid artery dissection or aneurysm. --Internal carotid artery: No dissection, occlusion or aneurysm. Mixed calcified and non-calcified atherosclerotic disease at the bifurcation, extending into the internal carotid artery, resulting in 50% stenosis. --External carotid artery: No acute abnormality. LEFT CAROTID SYSTEM: --Common carotid artery: Widely patent origin without common carotid artery dissection or aneurysm. --Internal carotid artery:No dissection, occlusion or aneurysm. Mixed calcified and non-calcified atherosclerotic disease at the bifurcation, extending into the internal carotid artery, resulting in 20% stenosis. --External carotid artery: No acute abnormality. VERTEBRAL ARTERIES: Right dominant configuration. Both origins are normal. No dissection, occlusion or flow-limiting stenosis to the vertebrobasilar confluence. SKELETON: There is no bony spinal canal stenosis. No lytic or blastic lesion. OTHER NECK: Normal pharynx, larynx and major salivary glands. No cervical lymphadenopathy. Unremarkable thyroid gland. UPPER CHEST: No pneumothorax or pleural effusion. No nodules or masses. CTA HEAD FINDINGS ANTERIOR CIRCULATION: --Intracranial internal carotid arteries: Normal. --Anterior cerebral arteries: Normal.  Both A1 segments are present. Patent anterior communicating artery. --Middle cerebral arteries: Normal. --Posterior communicating arteries: Absent bilaterally. POSTERIOR CIRCULATION: --Basilar artery: Normal. --Posterior cerebral arteries: Short segment severe stenosis of the right PCA P2 segment. There is occlusion of the left PCA P2 segment. --Superior cerebellar arteries: Normal. --Inferior cerebellar arteries: Normal anterior and posterior inferior cerebellar arteries. VENOUS SINUSES: As permitted by contrast timing, patent. ANATOMIC VARIANTS: None DELAYED PHASE: No parenchymal contrast enhancement. Review of the MIP images confirms the above findings. IMPRESSION: 1. Occlusion of the midportion of the left PCA P2 segment and severe stenosis of the proximal right PCA P2 segment. 2. No other occlusion or hemodynamically significant stenosis of the intracranial arteries. 3. 50% stenosis of the proximal right ICA. 20% stenosis of the proximal left ICA. Electronically Signed   By: Ulyses Jarred M.D.   On: 10/28/2017 03:33   Dg Chest 2 View  Result Date: 10/27/2017 CLINICAL DATA:  CVA EXAM: CHEST - 2 VIEW COMPARISON:  03/14/2017 FINDINGS: The heart size and mediastinal contours are within normal limits. Both lungs are clear. Mild widening of the right [facility] joint with possible surgical  changes at the distal right clavicle. IMPRESSION: No active cardiopulmonary disease. Electronically Signed   By: Donavan Foil M.D.   On: 10/27/2017 23:45   Ct Head Wo Contrast  Result Date: 11/01/2017 CLINICAL DATA:  Numbness of the left hand, left side of face, and tongue EXAM: CT HEAD WITHOUT CONTRAST TECHNIQUE: Contiguous axial images were obtained from the base of the skull through the vertex without intravenous contrast. COMPARISON:  Head CT and brain MRI October 27, 2017 FINDINGS: Brain: The ventricles are normal in size and configuration. There is no appreciable intracranial mass, hemorrhage, extra-axial fluid collection,  or midline shift. There is a focal area of decreased attenuation in the superior right centrum semiovale, consistent with recent infarct noted in this area. There is subtle decreased attenuation in the posterosuperior right parietal lobe at the site of a recent prior infarct. Sites of other small recent infarcts in the right parietal lobe are not appreciable on this noncontrast enhanced study. Brain parenchyma elsewhere appears unremarkable. No new infarct is appreciable on noncontrast enhanced CT compared to recent studies. Vascular: No hyperdense vessel. There is calcification in each carotid siphon region. Skull: The bony calvarium appears intact. Sinuses/Orbits: There is opacification and mucosal thickening in several ethmoid air cells. Other visualized paranasal sinuses are clear. Visualized orbits appear symmetric bilaterally. Other: Visualized mastoid air cells are clear. IMPRESSION: Decreased attenuation at sites in the lateral superior right parietal lobe and superior posterior right centrum semiovale are consistent with recent infarcts demonstrated on recent MR. Several other small acute infarcts demonstrated on recent MR are not appreciable on this noncontrast enhanced CT examination. No acute infarct compared to recent prior study evident by CT. No mass or hemorrhage. There are foci of arterial vascular calcification. There is ethmoid sinus disease. Electronically Signed   By: Lowella Grip III M.D.   On: 11/01/2017 02:30   Ct Head Wo Contrast  Result Date: 10/27/2017 CLINICAL DATA:  Intermittent LEFT face and LEFT hand numbness for 2 days. EXAM: CT HEAD WITHOUT CONTRAST TECHNIQUE: Contiguous axial images were obtained from the base of the skull through the vertex without intravenous contrast. COMPARISON:  CT HEAD June 27, 2017 FINDINGS: BRAIN: No intraparenchymal hemorrhage, mass effect nor midline shift. The ventricles and sulci are normal. No acute large vascular territory infarcts.  Subcentimeter subtle RIGHT parietal hypodensity may be extra-axial (axial image 25). Basal cisterns are patent. VASCULAR: Trace calcific atherosclerosis carotid siphon. Mildly dense RIGHT cortical vein (likely [place]). SKULL/SOFT TISSUES: No skull fracture. No significant soft tissue swelling. ORBITS/SINUSES: The included ocular globes and orbital contents are normal.Trace paranasal sinus mucosal thickening. Mastoid air cells are well aerated. OTHER: None. IMPRESSION: Small RIGHT parietal RIGHT parietal subtle hypodensity may be extra-axial. Given adjacent dense cortical vein, venous infarct is possible versus small mass. Recommend MRI of the head with and without contrast and contrast enhanced MRV. Electronically Signed   By: Elon Alas M.D.   On: 10/27/2017 19:08   Ct Angio Neck W Or Wo Contrast  Result Date: 10/28/2017 CLINICAL DATA:  Left hand and face intermittent numbness EXAM: CT ANGIOGRAPHY HEAD AND NECK TECHNIQUE: Multidetector CT imaging of the head and neck was performed using the standard protocol during bolus administration of intravenous contrast. Multiplanar CT image reconstructions and MIPs were obtained to evaluate the vascular anatomy. Carotid stenosis measurements (when applicable) are obtained utilizing NASCET criteria, using the distal internal carotid diameter as the denominator. CONTRAST:  79mL ISOVUE-370 IOPAMIDOL (ISOVUE-370) INJECTION 76% COMPARISON:  None. FINDINGS: CTA NECK  FINDINGS AORTIC ARCH: There is no calcific atherosclerosis of the aortic arch. There is no aneurysm, dissection or hemodynamically significant stenosis of the visualized ascending aorta and aortic arch. Conventional 3 vessel aortic branching pattern. The visualized proximal subclavian arteries are widely patent. RIGHT CAROTID SYSTEM: --Common carotid artery: Widely patent origin without common carotid artery dissection or aneurysm. --Internal carotid artery: No dissection, occlusion or aneurysm. Mixed  calcified and non-calcified atherosclerotic disease at the bifurcation, extending into the internal carotid artery, resulting in 50% stenosis. --External carotid artery: No acute abnormality. LEFT CAROTID SYSTEM: --Common carotid artery: Widely patent origin without common carotid artery dissection or aneurysm. --Internal carotid artery:No dissection, occlusion or aneurysm. Mixed calcified and non-calcified atherosclerotic disease at the bifurcation, extending into the internal carotid artery, resulting in 20% stenosis. --External carotid artery: No acute abnormality. VERTEBRAL ARTERIES: Right dominant configuration. Both origins are normal. No dissection, occlusion or flow-limiting stenosis to the vertebrobasilar confluence. SKELETON: There is no bony spinal canal stenosis. No lytic or blastic lesion. OTHER NECK: Normal pharynx, larynx and major salivary glands. No cervical lymphadenopathy. Unremarkable thyroid gland. UPPER CHEST: No pneumothorax or pleural effusion. No nodules or masses. CTA HEAD FINDINGS ANTERIOR CIRCULATION: --Intracranial internal carotid arteries: Normal. --Anterior cerebral arteries: Normal. Both A1 segments are present. Patent anterior communicating artery. --Middle cerebral arteries: Normal. --Posterior communicating arteries: Absent bilaterally. POSTERIOR CIRCULATION: --Basilar artery: Normal. --Posterior cerebral arteries: Short segment severe stenosis of the right PCA P2 segment. There is occlusion of the left PCA P2 segment. --Superior cerebellar arteries: Normal. --Inferior cerebellar arteries: Normal anterior and posterior inferior cerebellar arteries. VENOUS SINUSES: As permitted by contrast timing, patent. ANATOMIC VARIANTS: None DELAYED PHASE: No parenchymal contrast enhancement. Review of the MIP images confirms the above findings. IMPRESSION: 1. Occlusion of the midportion of the left PCA P2 segment and severe stenosis of the proximal right PCA P2 segment. 2. No other occlusion  or hemodynamically significant stenosis of the intracranial arteries. 3. 50% stenosis of the proximal right ICA. 20% stenosis of the proximal left ICA. Electronically Signed   By: Ulyses Jarred M.D.   On: 10/28/2017 03:33   Mr Jeri Cos And Wo Contrast  Result Date: 10/27/2017 CLINICAL DATA:  56 y/o F; numbness in the left hand and left face intermittent since Saturday. Symptoms worse today. EXAM: MRI HEAD WITHOUT AND WITH CONTRAST MRV HEAD WITHOUT CONTRAST TECHNIQUE: Multiplanar, multiecho pulse sequences of the brain and surrounding structures were obtained without with intravenous contrast. Angiographic images of the intracranial venous structures were obtained using MRV technique without intravenous contrast. COMPARISON:  10/27/2017 CT head CONTRAST:  10 cc Gadavist FINDINGS: MR HEAD FINDINGS Brain: There several scattered foci of reduced diffusion in the right parietal lobe in the right occipitotemporal junction. Some of the lesions have variable diffusion, for example the right occipitotemporal lesion is hypointense on ADC (series 350, image 22) and the right postcentral gyrus lesion is mildly hypointense/near isointense on ADC (series 305, image 47). Additionally, some of the lesions enhance. Findings are consistent with mixed age acute and subacute infarctions. No focal mass effect, extra-axial collection, hydrocephalus, or herniation. A few punctate foci of susceptibility hypointensity are present in the right parietal lobe, discrete from areas of ischemia, compatible hemosiderin deposition of chronic microhemorrhage. Vascular: Normal flow voids. Skull and upper cervical spine: Normal marrow signal. Sinuses/Orbits: Mild mucosal thickening of the ethmoid and sphenoid sinuses. No abnormal signal of the mastoid air cells. Orbits are unremarkable. Other: None. MRV HEAD FINDINGS Normal flow related signal within the  superior sagittal sinus, internal cerebral veins, basal veins of Rosenthal, straight sinus,  bilateral transverse sinus, bilateral sigmoid sinus, and bilateral upper internal jugular veins. Additionally, there is normal flow related signal within the large cortical veins. No evidence of dural venous sinus thrombosis. IMPRESSION: 1. Several small scattered foci of acute and subacute infarction (mixed age) are present in the right parietal lobe and the right occipitotemporal junction. No acute hemorrhage. No significant mass effect. 2. Normal MRV.  No evidence of dural venous sinus thrombosis. These results were called by telephone at the time of interpretation on 10/27/2017 at 10:45 pm to Dr. Maryan Rued, who verbally acknowledged these results. Electronically Signed   By: Kristine Garbe M.D.   On: 10/27/2017 22:50   Mr Mrv Head Wo Cm  Result Date: 10/27/2017 CLINICAL DATA:  57 y/o F; numbness in the left hand and left face intermittent since Saturday. Symptoms worse today. EXAM: MRI HEAD WITHOUT AND WITH CONTRAST MRV HEAD WITHOUT CONTRAST TECHNIQUE: Multiplanar, multiecho pulse sequences of the brain and surrounding structures were obtained without with intravenous contrast. Angiographic images of the intracranial venous structures were obtained using MRV technique without intravenous contrast. COMPARISON:  10/27/2017 CT head CONTRAST:  10 cc Gadavist FINDINGS: MR HEAD FINDINGS Brain: There several scattered foci of reduced diffusion in the right parietal lobe in the right occipitotemporal junction. Some of the lesions have variable diffusion, for example the right occipitotemporal lesion is hypointense on ADC (series 350, image 22) and the right postcentral gyrus lesion is mildly hypointense/near isointense on ADC (series 305, image 47). Additionally, some of the lesions enhance. Findings are consistent with mixed age acute and subacute infarctions. No focal mass effect, extra-axial collection, hydrocephalus, or herniation. A few punctate foci of susceptibility hypointensity are present in the  right parietal lobe, discrete from areas of ischemia, compatible hemosiderin deposition of chronic microhemorrhage. Vascular: Normal flow voids. Skull and upper cervical spine: Normal marrow signal. Sinuses/Orbits: Mild mucosal thickening of the ethmoid and sphenoid sinuses. No abnormal signal of the mastoid air cells. Orbits are unremarkable. Other: None. MRV HEAD FINDINGS Normal flow related signal within the superior sagittal sinus, internal cerebral veins, basal veins of Rosenthal, straight sinus, bilateral transverse sinus, bilateral sigmoid sinus, and bilateral upper internal jugular veins. Additionally, there is normal flow related signal within the large cortical veins. No evidence of dural venous sinus thrombosis. IMPRESSION: 1. Several small scattered foci of acute and subacute infarction (mixed age) are present in the right parietal lobe and the right occipitotemporal junction. No acute hemorrhage. No significant mass effect. 2. Normal MRV.  No evidence of dural venous sinus thrombosis. These results were called by telephone at the time of interpretation on 10/27/2017 at 10:45 pm to Dr. Maryan Rued, who verbally acknowledged these results. Electronically Signed   By: Kristine Garbe M.D.   On: 10/27/2017 22:50    Orson Eva, DO  [facility] Pager [phone]  If 7PM-7AM, please contact night-coverage [email or web address] Password [email or web address] 11/01/2017, 7:13 AM   LOS: 0 days

## 2017-11-01 NOTE — ED Triage Notes (Signed)
Pt here for numbness of the left side of face, lt hand as well as the tongue. Pt was seen for the same on 10/27/17.

## 2017-11-02 DIAGNOSIS — R200 Anesthesia of skin: Secondary | ICD-10-CM

## 2017-11-02 DIAGNOSIS — G459 Transient cerebral ischemic attack, unspecified: Secondary | ICD-10-CM | POA: Diagnosis not present

## 2017-11-02 DIAGNOSIS — I10 Essential (primary) hypertension: Secondary | ICD-10-CM | POA: Diagnosis not present

## 2017-11-02 DIAGNOSIS — I639 Cerebral infarction, unspecified: Secondary | ICD-10-CM | POA: Diagnosis not present

## 2017-11-02 DIAGNOSIS — E038 Other specified hypothyroidism: Secondary | ICD-10-CM

## 2017-11-02 DIAGNOSIS — I6521 Occlusion and stenosis of right carotid artery: Secondary | ICD-10-CM | POA: Diagnosis not present

## 2017-11-02 LAB — HEPARIN LEVEL (UNFRACTIONATED): Heparin Unfractionated: 0.43 IU/mL (ref 0.30–0.70)

## 2017-11-02 MED ORDER — HEPARIN (PORCINE) IN NACL 100-0.45 UNIT/ML-% IJ SOLN
1200.0000 [IU]/h | INTRAMUSCULAR | Status: DC
  Administered 2017-11-02 – 2017-11-03 (×2): 1200 [IU]/h via INTRAVENOUS
  Administered 2017-11-04: 1100 [IU]/h via INTRAVENOUS
  Filled 2017-11-02 (×3): qty 250

## 2017-11-02 NOTE — Progress Notes (Signed)
PROGRESS NOTE    HOA BRIGGS  BZJ:[id number] DOB: 23-Mar-1960 DOA: 11/01/2017 PCP: Sharilyn Sites, MD    Brief Narrative:  Angela Doyle Carteris a 57 y.o.femalewith medical history significant of tobacco abuseon estrogen hormone,hypothyroidism, and obesity discharged from the hospital on 23 October after being evaluated for stroke comes back last night with recurrent symptoms of left upper extremity tingling and numbness with word finding difficulty and articulation.  She was readmitted for evaluation of new stroke.  MRI does not show any new stroke at this time.  Because of recurrent symptoms, vascular surgery consulted and she will be scheduled for carotid endarterectomy in the next 24 to 48 hours. Assessment & Plan:   Principal Problem:   Numbness Active Problems:   Cigarette smoker   Hypothyroidism   CVA (cerebral vascular accident) ([place])   TIA (transient ischemic attack)   Sensory disturbance   Acute ischemic stroke ([place])   Subacute ischemic right parietal and right occipital temporal junction CVA from high-grade stenosis of right ICA Continue with aspirin, Plavix and heparin infusion added as per vascular surgery recommendations. N.p.o. after midnight for carotid endarterectomy in the next 24 to 48 hours. Continue with statin.   Hypothyroidism Continue Synthroid   GERD continue with PPI   Hypertension Well controlled  DVT prophylaxis: On heparin Code Status: Full code Family Communication: Discussed with daughter at bedside Disposition Plan: (Pending further evaluation by vascular surgery.  Consultants:   Vascular surgery  Procedures: M RI of the brain  Antimicrobials: None   Subjective: Patient reports her symptoms of tingling and numbness of the left upper extremity and facial numbness and word finding difficulty have all resolved  Objective: Vitals:   11/01/17 1843 11/01/17 2144 11/02/17 0548 11/02/17 1437  BP: (!) 187/86 (!) 168/79 (!) 155/87  (!) 160/99  Pulse: 70 67 71 84  Resp:    20  Temp: 98.3 F (36.8 C) 97.9 F (36.6 C) 98.1 F (36.7 C) 99.5 F (37.5 C)  TempSrc:    Oral  SpO2: 98% 98% 96% 95%  Weight: 107.5 kg     Height: 5\' 6"  (1.676 m)       Intake/Output Summary (Last 24 hours) at 11/02/2017 1704 Last data filed at 11/02/2017 1500 Gross per 24 hour  Intake 862.01 ml  Output -  Net 862.01 ml   Filed Weights   11/01/17 0112 11/01/17 1843  Weight: 106.6 kg 107.5 kg    Examination:  General exam: Appears calm and comfortable  Respiratory system: Clear to auscultation. Respiratory effort normal. Cardiovascular system: S1 & S2 heard, RRR. No JVD,  No pedal edema. Gastrointestinal system: Abdomen is nondistended, soft and nontender. No organomegaly or masses felt. Normal bowel sounds heard. Central nervous system: Alert and oriented. No focal neurological deficits. Extremities: Symmetric 5 x 5 power. Skin: No rashes, lesions or ulcers Psychiatry: Mood & affect appropriate.     Data Reviewed: I have personally reviewed following labs and imaging studies  CBC: Recent Labs  Lab 10/27/17 1638 10/27/17 1659 11/01/17 0146 11/01/17 0209 11/01/17 1906  WBC 8.6  --  7.7  --  6.0  NEUTROABS 5.1  --  4.3  --   --   HGB 13.1 13.6 13.0 13.6 13.3  HCT 42.5 40.0 41.1 40.0 41.6  MCV 86.2  --  87.1  --  85.2  PLT 267  --  237  --  017   Basic Metabolic Panel: Recent Labs  Lab 10/27/17 1638 10/27/17 1659 11/01/17 0146 11/01/17  0209 11/01/17 1906  NA 139 139 140 141 139  K 4.1 4.8 3.8 3.7 4.4  CL 105 106 104 103 106  CO2 26  --  29  --  26  GLUCOSE 92 91 98 96 98  BUN 12 21* 13 13 9   CREATININE 0.95 0.90 0.75 0.80 0.84  CALCIUM 9.2  --  9.4  --  9.3   GFR: Estimated Creatinine Clearance: 90.6 mL/min (by C-G formula based on SCr of 0.84 mg/dL). Liver Function Tests: Recent Labs  Lab 10/27/17 1638 11/01/17 0146 11/01/17 1906  AST 17 17 16   ALT 20 23 20   ALKPHOS 71 65 70  BILITOT 0.6 0.8  0.8  PROT 7.2 7.5 6.9  ALBUMIN 3.5 3.8 3.5   No results for input(s): LIPASE, AMYLASE in the last 168 hours. No results for input(s): AMMONIA in the last 168 hours. Coagulation Profile: Recent Labs  Lab 10/27/17 1638 11/01/17 0146 11/01/17 1906  INR 0.98 0.94 0.97   Cardiac Enzymes: Recent Labs  Lab 10/28/17 0437 10/28/17 0942  TROPONINI <0.03 <0.03   BNP (last 3 results) No results for input(s): PROBNP in the last 8760 hours. HbA1C: No results for input(s): HGBA1C in the last 72 hours. CBG: No results for input(s): GLUCAP in the last 168 hours. Lipid Profile: No results for input(s): CHOL, HDL, LDLCALC, TRIG, CHOLHDL, LDLDIRECT in the last 72 hours. Thyroid Function Tests: No results for input(s): TSH, T4TOTAL, FREET4, T3FREE, THYROIDAB in the last 72 hours. Anemia Panel: No results for input(s): VITAMINB12, FOLATE, FERRITIN, TIBC, IRON, RETICCTPCT in the last 72 hours. Sepsis Labs: No results for input(s): PROCALCITON, LATICACIDVEN in the last 168 hours.  Recent Results (from the past 240 hour(s))  Surgical pcr screen     Status: None   Collection Time: 10/31/17  2:27 PM  Result Value Ref Range Status   MRSA, PCR NEGATIVE NEGATIVE Final   Staphylococcus aureus NEGATIVE NEGATIVE Final    Comment: (NOTE) The Xpert SA Assay (FDA approved for NASAL specimens in patients 29 years of age and older), is one component of a comprehensive surveillance program. It is not intended to diagnose infection nor to guide or monitor treatment. Performed at [facility], [place] [address] [place], [place] [address]          Radiology Studies: Ct Head Wo Contrast  Result Date: 11/01/2017 CLINICAL DATA:  Numbness of the left hand, left side of face, and tongue EXAM: CT HEAD WITHOUT CONTRAST TECHNIQUE: Contiguous axial images were obtained from the base of the skull through the vertex without intravenous contrast. COMPARISON:  Head CT and brain MRI October 27, 2017  FINDINGS: Brain: The ventricles are normal in size and configuration. There is no appreciable intracranial mass, hemorrhage, extra-axial fluid collection, or midline shift. There is a focal area of decreased attenuation in the superior right centrum semiovale, consistent with recent infarct noted in this area. There is subtle decreased attenuation in the posterosuperior right parietal lobe at the site of a recent prior infarct. Sites of other small recent infarcts in the right parietal lobe are not appreciable on this noncontrast enhanced study. Brain parenchyma elsewhere appears unremarkable. No new infarct is appreciable on noncontrast enhanced CT compared to recent studies. Vascular: No hyperdense vessel. There is calcification in each carotid siphon region. Skull: The bony calvarium appears intact. Sinuses/Orbits: There is opacification and mucosal thickening in several ethmoid air cells. Other visualized paranasal sinuses are clear. Visualized orbits appear symmetric bilaterally. Other: Visualized mastoid  air cells are clear. IMPRESSION: Decreased attenuation at sites in the lateral superior right parietal lobe and superior posterior right centrum semiovale are consistent with recent infarcts demonstrated on recent MR. Several other small acute infarcts demonstrated on recent MR are not appreciable on this noncontrast enhanced CT examination. No acute infarct compared to recent prior study evident by CT. No mass or hemorrhage. There are foci of arterial vascular calcification. There is ethmoid sinus disease. Electronically Signed   By: Lowella Grip III M.D.   On: 11/01/2017 02:30   Mr Brain Wo Contrast  Result Date: 11/01/2017 CLINICAL DATA:  Word-finding difficulties and left-sided numbness. EXAM: MRI HEAD WITHOUT CONTRAST TECHNIQUE: Multiplanar, multiecho pulse sequences of the brain and surrounding structures were obtained without intravenous contrast. COMPARISON:  Head CT 11/01/2017 Brain MRI  10/27/2017 FINDINGS: BRAIN: Expected fading of DWI hyperintensity at multiple locations of the right frontal lobe, right centrum semiovale and right occipital lobe. No new area of ischemia. The midline structures are normal. No midline shift or other mass effect. Mild edema at the sites of recent infarcts in the above-described locations. No hydrocephalus. The cerebral and cerebellar volume are age-appropriate. Susceptibility-sensitive sequences show no chronic microhemorrhage or superficial siderosis. VASCULAR: Major intracranial arterial and venous sinus flow voids are normal. SKULL AND UPPER CERVICAL SPINE: Calvarial bone marrow signal is normal. There is no skull base mass. Visualized upper cervical spine and soft tissues are normal. SINUSES/ORBITS: No fluid levels or advanced mucosal thickening. No mastoid or middle ear effusion. The orbits are normal. IMPRESSION: 1. Expected evolution of signal changes associated with multifocal ischemia of the right frontal lobe, right centrum semiovale and right occipital lobe. 2. No new site of ischemia or other acute abnormality. Electronically Signed   By: Ulyses Jarred M.D.   On: 11/01/2017 21:11        Scheduled Meds: . aspirin  325 mg Oral Daily  . atorvastatin  20 mg Oral q1800  . chlorhexidine  60 mL Topical Once   And  . chlorhexidine  60 mL Topical Once  . clopidogrel  75 mg Oral Daily  . fesoterodine  4 mg Oral Daily  . levothyroxine  125 mcg Oral QAC breakfast  . multivitamin with minerals  1 tablet Oral Daily  . nicotine  21 mg Transdermal Daily  . pantoprazole  40 mg Oral Daily  . senna-docusate  2 tablet Oral Daily  . sodium chloride flush  3 mL Intravenous Q12H   Continuous Infusions: . sodium chloride    . sodium chloride    .  ceFAZolin (ANCEF) IV    . heparin 1,200 Units/hr (11/02/17 1439)     LOS: 0 days    Time spent: 35 minutes    Hosie Poisson, MD [facility] Pager [phone] If 7PM-7AM, please contact  night-coverage [email or web address] Password [email or web address] 11/02/2017, 5:04 PM

## 2017-11-02 NOTE — Consult Note (Signed)
Hospital Consult    Reason for Consult:  TIA, known right ICA stenosis  Referring Physician:  Hospitalist MRN #:  [id number]  History of Present Illness: This is a 57 y.o. female with HTN, GERD, high grade R ICA stenosis (by duplex) that was previously evaluated by vascular and scheduled for right CEA.  Patient had an initial TIA event last Monday and was placed on dual antiplatelet therapy and scheduled for R CEA on Wednesday this week by Dr. Trula Slade.  She had another event on Friday night with left arm numbness and left facial numbness.  This has completely resolved at this time.  Her MRI showed no acute intra-cranial changes and felt to be consistent with TIA.  She has been taking her dual antiplatelet therapy.    Past Medical History:  Diagnosis Date  . Arthritis    shoulders, knees, hips  . Dental crowns present   . GERD (gastroesophageal reflux disease)   . Hypertension   . Hypothyroidism   . Left knee pain 07/2014  . Pneumonia    hx of 02/2017   . Right knee pain   . Stress incontinence   . Stroke [facility])    speech difficulty recalling words  . Wears partial dentures    upper and lower    Past Surgical History:  Procedure Laterality Date  . ABDOMINAL HYSTERECTOMY     partial  . [place]  . ESOPHAGOGASTRODUODENOSCOPY N/A 11/26/2012   Procedure: ESOPHAGOGASTRODUODENOSCOPY (EGD);  Surgeon: Shann Medal, MD;  Location: [facility];  Service: General;  Laterality: N/A;  . GANGLION CYST EXCISION Right 02/2008   foot  . KNEE ARTHROSCOPY WITH LATERAL MENISECTOMY Left 07/21/2014   Procedure: LEFT KNEE ARTHROSCOPY,  CHONDROPLASTY, WITH LATERAL AND MEDIAL  MENISCECTOMIES;  Surgeon: Kathryne Hitch, MD;  Location: [facility];  Service: Orthopedics;  Laterality: Left;  . KNEE ARTHROSCOPY WITH MEDIAL MENISECTOMY Left 07/21/2014   Procedure: KNEE ARTHROSCOPY WITH MEDIAL MENISECTOMY;  Surgeon: Kathryne Hitch, MD;  Location: [facility];   Service: Orthopedics;  Laterality: Left;  . LAPAROSCOPIC GASTRIC BANDING  02/18/2008  . LAPAROSCOPIC REPAIR AND REMOVAL OF GASTRIC BAND N/A 03/21/2017   Procedure: LAPAROSCOPIC REPAIR AND REMOVAL OF GASTRIC BAND;  Surgeon: [name], MD;  Location: [facility];  Service: General;  Laterality: N/A;  . PARTIAL HYSTERECTOMY  09/07/2002  . SHOULDER ARTHROSCOPY WITH SUBACROMIAL DECOMPRESSION, ROTATOR CUFF REPAIR AND BICEP TENDON REPAIR Right 11/23/2015   Procedure: RIGHT SHOULDER ARTHROSCOPY WITH DEBRIDEMENT, SUBACROMIAL DECOMPRESSION, DISTAL CLAVICLE EXCISION, ROTATOR CUFF REPAIR AND BICEP TENODESIS;  Surgeon: Ninetta Lights, MD;  Location: [facility];  Service: Orthopedics;  Laterality: Right;    Allergies  Allergen Reactions  . Bee Venom Shortness Of Breath and Swelling    Prior to Admission medications   Medication Sig Start Date End Date Taking? Authorizing Provider  acetaminophen (TYLENOL) 500 MG tablet Take 1,000 mg by mouth daily as needed for moderate pain or headache.   Yes [provider]  aspirin 325 MG tablet Take 1 tablet (325 mg total) by mouth daily. 10/29/17  Yes Hosie Poisson, MD  atorvastatin (LIPITOR) 20 MG tablet Take 1 tablet (20 mg total) by mouth daily at 6 PM. 10/29/17  Yes Hosie Poisson, MD  clopidogrel (PLAVIX) 75 MG tablet Take 1 tablet (75 mg total) by mouth daily. Use plavix for 3 weeks, followed by aspirin alone. 10/29/17  Yes Hosie Poisson, MD  diltiazem (DILACOR XR) 180 MG 24 hr capsule  Take 180 mg by mouth at bedtime.   Yes [provider]  levothyroxine (SYNTHROID, LEVOTHROID) 125 MCG tablet Take 125 mcg by mouth daily before breakfast.   Yes [provider]  Multiple Vitamin (MULTIVITAMIN WITH MINERALS) TABS tablet Take 1 tablet by mouth daily.   Yes [provider]  omeprazole (PRILOSEC) 40 MG capsule Take 1 capsule (40 mg total) by mouth daily. 02/15/17  Yes Kathie Dike, MD  senna-docusate (SENOKOT-S) 8.6-50 MG  tablet Take 1 tablet by mouth at bedtime as needed for mild constipation. Patient taking differently: Take 2 tablets by mouth daily.  10/29/17  Yes Hosie Poisson, MD  ciprofloxacin (CIPRO) 500 MG tablet ciprofloxacin 500 mg tablet    [provider]  diltiazem (CARDIZEM CD) 180 MG 24 hr capsule Take 1 capsule by mouth daily. 10/31/17   [provider]    Social History   Socioeconomic History  . Marital status: Divorced    Spouse name: Not on file  . Number of children: Not on file  . Years of education: Not on file  . Highest education level: Not on file  Occupational History  . Not on file  Social Needs  . Financial resource strain: Not on file  . Food insecurity:    Worry: Not on file    Inability: Not on file  . Transportation needs:    Medical: Not on file    Non-medical: Not on file  Tobacco Use  . Smoking status: Current Every Day Smoker    Packs/day: 1.00    Years: 35.00    Pack years: 35.00    Types: Cigarettes  . Smokeless tobacco: Never Used  . Tobacco comment: in process of quiting only 1 cigeratte today  Substance and Sexual Activity  . Alcohol use: Yes    Comment: occasionally  . Drug use: No  . Sexual activity: Not on file  Lifestyle  . Physical activity:    Days per week: Not on file    Minutes per session: Not on file  . Stress: Not on file  Relationships  . Social connections:    Talks on phone: Not on file    Gets together: Not on file    Attends religious service: Not on file    Active member of club or organization: Not on file    Attends meetings of clubs or organizations: Not on file    Relationship status: Not on file  . Intimate partner violence:    Fear of current or ex partner: Not on file    Emotionally abused: Not on file    Physically abused: Not on file    Forced sexual activity: Not on file  Other Topics Concern  . Not on file  Social History Narrative  . Not on file     Family History  Problem Relation  Age of Onset  . CVA Other   . Diabetes Other   . CAD Other   . CVA Mother   . Cancer Neg Hx     ROS: [x]  Positive   [ ]  Negative   [ ]  All sytems reviewed and are negative  Cardiovascular: []  chest pain/pressure []  palpitations []  SOB lying flat []  DOE []  pain in legs while walking []  pain in legs at rest []  pain in legs at night []  non-healing ulcers []  hx of DVT []  swelling in legs  Pulmonary: []  productive cough []  asthma/wheezing []  home O2  Neurologic: []  weakness in []  arms []  legs []   numbness in [x]  arms []  legs []  hx of CVA []  mini stroke [] difficulty speaking or slurred speech []  temporary loss of vision in one eye []  dizziness  Hematologic: []  hx of cancer []  bleeding problems []  problems with blood clotting easily  Endocrine:   []  diabetes []  thyroid disease  GI []  vomiting blood []  blood in stool  GU: []  CKD/renal failure []  HD--[]  M/W/F or []  T/T/S []  burning with urination []  blood in urine  Psychiatric: []  anxiety []  depression  Musculoskeletal: []  arthritis []  joint pain  Integumentary: []  rashes []  ulcers  Constitutional: []  fever []  chills   Physical Examination  Vitals:   11/01/17 2144 11/02/17 0548  BP: (!) 168/79 (!) 155/87  Pulse: 67 71  Resp:    Temp: 97.9 F (36.6 C) 98.1 F (36.7 C)  SpO2: 98% 96%   Body mass index is 38.25 kg/m.  General:  NAD Gait: Not observed HENT: WNL, normocephalic Pulmonary: normal non-labored breathing, without Rales, rhonchi,  wheezing Cardiac: regular, without  Murmurs, rubs or gallops Abdomen: soft, NT/ND, no masses Skin: without rashes Vascular Exam/Pulses:  Right Left  Radial 2+ (normal) 2+ (normal)  Ulnar    Femoral 2+ (normal) 2+ (normal)  Popliteal    DP 2+ (normal) 2+ (normal)  PT     Extremities: without ischemic changes, without Gangrene , without cellulitis; without open wounds;  Musculoskeletal: no muscle wasting or atrophy  Neurologic: A&O X 3;  Appropriate Affect ; SENSATION: normal; MOTOR FUNCTION:  moving all extremities equally. Speech is fluent/normal.  CN II-XII grossly intact   CBC    Component Value Date/Time   WBC 6.0 11/01/2017 1906   RBC 4.88 11/01/2017 1906   HGB 13.3 11/01/2017 1906   HCT 41.6 11/01/2017 1906   PLT 243 11/01/2017 1906   MCV 85.2 11/01/2017 1906   MCH 27.3 11/01/2017 1906   MCHC 32.0 11/01/2017 1906   RDW 14.9 11/01/2017 1906   LYMPHSABS 2.5 11/01/2017 0146   MONOABS 0.8 11/01/2017 0146   EOSABS 0.1 11/01/2017 0146   BASOSABS 0.0 11/01/2017 0146    BMET    Component Value Date/Time   NA 139 11/01/2017 1906   K 4.4 11/01/2017 1906   CL 106 11/01/2017 1906   CO2 26 11/01/2017 1906   GLUCOSE 98 11/01/2017 1906   BUN 9 11/01/2017 1906   CREATININE 0.84 11/01/2017 1906   CALCIUM 9.3 11/01/2017 1906   GFRNONAA >60 11/01/2017 1906   GFRAA >60 11/01/2017 1906    COAGS: Lab Results  Component Value Date   INR 0.97 11/01/2017   INR 0.94 11/01/2017   INR 0.98 10/27/2017     Non-Invasive Vascular Imaging:    Carotid duplex 80-99% stenosis on right, velocities 345/136;  L ICA 40-60% stenosis    ASSESSMENT/PLAN: This is a 57 y.o. female with symptomatic high grade R ICA stenosis.  Now with recurrent TIA symptom - totally resolved on evaluation and no more symptoms since Friday night.  Would recommend she stays in the hospital on dual antiplatelet therapy and would add heparin gtt until surgery this week.  Will need right carotid endarterectomy.  Currently scheduled for Wednesday with Dr. Trula Slade.  Please have her NPO after midnight in case we can add her on and get her carotid surgery done tomorrow.     Marty Heck, MD [facility] Office: [phone] Pager: [place]

## 2017-11-02 NOTE — Progress Notes (Addendum)
ANTICOAGULATION CONSULT NOTE - Follow Up Consult  Pharmacy Consult for heparin Indication: TIA  Allergies  Allergen Reactions  . Bee Venom Shortness Of Breath and Swelling    Patient Measurements: Height: 5\' 6"  (167.6 cm) Weight: 236 lb 15.9 oz (107.5 kg) IBW/kg (Calculated) : 59.3 Heparin Dosing Weight: 84kg  Vital Signs: Temp: 98.1 F (36.7 C) (10/27 0548) BP: 155/87 (10/27 0548) Pulse Rate: 71 (10/27 0548)  Labs: Recent Labs    11/01/17 0146 11/01/17 0209 11/01/17 1906  HGB 13.0 13.6 13.3  HCT 41.1 40.0 41.6  PLT 237  --  243  APTT 29  --  29  LABPROT 12.5  --  12.8  INR 0.94  --  0.97  CREATININE 0.75 0.80 0.84    Estimated Creatinine Clearance: 90.6 mL/min (by C-G formula based on SCr of 0.84 mg/dL).   Medications:  Medications Prior to Admission  Medication Sig Dispense Refill Last Dose  . acetaminophen (TYLENOL) 500 MG tablet Take 1,000 mg by mouth daily as needed for moderate pain or headache.   Past Week at Unknown time  . aspirin 325 MG tablet Take 1 tablet (325 mg total) by mouth daily. 30 tablet 0 10/31/2017 at Unknown time  . atorvastatin (LIPITOR) 20 MG tablet Take 1 tablet (20 mg total) by mouth daily at 6 PM. 30 tablet 1 10/31/2017 at Unknown time  . clopidogrel (PLAVIX) 75 MG tablet Take 1 tablet (75 mg total) by mouth daily. Use plavix for 3 weeks, followed by aspirin alone. 21 tablet 0 10/31/2017 at 0700  . diltiazem (DILACOR XR) 180 MG 24 hr capsule Take 180 mg by mouth at bedtime.   10/31/2017 at Unknown time  . levothyroxine (SYNTHROID, LEVOTHROID) 125 MCG tablet Take 125 mcg by mouth daily before breakfast.   10/31/2017 at Unknown time  . Multiple Vitamin (MULTIVITAMIN WITH MINERALS) TABS tablet Take 1 tablet by mouth daily.   10/31/2017 at Unknown time  . omeprazole (PRILOSEC) 40 MG capsule Take 1 capsule (40 mg total) by mouth daily. 30 capsule 0 10/31/2017 at Unknown time  . senna-docusate (SENOKOT-S) 8.6-50 MG tablet Take 1 tablet by mouth  at bedtime as needed for mild constipation. (Patient taking differently: Take 2 tablets by mouth daily. ) 30 tablet 0 10/31/2017 at Unknown time  . ciprofloxacin (CIPRO) 500 MG tablet ciprofloxacin 500 mg tablet   Completed Course at Unknown time  . diltiazem (CARDIZEM CD) 180 MG 24 hr capsule Take 1 capsule by mouth daily.      Scheduled:  . aspirin  325 mg Oral Daily  . atorvastatin  20 mg Oral q1800  . chlorhexidine  60 mL Topical Once   And  . chlorhexidine  60 mL Topical Once  . clopidogrel  75 mg Oral Daily  . fesoterodine  4 mg Oral Daily  . heparin injection (subcutaneous)  5,000 Units Subcutaneous Q8H  . levothyroxine  125 mcg Oral QAC breakfast  . multivitamin with minerals  1 tablet Oral Daily  . nicotine  21 mg Transdermal Daily  . pantoprazole  40 mg Oral Daily  . senna-docusate  2 tablet Oral Daily  . sodium chloride flush  3 mL Intravenous Q12H    Assessment: 57 yo with a hx of high grade ICA stenosis. She recently had a TIA event last Monday and was placed on dual antiplatelets therapy with ASA/Plavix. She had another event recently. VVS plan to do low dose heparin without bolus until carotid endarterectomy next week.  ICA stenosis. She recently had a TIA event last Monday and was placed on dual antiplatelets therapy with ASA/Plavix. She had another event recently. VVS plan to do low dose heparin without bolus until carotid endarterectomy next week. Monday and was placed on dual antiplatelets therapy with ASA/Plavix. She had another event recently. VVS plan to do low dose heparin without bolus until carotid endarterectomy next week.  Goal of Therapy:  Heparin level 0.3-0.5 Monitor platelets by anticoagulation protocol: Yes   Plan:   Dc SQ heparin Heparin infusion at 1200 units/hr F/u with 6 hr level Daily level and CBC  Onnie Boer, PharmD, BCIDP, AAHIVP, CPP Infectious Disease Pharmacist Pager: [phone] 11/02/2017 12:50 PM   Addendum:  Heparin came back therapeutic this PM. Cont same rate and confirm with level in AM.  Onnie Boer, PharmD, Jake Samples, AAHIVP, CPP Infectious Disease Pharmacist Pager: [phone] 11/02/2017 8:38 PM

## 2017-11-03 ENCOUNTER — Encounter (HOSPITAL_COMMUNITY): Payer: Self-pay | Admitting: General Practice

## 2017-11-03 DIAGNOSIS — E039 Hypothyroidism, unspecified: Secondary | ICD-10-CM | POA: Diagnosis present

## 2017-11-03 DIAGNOSIS — M160 Bilateral primary osteoarthritis of hip: Secondary | ICD-10-CM | POA: Diagnosis present

## 2017-11-03 DIAGNOSIS — I69328 Other speech and language deficits following cerebral infarction: Secondary | ICD-10-CM | POA: Diagnosis not present

## 2017-11-03 DIAGNOSIS — R7302 Impaired glucose tolerance (oral): Secondary | ICD-10-CM | POA: Diagnosis present

## 2017-11-03 DIAGNOSIS — Z7902 Long term (current) use of antithrombotics/antiplatelets: Secondary | ICD-10-CM | POA: Diagnosis not present

## 2017-11-03 DIAGNOSIS — G459 Transient cerebral ischemic attack, unspecified: Secondary | ICD-10-CM | POA: Diagnosis not present

## 2017-11-03 DIAGNOSIS — I10 Essential (primary) hypertension: Secondary | ICD-10-CM | POA: Diagnosis not present

## 2017-11-03 DIAGNOSIS — Z6837 Body mass index (BMI) 37.0-37.9, adult: Secondary | ICD-10-CM | POA: Diagnosis not present

## 2017-11-03 DIAGNOSIS — M19012 Primary osteoarthritis, left shoulder: Secondary | ICD-10-CM | POA: Diagnosis present

## 2017-11-03 DIAGNOSIS — Z823 Family history of stroke: Secondary | ICD-10-CM | POA: Diagnosis not present

## 2017-11-03 DIAGNOSIS — Z7982 Long term (current) use of aspirin: Secondary | ICD-10-CM | POA: Diagnosis not present

## 2017-11-03 DIAGNOSIS — M19011 Primary osteoarthritis, right shoulder: Secondary | ICD-10-CM | POA: Diagnosis present

## 2017-11-03 DIAGNOSIS — Z79890 Hormone replacement therapy (postmenopausal): Secondary | ICD-10-CM | POA: Diagnosis not present

## 2017-11-03 DIAGNOSIS — R200 Anesthesia of skin: Secondary | ICD-10-CM | POA: Diagnosis not present

## 2017-11-03 DIAGNOSIS — M19032 Primary osteoarthritis, left wrist: Secondary | ICD-10-CM | POA: Diagnosis present

## 2017-11-03 DIAGNOSIS — Z90710 Acquired absence of both cervix and uterus: Secondary | ICD-10-CM | POA: Diagnosis not present

## 2017-11-03 DIAGNOSIS — E038 Other specified hypothyroidism: Secondary | ICD-10-CM | POA: Diagnosis not present

## 2017-11-03 DIAGNOSIS — K219 Gastro-esophageal reflux disease without esophagitis: Secondary | ICD-10-CM | POA: Diagnosis not present

## 2017-11-03 DIAGNOSIS — Z91030 Bee allergy status: Secondary | ICD-10-CM | POA: Diagnosis not present

## 2017-11-03 DIAGNOSIS — M170 Bilateral primary osteoarthritis of knee: Secondary | ICD-10-CM | POA: Diagnosis present

## 2017-11-03 DIAGNOSIS — I6521 Occlusion and stenosis of right carotid artery: Secondary | ICD-10-CM | POA: Diagnosis not present

## 2017-11-03 DIAGNOSIS — I639 Cerebral infarction, unspecified: Secondary | ICD-10-CM | POA: Diagnosis not present

## 2017-11-03 DIAGNOSIS — F17210 Nicotine dependence, cigarettes, uncomplicated: Secondary | ICD-10-CM | POA: Diagnosis present

## 2017-11-03 DIAGNOSIS — E669 Obesity, unspecified: Secondary | ICD-10-CM | POA: Diagnosis present

## 2017-11-03 LAB — CBC
HCT: 39.7 % (ref 36.0–46.0)
Hemoglobin: 12.4 g/dL (ref 12.0–15.0)
MCH: 26.6 pg (ref 26.0–34.0)
MCHC: 31.2 g/dL (ref 30.0–36.0)
MCV: 85 fL (ref 80.0–100.0)
NRBC: 0 % (ref 0.0–0.2)
Platelets: 242 10*3/uL (ref 150–400)
RBC: 4.67 MIL/uL (ref 3.87–5.11)
RDW: 15.1 % (ref 11.5–15.5)
WBC: 7.8 10*3/uL (ref 4.0–10.5)

## 2017-11-03 LAB — HEPARIN LEVEL (UNFRACTIONATED)
HEPARIN UNFRACTIONATED: 0.7 [IU]/mL (ref 0.30–0.70)
Heparin Unfractionated: 0.6 IU/mL (ref 0.30–0.70)
Heparin Unfractionated: 0.28 IU/mL — ABNORMAL LOW (ref 0.30–0.70)

## 2017-11-03 NOTE — Progress Notes (Signed)
ANTICOAGULATION CONSULT NOTE - Follow Up Consult  Pharmacy Consult for heparin Indication: TIA  Allergies  Allergen Reactions  . Bee Venom Shortness Of Breath and Swelling    Patient Measurements: Height: 5\' 6"  (167.6 cm) Weight: 236 lb 8.9 oz (107.3 kg) IBW/kg (Calculated) : 59.3 Heparin Dosing Weight: 84kg    Vital Signs: Temp: 98.7 F (37.1 C) (10/28 1512) Temp Source: Oral (10/28 1512) BP: 173/77 (10/28 1512) Pulse Rate: 59 (10/28 1512)  Labs: Recent Labs    11/01/17 0146 11/01/17 0209 11/01/17 1906 11/02/17 1929 11/03/17 0421 11/03/17 1437  HGB 13.0 13.6 13.3  --  12.4  --   HCT 41.1 40.0 41.6  --  39.7  --   PLT 237  --  243  --  242  --   APTT 29  --  29  --   --   --   LABPROT 12.5  --  12.8  --   --   --   INR 0.94  --  0.97  --   --   --   HEPARINUNFRC  --   --   --  0.43 0.70 0.60  CREATININE 0.75 0.80 0.84  --   --   --     Estimated Creatinine Clearance: 90.5 mL/min (by C-G formula based on SCr of 0.84 mg/dL).  Assessment: 57 yo with a hx of high grade ICA stenosis. Pt placed on dual antiplatelets (ASA/Plavix) after TIA last week, subsequently had second TIA. VVS to do carotid endarterectomy 10/30. Pharmacy consulted to manage IV heparin.  Heparin level is above goal at 0.6 despite last rate decrease. No bleeding noted. No problems with infusion or bleeding per RN.  Goal of Therapy:  Heparin level 0.3-0.5 units/ml Monitor platelets by anticoagulation protocol: Yes   Plan:  Decrease heparin infusion to 900 units/hr 6 hr heparin level Daily heparin level and CBC Monitor for s/sx of bleeding   Renold Genta, PharmD, BCPS Clinical Pharmacist Clinical phone for 11/03/2017 until 10p is [phone] Please check AMION for all Pharmacist numbers by unit 11/03/2017 3:37 PM

## 2017-11-03 NOTE — Progress Notes (Signed)
PROGRESS NOTE    [name]  JHE:[id number] DOB: Aug 11, 1960 DOA: 11/01/2017 PCP: Sharilyn Sites, MD    Brief Narrative:  Angela Abbott Angela Abbott a 57 y.o.femalewith medical history significant of tobacco abuseon estrogen hormone,hypothyroidism, and obesity discharged from the hospital on 23 October after being evaluated for stroke comes back last night with recurrent symptoms of left upper extremity tingling and numbness with word finding difficulty and articulation.  She was readmitted for evaluation of new stroke.  MRI does not show any new stroke at this time.  Because of recurrent symptoms, vascular surgery consulted and she will be scheduled for carotid endarterectomy in the next 24 to 48 hours. Assessment & Plan:   Principal Problem:   Numbness Active Problems:   Cigarette smoker   Hypothyroidism   CVA (cerebral vascular accident) ([place])   TIA (transient ischemic attack)   Sensory disturbance   Acute ischemic stroke ([place])   Subacute ischemic right parietal and right occipital temporal junction CVA from high-grade stenosis of right ICA Continue with aspirin, Plavix and heparin infusion added as per vascular surgery recommendations. N.p.o. after midnight for carotid endarterectomy in the next 24 to 48 hours. Continue with statin. No new symptoms.    Hypothyroidism Continue Synthroid.    GERD continue with PPI   Hypertension Well controlled  DVT prophylaxis: On heparin Code Status: Full code Family Communication: Discussed with daughter at bedside Disposition Plan: Pending further evaluation by vascular surgery.  Consultants:  Vascular surgery [name].   Procedures:  MRI of the brain.  Antimicrobials: None   Subjective:  No tingling or numbness. No chest pain or sob.   Objective: Vitals:   11/02/17 2054 11/03/17 0534 11/03/17 1056 11/03/17 1512  BP: 138/82 (!) 150/85 (!) 157/71 (!) 173/77  Pulse: 74 (!) 59 60 (!) 59  Resp:  18 18 20   Temp: 98.9  F (37.2 C) 97.8 F (36.6 C) 97.8 F (36.6 C) 98.7 F (37.1 C)  TempSrc:  Oral Oral Oral  SpO2: 96% 96% 100% 100%  Weight:  107.3 kg    Height:        Intake/Output Summary (Last 24 hours) at 11/03/2017 1729 Last data filed at 11/03/2017 1300 Gross per 24 hour  Intake 118 ml  Output -  Net 118 ml   Filed Weights   11/01/17 0112 11/01/17 1843 11/03/17 0534  Weight: 106.6 kg 107.5 kg 107.3 kg    Examination:  General exam: Appears calm and comfortable, no distress.  Respiratory system: Clear to auscultation. Respiratory effort normal. No wheezing or rhonchi.  Cardiovascular system: S1 & S2 heard, RRR. No JVD,  No pedal edema. Gastrointestinal system: Abdomen is nondistended, soft and non tender,  Normal bowel sounds heard. Central nervous system: Alert and oriented. Non focal  Extremities: Symmetric 5 x 5 power. No edema, or cyanosis.  Skin: No rashes, lesions or ulcers Psychiatry: Mood & affect appropriate.     Data Reviewed: I have personally reviewed following labs and imaging studies  CBC: Recent Labs  Lab 11/01/17 0146 11/01/17 0209 11/01/17 1906 11/03/17 0421  WBC 7.7  --  6.0 7.8  NEUTROABS 4.3  --   --   --   HGB 13.0 13.6 13.3 12.4  HCT 41.1 40.0 41.6 39.7  MCV 87.1  --  85.2 85.0  PLT 237  --  243 185   Basic Metabolic Panel: Recent Labs  Lab 11/01/17 0146 11/01/17 0209 11/01/17 1906  NA 140 141 139  K 3.8 3.7 4.4  CL 104 103 106  CO2 29  --  26  GLUCOSE 98 96 98  BUN 13 13 9   CREATININE 0.75 0.80 0.84  CALCIUM 9.4  --  9.3   GFR: Estimated Creatinine Clearance: 90.5 mL/min (by C-G formula based on SCr of 0.84 mg/dL). Liver Function Tests: Recent Labs  Lab 11/01/17 0146 11/01/17 1906  AST 17 16  ALT 23 20  ALKPHOS 65 70  BILITOT 0.8 0.8  PROT 7.5 6.9  ALBUMIN 3.8 3.5   No results for input(s): LIPASE, AMYLASE in the last 168 hours. No results for input(s): AMMONIA in the last 168 hours. Coagulation Profile: Recent Labs  Lab  11/01/17 0146 11/01/17 1906  INR 0.94 0.97   Cardiac Enzymes: Recent Labs  Lab 10/28/17 0437 10/28/17 0942  TROPONINI <0.03 <0.03   BNP (last 3 results) No results for input(s): PROBNP in the last 8760 hours. HbA1C: No results for input(s): HGBA1C in the last 72 hours. CBG: No results for input(s): GLUCAP in the last 168 hours. Lipid Profile: No results for input(s): CHOL, HDL, LDLCALC, TRIG, CHOLHDL, LDLDIRECT in the last 72 hours. Thyroid Function Tests: No results for input(s): TSH, T4TOTAL, FREET4, T3FREE, THYROIDAB in the last 72 hours. Anemia Panel: No results for input(s): VITAMINB12, FOLATE, FERRITIN, TIBC, IRON, RETICCTPCT in the last 72 hours. Sepsis Labs: No results for input(s): PROCALCITON, LATICACIDVEN in the last 168 hours.  Recent Results (from the past 240 hour(s))  Surgical pcr screen     Status: None   Collection Time: 10/31/17  2:27 PM  Result Value Ref Range Status   MRSA, PCR NEGATIVE NEGATIVE Final   Staphylococcus aureus NEGATIVE NEGATIVE Final    Comment: (NOTE) The Xpert SA Assay (FDA approved for NASAL specimens in patients 41 years of age and older), is one component of a comprehensive surveillance program. It is not intended to diagnose infection nor to guide or monitor treatment. Performed at [facility], [place] [address] [place], [place] [address]          Radiology Studies: Mr Brain Wo Contrast  Result Date: 11/01/2017 CLINICAL DATA:  Word-finding difficulties and left-sided numbness. EXAM: MRI HEAD WITHOUT CONTRAST TECHNIQUE: Multiplanar, multiecho pulse sequences of the brain and surrounding structures were obtained without intravenous contrast. COMPARISON:  Head CT 11/01/2017 Brain MRI 10/27/2017 FINDINGS: BRAIN: Expected fading of DWI hyperintensity at multiple locations of the right frontal lobe, right centrum semiovale and right occipital lobe. No new area of ischemia. The midline structures are normal. No midline shift or  other mass effect. Mild edema at the sites of recent infarcts in the above-described locations. No hydrocephalus. The cerebral and cerebellar volume are age-appropriate. Susceptibility-sensitive sequences show no chronic microhemorrhage or superficial siderosis. VASCULAR: Major intracranial arterial and venous sinus flow voids are normal. SKULL AND UPPER CERVICAL SPINE: Calvarial bone marrow signal is normal. There is no skull base mass. Visualized upper cervical spine and soft tissues are normal. SINUSES/ORBITS: No fluid levels or advanced mucosal thickening. No mastoid or middle ear effusion. The orbits are normal. IMPRESSION: 1. Expected evolution of signal changes associated with multifocal ischemia of the right frontal lobe, right centrum semiovale and right occipital lobe. 2. No new site of ischemia or other acute abnormality. Electronically Signed   By: Ulyses Jarred M.D.   On: 11/01/2017 21:11        Scheduled Meds: . aspirin  325 mg Oral Daily  . atorvastatin  20 mg Oral q1800  . clopidogrel  75  mg Oral Daily  . fesoterodine  4 mg Oral Daily  . levothyroxine  125 mcg Oral QAC breakfast  . multivitamin with minerals  1 tablet Oral Daily  . nicotine  21 mg Transdermal Daily  . pantoprazole  40 mg Oral Daily  . senna-docusate  2 tablet Oral Daily  . sodium chloride flush  3 mL Intravenous Q12H   Continuous Infusions: . sodium chloride    . sodium chloride    .  ceFAZolin (ANCEF) IV    . heparin 900 Units/hr (11/03/17 1549)     LOS: 0 days    Time spent: 35 minutes    Hosie Poisson, MD [facility] Pager [phone] If 7PM-7AM, please contact night-coverage [email or web address] Password [email or web address] 11/03/2017, 5:29 PM

## 2017-11-03 NOTE — Progress Notes (Signed)
ANTICOAGULATION CONSULT NOTE - Follow Up Consult  Pharmacy Consult for Heparin Indication: TIA, high grade ICA stenosis  Allergies  Allergen Reactions  . Bee Venom Shortness Of Breath and Swelling    Patient Measurements: Height: 5\' 6"  (167.6 cm) Weight: 236 lb 8.9 oz (107.3 kg) IBW/kg (Calculated) : 59.3 Heparin Dosing Weight: 84kg    Vital Signs: Temp: 98.8 F (37.1 C) (10/28 2202) Temp Source: Oral (10/28 2202) BP: 176/90 (10/28 2202) Pulse Rate: 68 (10/28 2202)  Labs: Recent Labs    11/01/17 0146 11/01/17 0209 11/01/17 1906  11/03/17 0421 11/03/17 1437 11/03/17 2214  HGB 13.0 13.6 13.3  --  12.4  --   --   HCT 41.1 40.0 41.6  --  39.7  --   --   PLT 237  --  243  --  242  --   --   APTT 29  --  29  --   --   --   --   LABPROT 12.5  --  12.8  --   --   --   --   INR 0.94  --  0.97  --   --   --   --   HEPARINUNFRC  --   --   --    < > 0.70 0.60 0.28*  CREATININE 0.75 0.80 0.84  --   --   --   --    < > = values in this interval not displayed.    Estimated Creatinine Clearance: 90.5 mL/min (by C-G formula based on SCr of 0.84 mg/dL).  Assessment: 57 yo with a hx of high grade ICA stenosis. Pt placed on dual antiplatelets (ASA/Plavix) after TIA last week, subsequently had second TIA. VVS to do carotid endarterectomy 10/30. Pharmacy consulted to manage IV heparin.  10/28 PM update: heparin level just below goal tonight, no issues per RN .  Goal of Therapy:  Heparin level 0.3-0.5 units/ml Monitor platelets by anticoagulation protocol: Yes   Plan:  Inc heparin to 1000 units/hr Heparin level with AM labs Monitor for s/sx of bleeding  Narda Bonds, PharmD, BCPS Clinical Pharmacist Phone: [phone]

## 2017-11-03 NOTE — Care Management Note (Signed)
Case Management Note  Patient Details  Name: Angela Abbott MRN: [id number] Date of Birth: Dec 08, 1960  Subjective/Objective:    TIA               Action/Plan: Has private insurance with [facility] with prescription drug coverage; Outpatient Primary MD for the patient is Sharilyn Sites, MD; CM following for progression of care. Expected Discharge Date:   undetermined, plans for surgery 11/05/2017               Expected Discharge Plan:  Home/Self Care  Discharge planning Services  CM Consult  Status of Service:  In process, will continue to follow  Sherrilyn Rist [phone] 11/03/2017, 9:59 AM

## 2017-11-03 NOTE — Progress Notes (Signed)
ANTICOAGULATION CONSULT NOTE - Follow Up Consult  Pharmacy Consult for heparin Indication: TIA  Allergies  Allergen Reactions  . Bee Venom Shortness Of Breath and Swelling    Patient Measurements: Height: 5\' 6"  (167.6 cm) Weight: 236 lb 8.9 oz (107.3 kg) IBW/kg (Calculated) : 59.3 Heparin Dosing Weight: 84kg    Vital Signs: Temp: 97.8 F (36.6 C) (10/28 0534) Temp Source: Oral (10/28 0534) BP: 150/85 (10/28 0534) Pulse Rate: 59 (10/28 0534)  Labs: Recent Labs    11/01/17 0146 11/01/17 0209 11/01/17 1906 11/02/17 1929 11/03/17 0421  HGB 13.0 13.6 13.3  --  12.4  HCT 41.1 40.0 41.6  --  39.7  PLT 237  --  243  --  242  APTT 29  --  29  --   --   LABPROT 12.5  --  12.8  --   --   INR 0.94  --  0.97  --   --   HEPARINUNFRC  --   --   --  0.43 0.70  CREATININE 0.75 0.80 0.84  --   --     Estimated Creatinine Clearance: 90.5 mL/min (by C-G formula based on SCr of 0.84 mg/dL).  Assessment: 57 yo with a hx of high grade ICA stenosis. Pt placed on dual antiplatelets (ASA/Plavix) after TIA last week, subsequently had second TIA. VVS to do carotid endarterectomy 10/30. Pt currently on heparin without bolus. Pt's HL supratherapeutic at 0.70. CBC WNL, no bleeding noted.  Goal of Therapy:  Heparin level 0.3-0.5 Monitor platelets by anticoagulation protocol: Yes   Plan:  Decrease Heparin infusion to 1050 units/hr F/u with 6 hr level Daily level and CBC  Ladoris Gene, Student Pharm-D 11/03/2017 8:45 AM

## 2017-11-03 NOTE — Progress Notes (Signed)
Spoke to MD per OR nurse, to continue with heparin gtt and being NPO. OK to give ASA and Plavix.

## 2017-11-04 DIAGNOSIS — G459 Transient cerebral ischemic attack, unspecified: Secondary | ICD-10-CM

## 2017-11-04 LAB — CBC
HEMATOCRIT: 40.7 % (ref 36.0–46.0)
Hemoglobin: 13 g/dL (ref 12.0–15.0)
MCH: 27.1 pg (ref 26.0–34.0)
MCHC: 31.9 g/dL (ref 30.0–36.0)
MCV: 85 fL (ref 80.0–100.0)
Platelets: 236 10*3/uL (ref 150–400)
RBC: 4.79 MIL/uL (ref 3.87–5.11)
RDW: 14.8 % (ref 11.5–15.5)
WBC: 7 10*3/uL (ref 4.0–10.5)
nRBC: 0 % (ref 0.0–0.2)

## 2017-11-04 LAB — HEPARIN LEVEL (UNFRACTIONATED)
HEPARIN UNFRACTIONATED: 0.26 [IU]/mL — AB (ref 0.30–0.70)
Heparin Unfractionated: 0.28 IU/mL — ABNORMAL LOW (ref 0.30–0.70)

## 2017-11-04 MED ORDER — CHLORHEXIDINE GLUCONATE 4 % EX LIQD: Freq: Once | CUTANEOUS | Status: DC

## 2017-11-04 MED ORDER — CEFAZOLIN SODIUM-DEXTROSE 2-4 GM/100ML-% IV SOLN
2.0000 g | INTRAVENOUS | Status: DC
  Filled 2017-11-04: qty 100

## 2017-11-04 MED ORDER — CHLORHEXIDINE GLUCONATE CLOTH 2 % EX PADS
6.0000 | MEDICATED_PAD | Freq: Once | CUTANEOUS | Status: AC
  Administered 2017-11-05: 6 via TOPICAL

## 2017-11-04 MED ORDER — CHLORHEXIDINE GLUCONATE CLOTH 2 % EX PADS
6.0000 | MEDICATED_PAD | Freq: Once | CUTANEOUS | Status: AC
  Administered 2017-11-04: 6 via TOPICAL

## 2017-11-04 MED ORDER — CEFAZOLIN SODIUM-DEXTROSE 2-4 GM/100ML-% IV SOLN
2.0000 g | INTRAVENOUS | Status: AC
  Administered 2017-11-05: 2 g via INTRAVENOUS

## 2017-11-04 MED ORDER — HYDRALAZINE HCL 20 MG/ML IJ SOLN: 5.0000 mg | Freq: Three times a day (TID) | INTRAMUSCULAR | Status: DC | PRN

## 2017-11-04 NOTE — Progress Notes (Signed)
ANTICOAGULATION CONSULT NOTE - Follow Up Consult  Pharmacy Consult for Heparin Indication: TIA, high grade ICA stenosis  Allergies  Allergen Reactions  . Bee Venom Shortness Of Breath and Swelling    Patient Measurements: Height: 5\' 6"  (167.6 cm) Weight: 234 lb 2.1 oz (106.2 kg) IBW/kg (Calculated) : 59.3 Heparin Dosing Weight: 84kg    Vital Signs: Temp: 97.8 F (36.6 C) (10/29 0515) Temp Source: Oral (10/29 0515) BP: 181/71 (10/29 0515) Pulse Rate: 66 (10/29 0515)  Labs: Recent Labs    11/01/17 1906  11/03/17 0421 11/03/17 1437 11/03/17 2214 11/04/17 0521 11/04/17 0639  HGB 13.3  --  12.4  --   --  13.0  --   HCT 41.6  --  39.7  --   --  40.7  --   PLT 243  --  242  --   --  236  --   APTT 29  --   --   --   --   --   --   LABPROT 12.8  --   --   --   --   --   --   INR 0.97  --   --   --   --   --   --   HEPARINUNFRC  --    < > 0.70 0.60 0.28*  --  0.28*  CREATININE 0.84  --   --   --   --   --   --    < > = values in this interval not displayed.    Estimated Creatinine Clearance: 90 mL/min (by C-G formula based on SCr of 0.84 mg/dL).  Assessment: 57 yo with a hx of high grade ICA stenosis. Pt placed on dual antiplatelets (ASA/Plavix) after TIA last week, subsequently had second TIA. VVS to do carotid endarterectomy 10/30. Pharmacy consulted to manage IV heparin.  10/29 AM update: heparin level just below goal this AM despite rate increase, no issues per RN.  Goal of Therapy:  Heparin level 0.3-0.5 units/ml Monitor platelets by anticoagulation protocol: Yes   Plan:  Inc heparin to 1100 units/hr Heparin level in 8 hours Monitor for s/sx of bleeding  Narda Bonds, PharmD, BCPS Clinical Pharmacist Phone: [phone]

## 2017-11-04 NOTE — Progress Notes (Addendum)
    Patient states she has left fifth finger numbness. But currently no other symptoms.  She denise amaurosis, weakness of B UE, B LE, and no aphasia.    Carotid duplex 80-99% stenosis on right  Plan for Right ICA by Dr. Trula Slade 11/05/2017 Patient is in agreement.   Roxy Horseman PA-C   I agree with the above.  I seen and evaluated patient.  She is concerned because she feels like she has some cognitive issues and that she has difficulty finishing sentences sometimes.  She does not have any upper or lower extremity weakness.  She is continued on IV heparin drip.  Physical exam: Cardiovascular: Regular rhythm Neuro: No focal deficits Extremities: Warm and well-perfused  Plan: The patient is scheduled for a right carotid endarterectomy.  All of her questions were answered today.  Her operation will be scheduled for tomorrow morning.  I will stop her heparin drip on call to the operating room.  She will be n.p.o. after midnight.  Angela Abbott

## 2017-11-04 NOTE — H&P (View-Only) (Signed)
    Patient states she has left fifth finger numbness. But currently no other symptoms.  She denise amaurosis, weakness of B UE, B LE, and no aphasia.    Carotid duplex 80-99% stenosis on right  Plan for Right ICA by Dr. Trula Slade 11/05/2017 Patient is in agreement.   Roxy Horseman PA-C   I agree with the above.  I seen and evaluated patient.  She is concerned because she feels like she has some cognitive issues and that she has difficulty finishing sentences sometimes.  She does not have any upper or lower extremity weakness.  She is continued on IV heparin drip.  Physical exam: Cardiovascular: Regular rhythm Neuro: No focal deficits Extremities: Warm and well-perfused  Plan: The patient is scheduled for a right carotid endarterectomy.  All of her questions were answered today.  Her operation will be scheduled for tomorrow morning.  I will stop her heparin drip on call to the operating room.  She will be n.p.o. after midnight.  Angela Abbott

## 2017-11-04 NOTE — Progress Notes (Signed)
ANTICOAGULATION CONSULT NOTE - Follow Up Consult  Pharmacy Consult for Heparin Indication: TIA, high grade ICA stenosis  Allergies  Allergen Reactions  . Bee Venom Shortness Of Breath and Swelling    Patient Measurements: Height: 5\' 6"  (167.6 cm) Weight: 234 lb 2.1 oz (106.2 kg) IBW/kg (Calculated) : 59.3 Heparin Dosing Weight: 84kg    Vital Signs: Temp: 99.2 F (37.3 C) (10/29 1403) Temp Source: Oral (10/29 1403) BP: 158/82 (10/29 1403) Pulse Rate: 66 (10/29 1403)  Labs: Recent Labs    11/01/17 1906  11/03/17 0421  11/03/17 2214 11/04/17 0521 11/04/17 0639 11/04/17 1533  HGB 13.3  --  12.4  --   --  13.0  --   --   HCT 41.6  --  39.7  --   --  40.7  --   --   PLT 243  --  242  --   --  236  --   --   APTT 29  --   --   --   --   --   --   --   LABPROT 12.8  --   --   --   --   --   --   --   INR 0.97  --   --   --   --   --   --   --   HEPARINUNFRC  --    < > 0.70   < > 0.28*  --  0.28* 0.26*  CREATININE 0.84  --   --   --   --   --   --   --    < > = values in this interval not displayed.    Estimated Creatinine Clearance: 90 mL/min (by C-G formula based on SCr of 0.84 mg/dL).  Assessment: 57 yo with a hx of high grade ICA stenosis. Pt placed on dual antiplatelets (ASA/Plavix) after TIA last week, subsequently had second TIA. VVS to do carotid endarterectomy 10/30. Pharmacy consulted to manage IV heparin.  Heparin level is below goal at 0.26 and level decreased despite rate increase. Spoke with daughter and patient; phlebotomy drew from opposite arm. Verified pump rate. No bleeding noted.   Goal of Therapy:  Heparin level 0.3-0.5 units/ml Monitor platelets by anticoagulation protocol: Yes   Plan:  Increase heparin drip to 1200 units/hr Heparin level in am Daily heparin level and CBC Monitor for s/sx of bleeding   Renold Genta, PharmD, BCPS Clinical Pharmacist Clinical phone for 11/04/2017 until 10p is [phone] Please check AMION for all Pharmacist  numbers by unit 11/04/2017 6:21 PM

## 2017-11-04 NOTE — Progress Notes (Signed)
PROGRESS NOTE    Angela Abbott  OIN:[id number] DOB: 1960-07-24 DOA: 11/01/2017 PCP: Sharilyn Sites, MD    Brief Narrative:  Angela Abbott a 57 y.o.femalewith medical history significant of tobacco abuseon estrogen hormone,hypothyroidism, and obesity discharged from the hospital on 23 October after being evaluated for stroke comes back last night with recurrent symptoms of left upper extremity tingling and numbness with word finding difficulty and articulation.  She was readmitted for evaluation of new stroke.  MRI does not show any new stroke at this time.  Because of recurrent symptoms, vascular surgery consulted and she will be scheduled for carotid endarterectomy in the next 24 to 48 hours. Assessment & Plan:   Principal Problem:   Numbness Active Problems:   Cigarette smoker   Hypothyroidism   CVA (cerebral vascular accident) ([place])   TIA (transient ischemic attack)   Sensory disturbance   Acute ischemic stroke ([place])   Subacute ischemic right parietal and right occipital temporal junction CVA from high-grade stenosis of right ICA Continue with aspirin, Plavix and heparin infusion added as per vascular surgery recommendations. N.p.o. after midnight for carotid endarterectomy in the next 24 to 48 hours. Continue with statin. Some tingling of the left fingers , resolved at this time.    Hypothyroidism Continue Synthroid. No changes in meds.    GERD continue with PPI   Hypertension Sub optimal. Prn hydralazine ordered.   DVT prophylaxis: On heparin Code Status: Full code Family Communication: Discussed with daughter at bedside Disposition Plan: Pending further evaluation by vascular surgery.  Consultants:  Vascular surgery [name].   Procedures:  MRI of the brain.  Antimicrobials: None   Subjective:  NO CHEST PAIN OR SOB, no weakness or headache.   Objective: Vitals:   11/03/17 1512 11/03/17 2202 11/04/17 0515 11/04/17 1403  BP: (!) 173/77 (!)  176/90 (!) 181/71 (!) 158/82  Pulse: (!) 59 68 66 66  Resp: 20 16 16 18   Temp: 98.7 F (37.1 C) 98.8 F (37.1 C) 97.8 F (36.6 C) 99.2 F (37.3 C)  TempSrc: Oral Oral Oral Oral  SpO2: 100% 95% 98% 93%  Weight:   106.2 kg   Height:        Intake/Output Summary (Last 24 hours) at 11/04/2017 1405 Last data filed at 11/04/2017 1345 Gross per 24 hour  Intake 481.45 ml  Output -  Net 481.45 ml   Filed Weights   11/01/17 1843 11/03/17 0534 11/04/17 0515  Weight: 107.5 kg 107.3 kg 106.2 kg    Examination:  General exam: She does not appear to be in any distress Respiratory system:  good air entry bilateral no wheezing or rhonchi Cardiovascular system: S1 & S2 heard, RRR. No JVD,  No pedal edema. Gastrointestinal system: Abdomen is soft, nontender, nondistended with good bowel sounds Central nervous system: Alert and oriented.  No focal deficits evident Extremities: Symmetric 5 x 5 power. No edema, or cyanosis.  Skin: No rashes, lesions or ulcers Psychiatry: Mood & affect appropriate.     Data Reviewed: I have personally reviewed following labs and imaging studies  CBC: Recent Labs  Lab 11/01/17 0146 11/01/17 0209 11/01/17 1906 11/03/17 0421 11/04/17 0521  WBC 7.7  --  6.0 7.8 7.0  NEUTROABS 4.3  --   --   --   --   HGB 13.0 13.6 13.3 12.4 13.0  HCT 41.1 40.0 41.6 39.7 40.7  MCV 87.1  --  85.2 85.0 85.0  PLT 237  --  243 242 236  Basic Metabolic Panel: Recent Labs  Lab 11/01/17 0146 11/01/17 0209 11/01/17 1906  NA 140 141 139  K 3.8 3.7 4.4  CL 104 103 106  CO2 29  --  26  GLUCOSE 98 96 98  BUN 13 13 9   CREATININE 0.75 0.80 0.84  CALCIUM 9.4  --  9.3   GFR: Estimated Creatinine Clearance: 90 mL/min (by C-G formula based on SCr of 0.84 mg/dL). Liver Function Tests: Recent Labs  Lab 11/01/17 0146 11/01/17 1906  AST 17 16  ALT 23 20  ALKPHOS 65 70  BILITOT 0.8 0.8  PROT 7.5 6.9  ALBUMIN 3.8 3.5   No results for input(s): LIPASE, AMYLASE in the  last 168 hours. No results for input(s): AMMONIA in the last 168 hours. Coagulation Profile: Recent Labs  Lab 11/01/17 0146 11/01/17 1906  INR 0.94 0.97   Cardiac Enzymes: No results for input(s): CKTOTAL, CKMB, CKMBINDEX, TROPONINI in the last 168 hours. BNP (last 3 results) No results for input(s): PROBNP in the last 8760 hours. HbA1C: No results for input(s): HGBA1C in the last 72 hours. CBG: No results for input(s): GLUCAP in the last 168 hours. Lipid Profile: No results for input(s): CHOL, HDL, LDLCALC, TRIG, CHOLHDL, LDLDIRECT in the last 72 hours. Thyroid Function Tests: No results for input(s): TSH, T4TOTAL, FREET4, T3FREE, THYROIDAB in the last 72 hours. Anemia Panel: No results for input(s): VITAMINB12, FOLATE, FERRITIN, TIBC, IRON, RETICCTPCT in the last 72 hours. Sepsis Labs: No results for input(s): PROCALCITON, LATICACIDVEN in the last 168 hours.  Recent Results (from the past 240 hour(s))  Surgical pcr screen     Status: None   Collection Time: 10/31/17  2:27 PM  Result Value Ref Range Status   MRSA, PCR NEGATIVE NEGATIVE Final   Staphylococcus aureus NEGATIVE NEGATIVE Final    Comment: (NOTE) The Xpert SA Assay (FDA approved for NASAL specimens in patients 1 years of age and older), is one component of a comprehensive surveillance program. It is not intended to diagnose infection nor to guide or monitor treatment. Performed at [facility], [place] [address] [place], [place] [address]          Radiology Studies: No results found.      Scheduled Meds: . aspirin  325 mg Oral Daily  . atorvastatin  20 mg Oral q1800  . clopidogrel  75 mg Oral Daily  . fesoterodine  4 mg Oral Daily  . levothyroxine  125 mcg Oral QAC breakfast  . multivitamin with minerals  1 tablet Oral Daily  . nicotine  21 mg Transdermal Daily  . pantoprazole  40 mg Oral Daily  . senna-docusate  2 tablet Oral Daily  . sodium chloride flush  3 mL Intravenous Q12H    Continuous Infusions: . sodium chloride    . sodium chloride    . [START ON 11/05/2017]  ceFAZolin (ANCEF) IV    . heparin 1,100 Units/hr (11/04/17 1000)     LOS: 1 day    Time spent: 25 minutes    Hosie Poisson, MD [facility] Pager [phone] If 7PM-7AM, please contact night-coverage [email or web address] Password [email or web address] 11/04/2017, 2:05 PM

## 2017-11-05 ENCOUNTER — Encounter (HOSPITAL_COMMUNITY): Payer: Self-pay | Admitting: Surgery

## 2017-11-05 ENCOUNTER — Inpatient Hospital Stay (HOSPITAL_COMMUNITY): Payer: 59 | Admitting: Certified Registered Nurse Anesthetist

## 2017-11-05 ENCOUNTER — Inpatient Hospital Stay (HOSPITAL_COMMUNITY): Payer: 59 | Admitting: Physician Assistant

## 2017-11-05 ENCOUNTER — Inpatient Hospital Stay (HOSPITAL_COMMUNITY): Admission: RE | Admit: 2017-11-05 | Payer: 59 | Source: Ambulatory Visit | Admitting: Surgery

## 2017-11-05 ENCOUNTER — Encounter (HOSPITAL_COMMUNITY)
Admission: EM | Disposition: A | Discharge status: Home / Self Care | Payer: Self-pay | Source: Home / Self Care | Attending: Internal Medicine

## 2017-11-05 HISTORY — PX: ENDARTERECTOMY: SHX5162

## 2017-11-05 HISTORY — PX: PATCH ANGIOPLASTY: SHX6230

## 2017-11-05 LAB — HEPARIN LEVEL (UNFRACTIONATED): HEPARIN UNFRACTIONATED: 0.44 [IU]/mL (ref 0.30–0.70)

## 2017-11-05 LAB — CBC
HCT: 39.9 % (ref 36.0–46.0)
HEMOGLOBIN: 12.8 g/dL (ref 12.0–15.0)
MCH: 27.6 pg (ref 26.0–34.0)
MCHC: 32.1 g/dL (ref 30.0–36.0)
MCV: 86 fL (ref 80.0–100.0)
NRBC: 0 % (ref 0.0–0.2)
Platelets: 224 10*3/uL (ref 150–400)
RBC: 4.64 MIL/uL (ref 3.87–5.11)
RDW: 15.2 % (ref 11.5–15.5)
WBC: 7.2 10*3/uL (ref 4.0–10.5)

## 2017-11-05 SURGERY — ENDARTERECTOMY, CAROTID
Anesthesia: General | Site: Neck | Laterality: Right

## 2017-11-05 MED ORDER — GUAIFENESIN-DM 100-10 MG/5ML PO SYRP: 15.0000 mL | ORAL_SOLUTION | ORAL | Status: DC | PRN

## 2017-11-05 MED ORDER — ONDANSETRON HCL 4 MG/2ML IJ SOLN
4.0000 mg | Freq: Four times a day (QID) | INTRAMUSCULAR | Status: DC | PRN
  Administered 2017-11-05: 4 mg via INTRAVENOUS
  Filled 2017-11-05: qty 2

## 2017-11-05 MED ORDER — PROTAMINE SULFATE 10 MG/ML IV SOLN
INTRAVENOUS | Status: DC | PRN
  Administered 2017-11-05 (×3): 10 mg via INTRAVENOUS
  Administered 2017-11-05: 20 mg via INTRAVENOUS

## 2017-11-05 MED ORDER — SODIUM CHLORIDE 0.9 % IV SOLN: 500.0000 mL | Freq: Once | INTRAVENOUS | Status: DC | PRN

## 2017-11-05 MED ORDER — PROPOFOL 10 MG/ML IV BOLUS
INTRAVENOUS | Status: DC | PRN
  Administered 2017-11-05 (×4): 50 mg via INTRAVENOUS
  Administered 2017-11-05: 70 mg via INTRAVENOUS
  Administered 2017-11-05: 110 mg via INTRAVENOUS

## 2017-11-05 MED ORDER — OXYCODONE-ACETAMINOPHEN 5-325 MG PO TABS
1.0000 | ORAL_TABLET | ORAL | Status: DC | PRN
  Administered 2017-11-05: 2 via ORAL
  Administered 2017-11-06: 1 via ORAL
  Administered 2017-11-06: 2 via ORAL
  Filled 2017-11-05: qty 1
  Filled 2017-11-05 (×2): qty 2

## 2017-11-05 MED ORDER — NITROGLYCERIN 0.2 MG/ML ON CALL CATH LAB
INTRAVENOUS | Status: DC | PRN
  Administered 2017-11-05 (×5): 80 ug via INTRAVENOUS
  Administered 2017-11-05 (×2): 40 ug via INTRAVENOUS
  Administered 2017-11-05: 80 ug via INTRAVENOUS

## 2017-11-05 MED ORDER — MEPERIDINE HCL 50 MG/ML IJ SOLN: 6.2500 mg | INTRAMUSCULAR | Status: DC | PRN

## 2017-11-05 MED ORDER — PHENYLEPHRINE 40 MCG/ML (10ML) SYRINGE FOR IV PUSH (FOR BLOOD PRESSURE SUPPORT)
PREFILLED_SYRINGE | INTRAVENOUS | Status: DC | PRN
  Administered 2017-11-05: 80 ug via INTRAVENOUS

## 2017-11-05 MED ORDER — ONDANSETRON HCL 4 MG/2ML IJ SOLN: 4.0000 mg | Freq: Once | INTRAMUSCULAR | Status: DC | PRN

## 2017-11-05 MED ORDER — HYDROMORPHONE HCL 1 MG/ML IJ SOLN
0.2500 mg | INTRAMUSCULAR | Status: DC | PRN
  Administered 2017-11-05 (×2): 0.5 mg via INTRAVENOUS

## 2017-11-05 MED ORDER — DEXAMETHASONE SODIUM PHOSPHATE 10 MG/ML IJ SOLN
INTRAMUSCULAR | Status: DC | PRN
  Administered 2017-11-05: 10 mg via INTRAVENOUS

## 2017-11-05 MED ORDER — LABETALOL HCL 5 MG/ML IV SOLN
10.0000 mg | INTRAVENOUS | Status: DC | PRN
  Administered 2017-11-05: 10 mg via INTRAVENOUS

## 2017-11-05 MED ORDER — LIDOCAINE 2% (20 MG/ML) 5 ML SYRINGE
INTRAMUSCULAR | Status: DC | PRN
  Administered 2017-11-05: 100 mg via INTRAVENOUS

## 2017-11-05 MED ORDER — HEPARIN SODIUM (PORCINE) 1000 UNIT/ML IJ SOLN
INTRAMUSCULAR | Status: DC | PRN
  Administered 2017-11-05: 10000 [IU] via INTRAVENOUS
  Administered 2017-11-05 (×2): 1000 [IU] via INTRAVENOUS
  Administered 2017-11-05: 2000 [IU] via INTRAVENOUS

## 2017-11-05 MED ORDER — PHENOL 1.4 % MT LIQD: 1.0000 | OROMUCOSAL | Status: DC | PRN

## 2017-11-05 MED ORDER — MIDAZOLAM HCL 2 MG/2ML IJ SOLN
INTRAMUSCULAR | Status: DC | PRN
  Administered 2017-11-05: 2 mg via INTRAVENOUS

## 2017-11-05 MED ORDER — HEPARIN SODIUM (PORCINE) 5000 UNIT/ML IJ SOLN
5000.0000 [IU] | Freq: Three times a day (TID) | INTRAMUSCULAR | Status: DC
  Administered 2017-11-06: 5000 [IU] via SUBCUTANEOUS
  Filled 2017-11-05: qty 1

## 2017-11-05 MED ORDER — 0.9 % SODIUM CHLORIDE (POUR BTL) OPTIME
TOPICAL | Status: DC | PRN
  Administered 2017-11-05: 1000 mL

## 2017-11-05 MED ORDER — EPHEDRINE SULFATE-NACL 50-0.9 MG/10ML-% IV SOSY
PREFILLED_SYRINGE | INTRAVENOUS | Status: DC | PRN
  Administered 2017-11-05 (×5): 5 mg via INTRAVENOUS

## 2017-11-05 MED ORDER — SUGAMMADEX SODIUM 200 MG/2ML IV SOLN
INTRAVENOUS | Status: DC | PRN
  Administered 2017-11-05: 400 mg via INTRAVENOUS

## 2017-11-05 MED ORDER — LABETALOL HCL 5 MG/ML IV SOLN
INTRAVENOUS | Status: AC
  Filled 2017-11-05: qty 4

## 2017-11-05 MED ORDER — SODIUM CHLORIDE 0.9 % IV SOLN
INTRAVENOUS | Status: DC | PRN
  Administered 2017-11-05: 15 ug/min via INTRAVENOUS

## 2017-11-05 MED ORDER — METOPROLOL TARTRATE 5 MG/5ML IV SOLN: 2.0000 mg | INTRAVENOUS | Status: DC | PRN

## 2017-11-05 MED ORDER — LACTATED RINGERS IV SOLN
INTRAVENOUS | Status: DC | PRN
  Administered 2017-11-05 (×3): via INTRAVENOUS

## 2017-11-05 MED ORDER — MORPHINE SULFATE (PF) 2 MG/ML IV SOLN
2.0000 mg | INTRAVENOUS | Status: AC | PRN
  Administered 2017-11-05: 2 mg via INTRAVENOUS
  Filled 2017-11-05: qty 1

## 2017-11-05 MED ORDER — HYDROMORPHONE HCL 1 MG/ML IJ SOLN
INTRAMUSCULAR | Status: AC
  Filled 2017-11-05: qty 1

## 2017-11-05 MED ORDER — SODIUM CHLORIDE 0.9 % IV SOLN
INTRAVENOUS | Status: DC | PRN
  Administered 2017-11-05: 500 mL

## 2017-11-05 MED ORDER — SODIUM CHLORIDE 0.9 % IV SOLN: INTRAVENOUS | Status: DC

## 2017-11-05 MED ORDER — LEVOTHYROXINE SODIUM 100 MCG IV SOLR
62.5000 ug | Freq: Once | INTRAVENOUS | Status: AC
  Administered 2017-11-05: 62.5 ug via INTRAVENOUS
  Filled 2017-11-05: qty 5

## 2017-11-05 MED ORDER — ALUM & MAG HYDROXIDE-SIMETH 200-200-20 MG/5ML PO SUSP: 15.0000 mL | ORAL | Status: DC | PRN

## 2017-11-05 MED ORDER — ONDANSETRON HCL 4 MG/2ML IJ SOLN
INTRAMUSCULAR | Status: DC | PRN
  Administered 2017-11-05: 4 mg via INTRAVENOUS

## 2017-11-05 MED ORDER — DOCUSATE SODIUM 100 MG PO CAPS
100.0000 mg | ORAL_CAPSULE | Freq: Every day | ORAL | Status: DC
  Administered 2017-11-06: 100 mg via ORAL
  Filled 2017-11-05: qty 1

## 2017-11-05 MED ORDER — ROCURONIUM BROMIDE 10 MG/ML (PF) SYRINGE
PREFILLED_SYRINGE | INTRAVENOUS | Status: DC | PRN
  Administered 2017-11-05 (×2): 25 mg via INTRAVENOUS
  Administered 2017-11-05: 50 mg via INTRAVENOUS
  Administered 2017-11-05 (×2): 25 mg via INTRAVENOUS

## 2017-11-05 MED ORDER — FENTANYL CITRATE (PF) 250 MCG/5ML IJ SOLN
INTRAMUSCULAR | Status: DC | PRN
  Administered 2017-11-05 (×2): 50 ug via INTRAVENOUS
  Administered 2017-11-05: 150 ug via INTRAVENOUS
  Administered 2017-11-05: 50 ug via INTRAVENOUS

## 2017-11-05 MED ORDER — CEFAZOLIN SODIUM-DEXTROSE 2-4 GM/100ML-% IV SOLN
2.0000 g | Freq: Three times a day (TID) | INTRAVENOUS | Status: AC
  Administered 2017-11-05 – 2017-11-06 (×2): 2 g via INTRAVENOUS
  Filled 2017-11-05 (×2): qty 100

## 2017-11-05 MED ORDER — LABETALOL HCL 5 MG/ML IV SOLN
INTRAVENOUS | Status: DC | PRN
  Administered 2017-11-05 (×2): 10 mg via INTRAVENOUS

## 2017-11-05 SURGICAL SUPPLY — 52 items
ADH SKN CLS APL DERMABOND .7 (GAUZE/BANDAGES/DRESSINGS) ×1
CANISTER SUCT 3000ML PPV (MISCELLANEOUS) ×2 IMPLANT
CATH ROBINSON RED A/P 18FR (CATHETERS) ×2 IMPLANT
CATH SUCT 10FR WHISTLE TIP (CATHETERS) ×2 IMPLANT
CLIP VESOCCLUDE MED 6/CT (CLIP) ×4 IMPLANT
CLIP VESOCCLUDE SM WIDE 24/CT (CLIP) ×1 IMPLANT
CLIP VESOCCLUDE SM WIDE 6/CT (CLIP) ×3 IMPLANT
COVER PROBE W GEL 5X96 (DRAPES) IMPLANT
COVER WAND RF STERILE (DRAPES) ×2 IMPLANT
CRADLE DONUT ADULT HEAD (MISCELLANEOUS) ×2 IMPLANT
DERMABOND ADVANCED (GAUZE/BANDAGES/DRESSINGS) ×1
DERMABOND ADVANCED .7 DNX12 (GAUZE/BANDAGES/DRESSINGS) ×1 IMPLANT
DRAIN CHANNEL 15F RND FF W/TCR (WOUND CARE) IMPLANT
DRAPE ORTHO SPLIT 77X108 STRL (DRAPES) ×2
DRAPE SURG ORHT 6 SPLT 77X108 (DRAPES) IMPLANT
ELECT REM PT RETURN 9FT ADLT (ELECTROSURGICAL) ×2
ELECTRODE REM PT RTRN 9FT ADLT (ELECTROSURGICAL) ×1 IMPLANT
EVACUATOR SILICONE 100CC (DRAIN) IMPLANT
FELT TEFLON 1X6 (MISCELLANEOUS) ×2 IMPLANT
GLOVE BIOGEL PI IND STRL 7.5 (GLOVE) ×1 IMPLANT
GLOVE BIOGEL PI INDICATOR 7.5 (GLOVE) ×1
GLOVE SURG SS PI 7.5 STRL IVOR (GLOVE) ×2 IMPLANT
GOWN STRL REUS W/ TWL LRG LVL3 (GOWN DISPOSABLE) ×2 IMPLANT
GOWN STRL REUS W/ TWL XL LVL3 (GOWN DISPOSABLE) ×1 IMPLANT
GOWN STRL REUS W/TWL LRG LVL3 (GOWN DISPOSABLE) ×4
GOWN STRL REUS W/TWL XL LVL3 (GOWN DISPOSABLE) ×2
HEMOSTAT SNOW SURGICEL 2X4 (HEMOSTASIS) IMPLANT
INSERT FOGARTY SM (MISCELLANEOUS) IMPLANT
KIT BASIN OR (CUSTOM PROCEDURE TRAY) ×2 IMPLANT
KIT SHUNT ARGYLE CAROTID ART 6 (VASCULAR PRODUCTS) IMPLANT
KIT TURNOVER KIT B (KITS) ×2 IMPLANT
NDL HYPO 25GX1X1/2 BEV (NEEDLE) IMPLANT
NEEDLE HYPO 25GX1X1/2 BEV (NEEDLE) IMPLANT
NS IRRIG 1000ML POUR BTL (IV SOLUTION) ×6 IMPLANT
PACK CAROTID (CUSTOM PROCEDURE TRAY) ×2 IMPLANT
PAD ARMBOARD 7.5X6 YLW CONV (MISCELLANEOUS) ×4 IMPLANT
PATCH VASC XENOSURE 1CMX6CM (Vascular Products) ×4 IMPLANT
PATCH VASC XENOSURE 1X6 (Vascular Products) IMPLANT
SHUNT CAROTID BYPASS 10 (VASCULAR PRODUCTS) IMPLANT
SHUNT CAROTID BYPASS 12FRX15.5 (VASCULAR PRODUCTS) IMPLANT
SUT ETHILON 3 0 PS 1 (SUTURE) IMPLANT
SUT PROLENE 6 0 BV (SUTURE) ×7 IMPLANT
SUT PROLENE 7 0 BV 1 (SUTURE) IMPLANT
SUT PROLENE 7 0 BV1 MDA (SUTURE) ×1 IMPLANT
SUT SILK 3 0 (SUTURE) ×2
SUT SILK 3-0 18XBRD TIE 12 (SUTURE) IMPLANT
SUT VIC AB 3-0 SH 27 (SUTURE) ×4
SUT VIC AB 3-0 SH 27X BRD (SUTURE) ×2 IMPLANT
SUT VICRYL 4-0 PS2 18IN ABS (SUTURE) ×2 IMPLANT
SYR CONTROL 10ML LL (SYRINGE) IMPLANT
TOWEL GREEN STERILE (TOWEL DISPOSABLE) ×2 IMPLANT
WATER STERILE IRR 1000ML POUR (IV SOLUTION) ×2 IMPLANT

## 2017-11-05 NOTE — Anesthesia Preprocedure Evaluation (Signed)
Anesthesia Evaluation  Patient identified by MRN, date of birth, ID band Patient awake    Reviewed: Allergy & Precautions, NPO status , Patient's Chart, lab work & pertinent test results  Airway Mallampati: I  TM Distance: >3 FB Neck ROM: Full    Dental   Pulmonary Current Smoker,    Pulmonary exam normal        Cardiovascular hypertension, Pt. on medications Normal cardiovascular exam     Neuro/Psych TIACVA    GI/Hepatic GERD  Medicated and Controlled,  Endo/Other    Renal/GU      Musculoskeletal   Abdominal   Peds  Hematology   Anesthesia Other Findings   Reproductive/Obstetrics                             Anesthesia Physical Anesthesia Plan  ASA: III  Anesthesia Plan: General   Post-op Pain Management:    Induction: Intravenous  PONV Risk Score and Plan: 2 and Ondansetron, Midazolam and Treatment may vary due to age or medical condition  Airway Management Planned: Oral ETT  Additional Equipment:   Intra-op Plan:   Post-operative Plan: Extubation in OR  Informed Consent: I have reviewed the patients History and Physical, chart, labs and discussed the procedure including the risks, benefits and alternatives for the proposed anesthesia with the patient or authorized representative who has indicated his/her understanding and acceptance.     Plan Discussed with: CRNA and Surgeon  Anesthesia Plan Comments:         Anesthesia Quick Evaluation

## 2017-11-05 NOTE — Interval H&P Note (Signed)
History and Physical Interval Note:  11/05/2017 7:16 AM  Angela Abbott  has presented today for surgery, with the diagnosis of RIGHT CAROTID STENOSIS  The various methods of treatment have been discussed with the patient and family. After consideration of risks, benefits and other options for treatment, the patient has consented to  Procedure(s): ENDARTERECTOMY CAROTID RIGHT (Right) as a surgical intervention .  The patient's history has been reviewed, patient examined, no change in status, stable for surgery.  I have reviewed the patient's chart and labs.  Questions were answered to the patient's satisfaction.     Annamarie Major

## 2017-11-05 NOTE — Discharge Instructions (Signed)
   [facility]  Discharge Instructions   Carotid Endarterectomy (CEA)  Please refer to the following instructions for your post-procedure care. Your surgeon or physician assistant will discuss any changes with you.  Activity  You are encouraged to walk as much as you can. You can slowly return to normal activities but must avoid strenuous activity and heavy lifting until your doctor tell you it's OK. Avoid activities such as vacuuming or swinging a golf club. You can drive after one week if you are comfortable and you are no longer taking prescription pain medications. It is normal to feel tired for serval weeks after your surgery. It is also normal to have difficulty with sleep habits, eating, and bowel movements after surgery. These will go away with time.  Bathing/Showering  You may shower after you come home. Do not soak in a bathtub, hot tub, or swim until the incision heals completely.  Incision Care  Shower every day. Clean your incision with mild soap and water. Pat the area dry with a clean towel. You do not need a bandage unless otherwise instructed. Do not apply any ointments or creams to your incision. You may have skin glue on your incision. Do not peel it off. It will come off on its own in about one week. Your incision may feel thickened and raised for several weeks after your surgery. This is normal and the skin will soften over time. For Men Only: It's OK to shave around the incision but do not shave the incision itself for 2 weeks. It is common to have numbness under your chin that could last for several months.  Diet  Resume your normal diet. There are no special food restrictions following this procedure. A low fat/low cholesterol diet is recommended for all patients with vascular disease. In order to heal from your surgery, it is CRITICAL to get adequate nutrition. Your body requires vitamins, minerals, and protein. Vegetables are the best  source of vitamins and minerals. Vegetables also provide the perfect balance of protein. Processed food has little nutritional value, so try to avoid this.        Medications  Resume taking all of your medications unless your doctor or physician assistant tells you not to. If your incision is causing pain, you may take over-the- counter pain relievers such as acetaminophen (Tylenol). If you were prescribed a stronger pain medication, please be aware these medications can cause nausea and constipation. Prevent nausea by taking the medication with a snack or meal. Avoid constipation by drinking plenty of fluids and eating foods with a high amount of fiber, such as fruits, vegetables, and grains. Do not take Tylenol if you are taking prescription pain medications.  Follow Up  Our office will schedule a follow up appointment 2-3 weeks following discharge.  Please call us immediately for any of the following conditions  Increased pain, redness, drainage (pus) from your incision site. Fever of 101 degrees or higher. If you should develop stroke (slurred speech, difficulty swallowing, weakness on one side of your body, loss of vision) you should call 911 and go to the nearest emergency room.  Reduce your risk of vascular disease:  Stop smoking. If you would like help call [facility] at 1-800-QUIT-NOW (1-800-784-8669) or [place] at [phone]. Manage your cholesterol Maintain a desired weight Control your diabetes Keep your blood pressure down  If you have any questions, please call the office at [phone].   

## 2017-11-05 NOTE — Op Note (Signed)
Patient name: Angela Abbott MRN: [id number] DOB: [birth year] Sex: female  11/05/2017 Pre-operative Diagnosis: Symptomatic   right carotid stenosis Post-operative diagnosis:  Same Surgeon:  Annamarie Major Assistants: Laurence Slate, Arlee Muslim Procedure:    right carotid Endarterectomy with bovine pericardial  patch angioplasty Anesthesia:  General Blood Loss:  600 Specimens: None  Findings: 75 %stenosis; Thrombus: None  Indications: The patient presented last week to the hospital with symptoms of facial numbness and left-sided weakness.  Her symptoms resolved.  She had an MRI which revealed multiple embolic infarcts to the right brain.  Ultrasound indicated greater than 80% stenosis.  She had a CT angiogram which did not reveal stenosis the significant.  She was evaluated by the stroke team and felt that the etiology of her symptoms was from her right carotid stenosis.  She was initially scheduled to come in today for endarterectomy however over the weekend she had recurrent symptoms.  A repeat MRI was negative for any new infarcts.  She was kept in the hospital on IV heparin as we anticipated doing her surgery earlier, however this could not be arranged and therefore she is here today for endarterectomy.  She does state that she feels she is having some cognitive deficits and having difficulty finishing some of her thoughts.  The risks and benefits of surgery were discussed with the patient.  All of her questions were answered and she wished to proceed.  Procedure:  The patient was identified in the holding area and taken to [facility]  The patient was then placed supine on the table.   General endotrachial anesthesia was administered.  The patient was prepped and draped in the usual sterile fashion.  A time out was called and antibiotics were administered.  The incision was made along the anterior border of the right sternocleidomastoid muscle.  Cautery was used to dissect through the  subcutaneous tissue.  The platysma muscle was divided with cautery.  The internal jugular vein was exposed along its anterior medial border.  The common facial vein was exposed and then divided between 2-0 silk ties and metal clips.  The hypoglossal nerve was identified directly behind the facial vein, and was very low down near the carotid bifurcation.  I then dissected out the common carotid artery.  The vagus nerve was noted to be anterior and lateral.  It was visualized and protected throughout.  The common carotid artery was then circumferentially exposed and encircled with an umbilical tape.  The external carotid artery was externally rotated.  In order to get better visualization the ascending pharyngeal branch had to be ligated between silk ties.  There was also a branch off of the common carotid artery that had to be ligated for better exposure.  Once the external carotid artery was isolated it was encircled with a vessel loop.  I then proceeded with dissection of the internal carotid artery.  This proved to be very difficult because of the location of the hypoglossal nerve.  Hypoglossal nerve had to be fully mobilized including division of the ansa cervicalis for better mobilization.  I ended up dissecting out the internal carotid artery superior to the hypoglossal nerve to get adequate exposure.  Once I felt I had enough visualization, the patient was fully heparinized.  Blood pressure was maintained at greater than 160.  After the heparin circulated, a peripheral baby Belenda Cruise clamp was placed on the internal carotid artery and the external carotid artery, followed by placement of a  peripheral DeBakey clamp on the common carotid artery.  A #11 blade was used to make an arteriotomy in the common carotid artery which was extended up into the internal carotid artery.  The disease appeared to go much higher than anticipated.  There was approximately 75% stenosis at the carotid bifurcation, however there was a  posterior plaque that went approximately 2-3 cm up into the internal carotid artery.  I placed a 10 [place] shunt.  There was excellent backbleeding from the internal carotid artery.  I then proceeded with endarterectomy using a Kleinert Santiago Bumpers.  An eversion endarterectomy was performed in the external carotid artery.  I then removed the plaque from the internal carotid artery.  It was difficult to get a good endpoint.  I tried to get a good endpoint but because of the location of the lesion under the hypoglossal nerve as well as difficulty with visualization, I felt that the shunt had to be removed.  I felt that this would be safe given her backbleeding.  Once the shunt was removed I was able to get better visualization of the distal internal carotid artery.  However, the artery was very thin and the plaque was very difficult to remove.  I felt that I was in jeopardy of not being able to get a good endpoint and so once I had gotten a smooth surface I placed three 7-0 Prolene tacking sutures on the distal endpoint.  A bovine pericardial patch was then selected and patch angioplasty was performed with running 6-0 Prolene.  Prior to completion the appropriate flushing maneuvers were performed and the anastomosis was completed.  Several repair stitches were required for hemostasis.  In trying to repair bleeding from the superior medial aspect of the patch up near the distal arteriotomy, the patch blew out.  I had to re-clamp the artery.  At this point, I took down the patch within the internal carotid artery and pulled it back.  I inspected the artery.  It was very thin.  I felt the best way to repair this was to select another patch and redo the patch angioplasty of the distal internal carotid artery.  This time, I took larger bites on the internal carotid artery with 6-0 Prolene.  I then sewed the 2 ends of the patch together with the 6-0 Prolene.  Just prior to completion, the appropriate flushing maneuvers  were performed.  The anastomosis was completed.  The clamps were then sequentially removed.  This time there was good hemostasis.  Hand-held Doppler was used to evaluate signals in the common external and internal carotid artery.  The Allied appropriate signals.  The patient's heparin was then reversed with 50 mg of protamine.  The wound was inspected and hemostasis was excellent.  Once I was satisfied with hemostasis the wound was irrigated.  I reapproximated the carotid sheath with running 3-0 Vicryl.  The platysma muscle was reapproximated with 3-0 Vicryl and skin was closed with 4-0 Vicryl and Dermabond.  The patient was then successfully extubated.  She was found to be moving all 4 extremities to command.  She was taken to recovery room in stable condition.    Disposition:  To PACU in stable condition.  Relevant Operative Details: Location the hypoglossal nerve was very low, across the bifurcation.  It had to be extensively mobilized and manipulated in order to get good visualization.  The plaque extended posteriorly up to where I had the clamp.  I could not perform the operation with the  shunt.  Because of how thin the artery was up near the distal end of the plaque, the initial repair blew out.  I had to redo the distal portion of the patch and connected to the original patch in order to complete the repair.  Theotis Burrow, M.D. [facility] Office: [phone] Pager:  [phone]

## 2017-11-05 NOTE — Progress Notes (Signed)
11/05/2017 1820 Received pt to room 4E-05 from PACU S/P R carotid endarectomy.  Pt is A&O, Neuro intact.  Tele monitor applied and CCMD notified.  CHG bath given.  Oriented to room, call light and bed.  Call bell in reach, family at bedside. [place]

## 2017-11-05 NOTE — Anesthesia Procedure Notes (Signed)
Arterial Line Insertion Start/End10/30/2019 9:15 AM, 11/05/2017 9:21 AM Performed by: Teressa Lower., CRNA, CRNA  Patient location: Pre-op. Preanesthetic checklist: patient identified, IV checked, site marked, risks and benefits discussed, surgical consent, monitors and equipment checked, pre-op evaluation, timeout performed and anesthesia consent Lidocaine 1% used for infiltration Right, radial was placed Catheter size: 20 G Hand hygiene performed , maximum sterile barriers used  and Seldinger technique used Allen's test indicative of satisfactory collateral circulation Attempts: 1 Procedure performed without using ultrasound guided technique. Following insertion, dressing applied and Biopatch. Post procedure assessment: normal and unchanged  Patient tolerated the procedure well with no immediate complications.

## 2017-11-05 NOTE — Anesthesia Postprocedure Evaluation (Signed)
Anesthesia Post Note  Patient: Angela Abbott  Procedure(s) Performed: ENDARTERECTOMY CAROTID RIGHT (Right Neck) PATCH ANGIOPLASTY of right carotid artery using xenosure bovine pericardium patch (Right Neck)     Patient location during evaluation: PACU Anesthesia Type: General Level of consciousness: awake and alert Pain management: pain level controlled Vital Signs Assessment: post-procedure vital signs reviewed and stable Respiratory status: spontaneous breathing, nonlabored ventilation, respiratory function stable and patient connected to nasal cannula oxygen Cardiovascular status: blood pressure returned to baseline and stable Postop Assessment: no apparent nausea or vomiting Anesthetic complications: no    Last Vitals:  Vitals:   11/05/17 1721 11/05/17 1729  BP: 135/75   Pulse: 80 80  Resp: (!) 21 20  Temp:    SpO2: 95% 94%    Last Pain:  Vitals:   11/05/17 1729  TempSrc:   PainSc: 7                  , DAVID

## 2017-11-05 NOTE — Transfer of Care (Signed)
Immediate Anesthesia Transfer of Care Note  Patient: Angela Abbott  Procedure(s) Performed: ENDARTERECTOMY CAROTID RIGHT (Right Neck) PATCH ANGIOPLASTY of right carotid artery using xenosure bovine pericardium patch (Right Neck)  Patient Location: PACU  Anesthesia Type:General  Level of Consciousness: awake, alert  and oriented  Airway & Oxygen Therapy: Patient Spontanous Breathing and Patient connected to face mask oxygen  Post-op Assessment: Report given to RN and Post -op Vital signs reviewed and stable  Post vital signs: Reviewed and stable  Last Vitals:  Vitals Value Taken Time  BP    Temp    Pulse 96 11/05/2017  4:37 PM  Resp 21 11/05/2017  4:37 PM  SpO2 96 % 11/05/2017  4:37 PM  Vitals shown include unvalidated device data.  Last Pain:  Vitals:   11/05/17 0613  TempSrc: Oral  PainSc:          Complications: No apparent anesthesia complications

## 2017-11-05 NOTE — Anesthesia Procedure Notes (Signed)
Procedure Name: Intubation Date/Time: 11/05/2017 12:44 PM Performed by: Teressa Lower., CRNA Pre-anesthesia Checklist: Patient identified, Emergency Drugs available, Suction available and Patient being monitored Patient Re-evaluated:Patient Re-evaluated prior to induction Oxygen Delivery Method: Circle system utilized Preoxygenation: Pre-oxygenation with 100% oxygen Induction Type: IV induction Ventilation: Mask ventilation without difficulty Laryngoscope Size: Mac and 3 Grade View: Grade I Tube type: Oral Tube size: 7.0 mm Number of attempts: 1 Airway Equipment and Method: Stylet and Oral airway Placement Confirmation: ETT inserted through vocal cords under direct vision,  positive ETCO2 and breath sounds checked- equal and bilateral Secured at: 21 cm Tube secured with: Tape Dental Injury: Teeth and Oropharynx as per pre-operative assessment

## 2017-11-05 NOTE — Progress Notes (Signed)
S/p R CEA for symptomatic stenosis.  Neuro intact in PACU.  Tongue deviation to right, which is expected, given the manipulation of the hypoglossal nerve that was required to get adequate exposure of the artery.   Angela Abbott

## 2017-11-05 NOTE — Progress Notes (Signed)
PROGRESS NOTE    Angela Abbott  KGM:[id number] DOB: 1960-02-24 DOA: 11/01/2017 PCP: Sharilyn Sites, MD    Brief Narrative:  Angela Olivier Carteris a 57 y.o.femalewith medical history significant of tobacco abuseon estrogen hormone,hypothyroidism, and obesity discharged from the hospital on 23 October after being evaluated for stroke comes back last night with recurrent symptoms of left upper extremity tingling and numbness with word finding difficulty and articulation.  She was readmitted for evaluation of new stroke.  MRI does not show any new stroke at this time.  Because of recurrent symptoms, vascular surgery consulted and she will be scheduled for carotid endarterectomy in the next 24 to 48 hours. Assessment & Plan:   Principal Problem:   Numbness Active Problems:   Cigarette smoker   Hypothyroidism   CVA (cerebral vascular accident) ([place])   TIA (transient ischemic attack)   Sensory disturbance   Acute ischemic stroke ([place])   Subacute ischemic right parietal and right occipital temporal junction CVA from high-grade stenosis of right ICA Continue with aspirin, Plavix and heparin infusion added as per vascular surgery recommendations. She underwent carotid endarterectomy today without complications.  Continue with statin.    Hypothyroidism Continue Synthroid. No changes in meds.    GERD continue with PPI   Hypertension Sub optimal. Prn hydralazine ordered.   DVT prophylaxis: On heparin Code Status: Full code Family Communication: Discussed with daughter at bedside Disposition Plan: pending PT eval.   Consultants:  Vascular surgery [name] Dr Trula Slade.    Procedures:  MRI of the brain. Carotid endarterectomy.   Antimicrobials: None   Subjective:  Sleepy, no distress.   Objective: Vitals:   11/04/17 0515 11/04/17 1403 11/04/17 2121 11/05/17 0613  BP: (!) 181/71 (!) 158/82 (!) 147/85 (!) 153/83  Pulse: 66 66 72 (!) 56  Resp: 16 18 16 12   Temp: 97.8  F (36.6 C) 99.2 F (37.3 C) 99.2 F (37.3 C) 98 F (36.7 C)  TempSrc: Oral Oral Oral Oral  SpO2: 98% 93% 99% 97%  Weight: 106.2 kg   105.8 kg  Height:        Intake/Output Summary (Last 24 hours) at 11/05/2017 1611 Last data filed at 11/05/2017 1531 Gross per 24 hour  Intake 2155.15 ml  Output 700 ml  Net 1455.15 ml   Filed Weights   11/03/17 0534 11/04/17 0515 11/05/17 6440  Weight: 107.3 kg 106.2 kg 105.8 kg    Examination:  General exam: calm and comfortable. Not in any distress.  Respiratory system:  good air entry bilateral no wheezing or rhonchi Cardiovascular system: S1 & S2 heard, RRR. No JVD,  No pedal edema. Gastrointestinal system: Abdomen is soft, nontender, nondistended with good bowel sounds Central nervous system: Alert and oriented.  No focal deficits evident Extremities: Symmetric 5 x 5 power. No edema, or cyanosis.  Skin: No rashes, lesions or ulcers Psychiatry: Mood & affect appropriate.     Data Reviewed: I have personally reviewed following labs and imaging studies  CBC: Recent Labs  Lab 11/01/17 0146 11/01/17 0209 11/01/17 1906 11/03/17 0421 11/04/17 0521 11/05/17 0258  WBC 7.7  --  6.0 7.8 7.0 7.2  NEUTROABS 4.3  --   --   --   --   --   HGB 13.0 13.6 13.3 12.4 13.0 12.8  HCT 41.1 40.0 41.6 39.7 40.7 39.9  MCV 87.1  --  85.2 85.0 85.0 86.0  PLT 237  --  243 242 236 347   Basic Metabolic Panel: Recent Labs  Lab 11/01/17 0146 11/01/17 0209 11/01/17 1906  NA 140 141 139  K 3.8 3.7 4.4  CL 104 103 106  CO2 29  --  26  GLUCOSE 98 96 98  BUN 13 13 9   CREATININE 0.75 0.80 0.84  CALCIUM 9.4  --  9.3   GFR: Estimated Creatinine Clearance: 89.8 mL/min (by C-G formula based on SCr of 0.84 mg/dL). Liver Function Tests: Recent Labs  Lab 11/01/17 0146 11/01/17 1906  AST 17 16  ALT 23 20  ALKPHOS 65 70  BILITOT 0.8 0.8  PROT 7.5 6.9  ALBUMIN 3.8 3.5   No results for input(s): LIPASE, AMYLASE in the last 168 hours. No results  for input(s): AMMONIA in the last 168 hours. Coagulation Profile: Recent Labs  Lab 11/01/17 0146 11/01/17 1906  INR 0.94 0.97   Cardiac Enzymes: No results for input(s): CKTOTAL, CKMB, CKMBINDEX, TROPONINI in the last 168 hours. BNP (last 3 results) No results for input(s): PROBNP in the last 8760 hours. HbA1C: No results for input(s): HGBA1C in the last 72 hours. CBG: No results for input(s): GLUCAP in the last 168 hours. Lipid Profile: No results for input(s): CHOL, HDL, LDLCALC, TRIG, CHOLHDL, LDLDIRECT in the last 72 hours. Thyroid Function Tests: No results for input(s): TSH, T4TOTAL, FREET4, T3FREE, THYROIDAB in the last 72 hours. Anemia Panel: No results for input(s): VITAMINB12, FOLATE, FERRITIN, TIBC, IRON, RETICCTPCT in the last 72 hours. Sepsis Labs: No results for input(s): PROCALCITON, LATICACIDVEN in the last 168 hours.  Recent Results (from the past 240 hour(s))  Surgical pcr screen     Status: None   Collection Time: 10/31/17  2:27 PM  Result Value Ref Range Status   MRSA, PCR NEGATIVE NEGATIVE Final   Staphylococcus aureus NEGATIVE NEGATIVE Final    Comment: (NOTE) The Xpert SA Assay (FDA approved for NASAL specimens in patients 62 years of age and older), is one component of a comprehensive surveillance program. It is not intended to diagnose infection nor to guide or monitor treatment. Performed at [facility], [place] [address] [place], [place] [address]          Radiology Studies: No results found.      Scheduled Meds: . [MAR Hold] aspirin  325 mg Oral Daily  . [MAR Hold] atorvastatin  20 mg Oral q1800  . [MAR Hold] clopidogrel  75 mg Oral Daily  . [MAR Hold] fesoterodine  4 mg Oral Daily  . [MAR Hold] levothyroxine  125 mcg Oral QAC breakfast  . [MAR Hold] multivitamin with minerals  1 tablet Oral Daily  . [MAR Hold] nicotine  21 mg Transdermal Daily  . [MAR Hold] pantoprazole  40 mg Oral Daily  . [MAR Hold] senna-docusate  2  tablet Oral Daily  . [MAR Hold] sodium chloride flush  3 mL Intravenous Q12H   Continuous Infusions: . [MAR Hold] sodium chloride    . sodium chloride    . heparin Stopped (11/05/17 0829)     LOS: 2 days    Time spent: 25 minutes    Hosie Poisson, MD [facility] Pager [phone] If 7PM-7AM, please contact night-coverage [email or web address] Password [email or web address] 11/05/2017, 4:11 PM

## 2017-11-05 NOTE — Progress Notes (Signed)
ANTICOAGULATION CONSULT NOTE - Follow Up Consult  Pharmacy Consult for Heparin Indication: TIA, high grade ICA stenosis  Allergies  Allergen Reactions  . Bee Venom Shortness Of Breath and Swelling    Patient Measurements: Height: 5\' 6"  (167.6 cm) Weight: 233 lb 4 oz (105.8 kg) IBW/kg (Calculated) : 59.3 Heparin Dosing Weight: 84kg    Vital Signs: Temp: 98 F (36.7 C) (10/30 0613) Temp Source: Oral (10/30 9622) BP: 153/83 (10/30 2979) Pulse Rate: 56 (10/30 0613)  Labs: Recent Labs    11/03/17 0421  11/04/17 0521 11/04/17 0639 11/04/17 1533 11/05/17 0258  HGB 12.4  --  13.0  --   --  12.8  HCT 39.7  --  40.7  --   --  39.9  PLT 242  --  236  --   --  224  HEPARINUNFRC 0.70   < >  --  0.28* 0.26* 0.44   < > = values in this interval not displayed.    Estimated Creatinine Clearance: 89.8 mL/min (by C-G formula based on SCr of 0.84 mg/dL).  Assessment: 57 yo with a hx of high grade ICA stenosis. Pt placed on dual antiplatelets (ASA/Plavix) after TIA last week, subsequently had second TIA. VVS to do carotid endarterectomy today. Pharmacy consulted to manage IV heparin.  Heparin level is at goal at 0.44. No bleeding noted. Heparin to be held on call to ICA procedure. If heparin continued post procedure will resume at current rate.   Goal of Therapy:  Heparin level 0.3-0.5 units/ml Monitor platelets by anticoagulation protocol: Yes   Plan:  Continue heparin drip at current rate of 1200 units/hr if heparin continued post-procedure Daily heparin level and CBC Monitor for s/sx of bleeding    A. Levada Dy, PharmD, [place] Pager: [phone] Please utilize Amion for appropriate phone number to reach the unit pharmacist ([place])   11/05/2017 7:56 AM

## 2017-11-06 ENCOUNTER — Encounter (HOSPITAL_COMMUNITY): Payer: Self-pay | Admitting: Surgery

## 2017-11-06 LAB — BASIC METABOLIC PANEL
ANION GAP: 4 — AB (ref 5–15)
BUN: 9 mg/dL (ref 6–20)
CHLORIDE: 109 mmol/L (ref 98–111)
CO2: 26 mmol/L (ref 22–32)
CREATININE: 0.73 mg/dL (ref 0.44–1.00)
Calcium: 8.4 mg/dL — ABNORMAL LOW (ref 8.9–10.3)
GFR calc non Af Amer: >60 mL/min (ref >60)
Glucose, Bld: 108 mg/dL — ABNORMAL HIGH (ref 70–99)
Potassium: 3.9 mmol/L (ref 3.5–5.1)
Sodium: 139 mmol/L (ref 135–145)

## 2017-11-06 LAB — CBC
HEMATOCRIT: 36.2 % (ref 36.0–46.0)
HEMATOCRIT: 34.7 % — AB (ref 36.0–46.0)
HEMOGLOBIN: 11.7 g/dL — AB (ref 12.0–15.0)
HEMOGLOBIN: 10.8 g/dL — AB (ref 12.0–15.0)
MCH: 27.8 pg (ref 26.0–34.0)
MCH: 26.9 pg (ref 26.0–34.0)
MCHC: 32.3 g/dL (ref 30.0–36.0)
MCHC: 31.1 g/dL (ref 30.0–36.0)
MCV: 86 fL (ref 80.0–100.0)
MCV: 86.5 fL (ref 80.0–100.0)
NRBC: 0 % (ref 0.0–0.2)
Platelets: 227 10*3/uL (ref 150–400)
Platelets: 224 10*3/uL (ref 150–400)
RBC: 4.21 MIL/uL (ref 3.87–5.11)
RBC: 4.01 MIL/uL (ref 3.87–5.11)
RDW: 15.2 % (ref 11.5–15.5)
RDW: 15.4 % (ref 11.5–15.5)
WBC: 13.1 10*3/uL — ABNORMAL HIGH (ref 4.0–10.5)
WBC: 13.8 10*3/uL — AB (ref 4.0–10.5)
nRBC: 0 % (ref 0.0–0.2)

## 2017-11-06 LAB — POCT ACTIVATED CLOTTING TIME
ACTIVATED CLOTTING TIME: 230 s
Activated Clotting Time: 301 seconds

## 2017-11-06 LAB — CREATININE, SERUM
CREATININE: 0.81 mg/dL (ref 0.44–1.00)
GFR calc non Af Amer: >60 mL/min (ref >60)

## 2017-11-06 MED ORDER — NICOTINE 21 MG/24HR TD PT24: 21.0000 mg | MEDICATED_PATCH | Freq: Every day | TRANSDERMAL | 0 refills | Status: DC

## 2017-11-06 MED ORDER — DOCUSATE SODIUM 100 MG PO CAPS: 100.0000 mg | ORAL_CAPSULE | Freq: Every day | ORAL | 0 refills | Status: DC

## 2017-11-06 MED ORDER — TOLTERODINE TARTRATE ER 4 MG PO CP24: 4.0000 mg | ORAL_CAPSULE | Freq: Every day | ORAL | Status: DC

## 2017-11-06 NOTE — Evaluation (Signed)
Physical Therapy Evaluation Patient Details Name: Angela Abbott MRN: [id number] DOB: [birth year] Today's Date: 11/06/2017   History of Present Illness  57yo female who received R carotid endarterectomy on 11/05/17. PMH CVA, B knee pain, HTN, SAD, L knee scope   Clinical Impression  Patient received in bed, very pleasant and motivated to return home. Able to easily complete all functional bed mobility, transfers, and gait approximately 428f with no device and full independence, also able to complete flight of stairs with U railing and no significant difficulty. No unsteadiness or LOB noted. She was left in bed with all needs met. She is not in need of skilled PT services in the acute setting or following DC. PT signing off, thank you for the referral.     Follow Up Recommendations No PT follow up    Equipment Recommendations  None recommended by PT    Recommendations for Other Services       Precautions / Restrictions Precautions Precautions: None Restrictions Weight Bearing Restrictions: No      Mobility  Bed Mobility Overal bed mobility: Independent             General bed mobility comments: easily able to get in/out of bed without need for assist or cues   Transfers Overall transfer level: Independent Equipment used: None Transfers: Sit to/from Stand Sit to Stand: Independent         General transfer comment: no physical assist or cues given   Ambulation/Gait Ambulation/Gait assistance: Independent Gait Distance (Feet): 400 Feet Assistive device: None Gait Pattern/deviations: WFL(Within Functional Limits);Step-through pattern     General Gait Details: gait mechanics WNL, no significant LOB or unsteadiness   Stairs Stairs: Yes Stairs assistance: Modified independent (Device/Increase time) Stair Management: One rail Right Number of Stairs: 12    Wheelchair Mobility    Modified Rankin (Stroke Patients Only)       Balance Overall balance  assessment: Independent                                           Pertinent Vitals/Pain Pain Assessment: No/denies pain    Home Living Family/patient expects to be discharged to:: Private residence Living Arrangements: Alone Available Help at Discharge: Family;Available PRN/intermittently Type of Home: House Home Access: Stairs to enter Entrance Stairs-Rails: Can reach both Entrance Stairs-Number of Steps: 3 Home Layout: One level   Additional Comments: Pt able to live on first floor with access to bathroom    Prior Function Level of Independence: Independent         Comments: 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      Hand Dominance        Extremity/Trunk Assessment   Upper Extremity Assessment Upper Extremity Assessment: Overall WFL for tasks assessed    Lower Extremity Assessment Lower Extremity Assessment: Overall WFL for tasks assessed    Cervical / Trunk Assessment Cervical / Trunk Assessment: Normal  Communication   Communication: No difficulties  Cognition Arousal/Alertness: Awake/alert Behavior During Therapy: WFL for tasks assessed/performed Overall Cognitive Status: Within Functional Limits for tasks assessed                                        General Comments  Exercises     Assessment/Plan    PT Assessment Patent does not need any further PT services  PT Problem List         PT Treatment Interventions      PT Goals (Current goals can be found in the Care Plan section)  Acute Rehab PT Goals Patient Stated Goal: To go home PT Goal Formulation: With patient Time For Goal Achievement: 11/20/17 Potential to Achieve Goals: Good    Frequency     Barriers to discharge        Co-evaluation               AM-PAC PT "6 Clicks" Daily Activity  Outcome Measure Difficulty turning over in bed (including  adjusting bedclothes, sheets and blankets)?: None Difficulty moving from lying on back to sitting on the side of the bed? : None Difficulty sitting down on and standing up from a chair with arms (e.g., wheelchair, bedside commode, etc,.)?: None Help needed moving to and from a bed to chair (including a wheelchair)?: None Help needed walking in hospital room?: None Help needed climbing 3-5 steps with a railing? : None 6 Click Score: 24    End of Session   Activity Tolerance: Patient tolerated treatment well Patient left: in bed;with call bell/phone within reach   PT Visit Diagnosis: Muscle weakness (generalized) (M62.81)    Time: 6010-9323 PT Time Calculation (min) (ACUTE ONLY): 11 min   Charges:   PT Evaluation $PT Eval Low Complexity: 1 Low          Deniece Ree PT, DPT, CBIS  Supplemental Physical Therapist [place]    Pager [phone] Acute Rehab Office [phone]

## 2017-11-06 NOTE — Evaluation (Signed)
l Clinical/Bedside Swallow Evaluation Patient Details  Name: Angela Abbott MRN: [id number] Date of Birth: Nov 04, 1960  Today's Date: 11/06/2017 Time: SLP Start Time (ACUTE ONLY): 1205 SLP Stop Time (ACUTE ONLY): 1215 SLP Time Calculation (min) (ACUTE ONLY): 10 min  Past Medical History:  Past Medical History:  Diagnosis Date  . Arthritis    shoulders, knees, hips  . Dental crowns present   . GERD (gastroesophageal reflux disease)   . Hypertension   . Hypothyroidism   . Left knee pain 07/2014  . Pneumonia    hx of 02/2017   . Right knee pain   . Stress incontinence   . Stroke [facility])    speech difficulty recalling words  . Wears partial dentures    upper and lower   Past Surgical History:  Past Surgical History:  Procedure Laterality Date  . ABDOMINAL HYSTERECTOMY     partial  . [place]  . ENDARTERECTOMY Right 11/05/2017   Procedure: ENDARTERECTOMY CAROTID RIGHT;  Surgeon: [name], MD;  Location: [facility] OR;  Service: Vascular;  Laterality: Right;  . ESOPHAGOGASTRODUODENOSCOPY N/A 11/26/2012   Procedure: ESOPHAGOGASTRODUODENOSCOPY (EGD);  Surgeon: Shann Medal, MD;  Location: [facility];  Service: General;  Laterality: N/A;  . GANGLION CYST EXCISION Right 02/2008   foot  . KNEE ARTHROSCOPY WITH LATERAL MENISECTOMY Left 07/21/2014   Procedure: LEFT KNEE ARTHROSCOPY,  CHONDROPLASTY, WITH LATERAL AND MEDIAL  MENISCECTOMIES;  Surgeon: Kathryne Hitch, MD;  Location: [facility];  Service: Orthopedics;  Laterality: Left;  . KNEE ARTHROSCOPY WITH MEDIAL MENISECTOMY Left 07/21/2014   Procedure: KNEE ARTHROSCOPY WITH MEDIAL MENISECTOMY;  Surgeon: Kathryne Hitch, MD;  Location: [facility];  Service: Orthopedics;  Laterality: Left;  . LAPAROSCOPIC GASTRIC BANDING  02/18/2008  . LAPAROSCOPIC REPAIR AND REMOVAL OF GASTRIC BAND N/A 03/21/2017   Procedure: LAPAROSCOPIC REPAIR AND REMOVAL OF GASTRIC BAND;  Surgeon: [name], MD;   Location: [facility] ORS;  Service: General;  Laterality: N/A;  . PARTIAL HYSTERECTOMY  09/07/2002  . PATCH ANGIOPLASTY Right 11/05/2017   Procedure: PATCH ANGIOPLASTY of right carotid artery using xenosure bovine pericardium patch;  Surgeon: [name], MD;  Location: [facility] OR;  Service: Vascular;  Laterality: Right;  . SHOULDER ARTHROSCOPY WITH SUBACROMIAL DECOMPRESSION, ROTATOR CUFF REPAIR AND BICEP TENDON REPAIR Right 11/23/2015   Procedure: RIGHT SHOULDER ARTHROSCOPY WITH DEBRIDEMENT, SUBACROMIAL DECOMPRESSION, DISTAL CLAVICLE EXCISION, ROTATOR CUFF REPAIR AND BICEP TENODESIS;  Surgeon: Ninetta Lights, MD;  Location: [facility];  Service: Orthopedics;  Laterality: Right;   HPI:  57 y.o. female with medical history significant for hypothyroidism and obesity discharged from the hospital on 23 October after being evaluated for stroke, returned 10/26 with recurrent symptoms of left upper extremity tingling and numbness with word finding difficulty and articulation.  She was readmitted for evaluation of new stroke.  MRI does not show any new stroke at this time.  Vascular surgery consulted; pt underwent right CEA 10/30; post op with right tongue deviation secondary to manipulation of hypoglossal nerve that was necessary to get adequate exposure to artery per notes.    Assessment / Plan / Recommendation Clinical Impression  Pt presents with functional oropharyngeal swallow; she is compensating for tongue impairment due to disturbance of hypoglossal nerve s/p surgery.  There are no concerns for aspiration; she is masticating sufficiently.  Pt knowledgable about her situation.  No SLP f/u needed.  Continue regular diet and thin liquids.  SLP Visit Diagnosis: Dysphagia, unspecified (  R13.10)    Aspiration Risk  No limitations    Diet Recommendation   regular solids, thin liquids  Medication Administration: Whole meds with liquid    Other  Recommendations Oral Care Recommendations: Oral  care BID   Follow up Recommendations        Frequency and Duration            Prognosis        Swallow Study   General HPI: 57 y.o. female with medical history significant for hypothyroidism and obesity discharged from the hospital on 23 October after being evaluated for stroke, returned 10/26 with recurrent symptoms of left upper extremity tingling and numbness with word finding difficulty and articulation.  She was readmitted for evaluation of new stroke.  MRI does not show any new stroke at this time.  Vascular surgery consulted; pt underwent right CEA 10/30; post op with right tongue deviation secondary to manipulation of hypoglossal nerve that was necessary to get adequate exposure to artery per notes.  Type of Study: Bedside Swallow Evaluation Previous Swallow Assessment: last admission Diet Prior to this Study: Regular;Thin liquids Temperature Spikes Noted: Yes Respiratory Status: Room air History of Recent Intubation: No Behavior/Cognition: Alert;Cooperative;Pleasant mood Oral Cavity Assessment: Within Functional Limits Oral Care Completed by SLP: Yes Oral Cavity - Dentition: Adequate natural dentition Vision: Functional for self-feeding Self-Feeding Abilities: Able to feed self Patient Positioning: Upright in bed Baseline Vocal Quality: Normal Volitional Cough: Strong Volitional Swallow: Able to elicit    Oral/Motor/Sensory Function Lingual Symmetry: Suspected CN XII (hypoglossal) dysfunction;Abnormal symmetry right   Ice Chips Ice chips: Within functional limits   Thin Liquid Thin Liquid: Within functional limits    Nectar Thick Nectar Thick Liquid: Not tested   Honey Thick Honey Thick Liquid: Not tested   Puree Puree: Within functional limits   Solid     Solid: Within functional limits      Juan Quam Laurice 11/06/2017,12:58 PM    L. Tivis Ringer, [place] Office number [phone] Pager [phone]

## 2017-11-06 NOTE — Progress Notes (Signed)
[facility]  Subjective  - Doing well over all.  She has walked in her room and voided fine.  She is tolerating PO's with small bites and chewing well.   Objective 139/61 67 97.8 F (36.6 C) (Oral) 19 91%  Intake/Output Summary (Last 24 hours) at 11/06/2017 0715 Last data filed at 11/05/2017 1700 Gross per 24 hour  Intake 2600 ml  Output 1100 ml  Net 1500 ml    Moving all 4 extremities Smile is symmetric, tongue is deviated to the left.  Speech is slightly impaired secondary to tongue deviation.  Left neck incision with minimal edema superior incision, without frank hematoma. Heart RRR Lungs non labored breathing   Assessment/Planning: POD # 1 Left CEA  Left tongue deviation secondary to manipulation of the hypoglossal nerve will likely be temporary.  She was instructed to eat by taking small bites and chewing well. Plan for f/u in 2-3 weeks with Dr. Cristi Loron 11/06/2017 7:15 AM --  Laboratory Lab Results: Recent Labs    11/05/17 2320 11/06/17 0620  WBC 13.1* 13.8*  HGB 11.7* 10.8*  HCT 36.2 34.7*  PLT 227 224   BMET Recent Labs    11/05/17 2320 11/06/17 0620  NA  --  139  K  --  3.9  CL  --  109  CO2  --  26  GLUCOSE  --  108*  BUN  --  9  CREATININE 0.81 0.73  CALCIUM  --  8.4*    COAG Lab Results  Component Value Date   INR 0.97 11/01/2017   INR 0.94 11/01/2017   INR 0.98 10/27/2017   No results found for: PTT

## 2017-11-06 NOTE — Discharge Summary (Signed)
Physician Discharge Summary  Angela Abbott VPX:[id number] DOB: 05-21-1960 DOA: 11/01/2017  PCP: Sharilyn Sites, MD  Admit date: 11/01/2017 Discharge date: 11/06/2017  Admitted From: Home Disposition: Home   Recommendations for Outpatient Follow-up:  1. Follow up with PCP in 1-2 weeks 2. Please obtain BMP/CBC in one week   Home Health:yes  Discharge Condition:stable. CODE STATUS: full code Diet recommendation: Heart Healthy   Brief/Interim Summary: Angela Abbott a 57 y.o.femalewith medical history significant of tobacco abuseon estrogen hormone,hypothyroidism, and obesity discharged from the hospital on 23 October after being evaluated for stroke comes back last night with recurrent symptoms of left upper extremity tingling and numbness with word finding difficulty and articulation.  She was readmitted for evaluation of new stroke.  MRI does not show any new stroke at this time.  Because of recurrent symptoms, vascular surgery consulted and she underwent carotid endarterectomy this admission.   Discharge Diagnoses:  Principal Problem:   Numbness Active Problems:   Cigarette smoker   Hypothyroidism   CVA (cerebral vascular accident) ([place])   TIA (transient ischemic attack)   Sensory disturbance   Acute ischemic stroke ([place])  Subacute ischemic right parietal and right occipital temporal junction CVA from high-grade stenosis of right ICA Continue with aspirin, Plavix and heparin infusion added as per vascular surgery recommendations. She underwent carotid endarterectomy today without complications.  Continue with statin.    Hypothyroidism Continue Synthroid. No changes in meds.    GERD continue with PPI   Hypertension Resume home meds.   Discharge Instructions  Discharge Instructions    Diet - low sodium heart healthy   Complete by:  As directed    Discharge instructions   Complete by:  As directed    Follow up with PCP in one week.  Please  follow up with vascular surgery as recommended.     Allergies as of 11/06/2017      Reactions   Bee Venom Shortness Of Breath, Swelling      Medication List    STOP taking these medications   ciprofloxacin 500 MG tablet Commonly known as:  CIPRO     TAKE these medications   acetaminophen 500 MG tablet Commonly known as:  TYLENOL Take 1,000 mg by mouth daily as needed for moderate pain or headache.   aspirin 325 MG tablet Take 1 tablet (325 mg total) by mouth daily.   atorvastatin 20 MG tablet Commonly known as:  LIPITOR Take 1 tablet (20 mg total) by mouth daily at 6 PM.   clopidogrel 75 MG tablet Commonly known as:  PLAVIX Take 1 tablet (75 mg total) by mouth daily. Use plavix for 3 weeks, followed by aspirin alone.   diltiazem 180 MG 24 hr capsule Commonly known as:  DILACOR XR Take 180 mg by mouth at bedtime.   diltiazem 180 MG 24 hr capsule Commonly known as:  CARDIZEM CD Take 1 capsule by mouth daily.   docusate sodium 100 MG capsule Commonly known as:  COLACE Take 1 capsule (100 mg total) by mouth daily. Start taking on:  11/07/2017   levothyroxine 125 MCG tablet Commonly known as:  SYNTHROID, LEVOTHROID Take 125 mcg by mouth daily before breakfast.   multivitamin with minerals Tabs tablet Take 1 tablet by mouth daily.   nicotine 21 mg/24hr patch Commonly known as:  NICODERM CQ - dosed in mg/24 hours Place 1 patch (21 mg total) onto the skin daily. Start taking on:  11/07/2017   omeprazole 40 MG capsule Commonly known  as:  PRILOSEC Take 1 capsule (40 mg total) by mouth daily.   senna-docusate 8.6-50 MG tablet Commonly known as:  Senokot-S Take 1 tablet by mouth at bedtime as needed for mild constipation. What changed:    how much to take  when to take this   tolterodine 4 MG 24 hr capsule Commonly known as:  DETROL LA Take 1 capsule (4 mg total) by mouth daily.      Follow-up Information    [name], MD Follow up in 2 week(s).    Specialties:  Vascular Surgery, Cardiology Why:  office will call the patient Contact information: [address] [place] [address] [phone]          Allergies  Allergen Reactions  . Bee Venom Shortness Of Breath and Swelling    Consultations: Vascular surgery   Procedures/Studies: Ct Angio Head W Or Wo Contrast  Result Date: 10/28/2017 CLINICAL DATA:  Left hand and face intermittent numbness EXAM: CT ANGIOGRAPHY HEAD AND NECK TECHNIQUE: Multidetector CT imaging of the head and neck was performed using the standard protocol during bolus administration of intravenous contrast. Multiplanar CT image reconstructions and MIPs were obtained to evaluate the vascular anatomy. Carotid stenosis measurements (when applicable) are obtained utilizing NASCET criteria, using the distal internal carotid diameter as the denominator. CONTRAST:  80mL ISOVUE-370 IOPAMIDOL (ISOVUE-370) INJECTION 76% COMPARISON:  None. FINDINGS: CTA NECK FINDINGS AORTIC ARCH: There is no calcific atherosclerosis of the aortic arch. There is no aneurysm, dissection or hemodynamically significant stenosis of the visualized ascending aorta and aortic arch. Conventional 3 vessel aortic branching pattern. The visualized proximal subclavian arteries are widely patent. RIGHT CAROTID SYSTEM: --Common carotid artery: Widely patent origin without common carotid artery dissection or aneurysm. --Internal carotid artery: No dissection, occlusion or aneurysm. Mixed calcified and non-calcified atherosclerotic disease at the bifurcation, extending into the internal carotid artery, resulting in 50% stenosis. --External carotid artery: No acute abnormality. LEFT CAROTID SYSTEM: --Common carotid artery: Widely patent origin without common carotid artery dissection or aneurysm. --Internal carotid artery:No dissection, occlusion or aneurysm. Mixed calcified and non-calcified atherosclerotic disease at the bifurcation, extending into the  internal carotid artery, resulting in 20% stenosis. --External carotid artery: No acute abnormality. VERTEBRAL ARTERIES: Right dominant configuration. Both origins are normal. No dissection, occlusion or flow-limiting stenosis to the vertebrobasilar confluence. SKELETON: There is no bony spinal canal stenosis. No lytic or blastic lesion. OTHER NECK: Normal pharynx, larynx and major salivary glands. No cervical lymphadenopathy. Unremarkable thyroid gland. UPPER CHEST: No pneumothorax or pleural effusion. No nodules or masses. CTA HEAD FINDINGS ANTERIOR CIRCULATION: --Intracranial internal carotid arteries: Normal. --Anterior cerebral arteries: Normal. Both A1 segments are present. Patent anterior communicating artery. --Middle cerebral arteries: Normal. --Posterior communicating arteries: Absent bilaterally. POSTERIOR CIRCULATION: --Basilar artery: Normal. --Posterior cerebral arteries: Short segment severe stenosis of the right PCA P2 segment. There is occlusion of the left PCA P2 segment. --Superior cerebellar arteries: Normal. --Inferior cerebellar arteries: Normal anterior and posterior inferior cerebellar arteries. VENOUS SINUSES: As permitted by contrast timing, patent. ANATOMIC VARIANTS: None DELAYED PHASE: No parenchymal contrast enhancement. Review of the MIP images confirms the above findings. IMPRESSION: 1. Occlusion of the midportion of the left PCA P2 segment and severe stenosis of the proximal right PCA P2 segment. 2. No other occlusion or hemodynamically significant stenosis of the intracranial arteries. 3. 50% stenosis of the proximal right ICA. 20% stenosis of the proximal left ICA. Electronically Signed   By: Ulyses Jarred M.D.   On: 10/28/2017 03:33  Dg Chest 2 View  Result Date: 10/27/2017 CLINICAL DATA:  CVA EXAM: CHEST - 2 VIEW COMPARISON:  03/14/2017 FINDINGS: The heart size and mediastinal contours are within normal limits. Both lungs are clear. Mild widening of the right [facility] joint with  possible surgical changes at the distal right clavicle. IMPRESSION: No active cardiopulmonary disease. Electronically Signed   By: Donavan Foil M.D.   On: 10/27/2017 23:45   Ct Head Wo Contrast  Result Date: 11/01/2017 CLINICAL DATA:  Numbness of the left hand, left side of face, and tongue EXAM: CT HEAD WITHOUT CONTRAST TECHNIQUE: Contiguous axial images were obtained from the base of the skull through the vertex without intravenous contrast. COMPARISON:  Head CT and brain MRI October 27, 2017 FINDINGS: Brain: The ventricles are normal in size and configuration. There is no appreciable intracranial mass, hemorrhage, extra-axial fluid collection, or midline shift. There is a focal area of decreased attenuation in the superior right centrum semiovale, consistent with recent infarct noted in this area. There is subtle decreased attenuation in the posterosuperior right parietal lobe at the site of a recent prior infarct. Sites of other small recent infarcts in the right parietal lobe are not appreciable on this noncontrast enhanced study. Brain parenchyma elsewhere appears unremarkable. No new infarct is appreciable on noncontrast enhanced CT compared to recent studies. Vascular: No hyperdense vessel. There is calcification in each carotid siphon region. Skull: The bony calvarium appears intact. Sinuses/Orbits: There is opacification and mucosal thickening in several ethmoid air cells. Other visualized paranasal sinuses are clear. Visualized orbits appear symmetric bilaterally. Other: Visualized mastoid air cells are clear. IMPRESSION: Decreased attenuation at sites in the lateral superior right parietal lobe and superior posterior right centrum semiovale are consistent with recent infarcts demonstrated on recent MR. Several other small acute infarcts demonstrated on recent MR are not appreciable on this noncontrast enhanced CT examination. No acute infarct compared to recent prior study evident by CT. No mass or  hemorrhage. There are foci of arterial vascular calcification. There is ethmoid sinus disease. Electronically Signed   By: Lowella Grip III M.D.   On: 11/01/2017 02:30   Ct Head Wo Contrast  Result Date: 10/27/2017 CLINICAL DATA:  Intermittent LEFT face and LEFT hand numbness for 2 days. EXAM: CT HEAD WITHOUT CONTRAST TECHNIQUE: Contiguous axial images were obtained from the base of the skull through the vertex without intravenous contrast. COMPARISON:  CT HEAD June 27, 2017 FINDINGS: BRAIN: No intraparenchymal hemorrhage, mass effect nor midline shift. The ventricles and sulci are normal. No acute large vascular territory infarcts. Subcentimeter subtle RIGHT parietal hypodensity may be extra-axial (axial image 25). Basal cisterns are patent. VASCULAR: Trace calcific atherosclerosis carotid siphon. Mildly dense RIGHT cortical vein (likely [place]). SKULL/SOFT TISSUES: No skull fracture. No significant soft tissue swelling. ORBITS/SINUSES: The included ocular globes and orbital contents are normal.Trace paranasal sinus mucosal thickening. Mastoid air cells are well aerated. OTHER: None. IMPRESSION: Small RIGHT parietal RIGHT parietal subtle hypodensity may be extra-axial. Given adjacent dense cortical vein, venous infarct is possible versus small mass. Recommend MRI of the head with and without contrast and contrast enhanced MRV. Electronically Signed   By: Elon Alas M.D.   On: 10/27/2017 19:08   Ct Angio Neck W Or Wo Contrast  Result Date: 10/28/2017 CLINICAL DATA:  Left hand and face intermittent numbness EXAM: CT ANGIOGRAPHY HEAD AND NECK TECHNIQUE: Multidetector CT imaging of the head and neck was performed using the standard protocol during bolus administration of intravenous contrast. Multiplanar  CT image reconstructions and MIPs were obtained to evaluate the vascular anatomy. Carotid stenosis measurements (when applicable) are obtained utilizing NASCET criteria, using the distal internal  carotid diameter as the denominator. CONTRAST:  64mL ISOVUE-370 IOPAMIDOL (ISOVUE-370) INJECTION 76% COMPARISON:  None. FINDINGS: CTA NECK FINDINGS AORTIC ARCH: There is no calcific atherosclerosis of the aortic arch. There is no aneurysm, dissection or hemodynamically significant stenosis of the visualized ascending aorta and aortic arch. Conventional 3 vessel aortic branching pattern. The visualized proximal subclavian arteries are widely patent. RIGHT CAROTID SYSTEM: --Common carotid artery: Widely patent origin without common carotid artery dissection or aneurysm. --Internal carotid artery: No dissection, occlusion or aneurysm. Mixed calcified and non-calcified atherosclerotic disease at the bifurcation, extending into the internal carotid artery, resulting in 50% stenosis. --External carotid artery: No acute abnormality. LEFT CAROTID SYSTEM: --Common carotid artery: Widely patent origin without common carotid artery dissection or aneurysm. --Internal carotid artery:No dissection, occlusion or aneurysm. Mixed calcified and non-calcified atherosclerotic disease at the bifurcation, extending into the internal carotid artery, resulting in 20% stenosis. --External carotid artery: No acute abnormality. VERTEBRAL ARTERIES: Right dominant configuration. Both origins are normal. No dissection, occlusion or flow-limiting stenosis to the vertebrobasilar confluence. SKELETON: There is no bony spinal canal stenosis. No lytic or blastic lesion. OTHER NECK: Normal pharynx, larynx and major salivary glands. No cervical lymphadenopathy. Unremarkable thyroid gland. UPPER CHEST: No pneumothorax or pleural effusion. No nodules or masses. CTA HEAD FINDINGS ANTERIOR CIRCULATION: --Intracranial internal carotid arteries: Normal. --Anterior cerebral arteries: Normal. Both A1 segments are present. Patent anterior communicating artery. --Middle cerebral arteries: Normal. --Posterior communicating arteries: Absent bilaterally. POSTERIOR  CIRCULATION: --Basilar artery: Normal. --Posterior cerebral arteries: Short segment severe stenosis of the right PCA P2 segment. There is occlusion of the left PCA P2 segment. --Superior cerebellar arteries: Normal. --Inferior cerebellar arteries: Normal anterior and posterior inferior cerebellar arteries. VENOUS SINUSES: As permitted by contrast timing, patent. ANATOMIC VARIANTS: None DELAYED PHASE: No parenchymal contrast enhancement. Review of the MIP images confirms the above findings. IMPRESSION: 1. Occlusion of the midportion of the left PCA P2 segment and severe stenosis of the proximal right PCA P2 segment. 2. No other occlusion or hemodynamically significant stenosis of the intracranial arteries. 3. 50% stenosis of the proximal right ICA. 20% stenosis of the proximal left ICA. Electronically Signed   By: Ulyses Jarred M.D.   On: 10/28/2017 03:33   Mr Brain Wo Contrast  Result Date: 11/01/2017 CLINICAL DATA:  Word-finding difficulties and left-sided numbness. EXAM: MRI HEAD WITHOUT CONTRAST TECHNIQUE: Multiplanar, multiecho pulse sequences of the brain and surrounding structures were obtained without intravenous contrast. COMPARISON:  Head CT 11/01/2017 Brain MRI 10/27/2017 FINDINGS: BRAIN: Expected fading of DWI hyperintensity at multiple locations of the right frontal lobe, right centrum semiovale and right occipital lobe. No new area of ischemia. The midline structures are normal. No midline shift or other mass effect. Mild edema at the sites of recent infarcts in the above-described locations. No hydrocephalus. The cerebral and cerebellar volume are age-appropriate. Susceptibility-sensitive sequences show no chronic microhemorrhage or superficial siderosis. VASCULAR: Major intracranial arterial and venous sinus flow voids are normal. SKULL AND UPPER CERVICAL SPINE: Calvarial bone marrow signal is normal. There is no skull base mass. Visualized upper cervical spine and soft tissues are normal.  SINUSES/ORBITS: No fluid levels or advanced mucosal thickening. No mastoid or middle ear effusion. The orbits are normal. IMPRESSION: 1. Expected evolution of signal changes associated with multifocal ischemia of the right frontal lobe, right centrum semiovale and right  occipital lobe. 2. No new site of ischemia or other acute abnormality. Electronically Signed   By: Ulyses Jarred M.D.   On: 11/01/2017 21:11   Mr Jeri Cos And Wo Contrast  Result Date: 10/27/2017 CLINICAL DATA:  57 y/o F; numbness in the left hand and left face intermittent since Saturday. Symptoms worse today. EXAM: MRI HEAD WITHOUT AND WITH CONTRAST MRV HEAD WITHOUT CONTRAST TECHNIQUE: Multiplanar, multiecho pulse sequences of the brain and surrounding structures were obtained without with intravenous contrast. Angiographic images of the intracranial venous structures were obtained using MRV technique without intravenous contrast. COMPARISON:  10/27/2017 CT head CONTRAST:  10 cc Gadavist FINDINGS: MR HEAD FINDINGS Brain: There several scattered foci of reduced diffusion in the right parietal lobe in the right occipitotemporal junction. Some of the lesions have variable diffusion, for example the right occipitotemporal lesion is hypointense on ADC (series 350, image 22) and the right postcentral gyrus lesion is mildly hypointense/near isointense on ADC (series 305, image 47). Additionally, some of the lesions enhance. Findings are consistent with mixed age acute and subacute infarctions. No focal mass effect, extra-axial collection, hydrocephalus, or herniation. A few punctate foci of susceptibility hypointensity are present in the right parietal lobe, discrete from areas of ischemia, compatible hemosiderin deposition of chronic microhemorrhage. Vascular: Normal flow voids. Skull and upper cervical spine: Normal marrow signal. Sinuses/Orbits: Mild mucosal thickening of the ethmoid and sphenoid sinuses. No abnormal signal of the mastoid air  cells. Orbits are unremarkable. Other: None. MRV HEAD FINDINGS Normal flow related signal within the superior sagittal sinus, internal cerebral veins, basal veins of Rosenthal, straight sinus, bilateral transverse sinus, bilateral sigmoid sinus, and bilateral upper internal jugular veins. Additionally, there is normal flow related signal within the large cortical veins. No evidence of dural venous sinus thrombosis. IMPRESSION: 1. Several small scattered foci of acute and subacute infarction (mixed age) are present in the right parietal lobe and the right occipitotemporal junction. No acute hemorrhage. No significant mass effect. 2. Normal MRV.  No evidence of dural venous sinus thrombosis. These results were called by telephone at the time of interpretation on 10/27/2017 at 10:45 pm to Dr. Maryan Rued, who verbally acknowledged these results. Electronically Signed   By: Kristine Garbe M.D.   On: 10/27/2017 22:50   Mr Mrv Head Wo Cm  Result Date: 10/27/2017 CLINICAL DATA:  57 y/o F; numbness in the left hand and left face intermittent since Saturday. Symptoms worse today. EXAM: MRI HEAD WITHOUT AND WITH CONTRAST MRV HEAD WITHOUT CONTRAST TECHNIQUE: Multiplanar, multiecho pulse sequences of the brain and surrounding structures were obtained without with intravenous contrast. Angiographic images of the intracranial venous structures were obtained using MRV technique without intravenous contrast. COMPARISON:  10/27/2017 CT head CONTRAST:  10 cc Gadavist FINDINGS: MR HEAD FINDINGS Brain: There several scattered foci of reduced diffusion in the right parietal lobe in the right occipitotemporal junction. Some of the lesions have variable diffusion, for example the right occipitotemporal lesion is hypointense on ADC (series 350, image 22) and the right postcentral gyrus lesion is mildly hypointense/near isointense on ADC (series 305, image 47). Additionally, some of the lesions enhance. Findings are consistent  with mixed age acute and subacute infarctions. No focal mass effect, extra-axial collection, hydrocephalus, or herniation. A few punctate foci of susceptibility hypointensity are present in the right parietal lobe, discrete from areas of ischemia, compatible hemosiderin deposition of chronic microhemorrhage. Vascular: Normal flow voids. Skull and upper cervical spine: Normal marrow signal. Sinuses/Orbits: Mild mucosal thickening of  the ethmoid and sphenoid sinuses. No abnormal signal of the mastoid air cells. Orbits are unremarkable. Other: None. MRV HEAD FINDINGS Normal flow related signal within the superior sagittal sinus, internal cerebral veins, basal veins of Rosenthal, straight sinus, bilateral transverse sinus, bilateral sigmoid sinus, and bilateral upper internal jugular veins. Additionally, there is normal flow related signal within the large cortical veins. No evidence of dural venous sinus thrombosis. IMPRESSION: 1. Several small scattered foci of acute and subacute infarction (mixed age) are present in the right parietal lobe and the right occipitotemporal junction. No acute hemorrhage. No significant mass effect. 2. Normal MRV.  No evidence of dural venous sinus thrombosis. These results were called by telephone at the time of interpretation on 10/27/2017 at 10:45 pm to Dr. Maryan Rued, who verbally acknowledged these results. Electronically Signed   By: Kristine Garbe M.D.   On: 10/27/2017 22:50   Vas US Carotid (at [place] Only)  Result Date: 10/29/2017 Carotid Arterial Duplex Study Indications:  CVA. Risk Factors: Current smoker. Performing Technologist: Maudry Mayhew MHA, RDMS, RVT, RDCS  Examination Guidelines: A complete evaluation includes B-mode imaging, spectral Doppler, color Doppler, and power Doppler as needed of all accessible portions of each vessel. Bilateral testing is considered an integral part of a complete examination. Limited examinations for reoccurring  indications may be performed as noted.  Right Carotid Findings: +----------+--------+--------+--------+--------------------+-------------------+           PSV cm/sEDV cm/sStenosisDescribe            Comments            +----------+--------+--------+--------+--------------------+-------------------+ CCA Prox  179                                         Focal dilatation,                                                         1.3cm to 0.7cm      +----------+--------+--------+--------+--------------------+-------------------+ CCA Distal78      14              smooth and                                                                heterogenous                            +----------+--------+--------+--------+--------------------+-------------------+ ICA Prox  345     136             smooth and                                                                heterogenous                            +----------+--------+--------+--------+--------------------+-------------------+  ICA Distal112     39                                                      +----------+--------+--------+--------+--------------------+-------------------+ ECA       120     12                                                      +----------+--------+--------+--------+--------------------+-------------------+ +----------+--------+-------+----------------+-------------------+           PSV cm/sEDV cmsDescribe        Arm Pressure (mmHG) +----------+--------+-------+----------------+-------------------+ [id number]            Multiphasic, WNL                    +----------+--------+-------+----------------+-------------------+ +---------+--------+--+--------+--+---------+ VertebralPSV cm/s70EDV cm/s16Antegrade +---------+--------+--+--------+--+---------+  Left Carotid Findings: +----------+--------+--------+--------+--------------------------+--------+           PSV  cm/sEDV cm/sStenosisDescribe                  Comments +----------+--------+--------+--------+--------------------------+--------+ CCA Prox  126     27                                                 +----------+--------+--------+--------+--------------------------+--------+ CCA Distal145     35                                                 +----------+--------+--------+--------+--------------------------+--------+ ICA Prox  170     57              heterogenous and irregular         +----------+--------+--------+--------+--------------------------+--------+ ICA Distal121     40                                                 +----------+--------+--------+--------+--------------------------+--------+ ECA       133     10                                                 +----------+--------+--------+--------+--------------------------+--------+ +----------+--------+--------+----------------+-------------------+ SubclavianPSV cm/sEDV cm/sDescribe        Arm Pressure (mmHG) +----------+--------+--------+----------------+-------------------+           135             Multiphasic, WNL                    +----------+--------+--------+----------------+-------------------+ +---------+--------+--+--------+--+---------+ VertebralPSV cm/s76EDV cm/s11Antegrade +---------+--------+--+--------+--+---------+  Summary: Right Carotid: Velocities in the right ICA are consistent with a 80-99%                stenosis. Left Carotid: Velocities in the left ICA are consistent  with a 40-59% stenosis. Vertebrals:  Bilateral vertebral arteries demonstrate antegrade flow. Subclavians: Normal flow hemodynamics were seen in bilateral subclavian              arteries. *See table(s) above for measurements and observations.  Electronically signed by Antony Contras MD on 10/29/2017 at 8:01:15 AM.    Final        Subjective: No chest pain or sob.   Discharge Exam: Vitals:   11/06/17  0602 11/06/17 0746  BP: 139/61 (!) 128/53  Pulse: 67 66  Resp: 19 16  Temp: 97.8 F (36.6 C) 98.1 F (36.7 C)  SpO2: 91% 97%   Vitals:   11/05/17 2134 11/06/17 0003 11/06/17 0602 11/06/17 0746  BP: 133/66 138/74 139/61 (!) 128/53  Pulse: 77 74 67 66  Resp: (!) 24 16 19 16   Temp: 97.9 F (36.6 C) 98.1 F (36.7 C) 97.8 F (36.6 C) 98.1 F (36.7 C)  TempSrc: Oral Oral Oral Oral  SpO2: 93% 95% 91% 97%  Weight:      Height:        General: Pt is alert, awake, not in acute distress Cardiovascular: RRR, S1/S2 +, no rubs, no gallops Respiratory: CTA bilaterally, no wheezing, no rhonchi Abdominal: Soft, NT, ND, bowel sounds + Extremities: no edema, no cyanosis    The results of significant diagnostics from this hospitalization (including imaging, microbiology, ancillary and laboratory) are listed below for reference.     Microbiology: Recent Results (from the past 240 hour(s))  Surgical pcr screen     Status: None   Collection Time: 10/31/17  2:27 PM  Result Value Ref Range Status   MRSA, PCR NEGATIVE NEGATIVE Final   Staphylococcus aureus NEGATIVE NEGATIVE Final    Comment: (NOTE) The Xpert SA Assay (FDA approved for NASAL specimens in patients 80 years of age and older), is one component of a comprehensive surveillance program. It is not intended to diagnose infection nor to guide or monitor treatment. Performed at [facility], [place] [address] [place], [place] [address]      Labs: BNP (last 3 results) No results for input(s): BNP in the last 8760 hours. Basic Metabolic Panel: Recent Labs  Lab 11/01/17 0146 11/01/17 0209 11/01/17 1906 11/05/17 2320 11/06/17 0620  NA 140 141 139  --  139  K 3.8 3.7 4.4  --  3.9  CL 104 103 106  --  109  CO2 29  --  26  --  26  GLUCOSE 98 96 98  --  108*  BUN 13 13 9   --  9  CREATININE 0.75 0.80 0.84 0.81 0.73  CALCIUM 9.4  --  9.3  --  8.4*   Liver Function Tests: Recent Labs  Lab 11/01/17 0146 11/01/17 1906   AST 17 16  ALT 23 20  ALKPHOS 65 70  BILITOT 0.8 0.8  PROT 7.5 6.9  ALBUMIN 3.8 3.5   No results for input(s): LIPASE, AMYLASE in the last 168 hours. No results for input(s): AMMONIA in the last 168 hours. CBC: Recent Labs  Lab 11/01/17 0146  11/03/17 0421 11/04/17 0521 11/05/17 0258 11/05/17 2320 11/06/17 0620  WBC 7.7   < > 7.8 7.0 7.2 13.1* 13.8*  NEUTROABS 4.3  --   --   --   --   --   --   HGB 13.0   < > 12.4 13.0 12.8 11.7* 10.8*  HCT 41.1   < > 39.7 40.7 39.9 36.2 34.7*  MCV 87.1   < > 85.0 85.0 86.0 86.0 86.5  PLT 237   < > 242 236 224 227 224   < > = values in this interval not displayed.   Cardiac Enzymes: No results for input(s): CKTOTAL, CKMB, CKMBINDEX, TROPONINI in the last 168 hours. BNP: Invalid input(s): POCBNP CBG: No results for input(s): GLUCAP in the last 168 hours. D-Dimer No results for input(s): DDIMER in the last 72 hours. Hgb A1c No results for input(s): HGBA1C in the last 72 hours. Lipid Profile No results for input(s): CHOL, HDL, LDLCALC, TRIG, CHOLHDL, LDLDIRECT in the last 72 hours. Thyroid function studies No results for input(s): TSH, T4TOTAL, T3FREE, THYROIDAB in the last 72 hours.  Invalid input(s): FREET3 Anemia work up No results for input(s): VITAMINB12, FOLATE, FERRITIN, TIBC, IRON, RETICCTPCT in the last 72 hours. Urinalysis    Component Value Date/Time   COLORURINE STRAW (A) 11/01/2017 1222   APPEARANCEUR CLEAR 11/01/2017 1222   LABSPEC 1.008 11/01/2017 1222   PHURINE 7.0 11/01/2017 1222   GLUCOSEU NEGATIVE 11/01/2017 1222   HGBUR NEGATIVE 11/01/2017 1222   BILIRUBINUR NEGATIVE 11/01/2017 1222   KETONESUR NEGATIVE 11/01/2017 1222   PROTEINUR NEGATIVE 11/01/2017 1222   UROBILINOGEN 0.2 07/07/2013 1432   NITRITE NEGATIVE 11/01/2017 1222   LEUKOCYTESUR NEGATIVE 11/01/2017 1222   Sepsis Labs Invalid input(s): PROCALCITONIN,  WBC,  LACTICIDVEN Microbiology Recent Results (from the past 240 hour(s))  Surgical pcr  screen     Status: None   Collection Time: 10/31/17  2:27 PM  Result Value Ref Range Status   MRSA, PCR NEGATIVE NEGATIVE Final   Staphylococcus aureus NEGATIVE NEGATIVE Final    Comment: (NOTE) The Xpert SA Assay (FDA approved for NASAL specimens in patients 66 years of age and older), is one component of a comprehensive surveillance program. It is not intended to diagnose infection nor to guide or monitor treatment. Performed at [facility], [place] [address] [place], [place] [address]      Time coordinating discharge: 35  minutes  SIGNED:   Hosie Poisson, MD  [facility] 11/06/2017, 3:16 PM Pager   If 7PM-7AM, please contact night-coverage [email or web address] Password [email or web address]

## 2017-11-07 ENCOUNTER — Other Ambulatory Visit: Payer: Self-pay | Admitting: Physician Assistant

## 2017-11-07 MED ORDER — OXYCODONE-ACETAMINOPHEN 5-325 MG PO TABS: 1.0000 | ORAL_TABLET | ORAL | 0 refills | Status: DC | PRN

## 2017-11-13 DIAGNOSIS — I6521 Occlusion and stenosis of right carotid artery: Secondary | ICD-10-CM | POA: Diagnosis not present

## 2017-11-13 DIAGNOSIS — I6529 Occlusion and stenosis of unspecified carotid artery: Secondary | ICD-10-CM | POA: Diagnosis not present

## 2017-11-13 DIAGNOSIS — I639 Cerebral infarction, unspecified: Secondary | ICD-10-CM | POA: Diagnosis not present

## 2017-11-24 ENCOUNTER — Other Ambulatory Visit: Payer: Self-pay

## 2017-11-24 ENCOUNTER — Encounter: Payer: Self-pay | Admitting: Surgery

## 2017-11-24 ENCOUNTER — Ambulatory Visit (INDEPENDENT_AMBULATORY_CARE_PROVIDER_SITE_OTHER): Payer: Self-pay | Admitting: Surgery

## 2017-11-24 VITALS — BP 127/74 | HR 69 | Temp 97.2°F | Resp 16 | Ht 66.0 in | Wt 233.0 lb

## 2017-11-24 DIAGNOSIS — I6521 Occlusion and stenosis of right carotid artery: Secondary | ICD-10-CM

## 2017-11-24 NOTE — Progress Notes (Signed)
Patient name: Angela Abbott MRN: [id number] DOB: 1960/06/17 Sex: female  REASON FOR VISIT:     post op  HISTORY OF PRESENT ILLNESS:   Angela Abbott is a 57 y.o. female who is status post right carotid endarterectomy on 11/05/2017.  This was done for symptomatic right carotid stenosis.  A preoperative MRI revealed multiple embolic infarcts in the right brain.  Intraoperative findings included a low hypoglossal nerve which had to be extensively mobilized in order to perform the operation.  She had tongue deviation postoperatively.  She was referred to speech therapy by her family medical doctor.  She continues to not smoke.  She is contemplating a plant-based diet.  CURRENT MEDICATIONS:    Current Outpatient Medications  Medication Sig Dispense Refill  . acetaminophen (TYLENOL) 500 MG tablet Take 1,000 mg by mouth daily as needed for moderate pain or headache.    Marland Kitchen aspirin 325 MG tablet Take 1 tablet (325 mg total) by mouth daily. 30 tablet 0  . atorvastatin (LIPITOR) 20 MG tablet Take 1 tablet (20 mg total) by mouth daily at 6 PM. 30 tablet 1  . clopidogrel (PLAVIX) 75 MG tablet Take 1 tablet (75 mg total) by mouth daily. Use plavix for 3 weeks, followed by aspirin alone. 21 tablet 0  . diltiazem (CARDIZEM CD) 180 MG 24 hr capsule Take 1 capsule by mouth daily.    Marland Kitchen diltiazem (DILACOR XR) 180 MG 24 hr capsule Take 180 mg by mouth at bedtime.    Marland Kitchen levothyroxine (SYNTHROID, LEVOTHROID) 125 MCG tablet Take 125 mcg by mouth daily before breakfast.    . Multiple Vitamin (MULTIVITAMIN WITH MINERALS) TABS tablet Take 1 tablet by mouth daily.    . nicotine (NICODERM CQ - DOSED IN MG/24 HOURS) 21 mg/24hr patch Place 1 patch (21 mg total) onto the skin daily. 28 patch 0  . omeprazole (PRILOSEC) 40 MG capsule Take 1 capsule (40 mg total) by mouth daily. 30 capsule 0  . senna-docusate (SENOKOT-S) 8.6-50 MG tablet Take 1 tablet by mouth at bedtime as needed for  mild constipation. (Patient taking differently: Take 2 tablets by mouth daily. ) 30 tablet 0  . tolterodine (DETROL LA) 4 MG 24 hr capsule Take 1 capsule (4 mg total) by mouth daily.    Marland Kitchen docusate sodium (COLACE) 100 MG capsule Take 1 capsule (100 mg total) by mouth daily. (Patient not taking: Reported on 11/24/2017) 10 capsule 0  . oxyCODONE-acetaminophen (PERCOCET) 5-325 MG tablet Take 1 tablet by mouth every 4 (four) hours as needed for up to 15 doses for severe pain. (Patient not taking: Reported on 11/24/2017) 15 tablet 0   No current facility-administered medications for this visit.     REVIEW OF SYSTEMS:   [X]  denotes positive finding, [ ]  denotes negative finding Cardiac  Comments:  Chest pain or chest pressure:    Shortness of breath upon exertion:    Short of breath when lying flat:    Irregular heart rhythm:    Constitutional    Fever or chills:      PHYSICAL EXAM:   Vitals:   11/24/17 0843 11/24/17 0845  BP: 136/84 127/74  Pulse: 69   Resp: 16   Temp: (!) 97.2 F (36.2 C)   TempSrc: Oral   SpO2: 96%   Weight: 233 lb (105.7 kg)   Height: 5\' 6"  (1.676 m)     GENERAL: The patient is a well-nourished female, in no acute distress. The vital signs are documented  above. CARDIOVASCULAR: There is a regular rate and rhythm. PULMONARY: Non-labored respirations Incision is well-healed. 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  In the meantime she will do physical and speech therapy.  It is okay to come off Plavix.  I have given her a work excuse for 6 weeks.  She will follow-up in 6 months with an ultrasound.  Annamarie Major, MD [facility] [phone] Pager [phone]

## 2017-11-27 NOTE — Progress Notes (Signed)
[name] [address] [place]. [place] [address] [phone]       OFFICE FOLLOW UP NOTE  [name] Date of Birth:  09-29-1960 Medical Record Number:  [id number]   Reason for Referral:  hospital stroke follow up  CHIEF COMPLAINT:  Chief Complaint  Patient presents with  . Follow-up    Hospital Stroke follow up room in back hallway pt alone     HPI: Angela Abbott is being seen today for initial visit in the office for right parietal and occipital infarcts on 10/27/2017 secondary to right ICA stenosis and underwent right CEA on 11/05/2017. History obtained from patient and chart review. Reviewed all radiology images and labs personally.  [name] is a 57 y.o. female with history of HTN, GERD, tobacco use and etoh use  who presented with L sided numbness and weakness.  CT head reviewed and showed right parietal hypodensity.  MRI brain reviewed and showed several small acute and subacute right parietal and right occipital temporal junction infarcts.  MRV normal.  CTA head and neck showed occluded left P2 and severe stenosis right P2 with right ICA 50% stenosis and left ICA 20% stenosis.  Carotid Dopplers showed right ICA 80-99% stenosis and left ICA 40 to 59% stenosis.  2D echo showed an EF of 60 to 65% without cardiac source of embolus identified.  HTN initially elevated but stabilized throughout admission and recommended long-term BP goal normotensive range.  LDL 20 with recommendation of continuation of atorvastatin.  Due to symptomatic right ICA high-grade stenosis, was recommended for vascular surgery to consider right CEA.  Recommended DAPT for 3 weeks and aspirin alone. Patient return to ED on 11/01/2017 with recurrent symptoms of left upper extremity tingling and numbness with word finding difficulty and articulation.  MRI brain reviewed and was negative for acute infarct.  Patient did undergo right carotid enterectomy on 23/76/2831 without  complication.  Patient did have follow-up with [facility] Dr. Trula Slade on 11/24/2017 with recommendations of continuing aspirin only and discontinue Plavix with follow-up carotid ultrasound in 6 months time.  Patient is being seen today for hospital follow-up. She is recovering well without residual weakness. She does endorse continued speaking difficulties and swallowing difficulties that occurred due to low hypoglossal nerve which was extensively mobilized in order to perform right CEA and was found to have tongue deviation postoperatively.  She does believe this has been improving and plans on participating in speech therapy next week.  She does endorse mild numbness at right CEA site but incision healing well without evidence of infection.  She has been eating healthier and consider doing plant based diet. She has quit smoking completely. She plans on returning to work on 12/22/17 at [employer]. Continues to aspirin 325mg  without bleeding but does endorse mild bruising. Continues to take atorvastatin 20mg  without side effects of myalgias. Blood pressure today 121/75. She has discontinued estrogen therapy.  She is very motivated and changing her ways of living to overall be healthier and prevent future strokes.  She has returned back to all prior activities and feels as though she will be ready to return to work on the schedule goal date.  No further concerns at this time.  Denies new or worsening stroke/TIA symptoms.   ROS:   14 system review of systems performed and negative with exception of trouble swallowing, weakness and difficulty swallowing  PMH:  Past Medical History:  Diagnosis Date  . Arthritis    shoulders,  knees, hips  . Carotid artery occlusion   . Dental crowns present   . GERD (gastroesophageal reflux disease)   . Hypertension   . Hypothyroidism   . Left knee pain 07/2014  . Pneumonia    hx of 02/2017   . Right knee pain   . Stress incontinence   . Stroke [facility])    speech difficulty  recalling words  . Wears partial dentures    upper and lower    PSH:  Past Surgical History:  Procedure Laterality Date  . ABDOMINAL HYSTERECTOMY     partial  . CAROTID ENDARTERECTOMY    . [place]  . ENDARTERECTOMY Right 11/05/2017   Procedure: ENDARTERECTOMY CAROTID RIGHT;  Surgeon: [name], MD;  Location: [facility] OR;  Service: Vascular;  Laterality: Right;  . ESOPHAGOGASTRODUODENOSCOPY N/A 11/26/2012   Procedure: ESOPHAGOGASTRODUODENOSCOPY (EGD);  Surgeon: Shann Medal, MD;  Location: [facility];  Service: General;  Laterality: N/A;  . GANGLION CYST EXCISION Right 02/2008   foot  . KNEE ARTHROSCOPY WITH LATERAL MENISECTOMY Left 07/21/2014   Procedure: LEFT KNEE ARTHROSCOPY,  CHONDROPLASTY, WITH LATERAL AND MEDIAL  MENISCECTOMIES;  Surgeon: Kathryne Hitch, MD;  Location: [facility];  Service: Orthopedics;  Laterality: Left;  . KNEE ARTHROSCOPY WITH MEDIAL MENISECTOMY Left 07/21/2014   Procedure: KNEE ARTHROSCOPY WITH MEDIAL MENISECTOMY;  Surgeon: Kathryne Hitch, MD;  Location: [facility];  Service: Orthopedics;  Laterality: Left;  . LAPAROSCOPIC GASTRIC BANDING  02/18/2008  . LAPAROSCOPIC REPAIR AND REMOVAL OF GASTRIC BAND N/A 03/21/2017   Procedure: LAPAROSCOPIC REPAIR AND REMOVAL OF GASTRIC BAND;  Surgeon: [name], MD;  Location: [facility];  Service: General;  Laterality: N/A;  . PARTIAL HYSTERECTOMY  09/07/2002  . PATCH ANGIOPLASTY Right 11/05/2017   Procedure: PATCH ANGIOPLASTY of right carotid artery using xenosure bovine pericardium patch;  Surgeon: [name], MD;  Location: [facility] OR;  Service: Vascular;  Laterality: Right;  . SHOULDER ARTHROSCOPY WITH SUBACROMIAL DECOMPRESSION, ROTATOR CUFF REPAIR AND BICEP TENDON REPAIR Right 11/23/2015   Procedure: RIGHT SHOULDER ARTHROSCOPY WITH DEBRIDEMENT, SUBACROMIAL DECOMPRESSION, DISTAL CLAVICLE EXCISION, ROTATOR CUFF REPAIR AND BICEP TENODESIS;  Surgeon: Ninetta Lights, MD;   Location: [facility];  Service: Orthopedics;  Laterality: Right;    Social History:  Social History   Socioeconomic History  . Marital status: Divorced    Spouse name: Not on file  . Number of children: Not on file  . Years of education: Not on file  . Highest education level: Not on file  Occupational History  . Not on file  Social Needs  . Financial resource strain: Not on file  . Food insecurity:    Worry: Not on file    Inability: Not on file  . Transportation needs:    Medical: Not on file    Non-medical: Not on file  Tobacco Use  . Smoking status: Former Smoker    Packs/day: 1.00    Years: 35.00    Pack years: 35.00    Types: Cigarettes    Last attempt to quit: 10/27/2017    Years since quitting: 0.0  . Smokeless tobacco: Never Used  . Tobacco comment: in process of quiting only 1 cigeratte today  Substance and Sexual Activity  . Alcohol use: Yes    Comment: occasionally  . Drug use: No  . Sexual activity: Not on file  Lifestyle  . Physical activity:    Days per week: Not on file  Minutes per session: Not on file  . Stress: Not on file  Relationships  . Social connections:    Talks on phone: Not on file    Gets together: Not on file    Attends religious service: Not on file    Active member of club or organization: Not on file    Attends meetings of clubs or organizations: Not on file    Relationship status: Not on file  . Intimate partner violence:    Fear of current or ex partner: Not on file    Emotionally abused: Not on file    Physically abused: Not on file    Forced sexual activity: Not on file  Other Topics Concern  . Not on file  Social History Narrative  . Not on file    Family History:  Family History  Problem Relation Age of Onset  . CVA Other   . Diabetes Other   . CAD Other   . CVA Mother   . Cancer Neg Hx     Medications:   Current Outpatient Medications on File Prior to Visit  Medication Sig Dispense Refill   . acetaminophen (TYLENOL) 500 MG tablet Take 1,000 mg by mouth daily as needed for moderate pain or headache.    Marland Kitchen aspirin 325 MG tablet Take 1 tablet (325 mg total) by mouth daily. 30 tablet 0  . atorvastatin (LIPITOR) 20 MG tablet Take 1 tablet (20 mg total) by mouth daily at 6 PM. 30 tablet 1  . diltiazem (CARDIZEM CD) 180 MG 24 hr capsule Take 1 capsule by mouth daily.    Marland Kitchen diltiazem (DILACOR XR) 180 MG 24 hr capsule Take 180 mg by mouth at bedtime.    Marland Kitchen levothyroxine (SYNTHROID, LEVOTHROID) 125 MCG tablet Take 125 mcg by mouth daily before breakfast.    . Multiple Vitamin (MULTIVITAMIN WITH MINERALS) TABS tablet Take 1 tablet by mouth daily.    . nicotine (NICODERM CQ - DOSED IN MG/24 HOURS) 21 mg/24hr patch Place 1 patch (21 mg total) onto the skin daily. 28 patch 0  . omeprazole (PRILOSEC) 40 MG capsule Take 1 capsule (40 mg total) by mouth daily. 30 capsule 0  . senna-docusate (SENOKOT-S) 8.6-50 MG tablet Take 1 tablet by mouth at bedtime as needed for mild constipation. (Patient taking differently: Take 2 tablets by mouth daily. ) 30 tablet 0  . tolterodine (DETROL LA) 4 MG 24 hr capsule Take 1 capsule (4 mg total) by mouth daily.     No current facility-administered medications on file prior to visit.     Allergies:   Allergies  Allergen Reactions  . Bee Venom Shortness Of Breath and Swelling     Physical Exam  Vitals:   11/28/17 0820  BP: 121/75  Pulse: 67  Weight: 234 lb 3.2 oz (106.2 kg)  Height: 5\' 6"  (1.676 m)   Body mass index is 37.8 kg/m. No exam data present  General: well developed, well nourished, pleasant middle-aged Caucasian female, seated, in no evident distress Head: head normocephalic and atraumatic.   Neck: supple with no carotid or supraclavicular bruits Cardiovascular: regular rate and rhythm, no murmurs Musculoskeletal: no deformity Skin:  no rash/petichiae; CEA incision healing well without signs of infection Vascular:  Normal pulses all  extremities  Neurologic Exam Mental Status: Awake and fully alert. Mild dysarthria. Oriented to place and time. Recent and remote memory intact. Attention span, concentration and fund of knowledge appropriate. Mood and affect appropriate.  Cranial Nerves: Fundoscopic exam  reveals sharp disc margins. Pupils equal, briskly reactive to light. Extraocular movements full without nystagmus. Visual fields full to confrontation. Hearing intact. Facial sensation intact. Tongue deviation to the right.  Motor: Normal bulk and tone. Normal strength in all tested extremity muscles. Sensory.: intact to touch , pinprick , position and vibratory sensation.  Coordination: Rapid alternating movements normal in all extremities. Finger-to-nose and heel-to-shin performed accurately bilaterally. Gait and Station: Arises from chair without difficulty. Stance is normal. Gait demonstrates normal stride length and balance. Able to heel, toe and tandem walk without difficulty.  Reflexes: 1+ and symmetric. Toes downgoing.    NIHSS  0 Modified Rankin  1    Diagnostic Data (Labs, Imaging, Testing)  CT HEAD WO CONTRAST 10/27/2017 IMPRESSION: Small RIGHT parietal RIGHT parietal subtle hypodensity may be extra-axial. Given adjacent dense cortical vein, venous infarct is possible versus small mass. Recommend MRI of the head with and without contrast and contrast enhanced MRV.  CT ANGIO HEAD W OR WO CONTRAST CT ANGIO NECK W OR WO CONTRAST 10/28/2017 IMPRESSION: 1. Occlusion of the midportion of the left PCA P2 segment and severe stenosis of the proximal right PCA P2 segment. 2. No other occlusion or hemodynamically significant stenosis of the intracranial arteries. 3. 50% stenosis of the proximal right ICA. 20% stenosis of the proximal left ICA.  MR BRAIN W WO CONTRAST MR MRV HEAD WO CONTRAST 10/27/2017 IMPRESSION: 1. Several small scattered foci of acute and subacute infarction (mixed age) are present in  the right parietal lobe and the right occipitotemporal junction. No acute hemorrhage. No significant mass effect. 2. Normal MRV.  No evidence of dural venous sinus thrombosis.  ECHOCARDIOGRAM 10/28/2017 Study Conclusions - Left ventricle: The cavity size was normal. There was mild   concentric hypertrophy. Systolic function was normal. The   estimated ejection fraction was in the range of 60% to 65%. Wall   motion was normal; there were no regional wall motion   abnormalities. Doppler parameters are consistent with abnormal   left ventricular relaxation (grade 1 diastolic dysfunction). - Left atrium: The atrium was mildly dilated.    ASSESSMENT: Angela Abbott is a 57 y.o. year old female here with small right parietal and occipital infarcts on 10/27/2017 secondary to right ICA stenosis.  Underwent right CEA on 11/05/2017.  Vascular risk factors include HTN, HLD, tobacco use and EtOH use.  Patient is being seen today for hospital follow-up and has been recovering well with only residual speech and swallowing difficulties due to right tongue deviation due to low hypoglossal nerve which was extensively mobilized during procedure.    PLAN:  1. Right parietal and occipital infarcts: Continue aspirin 81 mg daily  and atorvastatin for secondary stroke prevention. Maintain strict control of hypertension with blood pressure goal below 130/90, diabetes with hemoglobin A1c goal below 6.5% and cholesterol with LDL cholesterol (bad cholesterol) goal below 70 mg/dL.  I also advised the patient to eat a healthy diet with plenty of whole grains, cereals, fruits and vegetables, exercise regularly with at least 30 minutes of continuous activity daily and maintain ideal body weight. 2. Post right CEA procedure: Advised to follow with [facility] as scheduled for repeat imaging in 6 months time.  Patient will be starting speech therapy for continued dysarthria and dysphasia postprocedure 3. HTN: Advised to continue  current treatment regimen.  Today's BP 121/72.  Advised to continue to monitor at home along with continued follow-up with PCP for management 4. HLD: Advised to continue current treatment  regimen along with continued follow-up with PCP for future prescribing and monitoring of lipid panel 5. Tobacco/EtOH use: Congratulated on smoking cessation along with limiting alcohol use    Follow up in 3 months or call earlier if needed   Greater than 50% of time during this 25 minute visit was spent on counseling, explanation of diagnosis of right parietal and occipital infarcts, reviewing risk factor management of HTN, HLD, tobacco and EtOH use, planning of further management along with potential future management, and discussion with patient and family answering all questions.    Venancio Poisson, AGNP-BC  [facility] [address] [place], [place] [address]  Phone [phone] Fax [phone] Note: This document was prepared with digital dictation and possible smart phrase technology. Any transcriptional errors that result from this process are unintentional.

## 2017-11-28 ENCOUNTER — Encounter: Payer: Self-pay | Admitting: Adult Health

## 2017-11-28 ENCOUNTER — Ambulatory Visit: Payer: 59 | Admitting: Adult Health

## 2017-11-28 VITALS — BP 121/75 | HR 67 | Ht 66.0 in | Wt 234.2 lb

## 2017-11-28 DIAGNOSIS — I63411 Cerebral infarction due to embolism of right middle cerebral artery: Secondary | ICD-10-CM | POA: Diagnosis not present

## 2017-11-28 DIAGNOSIS — E785 Hyperlipidemia, unspecified: Secondary | ICD-10-CM | POA: Diagnosis not present

## 2017-11-28 DIAGNOSIS — I10 Essential (primary) hypertension: Secondary | ICD-10-CM

## 2017-11-28 DIAGNOSIS — K148 Other diseases of tongue: Secondary | ICD-10-CM | POA: Diagnosis not present

## 2017-11-28 NOTE — Patient Instructions (Signed)
Continue aspirin 325 mg daily  and lipitor 20mg   for secondary stroke prevention  Continue to follow up with PCP regarding cholesterol and blood pressure management   Start speech therapy for continued speech deficits  Follow with vascular surgery as scheduled   Continue to monitor blood pressure at home  Maintain strict control of hypertension with blood pressure goal below 130/90, diabetes with hemoglobin A1c goal below 6.5% and cholesterol with LDL cholesterol (bad cholesterol) goal below 70 mg/dL. I also advised the patient to eat a healthy diet with plenty of whole grains, cereals, fruits and vegetables, exercise regularly and maintain ideal body weight.  Followup in the future with me in 3 months or call earlier if needed       Thank you for coming to see [place] at [facility]. I hope we have been able to provide you high quality care today.  You may receive a patient satisfaction survey over the next few weeks. We would appreciate your feedback and comments so that we may continue to improve ourselves and the health of our patients.

## 2017-12-01 ENCOUNTER — Other Ambulatory Visit: Payer: Self-pay

## 2017-12-01 ENCOUNTER — Ambulatory Visit (HOSPITAL_COMMUNITY): Payer: 59 | Attending: Physician Assistant | Admitting: Speech Pathology

## 2017-12-01 ENCOUNTER — Telehealth (HOSPITAL_COMMUNITY): Payer: Self-pay | Admitting: Family Medicine

## 2017-12-01 DIAGNOSIS — R471 Dysarthria and anarthria: Secondary | ICD-10-CM

## 2017-12-01 DIAGNOSIS — R1312 Dysphagia, oropharyngeal phase: Secondary | ICD-10-CM | POA: Insufficient documentation

## 2017-12-01 NOTE — Therapy (Signed)
[place], [place], [address] Phone: [phone]   Fax:  [phone]  Speech Language Pathology Evaluation  Patient Details  Name: Angela Abbott MRN: [id number] Date of Birth: 03/16/1960 Referring Provider (SLP): Cory Munch, [place]   Encounter Date: 12/01/2017  End of Session - 12/01/17 1411    Visit Number  1    Number of Visits  8    Date for SLP Re-Evaluation  12/29/17    Authorization Type  [employer]   17$ copay ; 60 visits covered per calendar year PT/OT/ST combined   SLP Start Time  1308    SLP Stop Time   1355    SLP Time Calculation (min)  47 min    Activity Tolerance  Patient tolerated treatment well       Past Medical History:  Diagnosis Date  . Arthritis    shoulders, knees, hips  . Carotid artery occlusion   . Dental crowns present   . GERD (gastroesophageal reflux disease)   . Hypertension   . Hypothyroidism   . Left knee pain 07/2014  . Pneumonia    hx of 02/2017   . Right knee pain   . Stress incontinence   . Stroke [facility])    speech difficulty recalling words  . Wears partial dentures    upper and lower    Past Surgical History:  Procedure Laterality Date  . ABDOMINAL HYSTERECTOMY     partial  . CAROTID ENDARTERECTOMY    . [place]  . ENDARTERECTOMY Right 11/05/2017   Procedure: ENDARTERECTOMY CAROTID RIGHT;  Surgeon: [name], MD;  Location: [facility] OR;  Service: Vascular;  Laterality: Right;  . ESOPHAGOGASTRODUODENOSCOPY N/A 11/26/2012   Procedure: ESOPHAGOGASTRODUODENOSCOPY (EGD);  Surgeon: Shann Medal, MD;  Location: [facility];  Service: General;  Laterality: N/A;  . GANGLION CYST EXCISION Right 02/2008   foot  . KNEE ARTHROSCOPY WITH LATERAL MENISECTOMY Left 07/21/2014   Procedure: LEFT KNEE ARTHROSCOPY,  CHONDROPLASTY, WITH LATERAL AND MEDIAL  MENISCECTOMIES;  Surgeon: Kathryne Hitch, MD;  Location: [place];  Service:  Orthopedics;  Laterality: Left;  . KNEE ARTHROSCOPY WITH MEDIAL MENISECTOMY Left 07/21/2014   Procedure: KNEE ARTHROSCOPY WITH MEDIAL MENISECTOMY;  Surgeon: Kathryne Hitch, MD;  Location: [place];  Service: Orthopedics;  Laterality: Left;  . LAPAROSCOPIC GASTRIC BANDING  02/18/2008  . LAPAROSCOPIC REPAIR AND REMOVAL OF GASTRIC BAND N/A 03/21/2017   Procedure: LAPAROSCOPIC REPAIR AND REMOVAL OF GASTRIC BAND;  Surgeon: [name], MD;  [facility];  Service: General;  Laterality: N/A;  . PARTIAL HYSTERECTOMY  09/07/2002  . PATCH ANGIOPLASTY Right 11/05/2017   Procedure: PATCH ANGIOPLASTY of right carotid artery using xenosure bovine pericardium patch;  Surgeon: [name], MD;  Location: [place] [facility] OR;  Service: Vascular;  Laterality: Right;  . SHOULDER ARTHROSCOPY WITH SUBACROMIAL DECOMPRESSION, ROTATOR CUFF REPAIR AND BICEP TENDON REPAIR Right 11/23/2015   Procedure: RIGHT SHOULDER ARTHROSCOPY WITH DEBRIDEMENT, SUBACROMIAL DECOMPRESSION, DISTAL CLAVICLE EXCISION, ROTATOR CUFF REPAIR AND BICEP TENODESIS;  Surgeon: Ninetta Lights, MD;  Location: [place];  Service: Orthopedics;  Laterality: Right;    There were no vitals filed for this visit.  Subjective Assessment - 12/01/17 1315    Subjective  Every day that I get up and have breath in my body is a good morning    Currently in Pain?  No/denies         SLP Evaluation  [facility] - 12/01/17 1315      SLP Visit Information   SLP Received On  12/01/17    Referring Provider (SLP)  Cory Munch, PA-C    Onset Date  10/27/2017 Acute CVA    Medical Diagnosis  CVA      General Information   HPI  57 y.o. female with medical history significant for hypothyroidism and obesity discharged from the hospital on 23 October after being evaluated for stroke, returned 10/26 with recurrent symptoms of left upper extremity tingling and numbness with word finding difficulty and articulation.  She was readmitted for  evaluation of new stroke.  MRI did not show a new stroke at that time.  Vascular surgery consulted; pt underwent right CEA 10/30; post op with right tongue deviation secondary to manipulation of hypoglossal nerve that was necessary to get adequate exposure to artery per notes. Pt requested ST consult and was referred by PCP for evaluation of dysarthria and/or dysphagia.    Behavioral/Cognition  pleasant, motivated and cooperative    Mobility Status  ambulated without assistive device to ST evaluation room      Balance Screen   Has the patient fallen in the past 6 months  No      Prior Functional Status   Cognitive/Linguistic Baseline  Within functional limits    Type of Home  House     Lives With  Family      Cognition   Overall Cognitive Status  Within Functional Limits for tasks assessed      Auditory Comprehension   Overall Auditory Comprehension  Appears within functional limits for tasks assessed      Visual Recognition/Discrimination   Discrimination  Not tested      Reading Comprehension   Reading Status  Not tested      Expression   Primary Mode of Expression  Verbal      Verbal Expression   Overall Verbal Expression  Appears within functional limits for tasks assessed      Oral Motor/Sensory Function   Overall Oral Motor/Sensory Function  Impaired    Labial ROM  Within Functional Limits    Labial Symmetry  Within Functional Limits    Labial Strength  Within Functional Limits    Labial Sensation  Within Functional Limits    Labial Coordination  Reduced    Lingual ROM  Reduced right    Lingual Symmetry  Abnormal symmetry right    Lingual Strength  Reduced    Lingual Sensation  Reduced    Lingual Coordination  Reduced    Facial ROM  Within Functional Limits    Facial Symmetry  Within Functional Limits    Facial Strength  Within Functional Limits    Facial Sensation  Reduced    Facial Coordination  WFL    Velum  Within Functional Limits    Mandible  Within  Functional Limits      Motor Speech   Overall Motor Speech  Impaired    Articulation  Impaired    Level of Impairment  Conversation    Intelligibility  Intelligibility reduced    Word  75-100% accurate    Phrase  75-100% accurate    Sentence  75-100% accurate    Conversation  75-100% accurate                      SLP Education - 12/01/17 1406    Education Details  Provided dysarthria HEP and oral and lingual ROM HEP  SLP Short Term Goals - 12/01/17 1413      SLP SHORT TERM GOAL #1   Title  Pt will reportedly complete dysarthria and dysphagia HEP 2/day over 4 sessions    Time  3    Period  Weeks    Status  New      SLP SHORT TERM GOAL #2   Title  Pt will utilize strategies to facilitate clearance of oral residue from R lingual surface and pocket with mod I during trials over 3 sessions    Time  3    Period  Weeks    Status  New      SLP SHORT TERM GOAL #3   Title  Pt will utilized dysarthria strategies during conversation with mod I with 100% speech intelligibility over 4 sessions    Time  3    Period  Weeks    Status  New      SLP SHORT TERM GOAL #4   Title  Pt will name 3 s/sx of aspiration PNA         Plan - 12/01/17 1445    Clinical Impression Statement Clinical swallow evaluation completed during session. Pt presents with moderate oral phase dysphagia. Oral motor examination reveals suspected CN XII involvement with reduced lingual strength, right lingual asymmetry, and reduced lingual ROM to right posterior oral cavity. Pt without overt signs or symptoms of aspiration, however se does have significant difficulty with bolus manipulation across textures/consistencies and also presents with dysarthria due to lingual weakness. Pt reports no difficulty with liquids and no overt s/sx of aspiration were noted with liquids administered this date.   SLP discussed a watch and wait approach with emphasis on lingual range of motion exercises and modified  diet textures (mechanical soft) versus completing MBSS now to objectively evaluated oropharyngeal swallow, as there may be pharyngeal involvement which is not apparent clinically today. Pt reports no recent URI, shortness of breath, fever, or congestion and opts to defer the MBSS at this time. SLP provided Pt/spouse with written information including aspiration precautions which included s/sx of aspiration pna. If his vascular surgeon would like to complete MBSS to identify any pharyngeal involvement, please order and we can complete at [facility].   Furthermore, Pt presents with mild dyarthria characterized by decreased articulatory precision secondary to lingual weakness. Pt's speech is largely intelligible (90-95%) and she does independently compensate by slowing her rate, however, there were times during prolonged conversation, that single words were unintelligible when speech became more rapid. Pt will benefit from skilled ST to address dysarthria and dysphagia to implement compensatory strategies and provide education to decrease aspiration risk and improve intelligibility, decrease frustration and improve pt's overall QOL.     Speech Therapy Frequency  2x / week    Duration  4 weeks    Treatment/Interventions  Aspiration precaution training;Compensatory strategies;Oral motor exercises;Diet toleration management by SLP;Compensatory techniques;Internal/external aids;SLP instruction and feedback    Potential to Achieve Goals  Good    SLP Home Exercise Plan  dysarthria and dysphagia       Patient will benefit from skilled therapeutic intervention in order to improve the following deficits and impairments:   Dysarthria and anarthria  Dysphagia, oropharyngeal phase    Problem List Patient Active Problem List   Diagnosis Date Noted  . Numbness 11/01/2017  . TIA (transient ischemic attack) 11/01/2017  . Sensory disturbance 11/01/2017  . Acute ischemic stroke ([place]) 11/01/2017  . CVA  (cerebral vascular accident) ([place]) 10/27/2017  .  Tobacco abuse 10/27/2017  . Chest pain 02/14/2017  . Multifocal pneumonia 02/14/2017  . Hypothyroidism 02/14/2017  . GERD (gastroesophageal reflux disease) 02/14/2017  . Pneumonia 02/14/2017  . Cigarette smoker 11/13/2016  . Chronic cough 11/12/2016  . Sciatica of left side 05/18/2013  . Arthritis of wrist, left, degenerative 03/30/2013  . History of laparoscopic adjustable gastric banding, 02/17/2008 04/29/2012  . Morbid obesity ([place]) 01/16/2012  . PLANTAR FACIITIS 07/01/2007  . KNEE PAIN 04/29/2007  . TEAR MEDIAL MENISCUS 04/29/2007    H. Roddie Mc, CCC-SLP Speech Language Pathologist  Wende Bushy 12/01/2017, 4:18 PM  [place] [address] [place], [place], [address] Phone: [phone]   Fax:  [phone]  Name: AHMYA BERNICK MRN: [id number] Date of Birth: 1960/01/09

## 2017-12-01 NOTE — Telephone Encounter (Signed)
12/01/17  Pt called back and said it was fine for her to come in at 1:00 today

## 2017-12-01 NOTE — Telephone Encounter (Signed)
12/01/17  9:21 am   I left her a message asking if she could come in at 1pm today since [place] isn't in the office this morning.

## 2017-12-01 NOTE — Progress Notes (Signed)
I agree with the above plan 

## 2017-12-01 NOTE — Patient Instructions (Signed)
Speech Exercises  Do 2 times, 2 times a day  Call the cat "Buttercup" A calendar of [place], [place] Four floors to cover Yellow oil ointment Fellow lovers of felines Catastrophe in [place]' plums The church's chimes chimed Telling time 'til eleven Five valve levers Keep the gate closed Go see that guy Fat cows give milk [employer] Gophers Fat frogs flip freely Kohl's into bed Get that game to [employer] Thick thistles stick together Cinnamon aluminum linoleum Black bugs blood Lovely lemon linament Red leather, yellow leather  Big grocery buggy    Purple baby carriage [facility] Proper copper coffee pot Ripe purple cabbage Three free throws [employer] tackled  [employer] dipped the dessert  [place] that [employer] of [employer] Shirts shrink, shells shouldn't [place] 49ers Take the tackle box File the flash message Give me five flapjacks Fundamental relatives Dye the pets purple Talking [place] time after time Dark chocolate chunks Political landscape of the kingdom Estate manager/land agent genius We played yo-yos yesterday    Tongue Exercises  1)  - Run your tongue along the lower L pocket across to the R pocket UP to the R upper pocket and over to the Upper L pocket      -  complete 10 times Twice a day  2)Stick your tongue out as far as you can - 10 times TWICE a day

## 2017-12-03 ENCOUNTER — Ambulatory Visit (HOSPITAL_COMMUNITY): Payer: 59 | Admitting: Speech Pathology

## 2017-12-03 DIAGNOSIS — R471 Dysarthria and anarthria: Secondary | ICD-10-CM

## 2017-12-03 DIAGNOSIS — R1312 Dysphagia, oropharyngeal phase: Secondary | ICD-10-CM

## 2017-12-03 NOTE — Therapy (Signed)
[place], [place], [address] Phone: [phone]   Fax:  [phone]  Speech Language Pathology Treatment  Patient Details  Name: Angela Abbott MRN: [id number] Date of Birth: 1960-03-14 Referring Provider (SLP): Cory Munch, [place]   Encounter Date: 12/03/2017  End of Session - 12/03/17 1131    Visit Number  2    Number of Visits  8    Date for SLP Re-Evaluation  12/29/17    Authorization Type  [employer]HC   17$ copay ; 60 visits covered per calendar year PT/OT/ST combined   SLP Start Time  1035    SLP Stop Time   1115    SLP Time Calculation (min)  40 min    Activity Tolerance  Patient tolerated treatment well       Past Medical History:  Diagnosis Date  . Arthritis    shoulders, knees, hips  . Carotid artery occlusion   . Dental crowns present   . GERD (gastroesophageal reflux disease)   . Hypertension   . Hypothyroidism   . Left knee pain 07/2014  . Pneumonia    hx of 02/2017   . Right knee pain   . Stress incontinence   . Stroke [facility])    speech difficulty recalling words  . Wears partial dentures    upper and lower    Past Surgical History:  Procedure Laterality Date  . ABDOMINAL HYSTERECTOMY     partial  . CAROTID ENDARTERECTOMY    . [place]  . ENDARTERECTOMY Right 11/05/2017   Procedure: ENDARTERECTOMY CAROTID RIGHT;  Surgeon: [name], MD;  Location: [facility] OR;  Service: Vascular;  Laterality: Right;  . ESOPHAGOGASTRODUODENOSCOPY N/A 11/26/2012   Procedure: ESOPHAGOGASTRODUODENOSCOPY (EGD);  Surgeon: Shann Medal, MD;  Location: [facility];  Service: General;  Laterality: N/A;  . GANGLION CYST EXCISION Right 02/2008   foot  . KNEE ARTHROSCOPY WITH LATERAL MENISECTOMY Left 07/21/2014   Procedure: LEFT KNEE ARTHROSCOPY,  CHONDROPLASTY, WITH LATERAL AND MEDIAL  MENISCECTOMIES;  Surgeon: Kathryne Hitch, MD;  Location: [place];  Service: Orthopedics;   Laterality: Left;  . KNEE ARTHROSCOPY WITH MEDIAL MENISECTOMY Left 07/21/2014   Procedure: KNEE ARTHROSCOPY WITH MEDIAL MENISECTOMY;  Surgeon: Kathryne Hitch, MD;  Location: [place];  Service: Orthopedics;  Laterality: Left;  . LAPAROSCOPIC GASTRIC BANDING  02/18/2008  . LAPAROSCOPIC REPAIR AND REMOVAL OF GASTRIC BAND N/A 03/21/2017   Procedure: LAPAROSCOPIC REPAIR AND REMOVAL OF GASTRIC BAND;  Surgeon: [name], MD;  [facility];  Service: General;  Laterality: N/A;  . PARTIAL HYSTERECTOMY  09/07/2002  . PATCH ANGIOPLASTY Right 11/05/2017   Procedure: PATCH ANGIOPLASTY of right carotid artery using xenosure bovine pericardium patch;  Surgeon: [name], MD;  Location: [place] OR;  Service: Vascular;  Laterality: Right;  . SHOULDER ARTHROSCOPY WITH SUBACROMIAL DECOMPRESSION, ROTATOR CUFF REPAIR AND BICEP TENDON REPAIR Right 11/23/2015   Procedure: RIGHT SHOULDER ARTHROSCOPY WITH DEBRIDEMENT, SUBACROMIAL DECOMPRESSION, DISTAL CLAVICLE EXCISION, ROTATOR CUFF REPAIR AND BICEP TENODESIS;  Surgeon: Ninetta Lights, MD;  Location: [place];  Service: Orthopedics;  Laterality: Right;    There were no vitals filed for this visit.  Subjective Assessment - 12/03/17 1046    Subjective  "It is yucky outside"    Currently in Pain?  No/denies            ADULT SLP TREATMENT - 12/03/17 0001  General Information   Behavior/Cognition  Alert;Cooperative;Pleasant mood    Patient Positioning  Upright in chair      Treatment Provided   Treatment provided  Dysphagia;Cognitive-Linquistic      Dysphagia Treatment   Temperature Spikes Noted  Yes    Respiratory Status  Room air    Oral Cavity - Dentition  Adequate natural dentition    Treatment Methods  Compensation strategy training;Skilled observation;Patient/caregiver education    Patient observed directly with PO's  Yes    Type of PO's observed  Regular;Thin liquids    Feeding  Able to feed self     Liquids provided via  Cup    Oral Phase Signs & Symptoms  Prolonged bolus formation    Amount of cueing  Independent    Other treatment/comments  Skilled dysphagia therapy targeting trials of regular textures with implementation of compensatory strategies to facilitate clearance of oral residue. Pt demonstrated utilization of compensatory strategies with trials independent of cues demonstrating good clearance of bolus on the R side; without strategies during evaluation moderate oral residue was noted on lingual surface. Pt occasionally required liquid wash to clear residue but frequently cleared it with lingual sweep and lingual press and movement to the roof of her mouth. Pt completed oral motor ROM HEP with 100% accuracy.      Pain Assessment   Pain Assessment  No/denies pain      Cognitive-Linquistic Treatment   Treatment focused on  Dysarthria    Skilled Treatment  Last 15 minutes of skilled ST session were targeting dysarthria; Pt completed structured task of reading challenging alliteration passages with over-articulation. Pt completed task with 100% intelligibility. During non-structured conversation, Pt's speech was 90% intelligible with occasional decreased articulatory precision when rate increased; visual cue was benefitial for Pt to repeat or slow down. Plan for pt to come to one more OP ST session and d/c secondary to noted improvement, accurate completion of HEP and education provided/concerns addressed.      Assessment / Recommendations / Plan   Plan  Continue with current plan of care      Dysphagia Recommendations   Diet recommendations  Regular;Thin liquid    Liquids provided via  Cup;Straw      Progression Toward Goals   Progression toward goals  Progressing toward goals       SLP Education - 12/03/17 1131    Education Details  Reviewed HEP and encouraged to continue to complete 2/day     Person(s) Educated  Patient    Methods  Explanation    Comprehension  Verbalized  understanding;Returned demonstration       SLP Short Term Goals - 12/03/17 1136      SLP SHORT TERM GOAL #1   Title  Pt will reportedly complete dysarthria and dysphagia HEP 2/day over 4 sessions    Time  3    Period  Weeks    Status  On-going      SLP SHORT TERM GOAL #2   Title  Pt will utilize strategies to facilitate clearance of oral residue from R lingual surface and pocket with mod I during trials over 3 sessions    Time  3    Period  Weeks    Status  On-going      SLP SHORT TERM GOAL #3   Title  Pt will utilized dysarthria strategies during conversation with mod I with 100% speech intelligibility over 4 sessions    Time  3    Period  Weeks    Status  On-going      SLP SHORT TERM GOAL #4   Title  Pt will name 3 s/sx of aspiration PNA    Time  3    Period  Weeks    Status  On-going         Plan - 12/03/17 1132    Clinical Impression Statement  Pt presents with mild/moderate oral phase dysphagia. Oral motor examination reveals suspected CN XII involvement with reduced lingual strength, right lingual asymmetry, and reduced lingual ROM to right posterior oral cavity. Pt also presents with mild dysarthria secondary to lingual weakness. Pt was provided HEP for dysarthria and dysphagia to complete 2/day which she reports she has been doing since eval date 11/25. Plan to continue for one more skilled ST session and to d/c secondary to pt reaching max potential, completing exercises and demonstrating good carryover of compensatory strategies. Pt agrees with SLP that her questions have been answered and that she can continue completing HEP at home after d/c from OP.     Speech Therapy Frequency  2x / week    Duration  4 weeks    Treatment/Interventions  Aspiration precaution training;Compensatory strategies;Oral motor exercises;Diet toleration management by SLP;Compensatory techniques;Internal/external aids;SLP instruction and feedback    Potential to Achieve Goals  Good    SLP  Home Exercise Plan  dysarthria and dysphagia    Consulted and Agree with Plan of Care  Patient       Patient will benefit from skilled therapeutic intervention in order to improve the following deficits and impairments:   Dysarthria and anarthria  Dysphagia, oropharyngeal phase    Problem List Patient Active Problem List   Diagnosis Date Noted  . Numbness 11/01/2017  . TIA (transient ischemic attack) 11/01/2017  . Sensory disturbance 11/01/2017  . Acute ischemic stroke ([place]) 11/01/2017  . CVA (cerebral vascular accident) ([place]) 10/27/2017  . Tobacco abuse 10/27/2017  . Chest pain 02/14/2017  . Multifocal pneumonia 02/14/2017  . Hypothyroidism 02/14/2017  . GERD (gastroesophageal reflux disease) 02/14/2017  . Pneumonia 02/14/2017  . Cigarette smoker 11/13/2016  . Chronic cough 11/12/2016  . Sciatica of left side 05/18/2013  . Arthritis of wrist, left, degenerative 03/30/2013  . History of laparoscopic adjustable gastric banding, 02/17/2008 04/29/2012  . Morbid obesity ([place]) 01/16/2012  . PLANTAR FACIITIS 07/01/2007  . KNEE PAIN 04/29/2007  . TEAR MEDIAL MENISCUS 04/29/2007    H. Roddie Mc, CCC-SLP Speech Language Pathologist  Wende Bushy 12/03/2017, 12:40 PM  [place] [address] [place], [place], [address] Phone: [phone]   Fax:  [phone]   Name: Angela Abbott MRN: [id number] Date of Birth: 02-Nov-1960

## 2017-12-08 ENCOUNTER — Telehealth (HOSPITAL_COMMUNITY): Payer: Self-pay | Admitting: Speech Pathology

## 2017-12-08 ENCOUNTER — Ambulatory Visit (HOSPITAL_COMMUNITY): Payer: 59 | Attending: Physician Assistant | Admitting: Speech Pathology

## 2017-12-08 DIAGNOSIS — R1312 Dysphagia, oropharyngeal phase: Secondary | ICD-10-CM | POA: Insufficient documentation

## 2017-12-08 DIAGNOSIS — R471 Dysarthria and anarthria: Secondary | ICD-10-CM | POA: Diagnosis not present

## 2017-12-08 NOTE — Therapy (Signed)
[place], [place], [address] Phone: [phone]   Fax:  [phone]  Speech Language Pathology Treatment and Discharge Summary  Patient Details  Name: Angela Abbott MRN: [id number] Date of Birth: [birth year] Referring Provider (SLP): Cory Munch, [place]   Encounter Date: 12/08/2017  End of Session - 12/08/17 7619    Visit Number  3    Number of Visits  8    Date for SLP Re-Evaluation  12/29/17    Authorization Type  [employer]   17$ copay ; 60 visits covered per calendar year PT/OT/ST combined   SLP Start Time  0819    SLP Stop Time   0903    SLP Time Calculation (min)  44 min    Activity Tolerance  Patient tolerated treatment well       Past Medical History:  Diagnosis Date  . Arthritis    shoulders, knees, hips  . Carotid artery occlusion   . Dental crowns present   . GERD (gastroesophageal reflux disease)   . Hypertension   . Hypothyroidism   . Left knee pain 07/2014  . Pneumonia    hx of 02/2017   . Right knee pain   . Stress incontinence   . Stroke [facility])    speech difficulty recalling words  . Wears partial dentures    upper and lower    Past Surgical History:  Procedure Laterality Date  . ABDOMINAL HYSTERECTOMY     partial  . CAROTID ENDARTERECTOMY    . [place]  . ENDARTERECTOMY Right 11/05/2017   Procedure: ENDARTERECTOMY CAROTID RIGHT;  Surgeon: [name], MD;  Location: [facility] OR;  Service: Vascular;  Laterality: Right;  . ESOPHAGOGASTRODUODENOSCOPY N/A 11/26/2012   Procedure: ESOPHAGOGASTRODUODENOSCOPY (EGD);  Surgeon: Shann Medal, MD;  Location: [facility];  Service: General;  Laterality: N/A;  . GANGLION CYST EXCISION Right 02/2008   foot  . KNEE ARTHROSCOPY WITH LATERAL MENISECTOMY Left 07/21/2014   Procedure: LEFT KNEE ARTHROSCOPY,  CHONDROPLASTY, WITH LATERAL AND MEDIAL  MENISCECTOMIES;  Surgeon: Kathryne Hitch, MD;  Location: [place];   Service: Orthopedics;  Laterality: Left;  . KNEE ARTHROSCOPY WITH MEDIAL MENISECTOMY Left 07/21/2014   Procedure: KNEE ARTHROSCOPY WITH MEDIAL MENISECTOMY;  Surgeon: Kathryne Hitch, MD;  Location: [place];  Service: Orthopedics;  Laterality: Left;  . LAPAROSCOPIC GASTRIC BANDING  02/18/2008  . LAPAROSCOPIC REPAIR AND REMOVAL OF GASTRIC BAND N/A 03/21/2017   Procedure: LAPAROSCOPIC REPAIR AND REMOVAL OF GASTRIC BAND;  Surgeon: [name], MD;  Location: [facility];  Service: General;  Laterality: N/A;  . PARTIAL HYSTERECTOMY  09/07/2002  . PATCH ANGIOPLASTY Right 11/05/2017   Procedure: PATCH ANGIOPLASTY of right carotid artery using xenosure bovine pericardium patch;  Surgeon: [name], MD;  Location: [place] OR;  Service: Vascular;  Laterality: Right;  . SHOULDER ARTHROSCOPY WITH SUBACROMIAL DECOMPRESSION, ROTATOR CUFF REPAIR AND BICEP TENDON REPAIR Right 11/23/2015   Procedure: RIGHT SHOULDER ARTHROSCOPY WITH DEBRIDEMENT, SUBACROMIAL DECOMPRESSION, DISTAL CLAVICLE EXCISION, ROTATOR CUFF REPAIR AND BICEP TENODESIS;  Surgeon: Ninetta Lights, MD;  Location: [place];  Service: Orthopedics;  Laterality: Right;    There were no vitals filed for this visit.  Subjective Assessment - 12/08/17 0830    Subjective  "had a wonderful thanksgiving"    Currently in Pain?  No/denies            ADULT SLP TREATMENT - 12/08/17 0001  General Information   Behavior/Cognition  Alert;Cooperative;Pleasant mood    Patient Positioning  Upright in chair      Treatment Provided   Treatment provided  Dysphagia;Cognitive-Linquistic      Dysphagia Treatment   Temperature Spikes Noted  No    Respiratory Status  Room air    Oral Cavity - Dentition  --   Upper and lower partial   Treatment Methods  Compensation strategy training;Skilled observation;Patient/caregiver education    Patient observed directly with PO's  Yes    Type of PO's observed  Regular;Thin liquids     Feeding  Able to feed self    Liquids provided via  Cup    Oral Phase Signs & Symptoms  Prolonged bolus formation    Amount of cueing  Independent    Other treatment/comments  Skilled dysphagia therapy targeting trials of regular textures with implementation of compensatory strategies to facilitate clearance of oral residue. Pt demonstrated utilization of compensatory strategies with trials independent of cues demonstrating good clearance of bolus on the R side. Pt occasionally required liquid wash to clear residue but frequently cleared it with lingual sweep and lingual press and movement to the roof of her mouth.       Pain Assessment   Pain Assessment  No/denies pain      Cognitive-Linquistic Treatment   Treatment focused on  Dysarthria    Skilled Treatment  Non-structured skilled dysarthria treatment provided with conversation throughout session; During non-structured conversation, Pt's speech was 100% intelligible with consistent utilization of strategies.       Assessment / Recommendations / Plan   Plan  Discharge SLP treatment due to (comment)   max potential     Dysphagia Recommendations   Diet recommendations  Regular;Thin liquid    Liquids provided via  Cup;Straw      Progression Toward Goals   Progression toward goals  Goals met, education completed, patient discharged from [facility] - 12/08/17 [phone]    Education Details  Recommend Pt continue with HEP for dysarthria and dysphagia    Person(s) Educated  Patient    Comprehension  Verbalized understanding       SLP Short Term Goals - 12/08/17 0934      SLP SHORT TERM GOAL #1   Title  Pt will reportedly complete dysarthria and dysphagia HEP 2/day over 4 sessions    Time  3    Period  Weeks    Status  Achieved      SLP SHORT TERM GOAL #2   Title  Pt will utilize strategies to facilitate clearance of oral residue from R lingual surface and pocket with mod I during trials over 3 sessions    Time  3     Period  Weeks    Status  Achieved      SLP SHORT TERM GOAL #3   Title  Pt will utilized dysarthria strategies during conversation with mod I with 100% speech intelligibility over 4 sessions    Time  3    Period  Weeks    Status  Achieved      SLP SHORT TERM GOAL #4   Title  Pt will name 3 s/sx of aspiration PNA    Time  3    Period  Weeks    Status  On-going      SPEECH THERAPY DISCHARGE SUMMARY  Visits from Start of Care: 3  Current functional level related to goals / functional outcomes:  Pt has met all targeted goals see summary above and impression statement below:   Remaining deficits: Pt presents with mild oral phase dysphagia. Oral motor examination reveals suspected CN XII involvement with reduced lingual strength, right lingual asymmetry, and reduced lingual ROM to right posterior oral cavity. Pt also presents with mild dysarthria secondary to lingual weakness. Pt was provided HEP for dysarthria and dysphagia to complete 2/day    Education: HEP provided Plan: Patient agrees to discharge.  Patient goals were met. Patient is being discharged due to meeting the stated rehab goals.  ?????       Plan - 12/08/17 0929    Clinical Impression Statement  Pt presents with mild oral phase dysphagia. Oral motor examination reveals suspected CN XII involvement with reduced lingual strength, right lingual asymmetry, and reduced lingual ROM to right posterior oral cavity. Pt also presents with mild dysarthria secondary to lingual weakness. Pt was provided HEP for dysarthria and dysphagia to complete 2/day which she reports she has been doing since eval date 11/25. Pt has met all targeted goals and is completing her HEP daily. Pt has reached max potential with skilled services; she was provided education and d/c from services this date.    Speech Therapy Frequency  2x / week    Duration  4 weeks    Treatment/Interventions  Aspiration precaution training;Compensatory strategies;Oral motor  exercises;Diet toleration management by SLP;Compensatory techniques;Internal/external aids;SLP instruction and feedback    Potential to Achieve Goals  Good    SLP Home Exercise Plan  dysarthria and dysphagia    Consulted and Agree with Plan of Care  Patient       Patient will benefit from skilled therapeutic intervention in order to improve the following deficits and impairments:   Dysarthria and anarthria  Dysphagia, oropharyngeal phase    Problem List Patient Active Problem List   Diagnosis Date Noted  . Numbness 11/01/2017  . TIA (transient ischemic attack) 11/01/2017  . Sensory disturbance 11/01/2017  . Acute ischemic stroke ([place]) 11/01/2017  . CVA (cerebral vascular accident) ([place]) 10/27/2017  . Tobacco abuse 10/27/2017  . Chest pain 02/14/2017  . Multifocal pneumonia 02/14/2017  . Hypothyroidism 02/14/2017  . GERD (gastroesophageal reflux disease) 02/14/2017  . Pneumonia 02/14/2017  . Cigarette smoker 11/13/2016  . Chronic cough 11/12/2016  . Sciatica of left side 05/18/2013  . Arthritis of wrist, left, degenerative 03/30/2013  . History of laparoscopic adjustable gastric banding, 02/17/2008 04/29/2012  . Morbid obesity ([place]) 01/16/2012  . PLANTAR FACIITIS 07/01/2007  . KNEE PAIN 04/29/2007  . TEAR MEDIAL MENISCUS 04/29/2007   Amelia H. Roddie Mc, CCC-SLP Speech Language Pathologist  Wende Bushy 12/08/2017, 9:36 AM  [place] [address] [place], [place], [address] Phone: [phone]   Fax:  [phone]   Name: Angela Abbott MRN: [id number] Date of Birth: September 17, 1960

## 2017-12-08 NOTE — Telephone Encounter (Signed)
Patient was discharged from rehab

## 2017-12-12 ENCOUNTER — Ambulatory Visit (HOSPITAL_COMMUNITY): Payer: Self-pay | Admitting: Speech Pathology

## 2017-12-15 ENCOUNTER — Ambulatory Visit (HOSPITAL_COMMUNITY): Payer: Self-pay | Admitting: Speech Pathology

## 2017-12-19 ENCOUNTER — Ambulatory Visit (HOSPITAL_COMMUNITY): Payer: Self-pay | Admitting: Speech Pathology

## 2018-01-15 DIAGNOSIS — D649 Anemia, unspecified: Secondary | ICD-10-CM | POA: Diagnosis not present

## 2018-01-15 DIAGNOSIS — D692 Other nonthrombocytopenic purpura: Secondary | ICD-10-CM | POA: Diagnosis not present

## 2018-01-15 DIAGNOSIS — Z1389 Encounter for screening for other disorder: Secondary | ICD-10-CM | POA: Diagnosis not present

## 2018-01-15 DIAGNOSIS — Z6839 Body mass index (BMI) 39.0-39.9, adult: Secondary | ICD-10-CM | POA: Diagnosis not present

## 2018-01-15 DIAGNOSIS — I776 Arteritis, unspecified: Secondary | ICD-10-CM | POA: Diagnosis not present

## 2018-01-24 DIAGNOSIS — Z1211 Encounter for screening for malignant neoplasm of colon: Secondary | ICD-10-CM | POA: Diagnosis not present

## 2018-03-06 ENCOUNTER — Ambulatory Visit: Payer: 59 | Admitting: Adult Health

## 2018-03-30 DIAGNOSIS — M545 Low back pain: Secondary | ICD-10-CM | POA: Diagnosis not present

## 2018-03-30 DIAGNOSIS — M25562 Pain in left knee: Secondary | ICD-10-CM | POA: Diagnosis not present

## 2018-03-30 DIAGNOSIS — Z6839 Body mass index (BMI) 39.0-39.9, adult: Secondary | ICD-10-CM | POA: Diagnosis not present

## 2018-04-15 DIAGNOSIS — M25562 Pain in left knee: Secondary | ICD-10-CM | POA: Diagnosis not present

## 2018-04-15 DIAGNOSIS — M25561 Pain in right knee: Secondary | ICD-10-CM | POA: Diagnosis not present

## 2018-04-22 DIAGNOSIS — M170 Bilateral primary osteoarthritis of knee: Secondary | ICD-10-CM | POA: Diagnosis not present

## 2018-04-29 DIAGNOSIS — M170 Bilateral primary osteoarthritis of knee: Secondary | ICD-10-CM | POA: Diagnosis not present

## 2018-05-06 DIAGNOSIS — M170 Bilateral primary osteoarthritis of knee: Secondary | ICD-10-CM | POA: Diagnosis not present

## 2018-05-27 ENCOUNTER — Telehealth: Payer: Self-pay

## 2018-05-27 NOTE — Telephone Encounter (Signed)
Rn text link to pts cell phone that has [employer] as the carrier.

## 2018-05-27 NOTE — Telephone Encounter (Signed)
Unable to reach pt vm not set up.Could not leave message about visit being change to video.

## 2018-05-27 NOTE — Telephone Encounter (Signed)
Pt understands that although there may be some limitations with this type of visit, we will take all precautions to reduce any security or privacy concerns.  Pt understands that this will be treated like an in office visit and we will file with pt's insurance, and there may be a patient responsible charge related to this service.  Pt carrier is [employer]. Link sent to confirmed smartphone number. Pt informed copay will be billed.

## 2018-05-28 ENCOUNTER — Telehealth: Payer: Self-pay | Admitting: Adult Health

## 2018-05-28 ENCOUNTER — Ambulatory Visit: Payer: 59 | Admitting: Adult Health

## 2018-05-28 NOTE — Telephone Encounter (Signed)
FYI. Pt called saying she " Can't download the link for her appt" I informed the pt that there was no app download needed that all she had to do was click on the link that was texted to her, put in name and wait in Virtual waiting room. Pt stated "well im already at work so im gonna have to r/s anyway's." Pt was r/s.

## 2018-06-24 NOTE — Telephone Encounter (Signed)
I called pt to get verbal consent to do video and to file insurance. Pt was r/s and consent was not obtained again. PT gave verbal consent to doxy video and to file insurance. Pt stated she had link. Pt knows to click link 10 min early before 745 appt. I updated meds, pcp and pharmacy.

## 2018-06-25 ENCOUNTER — Ambulatory Visit (INDEPENDENT_AMBULATORY_CARE_PROVIDER_SITE_OTHER): Payer: 59 | Admitting: Adult Health

## 2018-06-25 ENCOUNTER — Other Ambulatory Visit: Payer: Self-pay

## 2018-06-25 ENCOUNTER — Encounter: Payer: Self-pay | Admitting: Adult Health

## 2018-06-25 DIAGNOSIS — E785 Hyperlipidemia, unspecified: Secondary | ICD-10-CM

## 2018-06-25 DIAGNOSIS — I10 Essential (primary) hypertension: Secondary | ICD-10-CM

## 2018-06-25 DIAGNOSIS — I63411 Cerebral infarction due to embolism of right middle cerebral artery: Secondary | ICD-10-CM

## 2018-06-25 NOTE — Progress Notes (Signed)
[name] [address] [place]. [place] [address] [phone]       FOLLOW UP NOTE  [name] Date of Birth:  11-17-1960 Medical Record Number:  [id number]   Reason for Referral:  hospital stroke follow up  Virtual Visit via Video Note  I connected with Angela Abbott on 06/25/18 at  7:45 AM EDT by a video enabled telemedicine application located remotely in my own home and verified that I am speaking with the correct person using two identifiers who was located at their own home.   I discussed the limitations of evaluation and management by telemedicine and the availability of in person appointments. The patient expressed understanding and agreed to proceed.    HPI: 06/25/18 VIRTUAL VISIT  She is being seen today for follow-up regarding stroke s/p right CEA in 10/2017 she has been stable from a stroke standpoint with improvement of swallowing and only very mild residual dysarthria.  She continues on aspirin 325 mg and atorvastatin without reported side effects.  Blood pressure not routinely monitored at home is typically stable.  She has not followed up with vascular surgery Dr. Trula Slade as recommended with a six-month follow-up which should have been around 05/2018 for repeat imaging.  She does endorse improvement of it prior numbness around procedure site but has been experiencing numbness slightly further up along her jawline.  No further concerns at this time.  Denies new or worsening stroke/TIA symptoms.     Initial visit 11/24/2017: Angela Abbott is being seen today for initial visit in the office for right parietal and occipital infarcts on 10/27/2017 secondary to right ICA stenosis and underwent right CEA on 11/05/2017. History obtained from patient and chart review. Reviewed all radiology images and labs personally.  Angela Abbott is a 58 y.o. female with history of HTN, GERD, tobacco use and etoh use  who presented with L sided  numbness and weakness.  CT head reviewed and showed right parietal hypodensity.  MRI brain reviewed and showed several small acute and subacute right parietal and right occipital temporal junction infarcts.  MRV normal.  CTA head and neck showed occluded left P2 and severe stenosis right P2 with right ICA 50% stenosis and left ICA 20% stenosis.  Carotid Dopplers showed right ICA 80-99% stenosis and left ICA 40 to 59% stenosis.  2D echo showed an EF of 60 to 65% without cardiac source of embolus identified.  HTN initially elevated but stabilized throughout admission and recommended long-term BP goal normotensive range.  LDL 20 with recommendation of continuation of atorvastatin.  Due to symptomatic right ICA high-grade stenosis, was recommended for vascular surgery to consider right CEA.  Recommended DAPT for 3 weeks and aspirin alone. Patient return to ED on 11/01/2017 with recurrent symptoms of left upper extremity tingling and numbness with word finding difficulty and articulation.  MRI brain reviewed and was negative for acute infarct.  Patient did undergo right carotid enterectomy on 53/97/6734 without complication.  Patient did have follow-up with [facility] Dr. Trula Slade on 11/24/2017 with recommendations of continuing aspirin only and discontinue Plavix with follow-up carotid ultrasound in 6 months time.  Patient is being seen today for hospital follow-up. She is recovering well without residual weakness. She does endorse continued speaking difficulties and swallowing difficulties that occurred due to low hypoglossal nerve which was extensively mobilized in order to perform right CEA and was found to have tongue deviation postoperatively.  She does believe this has been improving and  plans on participating in speech therapy next week.  She does endorse mild numbness at right CEA site but incision healing well without evidence of infection.  She has been eating healthier and consider doing plant based diet. She has  quit smoking completely. She plans on returning to work on 12/22/17 at [employer]. Continues to aspirin 325mg  without bleeding but does endorse mild bruising. Continues to take atorvastatin 20mg  without side effects of myalgias. Blood pressure today 121/75. She has discontinued estrogen therapy.  She is very motivated and changing her ways of living to overall be healthier and prevent future strokes.  She has returned back to all prior activities and feels as though she will be ready to return to work on the schedule goal date.  No further concerns at this time.  Denies new or worsening stroke/TIA symptoms.   ROS:   14 system review of systems performed and negative with exception of speech difficulty and numbness  PMH:  Past Medical History:  Diagnosis Date  . Arthritis    shoulders, knees, hips  . Carotid artery occlusion   . Dental crowns present   . GERD (gastroesophageal reflux disease)   . Hypertension   . Hypothyroidism   . Left knee pain 07/2014  . Pneumonia    hx of 02/2017   . Right knee pain   . Stress incontinence   . Stroke [facility])    speech difficulty recalling words  . Wears partial dentures    upper and lower    PSH:  Past Surgical History:  Procedure Laterality Date  . ABDOMINAL HYSTERECTOMY     partial  . CAROTID ENDARTERECTOMY    . [place]  . ENDARTERECTOMY Right 11/05/2017   Procedure: ENDARTERECTOMY CAROTID RIGHT;  Surgeon: [name], MD;  Location[facility] OR;  Service: Vascular;  Laterality: Right;  . ESOPHAGOGASTRODUODENOSCOPY N/A 11/26/2012   Procedure: ESOPHAGOGASTRODUODENOSCOPY (EGD);  Surgeon: Shann Medal, MD;  Location: [facility];  Service: General;  Laterality: N/A;  . GANGLION CYST EXCISION Right 02/2008   foot  . KNEE ARTHROSCOPY WITH LATERAL MENISECTOMY Left 07/21/2014   Procedure: LEFT KNEE ARTHROSCOPY,  CHONDROPLASTY, WITH LATERAL AND MEDIAL  MENISCECTOMIES;  Surgeon: Kathryne Hitch, MD;  Location: [place];  Service: Orthopedics;  Laterality: Left;  . KNEE ARTHROSCOPY WITH MEDIAL MENISECTOMY Left 07/21/2014   Procedure: KNEE ARTHROSCOPY WITH MEDIAL MENISECTOMY;  Surgeon: Kathryne Hitch, MD;  Location: [place];  Service: Orthopedics;  Laterality: Left;  . LAPAROSCOPIC GASTRIC BANDING  02/18/2008  . LAPAROSCOPIC REPAIR AND REMOVAL OF GASTRIC BAND N/A 03/21/2017   Procedure: LAPAROSCOPIC REPAIR AND REMOVAL OF GASTRIC BAND;  Surgeon: [name], MD;  Location: [facility];  Service: General;  Laterality: N/A;  . PARTIAL HYSTERECTOMY  09/07/2002  . PATCH ANGIOPLASTY Right 11/05/2017   Procedure: PATCH ANGIOPLASTY of right carotid artery using xenosure bovine pericardium patch;  Surgeon: [name], MD;  Location: [place] OR;  Service: Vascular;  Laterality: Right;  . SHOULDER ARTHROSCOPY WITH SUBACROMIAL DECOMPRESSION, ROTATOR CUFF REPAIR AND BICEP TENDON REPAIR Right 11/23/2015   Procedure: RIGHT SHOULDER ARTHROSCOPY WITH DEBRIDEMENT, SUBACROMIAL DECOMPRESSION, DISTAL CLAVICLE EXCISION, ROTATOR CUFF REPAIR AND BICEP TENODESIS;  Surgeon: Ninetta Lights, MD;  Location: [place];  Service: Orthopedics;  Laterality: Right;    Social History:  Social History   Socioeconomic History  . Marital status: Divorced    Spouse name: Not on file  . Number of children: Not on  file  . Years of education: Not on file  . Highest education level: Not on file  Occupational History  . Not on file  Social Needs  . Financial resource strain: Not on file  . Food insecurity    Worry: Not on file    Inability: Not on file  . Transportation needs    Medical: Not on file    Non-medical: Not on file  Tobacco Use  . Smoking status: Former Smoker    Packs/day: 1.00    Years: 35.00    Pack years: 35.00    Types: Cigarettes    Quit date: 10/27/2017    Years since quitting: 0.6  . Smokeless tobacco: Never Used  . Tobacco comment: in process of quiting only 1 cigeratte today   Substance and Sexual Activity  . Alcohol use: Yes    Comment: occasionally  . Drug use: No  . Sexual activity: Not on file  Lifestyle  . Physical activity    Days per week: Not on file    Minutes per session: Not on file  . Stress: Not on file  Relationships  . Social Herbalist on phone: Not on file    Gets together: Not on file    Attends religious service: Not on file    Active member of club or organization: Not on file    Attends meetings of clubs or organizations: Not on file    Relationship status: Not on file  . Intimate partner violence    Fear of current or ex partner: Not on file    Emotionally abused: Not on file    Physically abused: Not on file    Forced sexual activity: Not on file  Other Topics Concern  . Not on file  Social History Narrative  . Not on file    Family History:  Family History  Problem Relation Age of Onset  . CVA Other   . Diabetes Other   . CAD Other   . CVA Mother   . Cancer Neg Hx     Medications:   Current Outpatient Medications on File Prior to Visit  Medication Sig Dispense Refill  . aspirin 325 MG tablet Take 1 tablet (325 mg total) by mouth daily. 30 tablet 0  . atorvastatin (LIPITOR) 20 MG tablet Take 1 tablet (20 mg total) by mouth daily at 6 PM. 30 tablet 1  . diltiazem (CARDIZEM CD) 180 MG 24 hr capsule Take 1 capsule by mouth daily.    Marland Kitchen diltiazem (DILACOR XR) 180 MG 24 hr capsule Take 180 mg by mouth at bedtime.    . ferrous sulfate 325 (65 FE) MG tablet Take 325 mg by mouth daily with breakfast.    . Ibuprofen (MOTRIN PO) Take 200 mg by mouth as needed.    Marland Kitchen levothyroxine (SYNTHROID, LEVOTHROID) 125 MCG tablet Take 125 mcg by mouth daily before breakfast.    . omeprazole (PRILOSEC) 40 MG capsule Take 1 capsule (40 mg total) by mouth daily. 30 capsule 0  . senna-docusate (SENOKOT-S) 8.6-50 MG tablet Take 1 tablet by mouth at bedtime as needed for mild constipation. (Patient taking differently: Take 2 tablets by  mouth daily. ) 30 tablet 0  . VITAMIN D PO Take 2,000 units of lipase by mouth.     No current facility-administered medications on file prior to visit.     Allergies:   Allergies  Allergen Reactions  . Bee Venom Shortness Of Breath and Swelling  Physical Exam  General: well developed, well nourished, pleasant middle-aged Caucasian female, seated, in no evident distress Head: head normocephalic and atraumatic.    Neurologic Exam Mental Status: Awake and fully alert.  Mild dysarthria.  Oriented to place and time. Recent and remote memory intact. Attention span, concentration and fund of knowledge appropriate. Mood and affect appropriate.  Cranial Nerves: Extraocular movements full without nystagmus. Hearing intact to voice. Facial sensation intact. Face, tongue, palate moves normally and symmetrically.  Shoulder shrug symmetric. Motor: No evidence of weakness per drift assessment Sensory.: intact to light touch Coordination: Rapid alternating movements normal in all extremities. Finger-to-nose and heel-to-shin performed accurately bilaterally. Gait and Station: Arises from chair without difficulty. Stance is normal. Gait demonstrates normal stride length and balance .  Reflexes: UTA       Diagnostic Data (Labs, Imaging, Testing)  CT HEAD WO CONTRAST 10/27/2017 IMPRESSION: Small RIGHT parietal RIGHT parietal subtle hypodensity may be extra-axial. Given adjacent dense cortical vein, venous infarct is possible versus small mass. Recommend MRI of the head with and without contrast and contrast enhanced MRV.  CT ANGIO HEAD W OR WO CONTRAST CT ANGIO NECK W OR WO CONTRAST 10/28/2017 IMPRESSION: 1. Occlusion of the midportion of the left PCA P2 segment and severe stenosis of the proximal right PCA P2 segment. 2. No other occlusion or hemodynamically significant stenosis of the intracranial arteries. 3. 50% stenosis of the proximal right ICA. 20% stenosis of the proximal  left ICA.  MR BRAIN W WO CONTRAST MR MRV HEAD WO CONTRAST 10/27/2017 IMPRESSION: 1. Several small scattered foci of acute and subacute infarction (mixed age) are present in the right parietal lobe and the right occipitotemporal junction. No acute hemorrhage. No significant mass effect. 2. Normal MRV.  No evidence of dural venous sinus thrombosis.  ECHOCARDIOGRAM 10/28/2017 Study Conclusions - Left ventricle: The cavity size was normal. There was mild   concentric hypertrophy. Systolic function was normal. The   estimated ejection fraction was in the range of 60% to 65%. Wall   motion was normal; there were no regional wall motion   abnormalities. Doppler parameters are consistent with abnormal   left ventricular relaxation (grade 1 diastolic dysfunction). - Left atrium: The atrium was mildly dilated.    ASSESSMENT: Angela Abbott is a 58 y.o. year old female here with small right parietal and occipital infarcts on 10/27/2017 secondary to right ICA stenosis.  Underwent right CEA on 11/05/2017.  Vascular risk factors include HTN, HLD, tobacco use and EtOH use.  She is being seen today for stroke follow-up and overall has recovered well with only a residual mild dysarthria.    PLAN:  1. Right parietal and occipital infarcts: Continue aspirin 325 mg daily  and atorvastatin for secondary stroke prevention. Maintain strict control of hypertension with blood pressure goal below 130/90, diabetes with hemoglobin A1c goal below 6.5% and cholesterol with LDL cholesterol (bad cholesterol) goal below 70 mg/dL.  I also advised the patient to eat a healthy diet with plenty of whole grains, cereals, fruits and vegetables, exercise regularly with at least 30 minutes of continuous activity daily and maintain ideal body weight. 2. Post right CEA procedure: Advised to call Dr. Stephens Shire office to schedule recommended at six-month follow-up visit with ultrasound along with discussing ongoing numbness  around procedure site 3. HTN: Advised to continue current treatment regimen. Advised to monitor at home intermittently along with continued follow-up with PCP for management 4. HLD: Advised to continue current treatment regimen along with  continued follow-up with PCP for future prescribing and monitoring of lipid panel   Stable from stroke standpoint and recommend follow-up as needed   Greater than 50% of time during this 15 minute non-face-to-face visit was spent on counseling, explanation of diagnosis of right parietal and occipital infarcts, reviewing risk factor management of HTN, HLD, tobacco and EtOH use, planning of further management along with potential future management, and discussion with patient and family answering all questions.    Venancio Poisson, AGNP-BC  [facility] [address] [place], [place] [address]  Phone [phone] Fax [phone] Note: This document was prepared with digital dictation and possible smart phrase technology. Any transcriptional errors that result from this process are unintentional.

## 2018-06-25 NOTE — Progress Notes (Signed)
I agree with the above plan 

## 2018-07-09 ENCOUNTER — Ambulatory Visit (INDEPENDENT_AMBULATORY_CARE_PROVIDER_SITE_OTHER): Payer: 59

## 2018-07-09 ENCOUNTER — Other Ambulatory Visit: Payer: Self-pay | Admitting: Podiatry

## 2018-07-09 ENCOUNTER — Other Ambulatory Visit: Payer: Self-pay

## 2018-07-09 ENCOUNTER — Ambulatory Visit: Payer: 59 | Admitting: Podiatry

## 2018-07-09 ENCOUNTER — Encounter: Payer: Self-pay | Admitting: Podiatry

## 2018-07-09 VITALS — Temp 97.6°F

## 2018-07-09 DIAGNOSIS — M79671 Pain in right foot: Secondary | ICD-10-CM

## 2018-07-09 DIAGNOSIS — M7752 Other enthesopathy of left foot: Secondary | ICD-10-CM | POA: Diagnosis not present

## 2018-07-09 DIAGNOSIS — M779 Enthesopathy, unspecified: Secondary | ICD-10-CM | POA: Diagnosis not present

## 2018-07-09 DIAGNOSIS — M79672 Pain in left foot: Secondary | ICD-10-CM | POA: Diagnosis not present

## 2018-07-09 DIAGNOSIS — M7751 Other enthesopathy of right foot: Secondary | ICD-10-CM

## 2018-07-13 NOTE — Progress Notes (Signed)
Subjective:   Patient ID: Angela Abbott, female   DOB: 58 y.o.   MRN: [id number]   HPI Patient presents stating the second and third metatarsal phalangeal joints of both feet have become inflamed and occurring increasingly hard for her to be active with.  States that she got good relief from the last time that we utilize medication   ROS      Objective:  Physical Exam  Inflammatory capsulitis second and third MPJs bilateral with fluid buildup around the joint surfaces     Assessment:  Pain to palpation to these areas with fluid buildup in the second and third MPJs of both feet     Plan:  H&P reviewed condition and get proximal nerve block bilateral.  Individually I aspirated the second and third MPJs of both feet I went ahead and I drained and get up got out clear fluid from each joint surface and injected with quarter cc dexamethasone Kenalog individually into each joint second and third metatarsal phalangeal joint.  Patient will be seen back to recheck  X-rays indicate that there is no signs currently of fracture or arthritis within the joint surfaces

## 2018-08-17 ENCOUNTER — Ambulatory Visit: Payer: 59 | Admitting: Surgery

## 2018-08-17 ENCOUNTER — Encounter (HOSPITAL_COMMUNITY): Payer: 59

## 2018-08-19 DIAGNOSIS — I10 Essential (primary) hypertension: Secondary | ICD-10-CM | POA: Insufficient documentation

## 2018-08-19 DIAGNOSIS — E785 Hyperlipidemia, unspecified: Secondary | ICD-10-CM | POA: Insufficient documentation

## 2018-08-19 DIAGNOSIS — I152 Hypertension secondary to endocrine disorders: Secondary | ICD-10-CM | POA: Insufficient documentation

## 2018-08-19 DIAGNOSIS — E7800 Pure hypercholesterolemia, unspecified: Secondary | ICD-10-CM | POA: Insufficient documentation

## 2018-09-03 ENCOUNTER — Other Ambulatory Visit: Payer: Self-pay

## 2018-09-03 DIAGNOSIS — I6521 Occlusion and stenosis of right carotid artery: Secondary | ICD-10-CM

## 2018-09-07 ENCOUNTER — Other Ambulatory Visit: Payer: Self-pay

## 2018-09-07 ENCOUNTER — Ambulatory Visit (HOSPITAL_COMMUNITY)
Admission: RE | Admit: 2018-09-07 | Discharge: 2018-09-07 | Disposition: A | Discharge status: Home / Self Care | Payer: 59 | Source: Ambulatory Visit | Attending: Surgery | Admitting: Surgery

## 2018-09-07 ENCOUNTER — Encounter: Payer: Self-pay | Admitting: Surgery

## 2018-09-07 ENCOUNTER — Ambulatory Visit: Payer: 59 | Admitting: Surgery

## 2018-09-07 VITALS — BP 148/89 | HR 70 | Resp 20 | Ht 66.0 in | Wt 240.0 lb

## 2018-09-07 DIAGNOSIS — I6521 Occlusion and stenosis of right carotid artery: Secondary | ICD-10-CM | POA: Insufficient documentation

## 2018-09-07 DIAGNOSIS — I6523 Occlusion and stenosis of bilateral carotid arteries: Secondary | ICD-10-CM

## 2018-09-07 NOTE — Progress Notes (Signed)
[facility]  Patient name: Angela Abbott MRN: [id number] DOB: November 13, 1960 Sex: female   REASON FOR VISIT:    Follow up carotid  HISOTRY OF PRESENT ILLNESS:   Angela Abbott is a 58 y.o. female who is status post right carotid endarterectomy on 11/05/2017.  This was done for symptomatic right carotid stenosis.  Her symptoms were left arm numbness and facial numbness.  A preoperative MRI revealed multiple embolic infarcts in the right brain.  Intraoperative findings included a 75% stenosis and a  low hypoglossal nerve which had to be extensively mobilized in order to perform the operation.  She had tongue deviation postoperatively and went to speech therapy.  This has now resolved.  She has not started back cmoking     PAST MEDICAL HISTORY:   Past Medical History:  Diagnosis Date  . Arthritis    shoulders, knees, hips  . Carotid artery occlusion   . Dental crowns present   . GERD (gastroesophageal reflux disease)   . Hypertension   . Hypothyroidism   . Left knee pain 07/2014  . Pneumonia    hx of 02/2017   . Right knee pain   . Stress incontinence   . Stroke [facility])    speech difficulty recalling words  . Wears partial dentures    upper and lower     FAMILY HISTORY:   Family History  Problem Relation Age of Onset  . CVA Other   . Diabetes Other   . CAD Other   . CVA Mother   . Cancer Neg Hx     SOCIAL HISTORY:   Social History   Tobacco Use  . Smoking status: Former Smoker    Packs/day: 1.00    Years: 35.00    Pack years: 35.00    Types: Cigarettes    Quit date: 10/27/2017    Years since quitting: 0.8  . Smokeless tobacco: Never Used  . Tobacco comment: in process of quiting only 1 cigeratte today  Substance Use Topics  . Alcohol use: Yes    Comment: occasionally     ALLERGIES:   Allergies  Allergen Reactions  . Bee Venom Shortness Of Breath and Swelling     CURRENT  MEDICATIONS:   Current Outpatient Medications  Medication Sig Dispense Refill  . aspirin 325 MG tablet Take 1 tablet (325 mg total) by mouth daily. 30 tablet 0  . atorvastatin (LIPITOR) 20 MG tablet Take 1 tablet (20 mg total) by mouth daily at 6 PM. 30 tablet 1  . diltiazem (CARDIZEM CD) 180 MG 24 hr capsule Take 1 capsule by mouth daily.    Marland Kitchen diltiazem (DILACOR XR) 180 MG 24 hr capsule Take 180 mg by mouth at bedtime.    . ferrous sulfate 325 (65 FE) MG tablet Take 325 mg by mouth daily with breakfast.    . Ibuprofen (MOTRIN PO) Take 200 mg by mouth as needed.    Marland Kitchen levothyroxine (SYNTHROID, LEVOTHROID) 125 MCG tablet Take 125 mcg by mouth daily before breakfast.    . omeprazole (PRILOSEC) 40 MG capsule Take 1 capsule (40 mg total) by mouth daily. 30 capsule 0  . senna-docusate (SENOKOT-S) 8.6-50 MG tablet Take 1 tablet by mouth at bedtime as needed for mild constipation. (Patient taking differently: Take 2 tablets by mouth daily. ) 30 tablet 0  . tolterodine (DETROL LA) 2 MG 24 hr capsule Take 2 mg by mouth daily.    Marland Kitchen VITAMIN D PO Take  2,000 units of lipase by mouth.     No current facility-administered medications for this visit.     REVIEW OF SYSTEMS:   [X]  denotes positive finding, [ ]  denotes negative finding Cardiac  Comments:  Chest pain or chest pressure:    Shortness of breath upon exertion:    Short of breath when lying flat:    Irregular heart rhythm:        Vascular    Pain in calf, thigh, or hip brought on by ambulation:    Pain in feet at night that wakes you up from your sleep:     Blood clot in your veins:    Leg swelling:         Pulmonary    Oxygen at home:    Productive cough:     Wheezing:         Neurologic    Sudden weakness in arms or legs:     Sudden numbness in arms or legs:     Sudden onset of difficulty speaking or slurred speech:    Temporary loss of vision in one eye:     Problems with dizziness:         Gastrointestinal    Blood in stool:      Vomited blood:         Genitourinary    Burning when urinating:     Blood in urine:        Psychiatric    Major depression:         Hematologic    Bleeding problems:    Problems with blood clotting too easily:        Skin    Rashes or ulcers:        Constitutional    Fever or chills:      PHYSICAL EXAM:   Vitals:   09/07/18 1342 09/07/18 1345  BP: 137/81 (!) 148/89  Pulse: 70   Resp: 20   SpO2: 97%   Weight: 240 lb (108.9 kg)   Height: 5\' 6"  (1.676 m)     GENERAL: The patient is a well-nourished female, in no acute distress. The vital signs are documented above. CARDIAC: There is a regular rate and rhythm.  VASCULAR: incision is well healed PULMONARY: Non-labored respirations  MUSCULOSKELETAL: There are no major deformities or cyanosis. NEUROLOGIC: No focal weakness or paresthesias are detected. SKIN: There are no ulcers or rashes noted. PSYCHIATRIC: The patient has a normal affect.  STUDIES:   I have ordered and reviewed the following carotid duplex:  Right Carotid: The ECA appears <50% stenosed. Patent right carotid                endarterectomy with moderate hyperplasia noted in the proximal                internal carotid artery and velocities suggestive of a 60-79%                stenosis.  Left Carotid: Velocities in the left ICA are consistent with a 40-59% stenosis.               Non-hemodynamically significant plaque <50% noted in the CCA. The               ECA appears <50% stenosed. 2D imaging does not correlate with               elevated end diastolic velocities.   MEDICAL ISSUES:   Carotid stenosis; the patient has  recovered nicely from her carotid endarterectomy.  Her hypoglossal neuropraxia has resolved.  We discussed her ultrasound findings today.  She has developed a early recurrence on the right likely secondary to intimal hyperplasia with a stenosis of 60-79%.  We discussed this, and that we would follow it with serial ultrasound.   If this becomes greater than 80%, we would consider intervention, most likely stenting.  I have her scheduled for follow-up in 6 months.    Leia Alf, MD, FACS [facility] [phone] Pager [phone]

## 2018-12-07 ENCOUNTER — Other Ambulatory Visit: Payer: Self-pay

## 2018-12-07 ENCOUNTER — Ambulatory Visit: Payer: 59 | Admitting: Podiatry

## 2018-12-07 ENCOUNTER — Encounter: Payer: Self-pay | Admitting: Podiatry

## 2018-12-07 DIAGNOSIS — M722 Plantar fascial fibromatosis: Secondary | ICD-10-CM

## 2018-12-07 DIAGNOSIS — M779 Enthesopathy, unspecified: Secondary | ICD-10-CM

## 2018-12-07 NOTE — Progress Notes (Signed)
Subjective:   Patient ID: Angela Abbott, female   DOB: 58 y.o.   MRN: [id number]   HPI Patient states the bottom of the left heel has started to hurt and it is making it hard for her to walk and states it does not feel like it supported well.  States the inflammatory capsules are feeling mostly better   ROS      Objective:  Physical Exam  Neurovascular status intact with exquisite discomfort plantar aspect left heel at the insertional point tendon calcaneus.  Reduce discomfort around the plantar capsule 2 and 3 bilateral     Assessment:  Acute plantar fasciitis left with inflammation fluid buildup.  Improved capsulitis left     Plan:  H&P sterile prep done injected the plantar fascial left 3 mg Kenalog 5 mg Xylocaine applied fascial brace to lift the arch and continue orthotics and also discussed the inflammatory capsulitis that she has and will continue to use rigid bottom shoes

## 2018-12-07 NOTE — Patient Instructions (Signed)

## 2018-12-21 ENCOUNTER — Ambulatory Visit (INDEPENDENT_AMBULATORY_CARE_PROVIDER_SITE_OTHER): Payer: 59 | Admitting: Podiatry

## 2018-12-21 ENCOUNTER — Other Ambulatory Visit: Payer: Self-pay

## 2018-12-21 ENCOUNTER — Encounter: Payer: Self-pay | Admitting: Podiatry

## 2018-12-21 DIAGNOSIS — M722 Plantar fascial fibromatosis: Secondary | ICD-10-CM | POA: Diagnosis not present

## 2018-12-21 NOTE — Progress Notes (Signed)
Subjective:   Patient ID: Angela Abbott, female   DOB: 58 y.o.   MRN: [id number]   HPI Patient states she seems to be improved and states that her inserts have helped her but she will probably need new ones next year due to the amount she walks.  Patient is noted to have significant reduction of plantar discomfort and nails that are healing well   ROS      Objective:  Physical Exam  Doing well overall with inflammatory condition and also fasciitis ingrown toenails that have improved in her heel     Assessment:  Satisfied with your patient is up on evaluation of condition with appearance of capsulitis fasciitis     Plan:  Reviewed condition and recommended continuation of orthotics boot usage on weekends stretching exercises and shoe gear modification.  Reappoint to recheck as needed and may require new orthotics

## 2019-01-21 ENCOUNTER — Ambulatory Visit (INDEPENDENT_AMBULATORY_CARE_PROVIDER_SITE_OTHER): Payer: 59

## 2019-01-21 ENCOUNTER — Encounter: Payer: Self-pay | Admitting: Podiatry

## 2019-01-21 ENCOUNTER — Ambulatory Visit: Payer: 59 | Admitting: Podiatry

## 2019-01-21 ENCOUNTER — Other Ambulatory Visit: Payer: Self-pay

## 2019-01-21 DIAGNOSIS — M722 Plantar fascial fibromatosis: Secondary | ICD-10-CM | POA: Diagnosis not present

## 2019-01-21 DIAGNOSIS — M779 Enthesopathy, unspecified: Secondary | ICD-10-CM

## 2019-01-21 DIAGNOSIS — M79671 Pain in right foot: Secondary | ICD-10-CM

## 2019-01-21 MED ORDER — PREDNISONE 10 MG PO TABS: ORAL_TABLET | ORAL | 0 refills | Status: DC

## 2019-01-22 ENCOUNTER — Other Ambulatory Visit: Payer: Self-pay | Admitting: Podiatry

## 2019-01-22 DIAGNOSIS — M722 Plantar fascial fibromatosis: Secondary | ICD-10-CM

## 2019-01-22 DIAGNOSIS — M779 Enthesopathy, unspecified: Secondary | ICD-10-CM

## 2019-01-22 NOTE — Progress Notes (Signed)
Subjective:   Patient ID: Angela Abbott, female   DOB: 59 y.o.   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  The right has also become very inflamed and sore and between the 2 she is having trouble walking   ROS      Objective:  Physical Exam  Neurovascular status was found to be intact patient is found to have severe pain plantar lateral aspect left fascia at the insertion calcaneus with fluid buildup and on the right I noted there to be quite a bit of discomfort in the third metatarsal phalangeal joint with fluid buildup around the joint surface and pain with palpation.  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  For the left I did sterile prep and I then injected the lateral and central fascial band 3 mg Kenalog 5 mg Xylocaine I applied air fracture walker left to take all pressure off the foot and for the right I did forefoot block and aspirated the joint getting out a clear fluid and injected quarter cc dexamethasone Kenalog and advised on rigid bottom shoes.  I placed on steroids 12-day pack while she is out of work and we will reevaluate in 2 weeks  X-rays were negative for signs of fracture did indicate significant plantar spur formation but no indications of arthritis or acute condition

## 2019-01-31 ENCOUNTER — Emergency Department (HOSPITAL_COMMUNITY)
Admission: EM | Admit: 2019-01-31 | Discharge: 2019-02-01 | Disposition: A | Discharge status: Home / Self Care | Payer: 59 | Attending: Emergency Medicine | Admitting: Emergency Medicine

## 2019-01-31 ENCOUNTER — Emergency Department (HOSPITAL_COMMUNITY): Payer: 59

## 2019-01-31 ENCOUNTER — Other Ambulatory Visit: Payer: Self-pay

## 2019-01-31 ENCOUNTER — Encounter (HOSPITAL_COMMUNITY): Payer: Self-pay

## 2019-01-31 DIAGNOSIS — Z79899 Other long term (current) drug therapy: Secondary | ICD-10-CM | POA: Diagnosis not present

## 2019-01-31 DIAGNOSIS — Z8673 Personal history of transient ischemic attack (TIA), and cerebral infarction without residual deficits: Secondary | ICD-10-CM | POA: Insufficient documentation

## 2019-01-31 DIAGNOSIS — Z7982 Long term (current) use of aspirin: Secondary | ICD-10-CM | POA: Diagnosis not present

## 2019-01-31 DIAGNOSIS — E039 Hypothyroidism, unspecified: Secondary | ICD-10-CM | POA: Insufficient documentation

## 2019-01-31 DIAGNOSIS — Z87891 Personal history of nicotine dependence: Secondary | ICD-10-CM | POA: Insufficient documentation

## 2019-01-31 DIAGNOSIS — R200 Anesthesia of skin: Secondary | ICD-10-CM | POA: Diagnosis present

## 2019-01-31 DIAGNOSIS — R202 Paresthesia of skin: Secondary | ICD-10-CM | POA: Insufficient documentation

## 2019-01-31 DIAGNOSIS — I10 Essential (primary) hypertension: Secondary | ICD-10-CM | POA: Insufficient documentation

## 2019-01-31 LAB — COMPREHENSIVE METABOLIC PANEL
ALT: 21 U/L (ref 0–44)
AST: 18 U/L (ref 15–41)
Albumin: 3.1 g/dL — ABNORMAL LOW (ref 3.5–5.0)
Alkaline Phosphatase: 89 U/L (ref 38–126)
Anion gap: 10 (ref 5–15)
BUN: 15 mg/dL (ref 6–20)
CO2: 26 mmol/L (ref 22–32)
Calcium: 8.7 mg/dL — ABNORMAL LOW (ref 8.9–10.3)
Chloride: 102 mmol/L (ref 98–111)
Creatinine, Ser: 1.06 mg/dL — ABNORMAL HIGH (ref 0.44–1.00)
GFR calc Af Amer: >60 mL/min (ref >60)
GFR calc non Af Amer: 57 mL/min — ABNORMAL LOW (ref >60)
Glucose, Bld: 139 mg/dL — ABNORMAL HIGH (ref 70–99)
Potassium: 4.3 mmol/L (ref 3.5–5.1)
Sodium: 138 mmol/L (ref 135–145)
Total Bilirubin: 0.7 mg/dL (ref 0.3–1.2)
Total Protein: 6.3 g/dL — ABNORMAL LOW (ref 6.5–8.1)

## 2019-01-31 LAB — I-STAT BETA HCG BLOOD, ED (MC, WL, AP ONLY): I-stat hCG, quantitative: <5 m[IU]/mL (ref <5)

## 2019-01-31 LAB — CBC
HCT: 44.8 % (ref 36.0–46.0)
Hemoglobin: 14.6 g/dL (ref 12.0–15.0)
MCH: 29.7 pg (ref 26.0–34.0)
MCHC: 32.6 g/dL (ref 30.0–36.0)
MCV: 91.1 fL (ref 80.0–100.0)
Platelets: 230 10*3/uL (ref 150–400)
RBC: 4.92 MIL/uL (ref 3.87–5.11)
RDW: 13.9 % (ref 11.5–15.5)
WBC: 11.2 10*3/uL — ABNORMAL HIGH (ref 4.0–10.5)
nRBC: 0 % (ref 0.0–0.2)

## 2019-01-31 LAB — DIFFERENTIAL
Abs Immature Granulocytes: 0.06 10*3/uL (ref 0.00–0.07)
Basophils Absolute: 0.1 10*3/uL (ref 0.0–0.1)
Basophils Relative: 0 %
Eosinophils Absolute: 0.1 10*3/uL (ref 0.0–0.5)
Eosinophils Relative: 1 %
Immature Granulocytes: 1 %
Lymphocytes Relative: 26 %
Lymphs Abs: 2.9 10*3/uL (ref 0.7–4.0)
Monocytes Absolute: 1.3 10*3/uL — ABNORMAL HIGH (ref 0.1–1.0)
Monocytes Relative: 11 %
Neutro Abs: 6.8 10*3/uL (ref 1.7–7.7)
Neutrophils Relative %: 61 %

## 2019-01-31 LAB — APTT: aPTT: 27 seconds (ref 24–36)

## 2019-01-31 LAB — I-STAT CHEM 8, ED
BUN: 19 mg/dL (ref 6–20)
Calcium, Ion: 1.16 mmol/L (ref 1.15–1.40)
Chloride: 101 mmol/L (ref 98–111)
Creatinine, Ser: 1 mg/dL (ref 0.44–1.00)
Glucose, Bld: 132 mg/dL — ABNORMAL HIGH (ref 70–99)
HCT: 44 % (ref 36.0–46.0)
Hemoglobin: 15 g/dL (ref 12.0–15.0)
Potassium: 4.3 mmol/L (ref 3.5–5.1)
Sodium: 138 mmol/L (ref 135–145)
TCO2: 32 mmol/L (ref 22–32)

## 2019-01-31 LAB — PROTIME-INR
INR: 0.9 (ref 0.8–1.2)
Prothrombin Time: 12.3 seconds (ref 11.4–15.2)

## 2019-01-31 MED ORDER — IOHEXOL 350 MG/ML SOLN
75.0000 mL | Freq: Once | INTRAVENOUS | Status: AC | PRN
  Administered 2019-01-31: 75 mL via INTRAVENOUS

## 2019-01-31 MED ORDER — SODIUM CHLORIDE 0.9% FLUSH
3.0000 mL | Freq: Once | INTRAVENOUS | Status: AC
  Administered 2019-01-31: 3 mL via INTRAVENOUS

## 2019-01-31 NOTE — ED Triage Notes (Signed)
Pt arrives POV for eval of eisode of face numbness onset at 2130 lasting about 3 minutes and then spontaneously resolved. Pt reports hx of CVA w/ R sided residual numbness sice 2019. Pt reports she has been having her "normal episodes of R sided numbness" all day today, but the episode of face  Numbness scared her. Pt reports she is at her baseline in triage, GCS 15, no neuro deficits. No facial droop, no drift.

## 2019-01-31 NOTE — ED Provider Notes (Signed)
[facility] EMERGENCY DEPARTMENT Provider Note   CSN: [id number] Arrival date & time: 01/31/19  2201     History Chief Complaint  Patient presents with  . Numbness    Angela Abbott is a 59 y.o. female with PMHx HTN, hypothyroidism, GERD, previous stroke who presents to the ED today with complaint of sudden onset, constant, resolved, right sided face tingling that occurred at 9:30 PM tonight. Pt reports this lasted 3-5 minutes before dissipating. She reports she sat on her couch and cried because she believed she was having another stroke. Pt states she was home alone and she did not try to look in the mirror to see if she was having any facial drooping and she did not try to speak to see if she was having any speech difficulties. She has no complaints currently. Pt states that since her stroke in 2019 she has been having intermittent right sided tingling to her upper and lower extremities. It does appear when she first presented for her stroke in 2019 she was having left sided numbness and tingling but she reports she has been having right sided numbness since. Pt did have an CEA in 2019 by Dr. Arita Miss as well.   CTA Head 10/28/2017: IMPRESSION: 1. Occlusion of the midportion of the left PCA P2 segment and severe stenosis of the proximal right PCA P2 segment. 2. No other occlusion or hemodynamically significant stenosis of the intracranial arteries. 3. 50% stenosis of the proximal right ICA. 20% stenosis of the proximal left ICA.   The history is provided by the patient and medical records.       Past Medical History:  Diagnosis Date  . Arthritis    shoulders, knees, hips  . Carotid artery occlusion   . Dental crowns present   . GERD (gastroesophageal reflux disease)   . Hypertension   . Hypothyroidism   . Left knee pain 07/2014  . Pneumonia    hx of 02/2017   . Right knee pain   . Stress incontinence   . Stroke [facility])    speech difficulty recalling  words  . Wears partial dentures    upper and lower    Patient Active Problem List   Diagnosis Date Noted  . Hypercholesterolemia 08/19/2018  . Hypertensive disorder 08/19/2018  . Numbness 11/01/2017  . TIA (transient ischemic attack) 11/01/2017  . Sensory disturbance 11/01/2017  . Acute ischemic stroke ([place]) 11/01/2017  . CVA (cerebral vascular accident) ([place]) 10/27/2017  . Tobacco abuse 10/27/2017  . Trigger middle finger of right hand 06/19/2017  . Chest pain 02/14/2017  . Multifocal pneumonia 02/14/2017  . Hypothyroidism 02/14/2017  . GERD (gastroesophageal reflux disease) 02/14/2017  . Pneumonia 02/14/2017  . Cigarette smoker 11/13/2016  . Chronic cough 11/12/2016  . Mallet deformity of left ring finger 08/22/2015  . Sciatica of left side 05/18/2013  . Arthritis of wrist, left, degenerative 03/30/2013  . History of laparoscopic adjustable gastric banding, 02/17/2008 04/29/2012  . Morbid obesity ([place]) 01/16/2012  . PLANTAR FACIITIS 07/01/2007  . KNEE PAIN 04/29/2007  . TEAR MEDIAL MENISCUS 04/29/2007    Past Surgical History:  Procedure Laterality Date  . ABDOMINAL HYSTERECTOMY     partial  . CAROTID ENDARTERECTOMY    . [place]  . ENDARTERECTOMY Right 11/05/2017   Procedure: ENDARTERECTOMY CAROTID RIGHT;  Surgeon: [name], MD;  Location: [facility] OR;  Service: Vascular;  Laterality: Right;  . ESOPHAGOGASTRODUODENOSCOPY N/A 11/26/2012   Procedure: ESOPHAGOGASTRODUODENOSCOPY (EGD);  Surgeon: Shann Medal, MD;  Location: [facility];  Service: General;  Laterality: N/A;  . GANGLION CYST EXCISION Right 02/2008   foot  . KNEE ARTHROSCOPY WITH LATERAL MENISECTOMY Left 07/21/2014   Procedure: LEFT KNEE ARTHROSCOPY,  CHONDROPLASTY, WITH LATERAL AND MEDIAL  MENISCECTOMIES;  Surgeon: Kathryne Hitch, MD;  Location: [place];  Service: Orthopedics;  Laterality: Left;  . KNEE ARTHROSCOPY WITH MEDIAL MENISECTOMY Left 07/21/2014   Procedure:  KNEE ARTHROSCOPY WITH MEDIAL MENISECTOMY;  Surgeon: Kathryne Hitch, MD;  Location: [place];  Service: Orthopedics;  Laterality: Left;  . LAPAROSCOPIC GASTRIC BANDING  02/18/2008  . LAPAROSCOPIC REPAIR AND REMOVAL OF GASTRIC BAND N/A 03/21/2017   Procedure: LAPAROSCOPIC REPAIR AND REMOVAL OF GASTRIC BAND;  Surgeon: [name], MD;  Location: [facility];  Service: General;  Laterality: N/A;  . PARTIAL HYSTERECTOMY  09/07/2002  . PATCH ANGIOPLASTY Right 11/05/2017   Procedure: PATCH ANGIOPLASTY of right carotid artery using xenosure bovine pericardium patch;  Surgeon: [name], MD;  Location: [place] OR;  Service: Vascular;  Laterality: Right;  . SHOULDER ARTHROSCOPY WITH SUBACROMIAL DECOMPRESSION, ROTATOR CUFF REPAIR AND BICEP TENDON REPAIR Right 11/23/2015   Procedure: RIGHT SHOULDER ARTHROSCOPY WITH DEBRIDEMENT, SUBACROMIAL DECOMPRESSION, DISTAL CLAVICLE EXCISION, ROTATOR CUFF REPAIR AND BICEP TENODESIS;  Surgeon: Ninetta Lights, MD;  Location: [place];  Service: Orthopedics;  Laterality: Right;     OB History   No obstetric history on file.     Family History  Problem Relation Age of Onset  . CVA Other   . Diabetes Other   . CAD Other   . CVA Mother   . Cancer Neg Hx     Social History   Tobacco Use  . Smoking status: Former Smoker    Packs/day: 1.00    Years: 35.00    Pack years: 35.00    Types: Cigarettes    Quit date: 10/27/2017    Years since quitting: 1.2  . Smokeless tobacco: Never Used  . Tobacco comment: in process of quiting only 1 cigeratte today  Substance Use Topics  . Alcohol use: Yes    Comment: occasionally  . Drug use: No    Home Medications Prior to Admission medications   Medication Sig Start Date End Date Taking? Authorizing Provider  aspirin 325 MG tablet Take 1 tablet (325 mg total) by mouth daily. 10/29/17   Hosie Poisson, MD  atorvastatin (LIPITOR) 10 MG tablet Take 10 mg by mouth daily. 11/16/18   [provider]  cephALEXin (KEFLEX) 500 MG capsule Take 500 mg by mouth 4 (four) times daily. 12/15/18   [provider]  diltiazem (CARDIZEM CD) 180 MG 24 hr capsule Take 1 capsule by mouth daily. 10/31/17   [provider]  diltiazem (DILACOR XR) 180 MG 24 hr capsule Take 180 mg by mouth at bedtime.    [provider]  ferrous sulfate 325 (65 FE) MG tablet Take 325 mg by mouth daily with breakfast.    [provider]  Ibuprofen (MOTRIN PO) Take 200 mg by mouth as needed.    [provider]  levothyroxine (SYNTHROID, LEVOTHROID) 125 MCG tablet Take 125 mcg by mouth daily before breakfast.    [provider]  omeprazole (PRILOSEC) 40 MG capsule Take 1 capsule (40 mg total) by mouth daily. 02/15/17   Kathie Dike, MD  predniSONE (DELTASONE) 10 MG tablet 12 day tapering dose 01/21/19   Wallene Huh, DPM  senna-docusate (SENOKOT-S) 8.6-50  MG tablet Take 1 tablet by mouth at bedtime as needed for mild constipation. Patient taking differently: Take 2 tablets by mouth daily.  10/29/17   Hosie Poisson, MD  tolterodine (DETROL LA) 2 MG 24 hr capsule Take 2 mg by mouth daily.    [provider]  valACYclovir (VALTREX) 1000 MG tablet Take 1,000 mg by mouth 3 (three) times daily. 11/06/18   [provider]  VITAMIN D PO Take 2,000 units of lipase by mouth.    [provider]    Allergies    Bee venom  Review of Systems   Review of Systems  Constitutional: Negative for chills and fever.  Eyes: Negative for visual disturbance.  Neurological: Negative for weakness, numbness and headaches.       + right face tingling  Psychiatric/Behavioral: Negative for confusion.  All other systems reviewed and are negative.   Physical Exam Updated Vital Signs BP (!) 142/86 (BP Location: Right Arm)   Pulse 74   Temp 98.6 F (37 C) (Oral)   Resp 19   Ht 5\' 6"  (1.676 m)   Wt 113.4 kg   SpO2 95%   BMI 40.35 kg/m   Physical  Exam Vitals and nursing note reviewed.  Constitutional:      Appearance: She is not ill-appearing.  HENT:     Head: Normocephalic and atraumatic.  Eyes:     Conjunctiva/sclera: Conjunctivae normal.  Cardiovascular:     Rate and Rhythm: Normal rate and regular rhythm.     Pulses: Normal pulses.  Pulmonary:     Effort: Pulmonary effort is normal.     Breath sounds: Normal breath sounds. No wheezing, rhonchi or rales.  Abdominal:     Palpations: Abdomen is soft.     Tenderness: There is no abdominal tenderness.  Musculoskeletal:     Cervical back: Neck supple.  Skin:    General: Skin is warm and dry.  Neurological:     Mental Status: She is alert.     Comments: CN 3-12 grossly intact A&O x4 GCS 15 Sensation and strength intact Gait nonataxic including with tandem walking Coordination with finger-to-nose WNL Neg romberg, neg pronator drift     ED Results / Procedures / Treatments   Labs (all labs ordered are listed, but only abnormal results are displayed) Labs Reviewed  CBC - Abnormal; Notable for the following components:      Result Value   WBC 11.2 (*)    All other components within normal limits  DIFFERENTIAL - Abnormal; Notable for the following components:   Monocytes Absolute 1.3 (*)    All other components within normal limits  COMPREHENSIVE METABOLIC PANEL - Abnormal; Notable for the following components:   Glucose, Bld 139 (*)    Creatinine, Ser 1.06 (*)    Calcium 8.7 (*)    Total Protein 6.3 (*)    Albumin 3.1 (*)    GFR calc non Af Amer 57 (*)    All other components within normal limits  I-STAT CHEM 8, ED - Abnormal; Notable for the following components:   Glucose, Bld 132 (*)    All other components within normal limits  PROTIME-INR  APTT  CBG MONITORING, ED  I-STAT BETA HCG BLOOD, ED ([facility], [facility], [facility] ONLY)    EKG EKG Interpretation  Date/Time:  Sunday January 31 2019 22:55:53 EST Ventricular Rate:  67 PR Interval:    QRS Duration: 99 QT  Interval:  377 QTC Calculation: 398 R Axis:   -44  Text Interpretation: Sinus rhythm No STEMI Confirmed by Octaviano Glow [phone]) on 01/31/2019 11:21:59 PM   Radiology CT Angio Head W or Wo Contrast  Result Date: 01/31/2019 CLINICAL DATA:  Facial numbness EXAM: CT ANGIOGRAPHY HEAD AND NECK TECHNIQUE: Multidetector CT imaging of the head and neck was performed using the standard protocol during bolus administration of intravenous contrast. Multiplanar CT image reconstructions and MIPs were obtained to evaluate the vascular anatomy. Carotid stenosis measurements (when applicable) are obtained utilizing NASCET criteria, using the distal internal carotid diameter as the denominator. CONTRAST:  88mL OMNIPAQUE IOHEXOL 350 MG/ML SOLN COMPARISON:  Head CT 01/31/2019 CTA head 10/28/2017 FINDINGS: CTA NECK FINDINGS SKELETON: There is no bony spinal canal stenosis. No lytic or blastic lesion. OTHER NECK: Normal pharynx, larynx and major salivary glands. No cervical lymphadenopathy. Unremarkable thyroid gland. UPPER CHEST: No pneumothorax or pleural effusion. No nodules or masses. AORTIC ARCH: There is no calcific atherosclerosis of the aortic arch. There is no aneurysm, dissection or hemodynamically significant stenosis of the visualized portion of the aorta. Conventional 3 vessel aortic branching pattern. The visualized proximal subclavian arteries are widely patent. RIGHT CAROTID SYSTEM: No dissection, occlusion or aneurysm. There is noncalcified atherosclerosis extending into the proximal ICA, resulting in 30% stenosis. LEFT CAROTID SYSTEM: No dissection, occlusion or aneurysm. There is noncalcified atherosclerosis extending into the proximal ICA, resulting in 30% stenosis. VERTEBRAL ARTERIES: Right dominant configuration. Both origins are clearly patent. There is no dissection, occlusion or flow-limiting stenosis to the skull base (V1-V3 segments). CTA HEAD FINDINGS POSTERIOR CIRCULATION: --Vertebral arteries:  Normal V4 segments. --Posterior inferior cerebellar arteries (PICA): Patent origins from the vertebral arteries. --Anterior inferior cerebellar arteries (AICA): Patent origins from the basilar artery. --Basilar artery: Normal. --Superior cerebellar arteries: Normal. --Posterior cerebral arteries: The right P2 segment is occluded proximally, unchanged from the prior study. There is reconstitution of the distal P2 segment patency of the remainder of the artery. The left PCA is normal. Posterior communicating arteries are diminutive or absent. ANTERIOR CIRCULATION: --Intracranial internal carotid arteries: Normal. --Anterior cerebral arteries (ACA): Normal. Both A1 segments are present. Patent anterior communicating artery (a-comm). --Middle cerebral arteries (MCA): Normal. VENOUS SINUSES: As permitted by contrast timing, patent. ANATOMIC VARIANTS: None Review of the MIP images confirms the above findings. IMPRESSION: 1. No emergent large vessel occlusion. 2. 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 3. 30% stenosis of both proximal internal carotid arteries secondary to noncalcified atherosclerotic plaque. R ICA stenosis has improved following endarterectomy. Electronically Signed   By: Ulyses Jarred M.D.   On: 01/31/2019 23:50   CT HEAD WO CONTRAST  Result Date: 01/31/2019 CLINICAL DATA:  Right side numbness, facial droop. EXAM: CT HEAD WITHOUT CONTRAST TECHNIQUE: Contiguous axial images were obtained from the base of the skull through the vertex without intravenous contrast. COMPARISON:  11/01/2017.  MRI 11/02/2017. FINDINGS: Brain: Small cortical infarcts noted in the posterior right frontal lobe. This is old, noted on MRI from 11/02/2017. No acute infarcts. No hemorrhage or hydrocephalus. Vascular: No hyperdense vessel or unexpected calcification. Skull: No acute calvarial abnormality. Sinuses/Orbits: Visualized paranasal sinuses and mastoids clear. Orbital soft  tissues unremarkable. Other: None IMPRESSION: Small old posterior right frontal cortical infarct. Atrophy, chronic microvascular disease. No acute intracranial abnormality. Electronically Signed   By: Rolm Baptise M.D.   On: 01/31/2019 22:41   CT Angio Neck W and/or Wo Contrast  Result Date: 01/31/2019 CLINICAL DATA:  Facial numbness EXAM: CT ANGIOGRAPHY HEAD AND NECK TECHNIQUE: Multidetector  CT imaging of the head and neck was performed using the standard protocol during bolus administration of intravenous contrast. Multiplanar CT image reconstructions and MIPs were obtained to evaluate the vascular anatomy. Carotid stenosis measurements (when applicable) are obtained utilizing NASCET criteria, using the distal internal carotid diameter as the denominator. CONTRAST:  11mL OMNIPAQUE IOHEXOL 350 MG/ML SOLN COMPARISON:  Head CT 01/31/2019 CTA head 10/28/2017 FINDINGS: CTA NECK FINDINGS SKELETON: There is no bony spinal canal stenosis. No lytic or blastic lesion. OTHER NECK: Normal pharynx, larynx and major salivary glands. No cervical lymphadenopathy. Unremarkable thyroid gland. UPPER CHEST: No pneumothorax or pleural effusion. No nodules or masses. AORTIC ARCH: There is no calcific atherosclerosis of the aortic arch. There is no aneurysm, dissection or hemodynamically significant stenosis of the visualized portion of the aorta. Conventional 3 vessel aortic branching pattern. The visualized proximal subclavian arteries are widely patent. RIGHT CAROTID SYSTEM: No dissection, occlusion or aneurysm. There is noncalcified atherosclerosis extending into the proximal ICA, resulting in 30% stenosis. LEFT CAROTID SYSTEM: No dissection, occlusion or aneurysm. There is noncalcified atherosclerosis extending into the proximal ICA, resulting in 30% stenosis. VERTEBRAL ARTERIES: Right dominant configuration. Both origins are clearly patent. There is no dissection, occlusion or flow-limiting stenosis to the skull base (V1-V3  segments). CTA HEAD FINDINGS POSTERIOR CIRCULATION: --Vertebral arteries: Normal V4 segments. --Posterior inferior cerebellar arteries (PICA): Patent origins from the vertebral arteries. --Anterior inferior cerebellar arteries (AICA): Patent origins from the basilar artery. --Basilar artery: Normal. --Superior cerebellar arteries: Normal. --Posterior cerebral arteries: The right P2 segment is occluded proximally, unchanged from the prior study. There is reconstitution of the distal P2 segment patency of the remainder of the artery. The left PCA is normal. Posterior communicating arteries are diminutive or absent. ANTERIOR CIRCULATION: --Intracranial internal carotid arteries: Normal. --Anterior cerebral arteries (ACA): Normal. Both A1 segments are present. Patent anterior communicating artery (a-comm). --Middle cerebral arteries (MCA): Normal. VENOUS SINUSES: As permitted by contrast timing, patent. ANATOMIC VARIANTS: None Review of the MIP images confirms the above findings. IMPRESSION: 1. No emergent large vessel occlusion. 2. 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 3. 30% stenosis of both proximal internal carotid arteries secondary to noncalcified atherosclerotic plaque. R ICA stenosis has improved following endarterectomy. Electronically Signed   By: Ulyses Jarred M.D.   On: 01/31/2019 23:50    Procedures Procedures (including critical care time)  Medications Ordered in ED Medications  sodium chloride flush (NS) 0.9 % injection 3 mL (3 mLs Intravenous Given 01/31/19 2305)  iohexol (OMNIPAQUE) 350 MG/ML injection 75 mL (75 mLs Intravenous Contrast Given 01/31/19 2323)    ED Course  I have reviewed the triage vital signs and the nursing notes.  Pertinent labs & imaging results that were available during my care of the patient were reviewed by me and considered in my medical decision making (see chart for details).  59 year old female with history of  right-sided stroke in 2019 who presents to the ED today complaining of right-sided facial tingling that lasted 3 to 5 minutes at 9:30 PM tonight, has since resolved.  No other complaints currently.  No focal neuro deficits on exam.  And her symptoms have resolved a code stroke was not initiated.  CT head negative.  Will consult neurology for further recommendations.   Clinical Course as of Jan 30 2354  Nancy Fetter Jan 31, 2019  2303 Discussed case with Dr. Lorraine Lax with neurology; recommends CTA Head and Neck and MRI Brain; will see patient and  decide disposition   [MV]    Clinical Course User Index [MV] Eustaquio Maize, PA-C   11:56 PM At shift change case signed out to Quincy Carnes, PA-C, who will dispo accordingly. Dr. Lorraine Lax will need to be reconsulted after imaging returns.  MDM Rules/Calculators/A&P                       Final Clinical Impression(s) / ED Diagnoses Final diagnoses:  None    Rx / DC Orders ED Discharge Orders    None       Eustaquio Maize, PA-C 01/31/19 2356    Wyvonnia Dusky, MD 02/01/19 [phone]

## 2019-02-01 ENCOUNTER — Ambulatory Visit: Payer: 59 | Admitting: Podiatry

## 2019-02-01 DIAGNOSIS — R202 Paresthesia of skin: Secondary | ICD-10-CM

## 2019-02-01 LAB — CBG MONITORING, ED: Glucose-Capillary: 134 mg/dL — ABNORMAL HIGH (ref 70–99)

## 2019-02-01 MED ORDER — CLOPIDOGREL BISULFATE 75 MG PO TABS: 75.0000 mg | ORAL_TABLET | Freq: Every day | ORAL | 0 refills | Status: DC

## 2019-02-01 NOTE — Consult Note (Signed)
Requesting Physician: Dr. Langston Masker    Chief Complaint: Right facial numbness  History obtained from: Patient and Chart    HPI:                                                                                                                                       Angela Abbott is a 59 y.o. female with past medical history significant for obesity, hypertension, stroke presents to the emergency department with sudden onset numbness/tingling sensation on the right side of her face, most notably around her lip lasted for 3-5 minutes.  Patient states that symptoms started 9:30 PM.  She was by herself and is on aware if she had facial droop.  Patient became upset that she was having another stroke and decided to come to the emergency department.  On arrival to [facility], ER she had no symptoms.  Blood pressure is slightly elevated [id number] systolic.  Glucose 132.  Patient also states that she intermittently has right-sided numbness of her arm and leg but never has had facial involvement which is what made her concerned that she was having a stroke.  She denies any history of migraines/headaches.  Chart review:  Patient has a history of right MCA infarcts in 2019 secondary to right ICA stenosis status post CEA.  She presented at that time with left-sided numbness.  Her last follow-up appointment Dr. Leonie Man and his nurse practitioner was in June 2020.  Patient was continued on aspirin, was scheduled to have a follow-up ultrasound to evaluate right carotid following CEA.  Of note patient also has intracranial atherosclerotic disease including occlusion of the left PCA and severe stenosis at the right PCA.  Date last known well: 124.21 Time last known well: 9:30 PM tPA Given: No symptoms resolved NIHSS: 0 Baseline MRS 0   Past Medical History:  Diagnosis Date  . Arthritis    shoulders, knees, hips  . Carotid artery occlusion   . Dental crowns present   . GERD (gastroesophageal reflux disease)   .  Hypertension   . Hypothyroidism   . Left knee pain 07/2014  . Pneumonia    hx of 02/2017   . Right knee pain   . Stress incontinence   . Stroke [facility])    speech difficulty recalling words  . Wears partial dentures    upper and lower    Past Surgical History:  Procedure Laterality Date  . ABDOMINAL HYSTERECTOMY     partial  . CAROTID ENDARTERECTOMY    . [place]  . ENDARTERECTOMY Right 11/05/2017   Procedure: ENDARTERECTOMY CAROTID RIGHT;  Surgeon: [name], MD;  Location: [facility] OR;  Service: Vascular;  Laterality: Right;  . ESOPHAGOGASTRODUODENOSCOPY N/A 11/26/2012   Procedure: ESOPHAGOGASTRODUODENOSCOPY (EGD);  Surgeon: Shann Medal, MD;  Location: [facility];  Service: General;  Laterality: N/A;  . GANGLION CYST EXCISION Right 02/2008   foot  .  KNEE ARTHROSCOPY WITH LATERAL MENISECTOMY Left 07/21/2014   Procedure: LEFT KNEE ARTHROSCOPY,  CHONDROPLASTY, WITH LATERAL AND MEDIAL  MENISCECTOMIES;  Surgeon: Kathryne Hitch, MD;  Location: [place];  Service: Orthopedics;  Laterality: Left;  . KNEE ARTHROSCOPY WITH MEDIAL MENISECTOMY Left 07/21/2014   Procedure: KNEE ARTHROSCOPY WITH MEDIAL MENISECTOMY;  Surgeon: Kathryne Hitch, MD;  Location: [place];  Service: Orthopedics;  Laterality: Left;  . LAPAROSCOPIC GASTRIC BANDING  02/18/2008  . LAPAROSCOPIC REPAIR AND REMOVAL OF GASTRIC BAND N/A 03/21/2017   Procedure: LAPAROSCOPIC REPAIR AND REMOVAL OF GASTRIC BAND;  Surgeon: [name], MD;  Location: [facility] ORS;  Service: General;  Laterality: N/A;  . PARTIAL HYSTERECTOMY  09/07/2002  . PATCH ANGIOPLASTY Right 11/05/2017   Procedure: PATCH ANGIOPLASTY of right carotid artery using xenosure bovine pericardium patch;  Surgeon: [name], MD;  Location: [place] OR;  Service: Vascular;  Laterality: Right;  . SHOULDER ARTHROSCOPY WITH SUBACROMIAL DECOMPRESSION, ROTATOR CUFF REPAIR AND BICEP TENDON REPAIR Right 11/23/2015   Procedure: RIGHT  SHOULDER ARTHROSCOPY WITH DEBRIDEMENT, SUBACROMIAL DECOMPRESSION, DISTAL CLAVICLE EXCISION, ROTATOR CUFF REPAIR AND BICEP TENODESIS;  Surgeon: Ninetta Lights, MD;  Location: [place];  Service: Orthopedics;  Laterality: Right;    Family History  Problem Relation Age of Onset  . CVA Other   . Diabetes Other   . CAD Other   . CVA Mother   . Cancer Neg Hx    Social History:  reports that she quit smoking about 15 months ago. Her smoking use included cigarettes. She has a 35.00 pack-year smoking history. She has never used smokeless tobacco. She reports current alcohol use. She reports that she does not use drugs.  Allergies:  Allergies  Allergen Reactions  . Bee Venom Shortness Of Breath and Swelling    Medications:                                                                                                                        I reviewed home medications   ROS:                                                                                                                                     14 systems reviewed and negative except above    Examination:  General: Appears well-developed  Psych: Affect appropriate to situation Eyes: No scleral injection HENT: No OP obstrucion Head: Normocephalic.  Cardiovascular: Normal rate and regular rhythm.  Respiratory: Effort normal and breath sounds normal to anterior ascultation GI: Soft.  No distension. There is no tenderness.  Skin: WDI    Neurological Examination Mental Status: Alert, oriented, thought content appropriate.  Speech fluent without evidence of aphasia. Able to follow 3 step commands without difficulty. Cranial Nerves: II: Visual fields grossly normal,  III,IV, VI: ptosis not present, extra-ocular motions intact bilaterally, pupils equal, round, reactive to light and accommodation V,VII: smile  symmetric, facial light touch sensation normal bilaterally VIII: hearing normal bilaterally IX,X: uvula rises symmetrically XI: bilateral shoulder shrug XII: midline tongue extension Motor: Right : Upper extremity   5/5    Left:     Upper extremity   5/5  Lower extremity   5/5     Lower extremity   5/5 Tone and bulk:normal tone throughout; no atrophy noted Sensory: Pinprick and light touch intact throughout, bilaterally Plantars: Right: downgoing   Left: downgoing Cerebellar: normal finger-to-nose, normal rapid alternating movements and normal heel-to-shin test      Lab Results: Basic Metabolic Panel: Recent Labs  Lab 01/31/19 2210 01/31/19 2301  NA 138 138  K 4.3 4.3  CL 102 101  CO2 26  --   GLUCOSE 139* 132*  BUN 15 19  CREATININE 1.06* 1.00  CALCIUM 8.7*  --     CBC: Recent Labs  Lab 01/31/19 2210 01/31/19 2301  WBC 11.2*  --   NEUTROABS 6.8  --   HGB 14.6 15.0  HCT 44.8 44.0  MCV 91.1  --   PLT 230  --     Coagulation Studies: Recent Labs    01/31/19 2210  LABPROT 12.3  INR 0.9    Imaging: CT Angio Head W or Wo Contrast  Result Date: 01/31/2019 CLINICAL DATA:  Facial numbness EXAM: CT ANGIOGRAPHY HEAD AND NECK TECHNIQUE: Multidetector CT imaging of the head and neck was performed using the standard protocol during bolus administration of intravenous contrast. Multiplanar CT image reconstructions and MIPs were obtained to evaluate the vascular anatomy. Carotid stenosis measurements (when applicable) are obtained utilizing NASCET criteria, using the distal internal carotid diameter as the denominator. CONTRAST:  40mL OMNIPAQUE IOHEXOL 350 MG/ML SOLN COMPARISON:  Head CT 01/31/2019 CTA head 10/28/2017 FINDINGS: CTA NECK FINDINGS SKELETON: There is no bony spinal canal stenosis. No lytic or blastic lesion. OTHER NECK: Normal pharynx, larynx and major salivary glands. No cervical lymphadenopathy. Unremarkable thyroid gland. UPPER CHEST: No pneumothorax or  pleural effusion. No nodules or masses. AORTIC ARCH: There is no calcific atherosclerosis of the aortic arch. There is no aneurysm, dissection or hemodynamically significant stenosis of the visualized portion of the aorta. Conventional 3 vessel aortic branching pattern. The visualized proximal subclavian arteries are widely patent. RIGHT CAROTID SYSTEM: No dissection, occlusion or aneurysm. There is noncalcified atherosclerosis extending into the proximal ICA, resulting in 30% stenosis. LEFT CAROTID SYSTEM: No dissection, occlusion or aneurysm. There is noncalcified atherosclerosis extending into the proximal ICA, resulting in 30% stenosis. VERTEBRAL ARTERIES: Right dominant configuration. Both origins are clearly patent. There is no dissection, occlusion or flow-limiting stenosis to the skull base (V1-V3 segments). CTA HEAD FINDINGS POSTERIOR CIRCULATION: --Vertebral arteries: Normal V4 segments. --Posterior inferior cerebellar arteries (PICA): Patent origins from the vertebral arteries. --Anterior inferior cerebellar arteries (AICA): Patent origins from the basilar artery. --Basilar artery: Normal. --Superior cerebellar arteries: Normal. --Posterior cerebral  arteries: The right P2 segment is occluded proximally, unchanged from the prior study. There is reconstitution of the distal P2 segment patency of the remainder of the artery. The left PCA is normal. Posterior communicating arteries are diminutive or absent. ANTERIOR CIRCULATION: --Intracranial internal carotid arteries: Normal. --Anterior cerebral arteries (ACA): Normal. Both A1 segments are present. Patent anterior communicating artery (a-comm). --Middle cerebral arteries (MCA): Normal. VENOUS SINUSES: As permitted by contrast timing, patent. ANATOMIC VARIANTS: None Review of the MIP images confirms the above findings. IMPRESSION: 1. No emergent large vessel occlusion. 2. 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 3.  30% stenosis of both proximal internal carotid arteries secondary to noncalcified atherosclerotic plaque. R ICA stenosis has improved following endarterectomy. Electronically Signed   By: Ulyses Jarred M.D.   On: 01/31/2019 23:50   CT HEAD WO CONTRAST  Result Date: 01/31/2019 CLINICAL DATA:  Right side numbness, facial droop. EXAM: CT HEAD WITHOUT CONTRAST TECHNIQUE: Contiguous axial images were obtained from the base of the skull through the vertex without intravenous contrast. COMPARISON:  11/01/2017.  MRI 11/02/2017. FINDINGS: Brain: Small cortical infarcts noted in the posterior right frontal lobe. This is old, noted on MRI from 11/02/2017. No acute infarcts. No hemorrhage or hydrocephalus. Vascular: No hyperdense vessel or unexpected calcification. Skull: No acute calvarial abnormality. Sinuses/Orbits: Visualized paranasal sinuses and mastoids clear. Orbital soft tissues unremarkable. Other: None IMPRESSION: Small old posterior right frontal cortical infarct. Atrophy, chronic microvascular disease. No acute intracranial abnormality. Electronically Signed   By: Rolm Baptise M.D.   On: 01/31/2019 22:41   CT Angio Neck W and/or Wo Contrast  Result Date: 01/31/2019 CLINICAL DATA:  Facial numbness EXAM: CT ANGIOGRAPHY HEAD AND NECK TECHNIQUE: Multidetector CT imaging of the head and neck was performed using the standard protocol during bolus administration of intravenous contrast. Multiplanar CT image reconstructions and MIPs were obtained to evaluate the vascular anatomy. Carotid stenosis measurements (when applicable) are obtained utilizing NASCET criteria, using the distal internal carotid diameter as the denominator. CONTRAST:  74mL OMNIPAQUE IOHEXOL 350 MG/ML SOLN COMPARISON:  Head CT 01/31/2019 CTA head 10/28/2017 FINDINGS: CTA NECK FINDINGS SKELETON: There is no bony spinal canal stenosis. No lytic or blastic lesion. OTHER NECK: Normal pharynx, larynx and major salivary glands. No cervical  lymphadenopathy. Unremarkable thyroid gland. UPPER CHEST: No pneumothorax or pleural effusion. No nodules or masses. AORTIC ARCH: There is no calcific atherosclerosis of the aortic arch. There is no aneurysm, dissection or hemodynamically significant stenosis of the visualized portion of the aorta. Conventional 3 vessel aortic branching pattern. The visualized proximal subclavian arteries are widely patent. RIGHT CAROTID SYSTEM: No dissection, occlusion or aneurysm. There is noncalcified atherosclerosis extending into the proximal ICA, resulting in 30% stenosis. LEFT CAROTID SYSTEM: No dissection, occlusion or aneurysm. There is noncalcified atherosclerosis extending into the proximal ICA, resulting in 30% stenosis. VERTEBRAL ARTERIES: Right dominant configuration. Both origins are clearly patent. There is no dissection, occlusion or flow-limiting stenosis to the skull base (V1-V3 segments). CTA HEAD FINDINGS POSTERIOR CIRCULATION: --Vertebral arteries: Normal V4 segments. --Posterior inferior cerebellar arteries (PICA): Patent origins from the vertebral arteries. --Anterior inferior cerebellar arteries (AICA): Patent origins from the basilar artery. --Basilar artery: Normal. --Superior cerebellar arteries: Normal. --Posterior cerebral arteries: The right P2 segment is occluded proximally, unchanged from the prior study. There is reconstitution of the distal P2 segment patency of the remainder of the artery. The left PCA is normal. Posterior communicating arteries are diminutive or absent. ANTERIOR CIRCULATION: --  Intracranial internal carotid arteries: Normal. --Anterior cerebral arteries (ACA): Normal. Both A1 segments are present. Patent anterior communicating artery (a-comm). --Middle cerebral arteries (MCA): Normal. VENOUS SINUSES: As permitted by contrast timing, patent. ANATOMIC VARIANTS: None Review of the MIP images confirms the above findings. IMPRESSION: 1. No emergent large vessel occlusion. 2. Chronic  occlusion of the proximal P2 segment of the right posterior cerebral artery. 3. 30% stenosis of both proximal internal carotid arteries secondary to noncalcified atherosclerotic plaque. R ICA stenosis has improved following endarterectomy. Electronically Signed   By: Ulyses Jarred M.D.   On: 01/31/2019 23:50   MR BRAIN WO CONTRAST  Result Date: 02/01/2019 CLINICAL DATA:  Facial numbness EXAM: MRI HEAD WITHOUT CONTRAST TECHNIQUE: Multiplanar, multiecho pulse sequences of the brain and surrounding structures were obtained without intravenous contrast. COMPARISON:  Brain MRI 11/01/2017 FINDINGS: BRAIN: No acute infarct, acute hemorrhage or extra-axial collection. There is an old right precentral gyrus infarct, unchanged. Normal volume of brain parenchyma and CSF spaces. Midline structures are normal. VASCULAR: Major flow voids are preserved. Small amount of hemosiderin deposition of the old infarct site. SKULL AND UPPER CERVICAL SPINE: Normal calvarium and skull base. Visualized upper cervical spine and soft tissues are normal. SINUSES/ORBITS: No fluid levels or advanced mucosal thickening. No mastoid or middle ear effusion. Normal orbits. IMPRESSION: Old right precentral gyrus subcortical infarct. Otherwise normal brain MRI. Electronically Signed   By: Ulyses Jarred M.D.   On: 02/01/2019 00:12     ASSESSMENT AND PLAN  59 y.o. female with past medical history significant for obesity, hypertension, stroke presents to the emergency department with sudden onset numbness/tingling sensation on the right side of her face/lip  lasted for 3-5 minutes.  Symptoms completely resolved.  MRI brain was negative for acute stroke, CT angiogram was repeated which was stable.  Unclear if episode actually represents an ischemic event, description of numbness appears to be paresthesias around her mouth.  Also patient has history of intermittent right arm and leg numbness going on for several months.  Differential diagnosis  including migraine/anxiety.  Given risk factors, it could be possible patient may have passed thalamic TIA likely from uncontrolled risk factors.  Patient has already completed MRI brain and CTA, would recommend discharge with 3 weeks of Plavix in addition to aspirin with outpatient follow-up with Dr. Riley Lam  [facility] Pager Number [phone]

## 2019-02-01 NOTE — ED Provider Notes (Signed)
Assumed care from [place] at shift change.  See prior note for full H&P.  Briefly 59 year old female here with brief episode of tingling on the right side of her face today.  Has history of CVA in 2019.  No significant deficits noted on exam here.  After discussion with neurology, recommended CTA of the head and neck as well as MRI brain.  Neurology will evaluate.  Plan:  Scans pending.  Will follow up and discuss with neuro for recommendations.  Results for orders placed or performed during the hospital encounter of 01/31/19  Protime-INR  Result Value Ref Range   Prothrombin Time 12.3 11.4 - 15.2 seconds   INR 0.9 0.8 - 1.2  APTT  Result Value Ref Range   aPTT 27 24 - 36 seconds  CBC  Result Value Ref Range   WBC 11.2 (H) 4.0 - 10.5 K/uL   RBC 4.92 3.87 - 5.11 MIL/uL   Hemoglobin 14.6 12.0 - 15.0 g/dL   HCT 44.8 36.0 - 46.0 %   MCV 91.1 80.0 - 100.0 fL   MCH 29.7 26.0 - 34.0 pg   MCHC 32.6 30.0 - 36.0 g/dL   RDW 13.9 11.5 - 15.5 %   Platelets 230 150 - 400 K/uL   nRBC 0.0 0.0 - 0.2 %  Differential  Result Value Ref Range   Neutrophils Relative % 61 %   Neutro Abs 6.8 1.7 - 7.7 K/uL   Lymphocytes Relative 26 %   Lymphs Abs 2.9 0.7 - 4.0 K/uL   Monocytes Relative 11 %   Monocytes Absolute 1.3 (H) 0.1 - 1.0 K/uL   Eosinophils Relative 1 %   Eosinophils Absolute 0.1 0.0 - 0.5 K/uL   Basophils Relative 0 %   Basophils Absolute 0.1 0.0 - 0.1 K/uL   Immature Granulocytes 1 %   Abs Immature Granulocytes 0.06 0.00 - 0.07 K/uL  Comprehensive metabolic panel  Result Value Ref Range   Sodium 138 135 - 145 mmol/L   Potassium 4.3 3.5 - 5.1 mmol/L   Chloride 102 98 - 111 mmol/L   CO2 26 22 - 32 mmol/L   Glucose, Bld 139 (H) 70 - 99 mg/dL   BUN 15 6 - 20 mg/dL   Creatinine, Ser 1.06 (H) 0.44 - 1.00 mg/dL   Calcium 8.7 (L) 8.9 - 10.3 mg/dL   Total Protein 6.3 (L) 6.5 - 8.1 g/dL   Albumin 3.1 (L) 3.5 - 5.0 g/dL   AST 18 15 - 41 U/L   ALT 21 0 - 44 U/L   Alkaline Phosphatase 89 38  - 126 U/L   Total Bilirubin 0.7 0.3 - 1.2 mg/dL   GFR calc non Af Amer 57 (L) >60 mL/min   GFR calc Af Amer >60 >60 mL/min   Anion gap 10 5 - 15  I-stat chem 8, ED  Result Value Ref Range   Sodium 138 135 - 145 mmol/L   Potassium 4.3 3.5 - 5.1 mmol/L   Chloride 101 98 - 111 mmol/L   BUN 19 6 - 20 mg/dL   Creatinine, Ser 1.00 0.44 - 1.00 mg/dL   Glucose, Bld 132 (H) 70 - 99 mg/dL   Calcium, Ion 1.16 1.15 - 1.40 mmol/L   TCO2 32 22 - 32 mmol/L   Hemoglobin 15.0 12.0 - 15.0 g/dL   HCT 44.0 36.0 - 46.0 %  CBG monitoring, ED  Result Value Ref Range   Glucose-Capillary 134 (H) 70 - 99 mg/dL  I-Stat beta hCG blood,  ED  Result Value Ref Range   I-stat hCG, quantitative <5.0 <5 mIU/mL   Comment 3           CT Angio Head W or Wo Contrast  Result Date: 01/31/2019 CLINICAL DATA:  Facial numbness EXAM: CT ANGIOGRAPHY HEAD AND NECK TECHNIQUE: Multidetector CT imaging of the head and neck was performed using the standard protocol during bolus administration of intravenous contrast. Multiplanar CT image reconstructions and MIPs were obtained to evaluate the vascular anatomy. Carotid stenosis measurements (when applicable) are obtained utilizing NASCET criteria, using the distal internal carotid diameter as the denominator. CONTRAST:  8mL OMNIPAQUE IOHEXOL 350 MG/ML SOLN COMPARISON:  Head CT 01/31/2019 CTA head 10/28/2017 FINDINGS: CTA NECK FINDINGS SKELETON: There is no bony spinal canal stenosis. No lytic or blastic lesion. OTHER NECK: Normal pharynx, larynx and major salivary glands. No cervical lymphadenopathy. Unremarkable thyroid gland. UPPER CHEST: No pneumothorax or pleural effusion. No nodules or masses. AORTIC ARCH: There is no calcific atherosclerosis of the aortic arch. There is no aneurysm, dissection or hemodynamically significant stenosis of the visualized portion of the aorta. Conventional 3 vessel aortic branching pattern. The visualized proximal subclavian arteries are widely patent.  RIGHT CAROTID SYSTEM: No dissection, occlusion or aneurysm. There is noncalcified atherosclerosis extending into the proximal ICA, resulting in 30% stenosis. LEFT CAROTID SYSTEM: No dissection, occlusion or aneurysm. There is noncalcified atherosclerosis extending into the proximal ICA, resulting in 30% stenosis. VERTEBRAL ARTERIES: Right dominant configuration. Both origins are clearly patent. There is no dissection, occlusion or flow-limiting stenosis to the skull base (V1-V3 segments). CTA HEAD FINDINGS POSTERIOR CIRCULATION: --Vertebral arteries: Normal V4 segments. --Posterior inferior cerebellar arteries (PICA): Patent origins from the vertebral arteries. --Anterior inferior cerebellar arteries (AICA): Patent origins from the basilar artery. --Basilar artery: Normal. --Superior cerebellar arteries: Normal. --Posterior cerebral arteries: The right P2 segment is occluded proximally, unchanged from the prior study. There is reconstitution of the distal P2 segment patency of the remainder of the artery. The left PCA is normal. Posterior communicating arteries are diminutive or absent. ANTERIOR CIRCULATION: --Intracranial internal carotid arteries: Normal. --Anterior cerebral arteries (ACA): Normal. Both A1 segments are present. Patent anterior communicating artery (a-comm). --Middle cerebral arteries (MCA): Normal. VENOUS SINUSES: As permitted by contrast timing, patent. ANATOMIC VARIANTS: None Review of the MIP images confirms the above findings. IMPRESSION: 1. No emergent large vessel occlusion. 2. 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 3. 30% stenosis of both proximal internal carotid arteries secondary to noncalcified atherosclerotic plaque. R ICA stenosis has improved following endarterectomy. Electronically Signed   By: Ulyses Jarred M.D.   On: 01/31/2019 23:50   CT HEAD WO CONTRAST  Result Date: 01/31/2019 CLINICAL DATA:  Right side numbness, facial droop.  EXAM: CT HEAD WITHOUT CONTRAST TECHNIQUE: Contiguous axial images were obtained from the base of the skull through the vertex without intravenous contrast. COMPARISON:  11/01/2017.  MRI 11/02/2017. FINDINGS: Brain: Small cortical infarcts noted in the posterior right frontal lobe. This is old, noted on MRI from 11/02/2017. No acute infarcts. No hemorrhage or hydrocephalus. Vascular: No hyperdense vessel or unexpected calcification. Skull: No acute calvarial abnormality. Sinuses/Orbits: Visualized paranasal sinuses and mastoids clear. Orbital soft tissues unremarkable. Other: None IMPRESSION: Small old posterior right frontal cortical infarct. Atrophy, chronic microvascular disease. No acute intracranial abnormality. Electronically Signed   By: Rolm Baptise M.D.   On: 01/31/2019 22:41   CT Angio Neck W and/or Wo Contrast  Result Date: 01/31/2019 CLINICAL DATA:  Facial numbness EXAM: CT ANGIOGRAPHY HEAD AND NECK TECHNIQUE: Multidetector CT imaging of the head and neck was performed using the standard protocol during bolus administration of intravenous contrast. Multiplanar CT image reconstructions and MIPs were obtained to evaluate the vascular anatomy. Carotid stenosis measurements (when applicable) are obtained utilizing NASCET criteria, using the distal internal carotid diameter as the denominator. CONTRAST:  30mL OMNIPAQUE IOHEXOL 350 MG/ML SOLN COMPARISON:  Head CT 01/31/2019 CTA head 10/28/2017 FINDINGS: CTA NECK FINDINGS SKELETON: There is no bony spinal canal stenosis. No lytic or blastic lesion. OTHER NECK: Normal pharynx, larynx and major salivary glands. No cervical lymphadenopathy. Unremarkable thyroid gland. UPPER CHEST: No pneumothorax or pleural effusion. No nodules or masses. AORTIC ARCH: There is no calcific atherosclerosis of the aortic arch. There is no aneurysm, dissection or hemodynamically significant stenosis of the visualized portion of the aorta. Conventional 3 vessel aortic branching  pattern. The visualized proximal subclavian arteries are widely patent. RIGHT CAROTID SYSTEM: No dissection, occlusion or aneurysm. There is noncalcified atherosclerosis extending into the proximal ICA, resulting in 30% stenosis. LEFT CAROTID SYSTEM: No dissection, occlusion or aneurysm. There is noncalcified atherosclerosis extending into the proximal ICA, resulting in 30% stenosis. VERTEBRAL ARTERIES: Right dominant configuration. Both origins are clearly patent. There is no dissection, occlusion or flow-limiting stenosis to the skull base (V1-V3 segments). CTA HEAD FINDINGS POSTERIOR CIRCULATION: --Vertebral arteries: Normal V4 segments. --Posterior inferior cerebellar arteries (PICA): Patent origins from the vertebral arteries. --Anterior inferior cerebellar arteries (AICA): Patent origins from the basilar artery. --Basilar artery: Normal. --Superior cerebellar arteries: Normal. --Posterior cerebral arteries: The right P2 segment is occluded proximally, unchanged from the prior study. There is reconstitution of the distal P2 segment patency of the remainder of the artery. The left PCA is normal. Posterior communicating arteries are diminutive or absent. ANTERIOR CIRCULATION: --Intracranial internal carotid arteries: Normal. --Anterior cerebral arteries (ACA): Normal. Both A1 segments are present. Patent anterior communicating artery (a-comm). --Middle cerebral arteries (MCA): Normal. VENOUS SINUSES: As permitted by contrast timing, patent. ANATOMIC VARIANTS: None Review of the MIP images confirms the above findings. IMPRESSION: 1. No emergent large vessel occlusion. 2. 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 3. 30% stenosis of both proximal internal carotid arteries secondary to noncalcified atherosclerotic plaque. R ICA stenosis has improved following endarterectomy. Electronically Signed   By: Ulyses Jarred M.D.   On: 01/31/2019 23:50   MR BRAIN WO  CONTRAST  Result Date: 02/01/2019 CLINICAL DATA:  Facial numbness EXAM: MRI HEAD WITHOUT CONTRAST TECHNIQUE: Multiplanar, multiecho pulse sequences of the brain and surrounding structures were obtained without intravenous contrast. COMPARISON:  Brain MRI 11/01/2017 FINDINGS: BRAIN: No acute infarct, acute hemorrhage or extra-axial collection. There is an old right precentral gyrus infarct, unchanged. Normal volume of brain parenchyma and CSF spaces. Midline structures are normal. VASCULAR: Major flow voids are preserved. Small amount of hemosiderin deposition of the old infarct site. SKULL AND UPPER CERVICAL SPINE: Normal calvarium and skull base. Visualized upper cervical spine and soft tissues are normal. SINUSES/ORBITS: No fluid levels or advanced mucosal thickening. No mastoid or middle ear effusion. Normal orbits. IMPRESSION: Old right precentral gyrus subcortical infarct. Otherwise normal brain MRI. Electronically Signed   By: Ulyses Jarred M.D.   On: 02/01/2019 00:12   DG Foot 2 Views Right  Result Date: 01/22/2019 Please see detailed radiograph report in office note.  MRI brain with findings of old precentral gyrus subcortical infarct, otherwise normal brain.  CTA of the head and  neck with chronic occlusion of the proximal P2 segment of the right posterior cerebral artery.  Also has some mild carotid stenosis bilaterally.    Dr. Lorraine Lax with neurology has evaluated-- no indication for admission but will start plavix for 3 weeks and have her follow-up with OP neurologist, Dr. Leonie Man.  Patient agreeable and comfortable with care plan.  Advised to monitor for any signs of blood in the stool or other abnormal bleeding while taking plavix.  She may return here for any new/acute changes.   Larene Pickett, PA-C 02/01/19 0209    Ward, Delice Bison, DO 02/01/19 [phone]

## 2019-02-01 NOTE — ED Notes (Signed)
Patient verbalizes understanding of discharge instructions. Opportunity for questioning and answers were provided. Armband removed by staff, pt discharged from ED. Pt. ambulatory and discharged home.  

## 2019-02-01 NOTE — Discharge Instructions (Addendum)
Take the prescribed medication as directed.  Monitor for any signs of bleeding-- blood in stool, bleeding of gums, etc. Follow-up with your primary care doctor.  Also recommend to follow-up closely with your neurologist. Return to the ED for new or worsening symptoms.

## 2019-02-08 ENCOUNTER — Encounter: Payer: Self-pay | Admitting: Podiatry

## 2019-02-08 ENCOUNTER — Other Ambulatory Visit: Payer: Self-pay

## 2019-02-08 ENCOUNTER — Ambulatory Visit: Payer: 59 | Admitting: Podiatry

## 2019-02-08 DIAGNOSIS — M722 Plantar fascial fibromatosis: Secondary | ICD-10-CM | POA: Diagnosis not present

## 2019-02-08 DIAGNOSIS — M779 Enthesopathy, unspecified: Secondary | ICD-10-CM

## 2019-02-08 NOTE — Progress Notes (Signed)
Subjective:   Patient ID: Angela Abbott, female   DOB: 59 y.o.   MRN: [id number]   HPI Patient states she is feeling a lot better and states that it seems that what we did has really made a big difference for her but feels like she needs a few more days off of work   ROS      Objective:  Physical Exam  Neurovascular status intact with patient's feet improved with the right forefoot left heel doing much better than previously with inflammation still noted only upon deep palpation     Assessment:  Inflammatory capsulitis fasciitis which has improved     Plan:  H&P reviewed conditions and recommended that she continue with orthotics brace usage and splinting.  Patient will be seen back I spent a great deal time with her today going over the difficulty of the different conditions she has had and my hope that this will be the end of her symptoms

## 2019-03-04 ENCOUNTER — Other Ambulatory Visit: Payer: Self-pay

## 2019-03-04 ENCOUNTER — Encounter: Payer: Self-pay | Admitting: Podiatry

## 2019-03-04 ENCOUNTER — Ambulatory Visit: Payer: 59 | Admitting: Podiatry

## 2019-03-04 VITALS — Temp 97.3°F

## 2019-03-04 DIAGNOSIS — M778 Other enthesopathies, not elsewhere classified: Secondary | ICD-10-CM

## 2019-03-04 DIAGNOSIS — M779 Enthesopathy, unspecified: Secondary | ICD-10-CM

## 2019-03-04 DIAGNOSIS — M722 Plantar fascial fibromatosis: Secondary | ICD-10-CM | POA: Diagnosis not present

## 2019-03-05 NOTE — Progress Notes (Signed)
Subjective:   Patient ID: Angela Abbott, female   DOB: 59 y.o.   MRN: [id number]   HPI Patient states the left heel is feeling a lot better but the right forefoot is still very sore and ultimately she knows may require surgery but she is trying to avoid this due to work   ROS      Objective:  Physical Exam  Neurovascular status intact with discomfort of the left plantar fascial improving with mild discomfort still noted in right forefoot found to have discomfort around the third MPJ which is sore and swollen     Assessment:  Fasciitis left improving with capsulitis right quite sore     Plan:  H&P reviewed conditions and I have recommended no treatment currently for the left except for occasional boot and night splint usage along with orthotics and for the right I did reinject 3 mg dexamethasone Kenalog and discussed possible shortening osteotomy depending on response

## 2019-03-18 ENCOUNTER — Other Ambulatory Visit: Payer: Self-pay

## 2019-03-18 ENCOUNTER — Encounter: Payer: Self-pay | Admitting: Podiatry

## 2019-03-18 ENCOUNTER — Ambulatory Visit: Payer: 59 | Admitting: Podiatry

## 2019-03-18 DIAGNOSIS — M779 Enthesopathy, unspecified: Secondary | ICD-10-CM | POA: Diagnosis not present

## 2019-03-18 DIAGNOSIS — M722 Plantar fascial fibromatosis: Secondary | ICD-10-CM

## 2019-03-19 ENCOUNTER — Telehealth: Payer: Self-pay | Admitting: Podiatry

## 2019-03-19 NOTE — Progress Notes (Signed)
Subjective:   Patient ID: Angela Abbott, female   DOB: 59 y.o.   MRN: [id number]   HPI Patient states my left heel has become sore again and the right foot the joint seems to be somewhat better and I know someday he may need surgery but I am trying to hold off due to work   ROS      Objective:  Physical Exam  Neurovascular status intact with area of exquisite discomfort plantar aspect left heel with equinus condition also noted an improvement of last third MPJ with pain only upon deep palpation     Assessment:  Continued fasciitis left able to get under control that it seems that it reoccurs quite easily with patient noted to have improved capsulitis     Plan:  H&P conditions reviewed and for the left I did do sterile prep and injected the fascia 3 mg Kenalog 5 mg Xylocaine and stated that I want her to use her night splint and boot as necessary and orthotics and for the right I do not recommend further treatment and I did discuss possibility for left endoscopic surgery with gastroc recession.  Patient will be seen back as symptoms indicate

## 2019-03-19 NOTE — Telephone Encounter (Signed)
Patient would like to have intermittent FMLA, until she decides if she wants to have surgery. Please advise?

## 2019-04-06 ENCOUNTER — Other Ambulatory Visit: Payer: Self-pay | Admitting: General Surgery

## 2019-04-06 ENCOUNTER — Other Ambulatory Visit (HOSPITAL_COMMUNITY): Payer: Self-pay | Admitting: General Surgery

## 2019-04-08 ENCOUNTER — Other Ambulatory Visit: Payer: Self-pay

## 2019-04-08 ENCOUNTER — Ambulatory Visit: Payer: 59 | Admitting: Podiatry

## 2019-04-08 ENCOUNTER — Encounter: Payer: Self-pay | Admitting: Podiatry

## 2019-04-08 DIAGNOSIS — M722 Plantar fascial fibromatosis: Secondary | ICD-10-CM | POA: Diagnosis not present

## 2019-04-08 NOTE — Progress Notes (Signed)
Subjective:   Patient ID: Angela Abbott, female   DOB: 59 y.o.   MRN: [id number]   HPI Patient presents stating that his heel is awful and she needs to get it fixed   ROS      Objective:  Physical Exam  Neurovascular status intact continued exquisite discomfort plantar aspect left heel despite numerous conservative treatments that we have accomplished with chronic pain and inability to walk with the right foot feeling pretty good at this time     Assessment:  Chronic plantar fasciitis left that is failed to respond to numerous conservative treatments     Plan:  Reviewed condition great length and due to the long-term nature and the amount of things we have done without relief surgical intervention has been recommended and she wants this done.  I explained endoscopic plantar fasciotomy reviewed the procedure allowed her to consent form explaining all possible complications associated with surgery.  Patient is willing to accept the risk of procedure understanding everything is outlined and at this time signed consent and after extensive review.  Scheduled for outpatient surgery and understands total recovery can take 6 months to 1 year

## 2019-04-08 NOTE — Patient Instructions (Signed)
Pre-Operative Instructions  Congratulations, you have decided to take an important step towards improving your quality of life.  You can be assured that the doctors and staff at [facility] will be with you every step of the way.  Here are some important things you should know:  1. Plan to be at the surgery center/hospital at least 1 (one) hour prior to your scheduled time, unless otherwise directed by the surgical center/hospital staff.  You must have a responsible adult accompany you, remain during the surgery and drive you home.  Make sure you have directions to the surgical center/hospital to ensure you arrive on time. 2. If you are having surgery at [facility] or [facility], you will need a copy of your medical history and physical form from your family physician within one month prior to the date of surgery. We will give you a form for your primary physician to complete.  3. We make every effort to accommodate the date you request for surgery.  However, there are times where surgery dates or times have to be moved.  We will contact you as soon as possible if a change in schedule is required.   4. No aspirin/ibuprofen for one week before surgery.  If you are on aspirin, any non-steroidal anti-inflammatory medications (Mobic, Aleve, Ibuprofen) should not be taken seven (7) days prior to your surgery.  You make take Tylenol for pain prior to surgery.  5. Medications - If you are taking daily heart and blood pressure medications, seizure, reflux, allergy, asthma, anxiety, pain or diabetes medications, make sure you notify the surgery center/hospital before the day of surgery so they can tell you which medications you should take or avoid the day of surgery. 6. No food or drink after midnight the night before surgery unless directed otherwise by surgical center/hospital staff. 7. No alcoholic beverages 24-hours prior to surgery.  No smoking 24-hours prior or 24-hours after  surgery. 8. Wear loose pants or shorts. They should be loose enough to fit over bandages, boots, and casts. 9. Don't wear slip-on shoes. Sneakers are preferred. 10. Bring your boot with you to the surgery center/hospital.  Also bring crutches or a walker if your physician has prescribed it for you.  If you do not have this equipment, it will be provided for you after surgery. 11. If you have not been contacted by the surgery center/hospital by the day before your surgery, call to confirm the date and time of your surgery. 12. Leave-time from work may vary depending on the type of surgery you have.  Appropriate arrangements should be made prior to surgery with your employer. 13. Prescriptions will be provided immediately following surgery by your doctor.  Fill these as soon as possible after surgery and take the medication as directed. Pain medications will not be refilled on weekends and must be approved by the doctor. 14. Remove nail polish on the operative foot and avoid getting pedicures prior to surgery. 15. Wash the night before surgery.  The night before surgery wash the foot and leg well with water and the antibacterial soap provided. Be sure to pay special attention to beneath the toenails and in between the toes.  Wash for at least three (3) minutes. Rinse thoroughly with water and dry well with a towel.  Perform this wash unless told not to do so by your physician.  Enclosed: 1 Ice pack (please put in freezer the night before surgery)   1 Hibiclens skin cleaner     Pre-op instructions  If you have any questions regarding the instructions, please do not hesitate to call our office.  [place]: [address], [place], [place] [address] -- [phone]  [place]: [address] [place], [place] [address] -- [phone]  [place]: [address], [place], [place] [address] -- [phone]   Website: [email or web address] 

## 2019-04-12 ENCOUNTER — Telehealth: Payer: Self-pay

## 2019-04-12 NOTE — Telephone Encounter (Addendum)
DOS 04/20/2019   EPF LEFT - [id number]  [employer] EFFECTIVE DATE - 01/08/2019  PLAN DEDUCTIBLE - $0.00 OUT OF POCKET - $500 W/ $261.00 REMAINING COPAY $125.00  COINSURANCE - 0%  CASE DETAILS NOTIFICATION/PRIOR AUTHORIZATION NUMBER CASE STATUS CASE STATUS REASON PRIMARY CARE PHYSICIAN [id number] Closed Case Was Managed And Is Now Complete - ADVANCE NOTIFY DATE/TIME ADMISSION NOTIFY DATE/TIME 04/12/2019 02:25 PM CDT - COVERAGE STATUS OVERALL COVERAGE STATUS Covered/Approved 1-1 CODE DESCRIPTION COVERAGE STATUS DECISION DATE [facility] Coverage determination is reflected for the facility admission and is not a guarantee of payment for ongoing services. Covered/Approved 04/13/2019 1 [id number] Endoscopic plantar fasciotomy Covered/Approved 04/13/2019

## 2019-04-14 ENCOUNTER — Encounter: Payer: Self-pay | Admitting: Podiatry

## 2019-04-19 MED ORDER — HYDROCODONE-ACETAMINOPHEN 10-325 MG PO TABS: 1.0000 | ORAL_TABLET | Freq: Three times a day (TID) | ORAL | 0 refills | Status: AC | PRN

## 2019-04-19 NOTE — Addendum Note (Signed)
Addended by: Wallene Huh on: 04/19/2019 05:14 PM   Modules accepted: Orders

## 2019-04-20 ENCOUNTER — Encounter: Payer: Self-pay | Admitting: Podiatry

## 2019-04-20 DIAGNOSIS — M722 Plantar fascial fibromatosis: Secondary | ICD-10-CM

## 2019-04-21 ENCOUNTER — Telehealth: Payer: Self-pay

## 2019-04-21 NOTE — Telephone Encounter (Signed)
Called pt to follow up on post op. Pt stated that she feels ok, had a little swelling and bleeding early today. I told the pt to make sure she continues to elevate and ice her foot as needed. As of right now the bleeding has stopped. The pt bandage is dry and intact. She only took pain meds once and that was yesterday. The med made her a little sick but hydro normally does. I asked the pain to state her pain scored and she stated that it was only a 1 out 10. I confirmed that her appointment is schedule for next Thursday 04/29/19 at 115 pm. I told her to please make sure she calls the office if she has any question or concerns.

## 2019-04-27 ENCOUNTER — Ambulatory Visit (HOSPITAL_COMMUNITY)
Admission: RE | Admit: 2019-04-27 | Discharge: 2019-04-27 | Disposition: A | Discharge status: Home / Self Care | Payer: 59 | Source: Ambulatory Visit | Attending: General Surgery | Admitting: General Surgery

## 2019-04-27 ENCOUNTER — Other Ambulatory Visit (HOSPITAL_COMMUNITY): Payer: Self-pay | Admitting: General Surgery

## 2019-04-27 ENCOUNTER — Other Ambulatory Visit: Payer: Self-pay

## 2019-04-29 ENCOUNTER — Other Ambulatory Visit: Payer: Self-pay

## 2019-04-29 ENCOUNTER — Encounter: Payer: Self-pay | Admitting: Podiatry

## 2019-04-29 ENCOUNTER — Ambulatory Visit (INDEPENDENT_AMBULATORY_CARE_PROVIDER_SITE_OTHER): Payer: 59 | Admitting: Podiatry

## 2019-04-29 VITALS — BP 142/85 | HR 68 | Temp 97.1°F | Resp 16

## 2019-04-29 DIAGNOSIS — M778 Other enthesopathies, not elsewhere classified: Secondary | ICD-10-CM

## 2019-04-29 DIAGNOSIS — M779 Enthesopathy, unspecified: Secondary | ICD-10-CM

## 2019-04-29 DIAGNOSIS — M722 Plantar fascial fibromatosis: Secondary | ICD-10-CM

## 2019-04-29 NOTE — Progress Notes (Signed)
Subjective:   Patient ID: Angela Abbott, female   DOB: 59 y.o.   MRN: [id number]   HPI Patient states the left foot is doing well with discomfort quite a bit reduced and patient walking with a good heel toe pattern and right she states has flared up and she needs an injection   ROS      Objective:  Physical Exam  Neurovascular status intact negative Bevelyn Buckles' sign noted wound edges healed well left stitches intact plantar pain reduced quite a bit with the right showing quite a bit of inflammation pain in the third MPJ     Assessment:  Inflammatory capsulitis third MPJ right flared up with well-healing surgical site left     Plan:  H&P reviewed both conditions and for the left I went ahead and dispensed surgical shoe which she may gradually begin wearing and continue boot and for the right did forefoot block sterile prep done aspirated the third MPJ getting out a small amount of clear fluid injected quarter cc dexamethasone Kenalog and applied padding to reduce pressure on the joint surface. Reappoint 2 weeks stitch removal earlier if needed

## 2019-05-05 ENCOUNTER — Other Ambulatory Visit: Payer: Self-pay

## 2019-05-05 ENCOUNTER — Encounter: Payer: 59 | Attending: General Surgery | Admitting: Skilled Nursing Facility1

## 2019-05-05 DIAGNOSIS — E669 Obesity, unspecified: Secondary | ICD-10-CM | POA: Insufficient documentation

## 2019-05-05 NOTE — Progress Notes (Signed)
Nutrition Assessment for Bariatric Surgery Medical Nutrition Therapy  Patient was seen on 05/05/2019 for Pre-Operative Nutrition Assessment. Letter of approval faxed to [facility] bariatric surgery program coordinator on 05/05/2019  Referral stated Supervised Weight Loss (SWL) visits needed: 0  Planned surgery: RYGB Pt expectation of surgery: to lose weight  Pt expectation of dietitian: none stated    NUTRITION ASSESSMENT   Anthropometrics  Start weight at [facility]: 265 lbs (date: 05/05/2019)  Height: 66 in BMI: 42.79 kg/m2     Clinical  Medical hx:  Medications: see list Labs:  Notable signs/symptoms:   Lifestyle & Dietary Hx  Pt states she is an emotional eater. Pt states her sister is her support system.   24-Hr Dietary Recall First Meal: coffee + cream and belvita cookies Snack:  Second Meal: skipped or sandwich and chips Snack: cheese Third Meal: chicken salad and corn Snack:  Beverages: water, gingerale   Estimated Energy Needs Calories: 1500 Carbohydrate: 170g Protein: 112g Fat: 42g   NUTRITION DIAGNOSIS  Overweight/obesity (Saxis-3.3) related to past poor dietary habits and physical inactivity as evidenced by patient w/ planned RYGB surgery following dietary guidelines for continued weight loss.    NUTRITION INTERVENTION  Nutrition counseling (C-1) and education (E-2) to facilitate bariatric surgery goals.   Pre-Op Goals Reviewed with the Patient . Track food and beverage intake (pen and paper, MyFitness Pal, Baritastic app, etc.) . Make healthy food choices while monitoring portion sizes . Consume 3 meals per day or try to eat every 3-5 hours . Avoid concentrated sugars and fried foods . Keep sugar & fat in the single digits per serving on food labels . Practice CHEWING your food (aim for applesauce consistency) . Practice not drinking 15 minutes before, during, and 30 minutes after each meal and snack . Avoid all carbonated beverages (ex:  soda, sparkling beverages)  . Limit caffeinated beverages (ex: coffee, tea, energy drinks) . Avoid all sugar-sweetened beverages (ex: regular soda, sports drinks)  . Avoid alcohol  . Aim for 64-100 ounces of FLUID daily (with at least half of fluid intake being plain water)  . Aim for at least 60-80 grams of PROTEIN daily . Look for a liquid protein source that contains ?15 g protein and ?5 g carbohydrate (ex: shakes, drinks, shots) . Make a list of non-food related activities . Physical activity is an important part of a healthy lifestyle so keep it moving! The goal is to reach 150 minutes of exercise per week, including cardiovascular and weight baring activity.  *Goals that are bolded indicate the pt would like to start working towards these  Handouts Provided Include  . Bariatric Surgery handouts (Nutrition Visits, Pre-Op Goals, Protein Shakes, Vitamins & Minerals)  Learning Style & Readiness for Change Teaching method utilized: Visual & Auditory  Demonstrated degree of understanding via: Teach Back  Barriers to learning/adherence to lifestyle change: none stated     MONITORING & EVALUATION Dietary intake, weekly physical activity, body weight, and pre-op goals reached at next nutrition visit.    Next Steps  Patient is to follow up at [place] for Pre-Op Class >2 weeks before surgery for further nutrition education.

## 2019-05-13 ENCOUNTER — Ambulatory Visit (INDEPENDENT_AMBULATORY_CARE_PROVIDER_SITE_OTHER): Payer: 59 | Admitting: Podiatry

## 2019-05-13 ENCOUNTER — Encounter: Payer: Self-pay | Admitting: Podiatry

## 2019-05-13 ENCOUNTER — Other Ambulatory Visit: Payer: Self-pay

## 2019-05-13 DIAGNOSIS — M722 Plantar fascial fibromatosis: Secondary | ICD-10-CM

## 2019-05-13 DIAGNOSIS — M779 Enthesopathy, unspecified: Secondary | ICD-10-CM | POA: Diagnosis not present

## 2019-05-16 NOTE — Progress Notes (Signed)
Subjective:   Patient ID: Angela Abbott, female   DOB: 59 y.o.   MRN: [id number]   HPI Patient states overall doing well with the left foot but concerned about the continued discomfort in the right and has questions concerning whether something may need to be done   ROS      Objective:  Physical Exam  Neurovascular status intact negative Bevelyn Buckles' sign noted with wound edges well healed left medial lateral heel with excellent reduction of discomfort in the plantar pain with continued pain in the third MPJ right foot     Assessment:  Doing well post endoscopic release of the fascia left with incision sites intact and right foot capsulitis continues to be a problem     Plan:  H&P reviewed both conditions and for the heel remove stitches instructed on stretching continue boot usage as needed and dispensed ankle compression stocking.  For the right foot recommended padding therapy continued orthotic usage rigid bottom shoes and hopefully as her left foot gets better and she offloads it will get better but it is possible osteotomy will be necessary and reviewed that with her

## 2019-05-19 ENCOUNTER — Other Ambulatory Visit: Payer: Self-pay | Admitting: *Deleted

## 2019-05-19 DIAGNOSIS — I6523 Occlusion and stenosis of bilateral carotid arteries: Secondary | ICD-10-CM

## 2019-05-24 ENCOUNTER — Other Ambulatory Visit: Payer: Self-pay

## 2019-05-24 ENCOUNTER — Inpatient Hospital Stay (HOSPITAL_COMMUNITY): Admission: RE | Admit: 2019-05-24 | Payer: 59 | Source: Ambulatory Visit

## 2019-05-24 ENCOUNTER — Encounter: Payer: Self-pay | Admitting: Surgery

## 2019-05-24 ENCOUNTER — Ambulatory Visit: Payer: 59 | Admitting: Surgery

## 2019-05-24 ENCOUNTER — Ambulatory Visit (HOSPITAL_COMMUNITY)
Admission: RE | Admit: 2019-05-24 | Discharge: 2019-05-24 | Disposition: A | Discharge status: Home / Self Care | Payer: 59 | Source: Ambulatory Visit | Attending: Surgery | Admitting: Surgery

## 2019-05-24 VITALS — BP 158/102 | HR 72 | Temp 97.9°F | Resp 20 | Ht 66.0 in | Wt 270.9 lb

## 2019-05-24 DIAGNOSIS — I6523 Occlusion and stenosis of bilateral carotid arteries: Secondary | ICD-10-CM | POA: Diagnosis present

## 2019-05-24 NOTE — Progress Notes (Signed)
[facility] [place]  Patient name: Angela Abbott MRN: [id number] DOB: 08-Aug-1960 Sex: female   REASON FOR VISIT:    Follow up  Angela Abbott:     Angela Abbott a 59 y.o.femalewho is status post right carotid endarterectomy on 11/05/2017. This was done for symptomatic right carotid stenosis. Her symptoms were left arm numbness and facial numbness.  A preoperative MRI revealed multiple embolic infarcts in the right brain. Intraoperative findings included a 75% stenosis and a  low hypoglossal nerve which had to be extensively mobilized in order to perform the operation. She had tongue deviation postoperatively and went to speech therapy.  This has now resolved.  She is medically managed for hypertension.  She takes a statin for hypercholesterolemia.  She is a former smoker.  PAST MEDICAL HISTORY:   Past Medical History:  Diagnosis Date  . Arthritis    shoulders, knees, hips  . Carotid artery occlusion   . Dental crowns present   . GERD (gastroesophageal reflux disease)   . Hypertension   . Hypothyroidism   . Left knee pain 07/2014  . Pneumonia    hx of 02/2017   . Right knee pain   . Stress incontinence   . Stroke [facility])    speech difficulty recalling words  . Wears partial dentures    upper and lower     FAMILY HISTORY:   Family History  Problem Relation Age of Onset  . CVA Other   . Diabetes Other   . CAD Other   . CVA Mother   . Cancer Neg Hx     SOCIAL HISTORY:   Social History   Tobacco Use  . Smoking status: Former Smoker    Packs/day: 1.00    Years: 35.00    Pack years: 35.00    Types: Cigarettes    Quit date: 10/27/2017    Years since quitting: 1.5  . Smokeless tobacco: Never Used  . Tobacco comment: in process of quiting only 1 cigeratte today  Substance Use Topics  . Alcohol use: Yes    Comment: occasionally     ALLERGIES:   Allergies  Allergen  Reactions  . Bee Venom Shortness Of Breath and Swelling  . Hydrocodone Nausea Only     CURRENT MEDICATIONS:   Current Outpatient Medications  Medication Sig Dispense Refill  . aspirin 325 MG tablet Take 1 tablet (325 mg total) by mouth daily. 30 tablet 0  . atorvastatin (LIPITOR) 10 MG tablet Take 10 mg by mouth daily.    . cholecalciferol (VITAMIN D3) 25 MCG (1000 UNIT) tablet Take 2,000 Units by mouth daily.    . citalopram (CELEXA) 10 MG tablet Take 10 mg by mouth daily.    Marland Kitchen diltiazem (CARDIZEM CD) 180 MG 24 hr capsule Take 180 mg by mouth at bedtime.     . gabapentin (NEURONTIN) 300 MG capsule Take 300 mg by mouth at bedtime.    Marland Kitchen levothyroxine (SYNTHROID) 150 MCG tablet Take 150 mcg by mouth every morning.    Marland Kitchen omeprazole (PRILOSEC) 40 MG capsule Take 1 capsule (40 mg total) by mouth daily. 30 capsule 0  . senna-docusate (SENOKOT-S) 8.6-50 MG tablet Take 1 tablet by mouth at bedtime as needed for mild constipation. (Patient taking differently: Take 1 tablet by mouth daily. ) 30 tablet 0   No current facility-administered medications for this visit.    REVIEW OF SYSTEMS:   [X]  denotes positive finding, [ ]  denotes negative  finding Cardiac  Comments:  Chest pain or chest pressure:    Shortness of breath upon exertion:    Short of breath when lying flat:    Irregular heart rhythm:        Vascular    Pain in calf, thigh, or hip brought on by ambulation:    Pain in feet at night that wakes you up from your sleep:     Blood clot in your veins:    Leg swelling:         Pulmonary    Oxygen at home:    Productive cough:     Wheezing:         Neurologic    Sudden weakness in arms or legs:     Sudden numbness in arms or legs:     Sudden onset of difficulty speaking or slurred speech:    Temporary loss of vision in one eye:     Problems with dizziness:         Gastrointestinal    Blood in stool:     Vomited blood:         Genitourinary    Burning when urinating:      Blood in urine:        Psychiatric    Major depression:         Hematologic    Bleeding problems:    Problems with blood clotting too easily:        Skin    Rashes or ulcers:        Constitutional    Fever or chills:      PHYSICAL EXAM:   Vitals:   05/24/19 1355  BP: (!) 163/106  Pulse: 72  Resp: 20  Temp: 97.9 F (36.6 C)  SpO2: 94%  Weight: 270 lb 14.4 oz (122.9 kg)  Height: 5\' 6"  (1.676 m)    GENERAL: The patient is a well-nourished female, in no acute distress. The vital signs are documented above. CARDIAC: There is a regular rate and rhythm.  PULMONARY: Non-labored respirations ABDOMEN: Soft and non-tender with normal pitched bowel sounds.  NEUROLOGIC: No focal weakness or paresthesias are detected. SKIN: There are no ulcers or rashes noted. PSYCHIATRIC: The patient has a normal affect.  STUDIES:   I have reviewed the following: Carotid Duplex: Right Carotid: Velocities in the right ICA are consistent with a 40-59%         stenosis. When compared to prior study there is a decrease  in the         velocities.   Left Carotid: Velocities in the left ICA are consistent with a 40-59%  stenosis.        No change when compared to prior study.   Vertebrals: Bilateral vertebral arteries demonstrate antegrade flow.  Subclavians: Normal flow hemodynamics were seen in bilateral subclavian        arteries.   MEDICAL ISSUES:   Carotid: Stenosis has decreased on the right endarterectomy side based on today's study.  This is likely intimal hyperplasia as it was in the immediate postoperative period.  I will plan on her having a repeat study in 1 year.  From my perspective she is cleared to undergo gastric surgery.    Leia Alf, MD, FACS [facility] [phone] Pager [phone]

## 2019-05-25 ENCOUNTER — Other Ambulatory Visit: Payer: Self-pay | Admitting: *Deleted

## 2019-05-25 DIAGNOSIS — I6523 Occlusion and stenosis of bilateral carotid arteries: Secondary | ICD-10-CM

## 2019-05-26 ENCOUNTER — Telehealth: Payer: Self-pay | Admitting: Podiatry

## 2019-05-26 NOTE — Telephone Encounter (Signed)
Pt called saying her foot is still swollen and she needs two more weeks off from work. Please advise.

## 2019-05-27 ENCOUNTER — Telehealth: Payer: Self-pay | Admitting: Podiatry

## 2019-05-27 NOTE — Telephone Encounter (Signed)
Pt called to see if she can get another note that will give pt up  to  06/14/19 to return shes still having some pain please advise pt will need faxed to employer she stated angela in [place] has handled her paper work

## 2019-05-28 ENCOUNTER — Encounter: Payer: Self-pay | Admitting: Podiatry

## 2019-05-31 NOTE — Telephone Encounter (Signed)
That's fine

## 2019-06-25 ENCOUNTER — Ambulatory Visit (INDEPENDENT_AMBULATORY_CARE_PROVIDER_SITE_OTHER): Payer: 59

## 2019-06-25 ENCOUNTER — Encounter: Payer: Self-pay | Admitting: Podiatry

## 2019-06-25 ENCOUNTER — Other Ambulatory Visit: Payer: Self-pay

## 2019-06-25 ENCOUNTER — Other Ambulatory Visit: Payer: Self-pay | Admitting: Podiatry

## 2019-06-25 ENCOUNTER — Ambulatory Visit: Payer: 59 | Admitting: Podiatry

## 2019-06-25 VITALS — Temp 97.2°F

## 2019-06-25 DIAGNOSIS — M779 Enthesopathy, unspecified: Secondary | ICD-10-CM

## 2019-06-25 DIAGNOSIS — M79671 Pain in right foot: Secondary | ICD-10-CM

## 2019-06-25 NOTE — Progress Notes (Signed)
Subjective:   Patient ID: Angela Abbott, female   DOB: 59 y.o.   MRN: [id number]   HPI Patient presents stating that the right foot has remained persistently sore that she returned to work and it is very tender and that she needs to have it fixed.  We have tried everything we can to get this better   ROS      Objective:  Physical Exam  Neurovascular status intact exquisite discomfort third metatarsal phalangeal joint right inflammation fluid in the joint swelling noted associated with it     Assessment:  What appears to be chronic capsulitis third MPJ right cannot rule out stress fracture or other pathology     Plan:  H&P x-ray reviewed and today I recommended shortening osteotomy third metatarsal right foot.  Explained procedure risk to patient patient wants this done and I actually explained no guarantee this will solve the problem.  Patient is willing to accept risk and at this point she signed consent form after extensive review and is scheduled for outpatient surgery.  Patient is encouraged to call with questions concerns that may occur with this particular problem and understands total recovery can take approximately 6 months  X-rays indicate that there is no signs currently of stress fracture arthritis associated with this condition

## 2019-06-25 NOTE — Patient Instructions (Signed)
Pre-Operative Instructions  Congratulations, you have decided to take an important step towards improving your quality of life.  You can be assured that the doctors and staff at [facility] will be with you every step of the way.  Here are some important things you should know:  1. Plan to be at the surgery center/hospital at least 1 (one) hour prior to your scheduled time, unless otherwise directed by the surgical center/hospital staff.  You must have a responsible adult accompany you, remain during the surgery and drive you home.  Make sure you have directions to the surgical center/hospital to ensure you arrive on time. 2. If you are having surgery at [facility] or [facility], you will need a copy of your medical history and physical form from your family physician within one month prior to the date of surgery. We will give you a form for your primary physician to complete.  3. We make every effort to accommodate the date you request for surgery.  However, there are times where surgery dates or times have to be moved.  We will contact you as soon as possible if a change in schedule is required.   4. No aspirin/ibuprofen for one week before surgery.  If you are on aspirin, any non-steroidal anti-inflammatory medications (Mobic, Aleve, Ibuprofen) should not be taken seven (7) days prior to your surgery.  You make take Tylenol for pain prior to surgery.  5. Medications - If you are taking daily heart and blood pressure medications, seizure, reflux, allergy, asthma, anxiety, pain or diabetes medications, make sure you notify the surgery center/hospital before the day of surgery so they can tell you which medications you should take or avoid the day of surgery. 6. No food or drink after midnight the night before surgery unless directed otherwise by surgical center/hospital staff. 7. No alcoholic beverages 24-hours prior to surgery.  No smoking 24-hours prior or 24-hours after  surgery. 8. Wear loose pants or shorts. They should be loose enough to fit over bandages, boots, and casts. 9. Don't wear slip-on shoes. Sneakers are preferred. 10. Bring your boot with you to the surgery center/hospital.  Also bring crutches or a walker if your physician has prescribed it for you.  If you do not have this equipment, it will be provided for you after surgery. 11. If you have not been contacted by the surgery center/hospital by the day before your surgery, call to confirm the date and time of your surgery. 12. Leave-time from work may vary depending on the type of surgery you have.  Appropriate arrangements should be made prior to surgery with your employer. 13. Prescriptions will be provided immediately following surgery by your doctor.  Fill these as soon as possible after surgery and take the medication as directed. Pain medications will not be refilled on weekends and must be approved by the doctor. 14. Remove nail polish on the operative foot and avoid getting pedicures prior to surgery. 15. Wash the night before surgery.  The night before surgery wash the foot and leg well with water and the antibacterial soap provided. Be sure to pay special attention to beneath the toenails and in between the toes.  Wash for at least three (3) minutes. Rinse thoroughly with water and dry well with a towel.  Perform this wash unless told not to do so by your physician.  Enclosed: 1 Ice pack (please put in freezer the night before surgery)   1 Hibiclens skin cleaner     Pre-op instructions  If you have any questions regarding the instructions, please do not hesitate to call our office.  [place]: [address], [place], [place] [address] -- [phone]  [place]: [address] , [place] [address] -- [phone]  [place]: [address], , [place] [address] -- [phone]   Website: [email or web address] 

## 2019-06-30 ENCOUNTER — Telehealth: Payer: Self-pay

## 2019-06-30 NOTE — Telephone Encounter (Signed)
DOS 07/13/19  METATARSAL OSTEOTOMY 3RD RT - 28308  [facility] EFFECTIVE DATE - 01/08/2019  PLAN DEDUCTIBLE - $0.00 OUT OF POCKET - $500.00 W/$68.00 REMAINING COPAY $125.00 COINSURANCE - 0%   Requested Services: 28308:  INCISION TO STRAIGHTEN TOE BONE (OTHER THAN THE BIG TOE) AT THE MIDFOOT BONE (METATARSAL) LEVEL Status: CERTIFIED  Servicing Provider:REGAL, [place]; SLP:[id number] [place], [place] [phone]  Status: APPROVED  Authorization #: [id number] Jul 13, 2019 - Oct 11, 2019

## 2019-07-09 ENCOUNTER — Ambulatory Visit: Payer: 59 | Admitting: Podiatry

## 2019-07-09 MED ORDER — HYDROCODONE-ACETAMINOPHEN 10-325 MG PO TABS: 1.0000 | ORAL_TABLET | Freq: Four times a day (QID) | ORAL | 0 refills | Status: AC | PRN

## 2019-07-09 NOTE — Addendum Note (Signed)
Addended by: Wallene Huh on: 07/09/2019 01:05 PM   Modules accepted: Orders

## 2019-07-13 ENCOUNTER — Encounter: Payer: Self-pay | Admitting: Podiatry

## 2019-07-13 DIAGNOSIS — M21541 Acquired clubfoot, right foot: Secondary | ICD-10-CM

## 2019-07-14 ENCOUNTER — Ambulatory Visit: Payer: 59 | Admitting: Podiatry

## 2019-07-19 ENCOUNTER — Telehealth: Payer: Self-pay | Admitting: Podiatry

## 2019-07-19 NOTE — Telephone Encounter (Signed)
I have a question about my bill. My guarantor number is [id number]. You can reach me at [phone]. Thank you.

## 2019-07-21 ENCOUNTER — Other Ambulatory Visit: Payer: Self-pay | Admitting: Podiatry

## 2019-07-21 ENCOUNTER — Encounter: Payer: Self-pay | Admitting: Podiatry

## 2019-07-21 ENCOUNTER — Ambulatory Visit (INDEPENDENT_AMBULATORY_CARE_PROVIDER_SITE_OTHER): Payer: 59

## 2019-07-21 ENCOUNTER — Ambulatory Visit (INDEPENDENT_AMBULATORY_CARE_PROVIDER_SITE_OTHER): Payer: 59 | Admitting: Podiatry

## 2019-07-21 ENCOUNTER — Other Ambulatory Visit: Payer: Self-pay

## 2019-07-21 DIAGNOSIS — M779 Enthesopathy, unspecified: Secondary | ICD-10-CM | POA: Diagnosis not present

## 2019-07-21 DIAGNOSIS — M79671 Pain in right foot: Secondary | ICD-10-CM

## 2019-07-22 NOTE — Progress Notes (Signed)
Subjective:   Patient ID: Angela Abbott, female   DOB: 59 y.o.   MRN: [id number]   HPI Patient presents stating of doing real well with minimal discomfort and walking in my boot as I was instructed   ROS      Objective:  Physical Exam  Neurovascular status intact negative Bevelyn Buckles' sign noted with patient's right foot healing well wound edges well coapted with good alignment noted and minimal edema     Assessment:  Doing well post osteotomy third metatarsal right     Plan:  H&P reviewed condition and results and at this point I recommended continued immobilization elevation compression and dispensed Ace wrap surgical shoe.  Reappoint for [place] to recheck again in 3 weeks or earlier if needed  X-rays indicate the osteotomy is healing well fixation in place good alignment noted

## 2019-07-26 ENCOUNTER — Other Ambulatory Visit: Payer: Self-pay

## 2019-07-26 ENCOUNTER — Encounter: Payer: 59 | Attending: General Surgery | Admitting: Skilled Nursing Facility1

## 2019-07-26 DIAGNOSIS — E669 Obesity, unspecified: Secondary | ICD-10-CM | POA: Insufficient documentation

## 2019-07-26 NOTE — Progress Notes (Signed)
Pre-Operative Nutrition Class:  Appt start time: 7471   End time:  1830.  Patient was seen on 07/26/2019 for Pre-Operative Bariatric Surgery Education at [facility].    Surgery date:  Surgery type: RYGB Start weight at [facility]: 265 Weight today: 276.2   The following the learning objectives were met by the patient during this course:  Identify Pre-Op Dietary Goals and will begin 2 weeks pre-operatively  Identify appropriate sources of fluids and proteins   State protein recommendations and appropriate sources pre and post-operatively  Identify Post-Operative Dietary Goals and will follow for 2 weeks post-operatively  Identify appropriate multivitamin and calcium sources  Describe the need for physical activity post-operatively and will follow MD recommendations  State when to call healthcare provider regarding medication questions or post-operative complications  Handouts given during class include:  Pre-Op Bariatric Surgery Diet Handout  Protein Shake Handout  Post-Op Bariatric Surgery Nutrition Handout  BELT Program Information Flyer  Support Group Information Flyer  [facility] Bariatric Supplements Price List  Follow-Up Plan: Patient will follow-up at [facility] 2 weeks post operatively for diet advancement per MD.

## 2019-08-11 ENCOUNTER — Other Ambulatory Visit: Payer: Self-pay

## 2019-08-11 ENCOUNTER — Ambulatory Visit (INDEPENDENT_AMBULATORY_CARE_PROVIDER_SITE_OTHER): Payer: 59 | Admitting: Podiatry

## 2019-08-11 ENCOUNTER — Ambulatory Visit (INDEPENDENT_AMBULATORY_CARE_PROVIDER_SITE_OTHER): Payer: 59

## 2019-08-11 ENCOUNTER — Encounter: Payer: Self-pay | Admitting: Podiatry

## 2019-08-11 VITALS — Temp 97.2°F

## 2019-08-11 DIAGNOSIS — M779 Enthesopathy, unspecified: Secondary | ICD-10-CM

## 2019-08-11 NOTE — Progress Notes (Signed)
Subjective:   Patient ID: Angela Abbott, female   DOB: 59 y.o.   MRN: [id number]   HPI Patient presents stating overall I am doing pretty well I still have some soreness if I walk on it a lot   ROS      Objective:  Physical Exam  Neurovascular status intact negative Bevelyn Buckles' sign noted right foot healing well wound edges well coapted mild forefoot edema     Assessment:  Doing well post osteotomy third metatarsal right     Plan:  Sterile dressing reapplied continue with reduced activity and may gradually increase activity along with elevation to be continued.  Should get better completely we will see back 5 weeks or earlier if needed  X-rays indicate osteotomies healing well fixation in place

## 2019-08-17 ENCOUNTER — Encounter: Payer: Self-pay | Admitting: Podiatry

## 2019-08-30 ENCOUNTER — Ambulatory Visit (INDEPENDENT_AMBULATORY_CARE_PROVIDER_SITE_OTHER): Payer: 59 | Admitting: Podiatry

## 2019-08-30 ENCOUNTER — Ambulatory Visit (INDEPENDENT_AMBULATORY_CARE_PROVIDER_SITE_OTHER): Payer: 59

## 2019-08-30 ENCOUNTER — Encounter: Payer: Self-pay | Admitting: Podiatry

## 2019-08-30 ENCOUNTER — Other Ambulatory Visit: Payer: Self-pay

## 2019-08-30 VITALS — Temp 96.4°F

## 2019-08-30 DIAGNOSIS — M779 Enthesopathy, unspecified: Secondary | ICD-10-CM

## 2019-08-30 DIAGNOSIS — D3610 Benign neoplasm of peripheral nerves and autonomic nervous system, unspecified: Secondary | ICD-10-CM

## 2019-08-30 MED ORDER — DICLOFENAC SODIUM 75 MG PO TBEC
75.0000 mg | DELAYED_RELEASE_TABLET | Freq: Two times a day (BID) | ORAL | 2 refills | Status: DC
Start: 2019-08-30 — End: 2020-03-24

## 2019-08-30 NOTE — Progress Notes (Signed)
Subjective:   Patient ID: Angela Abbott, female   DOB: 59 y.o.   MRN: [id number]   HPI Patient presents stating she is still having pain with her right foot and appears to be more prolonging I will than it is direct pressure.  States that she is walking her dog 45 minutes a day and still getting quite a bit of swelling in her forefoot and is wanting to get this checked   ROS      Objective:  Physical Exam  Neurovascular status intact with what appears to be reduced inflammation around the third MPJ with incision site dorsal healing very well wound edges well coapted.  Left heel is doing very well after surgery and right third interspace has mild to moderate discomfort with palpation     Assessment:  Appears to be doing well from shortening osteotomy third right with normal healing clinically with inflammation around the interspace     Plan:  H&P reviewed condition and at this point I did discuss possibility for neuroma symptomatology educated her on this.  I did dispense a metatarsal pad I am placing her on an anti-inflammatory and I reviewed her x-ray.  Reappoint 3 weeks to reevaluate and we will keep her out of work and hopefully at that point the nerve symptoms will be under control even though I may add gabapentin his medication depending on response  X-ray indicates that the screw is in place and the metatarsal is healing well with no indication of secondary bone healing

## 2019-09-16 ENCOUNTER — Encounter: Payer: 59 | Admitting: Podiatry

## 2019-09-20 ENCOUNTER — Encounter: Payer: Self-pay | Admitting: Podiatry

## 2019-09-20 ENCOUNTER — Other Ambulatory Visit: Payer: Self-pay

## 2019-09-20 ENCOUNTER — Ambulatory Visit: Payer: 59 | Admitting: Podiatry

## 2019-09-20 DIAGNOSIS — D3610 Benign neoplasm of peripheral nerves and autonomic nervous system, unspecified: Secondary | ICD-10-CM | POA: Diagnosis not present

## 2019-09-20 NOTE — Progress Notes (Signed)
Subjective:   Patient ID: Angela Abbott, female   DOB: 59 y.o.   MRN: [id number]   HPI Patient presents stating that she seems to be doing okay with swelling but feels like she is getting some burning shooting pain between the second and third toes   ROS      Objective:  Physical Exam  Neurovascular status intact with what appears to be possible low to mid grade nerve irritation second interspace right foot with well-healed surgical site third metatarsal     Assessment:  Possibility that were dealing with an inflammatory condition versus a nerve compression entrapment condition     Plan:  H&P redispense padding to take pressure off the underlying metatarsal region and went ahead today did sterile prep and injected the second interspace with a purified alcohol Marcaine solution to try to reduce any nerve irritation.  Patient will be seen back for [place] to recheck

## 2019-09-28 ENCOUNTER — Encounter: Payer: Self-pay | Admitting: Cardiology

## 2019-09-28 ENCOUNTER — Other Ambulatory Visit: Payer: Self-pay

## 2019-09-28 ENCOUNTER — Ambulatory Visit: Payer: 59 | Admitting: Cardiology

## 2019-09-28 VITALS — BP 122/76 | HR 68 | Temp 97.0°F | Ht 66.0 in | Wt 279.2 lb

## 2019-09-28 DIAGNOSIS — E7800 Pure hypercholesterolemia, unspecified: Secondary | ICD-10-CM

## 2019-09-28 DIAGNOSIS — Z6841 Body Mass Index (BMI) 40.0 and over, adult: Secondary | ICD-10-CM

## 2019-09-28 DIAGNOSIS — Z01810 Encounter for preprocedural cardiovascular examination: Secondary | ICD-10-CM | POA: Diagnosis not present

## 2019-09-28 DIAGNOSIS — Z8673 Personal history of transient ischemic attack (TIA), and cerebral infarction without residual deficits: Secondary | ICD-10-CM | POA: Diagnosis not present

## 2019-09-28 DIAGNOSIS — E66813 Obesity, class 3: Secondary | ICD-10-CM

## 2019-09-28 DIAGNOSIS — Z7189 Other specified counseling: Secondary | ICD-10-CM

## 2019-09-28 DIAGNOSIS — I10 Essential (primary) hypertension: Secondary | ICD-10-CM

## 2019-09-28 DIAGNOSIS — Z01818 Encounter for other preprocedural examination: Secondary | ICD-10-CM

## 2019-09-28 NOTE — Progress Notes (Signed)
Cardiology Office Note:    Date:  09/28/2019   ID:  Angela Abbott, DOB [birth year], MRN [id number]  PCP:  Sharilyn Sites, MD  Cardiologist:  Buford Dresser, MD  Referring MD: [name], MD   CC: new patient consultation for preoperative evaluation  History of Present Illness:    Angela Abbott is a 59 y.o. female with a hx of hypertension, TIA/CVA with prior right CEA  who is seen as a new consult at the request of [name], MD for the evaluation and management of preoperative cardiovascular evaluation.  Planned surgery: bariatric surgery (prior lap band 2010, removed 2/2 reflux/dysphagia. Per note, discussed Roux-en-Y gastric bypass. No date yet, follows with Dr. [name].  Pertinent past cardiac history: no prior cardiac disease known.  Prior cardiac workup: echo in 2019 History of valve disease: none History of CAD/PAD/CVA/TIA: Has history of R CEA 2019 for carotid disease. Follows with Dr. Trula Slade, last carotid dopplers 05/24/19 reviewed (bilateral 40-59% stenosis). Noted to have prior embolic infarcts in the right brain on MRI History of heart failure: none History of arrhythmia: none On anticoagulation: only aspirin 325 mg, defer to neurology/vascular surgery on dose. No significant bleeding issues History of hypertension: yes, on diltiazem History of diabetes: no History of CKD: denies History of OSA: denies (has been tested) History of anesthesia complications: none Current symptoms: Denies chest pain, shortness of breath at rest or with normal exertion. No PND, orthopnea, LE edema or unexpected weight gain. No syncope or palpitations. Functional capacity: can walk, climb stairs without limitations. Has not attempted to run, etc given recent foot surgery.  Quit smoking in 2019, was a smoker for >40 years.  Past Medical History:  Diagnosis Date  . Arthritis    shoulders, knees, hips  . Carotid artery occlusion   . Dental crowns present   . GERD  (gastroesophageal reflux disease)   . Hypertension   . Hypothyroidism   . Left knee pain 07/2014  . Pneumonia    hx of 02/2017   . Right knee pain   . Stress incontinence   . Stroke [facility])    speech difficulty recalling words  . Wears partial dentures    upper and lower    Past Surgical History:  Procedure Laterality Date  . ABDOMINAL HYSTERECTOMY     partial  . CAROTID ENDARTERECTOMY    . [place]  . ENDARTERECTOMY Right 11/05/2017   Procedure: ENDARTERECTOMY CAROTID RIGHT;  Surgeon: [name], MD;  Location: [facility] OR;  Service: Vascular;  Laterality: Right;  . ESOPHAGOGASTRODUODENOSCOPY N/A 11/26/2012   Procedure: ESOPHAGOGASTRODUODENOSCOPY (EGD);  Surgeon: Shann Medal, MD;  Location: [facility];  Service: General;  Laterality: N/A;  . GANGLION CYST EXCISION Right 02/2008   foot  . KNEE ARTHROSCOPY WITH LATERAL MENISECTOMY Left 07/21/2014   Procedure: LEFT KNEE ARTHROSCOPY,  CHONDROPLASTY, WITH LATERAL AND MEDIAL  MENISCECTOMIES;  Surgeon: Kathryne Hitch, MD;  Location: [facility];  Service: Orthopedics;  Laterality: Left;  . KNEE ARTHROSCOPY WITH MEDIAL MENISECTOMY Left 07/21/2014   Procedure: KNEE ARTHROSCOPY WITH MEDIAL MENISECTOMY;  Surgeon: Kathryne Hitch, MD;  Location: [facility];  Service: Orthopedics;  Laterality: Left;  . LAPAROSCOPIC GASTRIC BANDING  02/18/2008  . LAPAROSCOPIC REPAIR AND REMOVAL OF GASTRIC BAND N/A 03/21/2017   Procedure: LAPAROSCOPIC REPAIR AND REMOVAL OF GASTRIC BAND;  Surgeon: [name], MD;  Location: [facility];  Service: General;  Laterality: N/A;  . PARTIAL HYSTERECTOMY  09/07/2002  .  PATCH ANGIOPLASTY Right 11/05/2017   Procedure: PATCH ANGIOPLASTY of right carotid artery using xenosure bovine pericardium patch;  Surgeon: [name], MD;  Location: [facility] OR;  Service: Vascular;  Laterality: Right;  . SHOULDER ARTHROSCOPY WITH SUBACROMIAL DECOMPRESSION, ROTATOR CUFF REPAIR AND BICEP TENDON  REPAIR Right 11/23/2015   Procedure: RIGHT SHOULDER ARTHROSCOPY WITH DEBRIDEMENT, SUBACROMIAL DECOMPRESSION, DISTAL CLAVICLE EXCISION, ROTATOR CUFF REPAIR AND BICEP TENODESIS;  Surgeon: Ninetta Lights, MD;  Location: [facility];  Service: Orthopedics;  Laterality: Right;    Current Medications: Current Outpatient Medications on File Prior to Visit  Medication Sig  . aspirin 325 MG tablet Take 1 tablet (325 mg total) by mouth daily.  Marland Kitchen atorvastatin (LIPITOR) 10 MG tablet Take 10 mg by mouth daily.  . cholecalciferol (VITAMIN D3) 25 MCG (1000 UNIT) tablet Take 2,000 Units by mouth daily.  . citalopram (CELEXA) 10 MG tablet Take 10 mg by mouth daily.  . diclofenac (VOLTAREN) 75 MG EC tablet Take 1 tablet (75 mg total) by mouth 2 (two) times daily.  Marland Kitchen diltiazem (CARDIZEM CD) 180 MG 24 hr capsule Take 180 mg by mouth at bedtime.   . gabapentin (NEURONTIN) 300 MG capsule Take 300 mg by mouth at bedtime.  Marland Kitchen levothyroxine (SYNTHROID) 150 MCG tablet Take 150 mcg by mouth every morning.  Marland Kitchen omeprazole (PRILOSEC) 40 MG capsule Take 1 capsule (40 mg total) by mouth daily.  Marland Kitchen senna-docusate (SENOKOT-S) 8.6-50 MG tablet Take 1 tablet by mouth at bedtime as needed for mild constipation. (Patient taking differently: Take 1 tablet by mouth daily. )   No current facility-administered medications on file prior to visit.     Allergies:   Bee venom and Hydrocodone   Social History   Tobacco Use  . Smoking status: Former Smoker    Packs/day: 1.00    Years: 35.00    Pack years: 35.00    Types: Cigarettes    Quit date: 10/27/2017    Years since quitting: 1.9  . Smokeless tobacco: Never Used  . Tobacco comment: in process of quiting only 1 cigeratte today  Vaping Use  . Vaping Use: Former  Substance Use Topics  . Alcohol use: Yes    Comment: occasionally  . Drug use: No    Family History: family history includes CAD in an other family member; CVA in her mother and another family  member; Diabetes in an other family member. There is no history of Cancer.  ROS:   Please see the history of present illness.  Additional pertinent ROS: Constitutional: Negative for chills, fever, night sweats, unintentional weight loss  HENT: Negative for ear pain and hearing loss.   Eyes: Negative for loss of vision and eye pain.  Respiratory: Negative for cough, sputum, wheezing.   Cardiovascular: See HPI. Gastrointestinal: Negative for abdominal pain, melena, and hematochezia.  Genitourinary: Negative for dysuria and hematuria.  Musculoskeletal: Negative for falls and myalgias.  Skin: Negative for itching and rash.  Neurological: Negative for focal weakness, focal sensory changes and loss of consciousness.  Endo/Heme/Allergies: Does bruise/bleed easily.     EKGs/Labs/Other Studies Reviewed:    The following studies were reviewed today: Echo 10/28/17 - Left ventricle: The cavity size was normal. There was mild  concentric hypertrophy. Systolic function was normal. The  estimated ejection fraction was in the range of 60% to 65%. Wall  motion was normal; there were no regional wall motion  abnormalities. Doppler parameters are consistent with abnormal  left ventricular relaxation (grade 1  diastolic dysfunction).  - Left atrium: The atrium was mildly dilated.   EKG:  EKG is personally reviewed.  The ekg ordered today demonstrates NSR 68 bpm  Recent Labs: 01/31/2019: ALT 21; BUN 19; Creatinine, Ser 1.00; Hemoglobin 15.0; Platelets 230; Potassium 4.3; Sodium 138  Recent Lipid Panel    Component Value Date/Time   CHOL 44 10/28/2017 0437   TRIG 32 10/28/2017 0437   HDL 18 (L) 10/28/2017 0437   CHOLHDL 2.4 10/28/2017 0437   VLDL 6 10/28/2017 0437   LDLCALC 20 10/28/2017 0437    Physical Exam:    VS:  BP 122/76   Pulse 68   Temp (!) 97 F (36.1 C)   Ht 5\' 6"  (1.676 m)   Wt 279 lb 3.2 oz (126.6 kg)   SpO2 94%   BMI 45.06 kg/m     Wt Readings from Last 3  Encounters:  09/28/19 279 lb 3.2 oz (126.6 kg)  07/26/19 276 lb 3.2 oz (125.3 kg)  05/24/19 270 lb 14.4 oz (122.9 kg)    GEN: Well nourished, well developed in no acute distress HEENT: Normal, moist mucous membranes NECK: No JVD CARDIAC: regular rhythm, normal S1 and S2, no rubs or gallops. No murmurs. VASCULAR: Radial and DP pulses 2+ bilaterally. No carotid bruits RESPIRATORY:  Clear to auscultation without rales, wheezing or rhonchi  ABDOMEN: Soft, non-tender, non-distended MUSCULOSKELETAL:  Ambulates independently SKIN: Warm and dry, no edema NEUROLOGIC:  Alert and oriented x 3. No focal neuro deficits noted. PSYCHIATRIC:  Normal affect    ASSESSMENT:    1. Pre-op evaluation   2. Essential hypertension   3. Hypercholesterolemia   4. History of CVA (cerebrovascular accident)   5. Class 3 severe obesity due to excess calories with serious comorbidity and body mass index (BMI) of 45.0 to 49.9 in adult [facility])   6. Cardiac risk counseling   7. Counseling on health promotion and disease prevention    PLAN:    Preoperative cardiovascular risk: Based on available date, patient's RCRI score = 0, which carries a 3.9% 30-day risk of death, MI, or cardiac arrest.  The patient is not currently having active cardiac symptoms, and they can achieve >4 METs of activity.  According to ACC/AHA Guidelines, no further testing is needed.  Proceed with surgery at acceptable risk.  Our service is available as needed in the peri-operative period.    Hypertension: -at goal today, continue diltiazem. She is hopeful she is going be able to come off of this entirely post op.  Morbid obesity: BMI 78, planned for bariatric surgery  Prior CVA/TIA, with history of carotid stenosis s/p R CEA -on aspirin, defer dose to Dr. Trula Slade -continue atorvastatin, LD goal <70, recheck post surgical recovery  Cardiac risk counseling and prevention recommendations: -recommend heart healthy/Mediterranean diet, with  whole grains, fruits, vegetable, fish, lean meats, nuts, and olive oil. Limit salt. -recommend moderate walking, 3-5 times/week for 30-50 minutes each session. Aim for at least 150 minutes.week. Goal should be pace of 3 miles/hours, or walking 1.5 miles in 30 minutes -recommend avoidance of tobacco products. Avoid excess alcohol.  Plan for follow up: 6 mos, then likely every two years for risk factor management.  Buford Dresser, MD, PhD [place]  [facility]    Medication Adjustments/Labs and Tests Ordered: Current medicines are reviewed at length with the patient today.  Concerns regarding medicines are outlined above.  Orders Placed This Encounter  Procedures  . EKG 12-Lead   No orders of  the defined types were placed in this encounter.   Patient Instructions  Medication Instructions:  Your Physician recommend you continue on your current medication as directed.    *If you need a refill on your cardiac medications before your next appointment, please call your pharmacy*   Lab Work: None ordered   Testing/Procedures: None ordered    Follow-Up: At [facility], you and your health needs are our priority.  As part of our continuing mission to provide you with exceptional heart care, we have created designated Provider Care Teams.  These Care Teams include your primary Cardiologist (physician) and Advanced Practice Providers (APPs -  Physician Assistants and Nurse Practitioners) who all work together to provide you with the care you need, when you need it.  We recommend signing up for the patient portal called "MyChart".  Sign up information is provided on this After Visit Summary.  MyChart is used to connect with patients for Virtual Visits (Telemedicine).  Patients are able to view lab/test results, encounter notes, upcoming appointments, etc.  Non-urgent messages can be sent to your provider as well.   To learn more about what you can do with MyChart, go to  [email or web address].    Your next appointment:   6 month(s)  The format for your next appointment:   In Person  Provider:   Buford Dresser, MD       Signed, Buford Dresser, MD PhD 09/28/2019  [place]

## 2019-09-28 NOTE — Patient Instructions (Signed)

## 2019-09-30 ENCOUNTER — Encounter: Payer: Self-pay | Admitting: Podiatry

## 2019-10-01 ENCOUNTER — Ambulatory Visit: Payer: 59 | Admitting: Podiatry

## 2019-10-06 ENCOUNTER — Encounter: Payer: Self-pay | Admitting: Podiatry

## 2019-10-06 ENCOUNTER — Other Ambulatory Visit: Payer: Self-pay

## 2019-10-06 ENCOUNTER — Ambulatory Visit (INDEPENDENT_AMBULATORY_CARE_PROVIDER_SITE_OTHER): Payer: 59 | Admitting: Podiatry

## 2019-10-06 DIAGNOSIS — M779 Enthesopathy, unspecified: Secondary | ICD-10-CM

## 2019-10-06 DIAGNOSIS — M7751 Other enthesopathy of right foot: Secondary | ICD-10-CM | POA: Diagnosis not present

## 2019-10-06 NOTE — Progress Notes (Signed)
Subjective:   Patient ID: Angela Abbott, female   DOB: 59 y.o.   MRN: [id number]   HPI Patient states overall doing well but the pain is present in the joint next to it and it is quite inflamed   ROS      Objective:  Physical Exam  Neurovascular status intact with no current neuroma-like symptomatology noted but I did note quite a bit of inflammation around the second MPJ right with the third doing well that we did surgery     Assessment:  Inflammatory capsulitis of the second MPJ right with history of shortening osteotomy third right     Plan:  Sterile prep done and injected the second MPJ 3 mg Dexasone Kenalog 5 mg Xylocaine advised on rigid bottom shoes and hopeful return to work in the next 2 to 3 weeks

## 2019-10-28 ENCOUNTER — Ambulatory Visit (HOSPITAL_COMMUNITY)
Admission: RE | Admit: 2019-10-28 | Discharge: 2019-10-28 | Disposition: A | Discharge status: Home / Self Care | Payer: 59 | Source: Ambulatory Visit | Attending: Physician Assistant | Admitting: Physician Assistant

## 2019-10-28 ENCOUNTER — Other Ambulatory Visit (HOSPITAL_COMMUNITY): Payer: Self-pay | Admitting: Physician Assistant

## 2019-10-28 ENCOUNTER — Other Ambulatory Visit: Payer: Self-pay

## 2019-10-28 DIAGNOSIS — M546 Pain in thoracic spine: Secondary | ICD-10-CM | POA: Diagnosis present

## 2019-11-03 ENCOUNTER — Telehealth: Payer: Self-pay | Admitting: Dietician

## 2019-11-03 NOTE — Telephone Encounter (Signed)
Attempted to call patient for a Pre-Op nutrition update and to assess patient's understanding. Patient previously received nutrition education at the Pre-Op Class on 07/26/2019. Surgery date is not scheduled at this time.   LVM   Nat Christen [facility]) , MS, RD, LDN

## 2019-11-14 ENCOUNTER — Encounter: Payer: Self-pay | Admitting: Cardiology

## 2019-11-14 DIAGNOSIS — Z8673 Personal history of transient ischemic attack (TIA), and cerebral infarction without residual deficits: Secondary | ICD-10-CM | POA: Insufficient documentation

## 2019-11-16 ENCOUNTER — Telehealth: Payer: Self-pay | Admitting: Skilled Nursing Facility1

## 2019-11-16 NOTE — Telephone Encounter (Signed)
Dietitian called pt to assess their understanding of the pre-op nutrition recommendations through the teach back method to ensure the pts knowledge readiness in preparation for surgery.   Pt had multiple questions about the 2 weeks post op diet.  Pt believed if she ate too much protein such as 70g her body would just flush it out such as vitamin C: Dietitian educated pt on this topic.

## 2019-12-16 ENCOUNTER — Ambulatory Visit: Payer: 59 | Admitting: Podiatry

## 2019-12-16 ENCOUNTER — Ambulatory Visit (INDEPENDENT_AMBULATORY_CARE_PROVIDER_SITE_OTHER): Payer: 59

## 2019-12-16 ENCOUNTER — Encounter: Payer: Self-pay | Admitting: Podiatry

## 2019-12-16 ENCOUNTER — Other Ambulatory Visit: Payer: Self-pay

## 2019-12-16 DIAGNOSIS — M7672 Peroneal tendinitis, left leg: Secondary | ICD-10-CM | POA: Diagnosis not present

## 2019-12-16 DIAGNOSIS — M79672 Pain in left foot: Secondary | ICD-10-CM | POA: Diagnosis not present

## 2019-12-16 DIAGNOSIS — M722 Plantar fascial fibromatosis: Secondary | ICD-10-CM

## 2019-12-16 MED ORDER — TRIAMCINOLONE ACETONIDE 10 MG/ML IJ SUSP
10.0000 mg | Freq: Once | INTRAMUSCULAR | Status: AC
  Administered 2019-12-16: 10 mg

## 2019-12-18 NOTE — Progress Notes (Signed)
Subjective:   Patient ID: Angela Abbott, female   DOB: 59 y.o.   MRN: [id number]   HPI Patient states her right foot is improving but she is getting some pain in the outside of her left foot that is sore   ROS      Objective:  Physical Exam  Neurovascular status intact with patient's left lateral foot becoming inflamed around the perineal incision     Assessment:  Possibility that there is peroneal tendinitis at the insertion left     Plan:  H&P reviewed condition sterile prep and injected the peroneal insertion 3 mg Kenalog 5 mg Xylocaine advised ice therapy and reappoint to recheck as needed  X-rays were negative for signs of fracture or bony contusion or arthritis in this area

## 2019-12-20 ENCOUNTER — Other Ambulatory Visit: Payer: Self-pay | Admitting: Podiatry

## 2019-12-20 DIAGNOSIS — M722 Plantar fascial fibromatosis: Secondary | ICD-10-CM

## 2019-12-27 ENCOUNTER — Ambulatory Visit (INDEPENDENT_AMBULATORY_CARE_PROVIDER_SITE_OTHER): Payer: 59

## 2019-12-27 ENCOUNTER — Other Ambulatory Visit: Payer: Self-pay

## 2019-12-27 ENCOUNTER — Ambulatory Visit: Payer: 59 | Admitting: Podiatry

## 2019-12-27 ENCOUNTER — Encounter: Payer: Self-pay | Admitting: Podiatry

## 2019-12-27 DIAGNOSIS — M79672 Pain in left foot: Secondary | ICD-10-CM | POA: Diagnosis not present

## 2019-12-27 DIAGNOSIS — M7662 Achilles tendinitis, left leg: Secondary | ICD-10-CM

## 2019-12-27 MED ORDER — TRIAMCINOLONE ACETONIDE 10 MG/ML IJ SUSP
10.0000 mg | Freq: Once | INTRAMUSCULAR | Status: AC
  Administered 2019-12-27: 10 mg

## 2019-12-27 NOTE — Progress Notes (Signed)
Subjective:   Patient ID: Angela Abbott, female   DOB: 59 y.o.   MRN: [id number]   HPI Patient presents stating most of her pain has gone away but she has developed a new acute pain in the back of the left heel and states that her boot has completely fallen apart due to previous wearing and needs a new boot.  States it is very tender   ROS      Objective:  Physical Exam  Neurovascular status intact with an area of inflammation posterior aspect of the left Achilles insertion calcaneus lateral side with no central or medial involvement and no indications of muscle strength loss at the current time     Assessment:  Appears to be acute inflammatory Achilles tendinitis left with obesity is complicating factor with no current lateral pain heel pain noted plantar     Plan:  Reviewed condition and x-ray and at this point I recommended a careful injection to this lateral side explaining chances for rupture and she wants injection along with immobilization.  I did a sterile prep of the lateral side I carefully injected not putting it directly into the center or medial portion of the tendon 3 mg dexamethasone Kenalog 5 mg Xylocaine and air fracture walker dispensed with all instructions on usage and we did use a tall air fracture walker at the current time  X-ray indicates everything seems to be healing well with minimal posterior spur formation noted

## 2019-12-29 ENCOUNTER — Other Ambulatory Visit: Payer: Self-pay | Admitting: Podiatry

## 2019-12-29 DIAGNOSIS — M7662 Achilles tendinitis, left leg: Secondary | ICD-10-CM

## 2020-01-05 IMAGING — DX DG CHEST 2V
2 series · 2 of 2 positions shown · non-contrast
Comparison: 03/14/2017

CLINICAL DATA: CVA

EXAM:
CHEST - 2 VIEW

[chest pa]
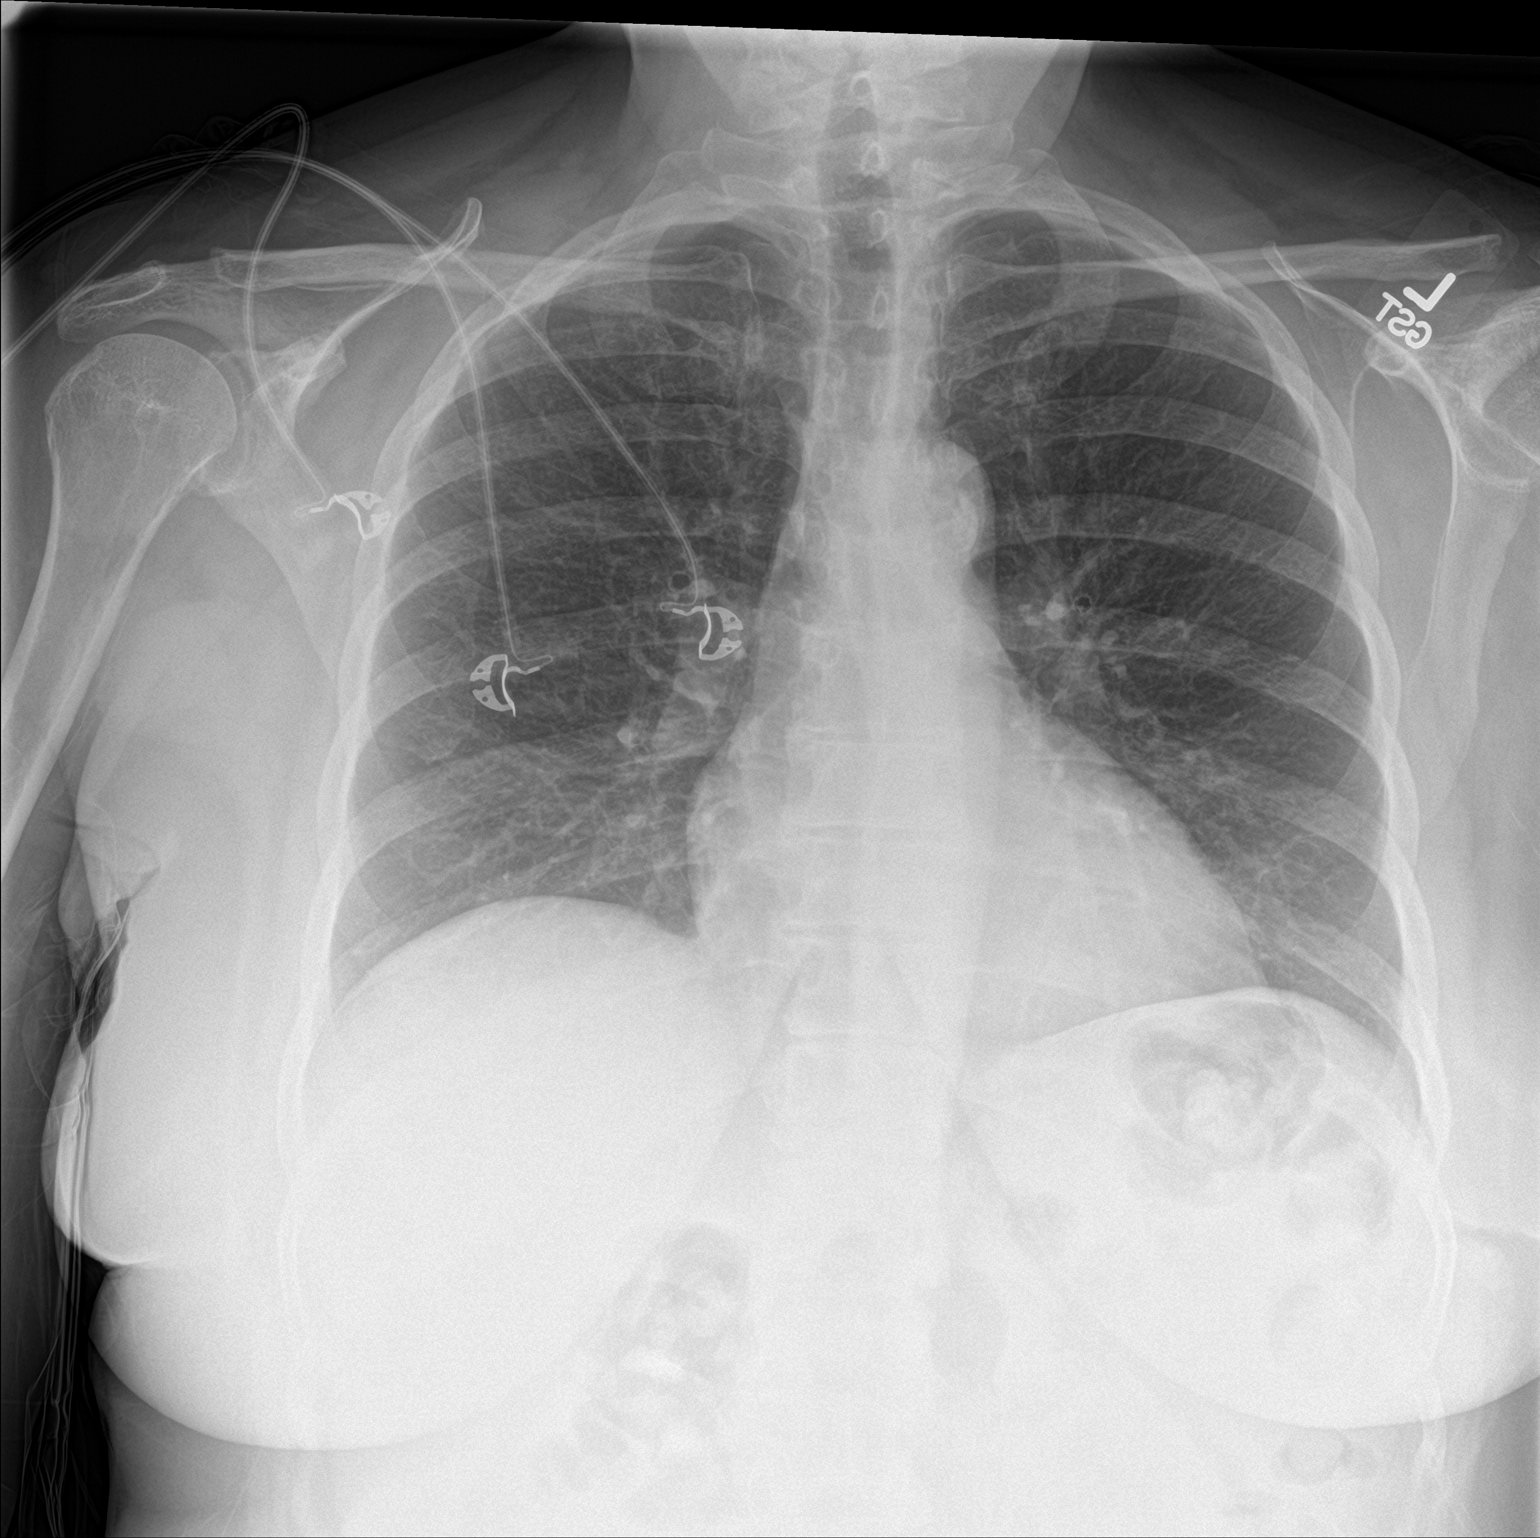

[chest lat]
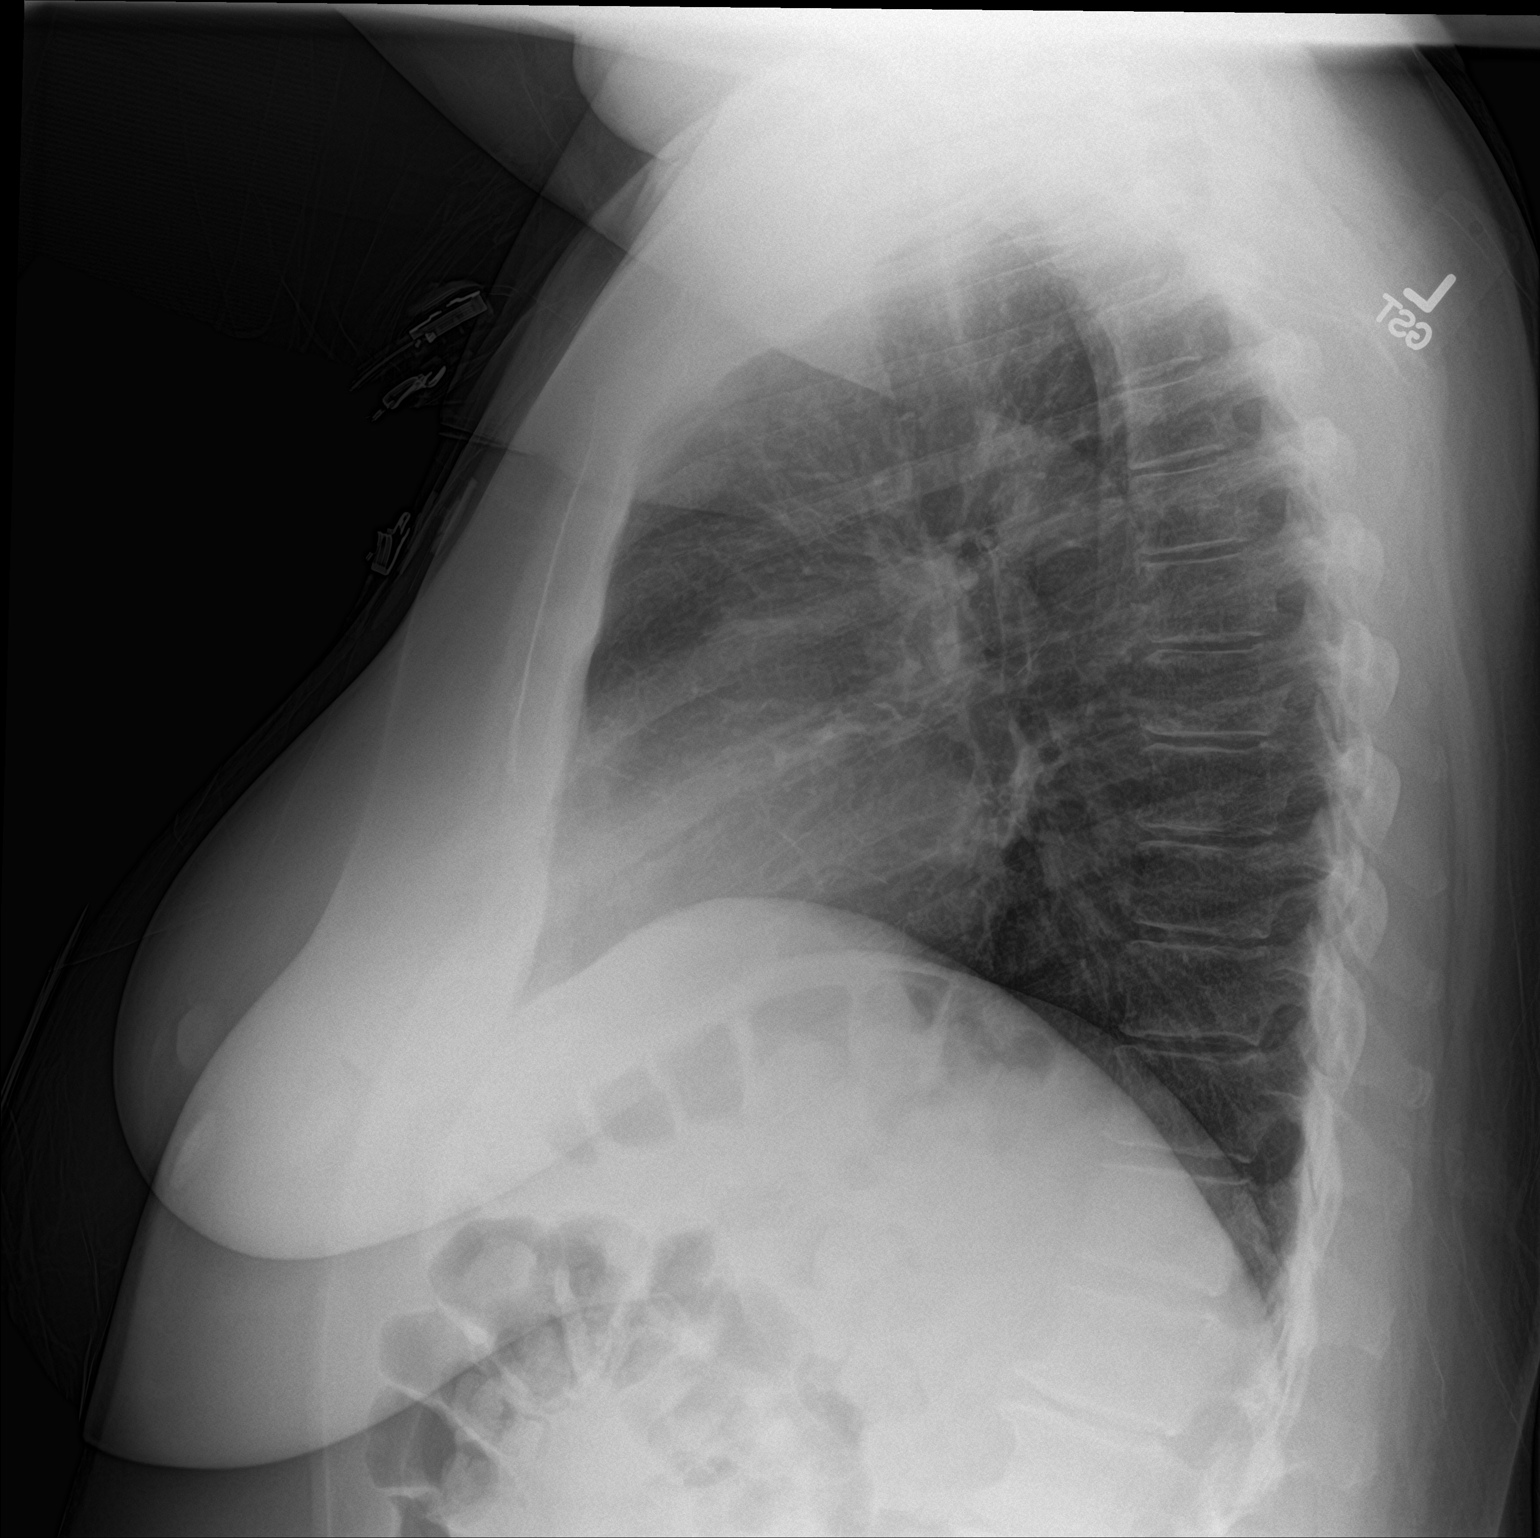

[2 of 2 positions shown; findings below may reference images not displayed]

FINDINGS: The heart size and mediastinal contours are within normal limits.
Both lungs are clear. Mild widening of the right AC joint with
possible surgical changes at the distal right clavicle.
IMPRESSION: No active cardiopulmonary disease.

## 2020-01-13 ENCOUNTER — Ambulatory Visit: Payer: 59 | Admitting: Podiatry

## 2020-02-21 ENCOUNTER — Encounter: Payer: Self-pay | Admitting: Emergency Medicine

## 2020-02-21 ENCOUNTER — Ambulatory Visit
Admission: EM | Admit: 2020-02-21 | Discharge: 2020-02-21 | Disposition: A | Discharge status: Home / Self Care | Payer: 59 | Attending: Family Medicine | Admitting: Family Medicine

## 2020-02-21 DIAGNOSIS — M545 Low back pain, unspecified: Secondary | ICD-10-CM | POA: Diagnosis not present

## 2020-02-21 MED ORDER — KETOROLAC TROMETHAMINE 60 MG/2ML IM SOLN
30.0000 mg | Freq: Once | INTRAMUSCULAR | Status: AC
  Administered 2020-02-21: 30 mg via INTRAMUSCULAR

## 2020-02-21 MED ORDER — PREDNISONE 20 MG PO TABS
ORAL_TABLET | ORAL | 0 refills | Status: DC
Start: 2020-02-21 — End: 2020-03-24

## 2020-02-21 NOTE — ED Triage Notes (Signed)
Pain in mid lower back with movement since last week.  Denies any injury

## 2020-02-21 NOTE — ED Provider Notes (Signed)
[facility]    CSN: [id number] Arrival date & time: 02/21/20  1623      History   Chief Complaint Chief Complaint  Patient presents with  . Back Pain    HPI Angela Abbott is a 60 y.o. female.   HPI  Patient presents today with bilateral lumbar sacral pain progressing into V2.  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  She has been told  previously that she has degenerative arthritic back disease.  She has been managing pain with alternating ice and heat as well as over-the-counter ibuprofen and occasionally has taken her chronic pain medication with temporary relief.  Past Medical History:  Diagnosis Date  . Arthritis    shoulders, knees, hips  . Carotid artery occlusion   . Dental crowns present   . GERD (gastroesophageal reflux disease)   . Hypertension   . Hypothyroidism   . Left knee pain 07/2014  . Pneumonia    hx of 02/2017   . Right knee pain   . Stress incontinence   . Stroke [facility])    speech difficulty recalling words  . Wears partial dentures    upper and lower    Patient Active Problem List   Diagnosis Date Noted  . History of CVA (cerebrovascular accident) 11/14/2019  . Class 3 severe obesity due to excess calories with serious comorbidity and body mass index (BMI) of 45.0 to 49.9 in adult ([place]) 11/14/2019  . Hypercholesterolemia 08/19/2018  . Essential hypertension 08/19/2018  . Numbness 11/01/2017  . TIA (transient ischemic attack) 11/01/2017  . Sensory disturbance 11/01/2017  . Acute ischemic stroke ([place]) 11/01/2017  . CVA (cerebral vascular accident) ([place]) 10/27/2017  . Tobacco abuse 10/27/2017  . Trigger middle finger of right hand 06/19/2017  . Chest pain 02/14/2017  . Multifocal pneumonia 02/14/2017  . Hypothyroidism 02/14/2017  . GERD (gastroesophageal reflux disease) 02/14/2017  .  Pneumonia 02/14/2017  . Cigarette smoker 11/13/2016  . Chronic cough 11/12/2016  . Mallet deformity of left ring finger 08/22/2015  . Sciatica of left side 05/18/2013  . Arthritis of wrist, left, degenerative 03/30/2013  . History of laparoscopic adjustable gastric banding, 02/17/2008 04/29/2012  . Morbid obesity ([place]) 01/16/2012  . PLANTAR FACIITIS 07/01/2007  . KNEE PAIN 04/29/2007  . TEAR MEDIAL MENISCUS 04/29/2007    Past Surgical History:  Procedure Laterality Date  . ABDOMINAL HYSTERECTOMY     partial  . CAROTID ENDARTERECTOMY    . [place]  . ENDARTERECTOMY Right 11/05/2017   Procedure: ENDARTERECTOMY CAROTID RIGHT;  Surgeon: [name], MD;  Location: [facility] OR;  Service: Vascular;  Laterality: Right;  . ESOPHAGOGASTRODUODENOSCOPY N/A 11/26/2012   Procedure: ESOPHAGOGASTRODUODENOSCOPY (EGD);  Surgeon: Shann Medal, MD;  Location: [facility];  Service: General;  Laterality: N/A;  . GANGLION CYST EXCISION Right 02/2008   foot  . KNEE ARTHROSCOPY WITH LATERAL MENISECTOMY Left 07/21/2014   Procedure: LEFT KNEE ARTHROSCOPY,  CHONDROPLASTY, WITH LATERAL AND MEDIAL  MENISCECTOMIES;  Surgeon: Kathryne Hitch, MD;  Location: [place];  Service: Orthopedics;  Laterality: Left;  . KNEE ARTHROSCOPY WITH MEDIAL MENISECTOMY Left 07/21/2014   Procedure: KNEE ARTHROSCOPY WITH MEDIAL MENISECTOMY;  Surgeon: Kathryne Hitch, MD;  Location: [place];  Service: Orthopedics;  Laterality: Left;  . LAPAROSCOPIC GASTRIC BANDING  02/18/2008  . LAPAROSCOPIC REPAIR AND  REMOVAL OF GASTRIC BAND N/A 03/21/2017   Procedure: LAPAROSCOPIC REPAIR AND REMOVAL OF GASTRIC BAND;  Surgeon: [name], MD;  [facility];  Service: General;  Laterality: N/A;  . PARTIAL HYSTERECTOMY  09/07/2002  . PATCH ANGIOPLASTY Right 11/05/2017   Procedure: PATCH ANGIOPLASTY of right carotid artery using xenosure bovine pericardium patch;  Surgeon: [name], MD;   Location: [place] OR;  Service: Vascular;  Laterality: Right;  . SHOULDER ARTHROSCOPY WITH SUBACROMIAL DECOMPRESSION, ROTATOR CUFF REPAIR AND BICEP TENDON REPAIR Right 11/23/2015   Procedure: RIGHT SHOULDER ARTHROSCOPY WITH DEBRIDEMENT, SUBACROMIAL DECOMPRESSION, DISTAL CLAVICLE EXCISION, ROTATOR CUFF REPAIR AND BICEP TENODESIS;  Surgeon: Ninetta Lights, MD;  Location: [place];  Service: Orthopedics;  Laterality: Right;    OB History   No obstetric history on file.      Home Medications    Prior to Admission medications   Medication Sig Start Date End Date Taking? Authorizing Provider  aspirin 325 MG tablet Take 1 tablet (325 mg total) by mouth daily. 10/29/17   Hosie Poisson, MD  atorvastatin (LIPITOR) 10 MG tablet Take 10 mg by mouth daily. 11/16/18   [provider]  cephALEXin (KEFLEX) 500 MG capsule cephalexin 500 mg capsule    [provider]  cholecalciferol (VITAMIN D3) 25 MCG (1000 UNIT) tablet Take 2,000 Units by mouth daily.    [provider]  citalopram (CELEXA) 10 MG tablet Take 10 mg by mouth daily. 02/11/19   [provider]  diazepam (VALIUM) 5 MG tablet diazepam 5 mg tablet    [provider]  diclofenac (VOLTAREN) 75 MG EC tablet Take 1 tablet (75 mg total) by mouth 2 (two) times daily. 08/30/19   Wallene Huh, DPM  diltiazem (CARDIZEM CD) 180 MG 24 hr capsule Take 180 mg by mouth at bedtime.  10/31/17   [provider]  doxycycline (VIBRAMYCIN) 100 MG capsule doxycycline hyclate 100 mg capsule    [provider]  gabapentin (NEURONTIN) 300 MG capsule Take 300 mg by mouth at bedtime. 02/11/19   [provider]  HYDROcodone-acetaminophen (NORCO) 10-325 MG tablet hydrocodone 10 mg-acetaminophen 325 mg tablet    [provider]  levothyroxine (SYNTHROID) 150 MCG tablet Take 150 mcg by mouth every morning. 02/16/19   [provider]  omeprazole (PRILOSEC) 40 MG capsule Take 1  capsule (40 mg total) by mouth daily. 02/15/17   Kathie Dike, MD  senna-docusate (SENOKOT-S) 8.6-50 MG tablet Take 1 tablet by mouth at bedtime as needed for mild constipation. Patient taking differently: Take 1 tablet by mouth daily.  10/29/17   Hosie Poisson, MD    Family History Family History  Problem Relation Age of Onset  . CVA Other   . Diabetes Other   . CAD Other   . CVA Mother   . Cancer Neg Hx     Social History Social History   Tobacco Use  . Smoking status: Former Smoker    Packs/day: 1.00    Years: 35.00    Pack years: 35.00    Types: Cigarettes    Quit date: 10/27/2017    Years since quitting: 2.3  . Smokeless tobacco: Never Used  . Tobacco comment: in process of quiting only 1 cigeratte today  Vaping Use  . Vaping Use: Former  Substance Use Topics  . Alcohol use: Yes    Comment: occasionally  . Drug use: No     Allergies   Bee venom   Review of Systems  Review of Systems Pertinent negatives listed in HPI  Physical Exam Triage Vital Signs ED Triage Vitals  Enc Vitals Group     BP 02/21/20 1628 (!) 145/81     Pulse Rate 02/21/20 1628 96     Resp 02/21/20 1628 18     Temp 02/21/20 1628 98.2 F (36.8 C)     Temp src --      SpO2 02/21/20 1628 92 %     Weight --      Height --      Head Circumference --      Peak Flow --      Pain Score 02/21/20 1634 8     Pain Loc --      Pain Edu? --      Excl. in [place]? --    No data found.  Updated Vital Signs BP (!) 145/81   Pulse 96   Temp 98.2 F (36.8 C)   Resp 18   SpO2 92%   Visual Acuity Right Eye Distance:   Left Eye Distance:   Bilateral Distance:    Right Eye Near:   Left Eye Near:    Bilateral Near:     Physical Exam Constitutional:      Appearance: Normal appearance.  HENT:     Head: Normocephalic.  Cardiovascular:     Rate and Rhythm: Normal rate.  Pulmonary:     Effort: Pulmonary effort is normal.     Breath sounds: Normal breath sounds.  Musculoskeletal:      Lumbar back: Tenderness present. No bony tenderness. Decreased range of motion.       Back:  Skin:    Capillary Refill: Capillary refill takes less than 2 seconds.  Neurological:     General: No focal deficit present.     Mental Status: She is alert and oriented to person, place, and time.      UC Treatments / Results  Labs (all labs ordered are listed, but only abnormal results are displayed) Labs Reviewed - No data to display  EKG   Radiology No results found.  Procedures Procedures (including critical care time)  Medications Ordered in UC Medications - No data to display  Initial Impression / Assessment and Plan / UC Course  I have reviewed the triage vital signs and the nursing notes.  Pertinent labs & imaging results that were available during my care of the patient were reviewed by me and considered in my medical decision making (see chart for details).    Toradol 30 mg IM given here in clinic today.  Will discharge home with a prednisone taper. Work note provided . Patient requested extended absence however advised our clinic doesn't provide FMLA or extended leaves. She will follow-up with PCP if symptoms havent improved by the date given to return to work. Final Clinical Impressions(s) / UC Diagnoses   Final diagnoses:  Lumbar spine painful on movement   Discharge Instructions   None    ED Prescriptions    Medication Sig Dispense Auth. Provider   predniSONE (DELTASONE) 20 MG tablet Take 3 PO QAM x3days, 2 PO QAM x3days, 1 PO QAM x3days 18 tablet Scot Jun, FNP     PDMP not reviewed this encounter.   Scot Jun, FNP 02/21/20 1742

## 2020-03-13 ENCOUNTER — Ambulatory Visit (HOSPITAL_COMMUNITY)
Admission: RE | Admit: 2020-03-13 | Discharge: 2020-03-13 | Disposition: A | Discharge status: Home / Self Care | Payer: 59 | Source: Ambulatory Visit | Attending: Physician Assistant | Admitting: Physician Assistant

## 2020-03-13 ENCOUNTER — Other Ambulatory Visit (HOSPITAL_COMMUNITY): Payer: Self-pay | Admitting: Physician Assistant

## 2020-03-13 DIAGNOSIS — M545 Low back pain, unspecified: Secondary | ICD-10-CM

## 2020-03-24 ENCOUNTER — Other Ambulatory Visit: Payer: Self-pay

## 2020-03-24 ENCOUNTER — Ambulatory Visit (INDEPENDENT_AMBULATORY_CARE_PROVIDER_SITE_OTHER): Payer: 59 | Admitting: Cardiology

## 2020-03-24 ENCOUNTER — Encounter: Payer: Self-pay | Admitting: Cardiology

## 2020-03-24 VITALS — BP 147/81 | HR 74 | Ht 66.0 in | Wt 268.0 lb

## 2020-03-24 DIAGNOSIS — E7800 Pure hypercholesterolemia, unspecified: Secondary | ICD-10-CM

## 2020-03-24 DIAGNOSIS — Z8673 Personal history of transient ischemic attack (TIA), and cerebral infarction without residual deficits: Secondary | ICD-10-CM

## 2020-03-24 DIAGNOSIS — I10 Essential (primary) hypertension: Secondary | ICD-10-CM

## 2020-03-24 DIAGNOSIS — Z6841 Body Mass Index (BMI) 40.0 and over, adult: Secondary | ICD-10-CM

## 2020-03-24 DIAGNOSIS — Z7189 Other specified counseling: Secondary | ICD-10-CM

## 2020-03-24 NOTE — Patient Instructions (Signed)
Medication Instructions:  Your Physician recommend you continue on your current medication as directed.    *If you need a refill on your cardiac medications before your next appointment, please call your pharmacy*   Lab Work: None   Testing/Procedures: None   Follow-Up: At [facility], you and your health needs are our priority.  As part of our continuing mission to provide you with exceptional heart care, we have created designated Provider Care Teams.  These Care Teams include your primary Cardiologist (physician) and Advanced Practice Providers (APPs -  Physician Assistants and Nurse Practitioners) who all work together to provide you with the care you need, when you need it.  We recommend signing up for the patient portal called "MyChart".  Sign up information is provided on this After Visit Summary.  MyChart is used to connect with patients for Virtual Visits (Telemedicine).  Patients are able to view lab/test results, encounter notes, upcoming appointments, etc.  Non-urgent messages can be sent to your provider as well.   To learn more about what you can do with MyChart, go to [email or web address].    Your next appointment:   2 year(s)  The format for your next appointment:   In Person  Provider:   Bridgette Christopher, MD     

## 2020-03-24 NOTE — Progress Notes (Signed)
Cardiology Office Note:    Date:  03/24/2020   ID:  Angela Abbott, DOB 07/02/1960, MRN [id number]  PCP:  Sharilyn Sites, MD  Cardiologist:  Buford Dresser, MD  Referring MD: Sharilyn Sites, MD   CC: follow up  History of Present Illness:    Angela Abbott is a 60 y.o. female with a hx of hypertension, TIA/CVA with prior right CEA  who is seen for follow up today. I initially met her 09/28/19 as a new consult at the request of Sharilyn Sites, MD for the evaluation and management of preoperative cardiovascular evaluation. Had been planned for bariatric surgery (prior lap band 2010, removed 2/2 reflux/dysphagia. Per note, discussed Roux-en-Y gastric bypass with Dr. [name].  History of CAD/PAD/CVA/TIA: Has history of R CEA 2019 for carotid disease. Follows with Dr. Trula Slade. Noted to have prior embolic infarcts in the right brain on MRI  Quit smoking in 2019, was a smoker for >40 years.  Today: Having back pain, going to orthopedics soon to follow up with this.  Her insurance requires BMI >40 for 5 years prior to gastric bypass surgery. Failed lap band. This is disappointing, as she would clearly benefit from bariatric surgery given her vascular disease and hypertension. She is working to challenge this with her insurer.  Doesn't check BP routinely at home as it is usually well controlled, 120s/70s. Reports good control at recent visit (I do not have records). Discussed goal BP. She will monitor at home and contact me if it remains elevated. On diltiazem 180 mg daily.  Denies chest pain, shortness of breath at rest or with normal exertion. No PND, orthopnea, LE edema or unexpected weight gain. No syncope or palpitations.  Past Medical History:  Diagnosis Date  . Arthritis    shoulders, knees, hips  . Carotid artery occlusion   . Dental crowns present   . GERD (gastroesophageal reflux disease)   . Hypertension   . Hypothyroidism   . Left knee pain 07/2014  . Pneumonia    hx  of 02/2017   . Right knee pain   . Stress incontinence   . Stroke [facility])    speech difficulty recalling words  . Wears partial dentures    upper and lower    Past Surgical History:  Procedure Laterality Date  . ABDOMINAL HYSTERECTOMY     partial  . CAROTID ENDARTERECTOMY    . [place]  . ENDARTERECTOMY Right 11/05/2017   Procedure: ENDARTERECTOMY CAROTID RIGHT;  Surgeon: [name], MD;  Location: [facility] OR;  Service: Vascular;  Laterality: Right;  . ESOPHAGOGASTRODUODENOSCOPY N/A 11/26/2012   Procedure: ESOPHAGOGASTRODUODENOSCOPY (EGD);  Surgeon: Shann Medal, MD;  Location: [facility];  Service: General;  Laterality: N/A;  . GANGLION CYST EXCISION Right 02/2008   foot  . KNEE ARTHROSCOPY WITH LATERAL MENISECTOMY Left 07/21/2014   Procedure: LEFT KNEE ARTHROSCOPY,  CHONDROPLASTY, WITH LATERAL AND MEDIAL  MENISCECTOMIES;  Surgeon: Kathryne Hitch, MD;  Location: [place];  Service: Orthopedics;  Laterality: Left;  . KNEE ARTHROSCOPY WITH MEDIAL MENISECTOMY Left 07/21/2014   Procedure: KNEE ARTHROSCOPY WITH MEDIAL MENISECTOMY;  Surgeon: Kathryne Hitch, MD;  Location: [place];  Service: Orthopedics;  Laterality: Left;  . LAPAROSCOPIC GASTRIC BANDING  02/18/2008  . LAPAROSCOPIC REPAIR AND REMOVAL OF GASTRIC BAND N/A 03/21/2017   Procedure: LAPAROSCOPIC REPAIR AND REMOVAL OF GASTRIC BAND;  Surgeon: [name], MD;  [facility];  Service: General;  Laterality: N/A;  . PARTIAL  HYSTERECTOMY  09/07/2002  . PATCH ANGIOPLASTY Right 11/05/2017   Procedure: PATCH ANGIOPLASTY of right carotid artery using xenosure bovine pericardium patch;  Surgeon: [name], MD;  Location: [place] OR;  Service: Vascular;  Laterality: Right;  . SHOULDER ARTHROSCOPY WITH SUBACROMIAL DECOMPRESSION, ROTATOR CUFF REPAIR AND BICEP TENDON REPAIR Right 11/23/2015   Procedure: RIGHT SHOULDER ARTHROSCOPY WITH DEBRIDEMENT, SUBACROMIAL DECOMPRESSION, DISTAL CLAVICLE  EXCISION, ROTATOR CUFF REPAIR AND BICEP TENODESIS;  Surgeon: Ninetta Lights, MD;  Location: [place];  Service: Orthopedics;  Laterality: Right;    Current Medications: Current Outpatient Medications on File Prior to Visit  Medication Sig  . aspirin 325 MG tablet Take 1 tablet (325 mg total) by mouth daily.  Marland Kitchen atorvastatin (LIPITOR) 10 MG tablet Take 10 mg by mouth daily.  . cholecalciferol (VITAMIN D3) 25 MCG (1000 UNIT) tablet Take 2,000 Units by mouth daily.  . citalopram (CELEXA) 10 MG tablet Take 10 mg by mouth daily.  Marland Kitchen diltiazem (CARDIZEM CD) 180 MG 24 hr capsule Take 180 mg by mouth at bedtime.   . docusate sodium (STOOL SOFTENER) 100 MG capsule Take 100 mg by mouth in the morning.  . gabapentin (NEURONTIN) 300 MG capsule Take 300 mg by mouth at bedtime.  Marland Kitchen levothyroxine (SYNTHROID) 150 MCG tablet Take 150 mcg by mouth every morning.  Marland Kitchen omeprazole (PRILOSEC) 40 MG capsule Take 1 capsule (40 mg total) by mouth daily.   No current facility-administered medications on file prior to visit.     Allergies:   Bee venom   Social History   Tobacco Use  . Smoking status: Former Smoker    Packs/day: 1.00    Years: 35.00    Pack years: 35.00    Types: Cigarettes    Quit date: 10/27/2017    Years since quitting: 2.4  . Smokeless tobacco: Never Used  . Tobacco comment: in process of quiting only 1 cigeratte today  Vaping Use  . Vaping Use: Former  Substance Use Topics  . Alcohol use: Yes    Comment: occasionally  . Drug use: No    Family History: family history includes CAD in an other family member; CVA in her mother and another family member; Diabetes in an other family member. There is no history of Cancer.  ROS:   Please see the history of present illness.  Additional ROS otherwise unremarkable.    EKGs/Labs/Other Studies Reviewed:    The following studies were reviewed today: Echo 10/28/17 - Left ventricle: The cavity size was normal. There was  mild  concentric hypertrophy. Systolic function was normal. The  estimated ejection fraction was in the range of 60% to 65%. Wall  motion was normal; there were no regional wall motion  abnormalities. Doppler parameters are consistent with abnormal  left ventricular relaxation (grade 1 diastolic dysfunction).  - Left atrium: The atrium was mildly dilated.   EKG:  EKG is personally reviewed.  The ekg ordered 09/28/19 demonstrates NSR 68 bpm  Recent Labs: No results found for requested labs within last 8760 hours.  Recent Lipid Panel    Component Value Date/Time   CHOL 44 10/28/2017 0437   TRIG 32 10/28/2017 0437   HDL 18 (L) 10/28/2017 0437   CHOLHDL 2.4 10/28/2017 0437   VLDL 6 10/28/2017 0437   LDLCALC 20 10/28/2017 0437    Physical Exam:    VS:  BP (!) 147/81   Pulse 74   Ht '5\' 6"'  (1.676 m)   Wt 268 lb (121.6  kg)   SpO2 99%   BMI 43.26 kg/m     Wt Readings from Last 3 Encounters:  03/24/20 268 lb (121.6 kg)  09/28/19 279 lb 3.2 oz (126.6 kg)  07/26/19 276 lb 3.2 oz (125.3 kg)    GEN: Well nourished, well developed in no acute distress HEENT: Normal, moist mucous membranes NECK: No JVD CARDIAC: regular rhythm, normal S1 and S2, no rubs or gallops. No murmur. VASCULAR: Radial and DP pulses 2+ bilaterally. No carotid bruits RESPIRATORY:  Clear to auscultation without rales, wheezing or rhonchi  ABDOMEN: Soft, non-tender, non-distended MUSCULOSKELETAL:  Ambulates independently SKIN: Warm and dry, no edema NEUROLOGIC:  Alert and oriented x 3. No focal neuro deficits noted. PSYCHIATRIC:  Normal affect   ASSESSMENT:    1. Essential hypertension   2. Hypercholesterolemia   3. History of CVA (cerebrovascular accident)   4. Class 3 severe obesity due to excess calories with serious comorbidity and body mass index (BMI) of 40.0 to 44.9 in adult [facility])   5. Cardiac risk counseling   6. Counseling on health promotion and disease prevention    PLAN:     Preoperative cardiovascular risk: see initial note from 09/28/19. Asymptomatic, acceptable risk for bariatric surgery at that time. Hopefully she can work something out with [employer] and have this soon, as she would benefit from it given her ASCVD history.  Hypertension: -elevated today, but reports good control at home. Continue diltiazem for now.  Morbid obesity: BMI 43, planning for bariatric surgery  Prior CVA/TIA, with history of carotid stenosis s/p R CEA -on aspirin, defer dose to Dr. Trula Slade -continue atorvastatin, LDL goal <70  Cardiac risk counseling and prevention recommendations: -recommend heart healthy/Mediterranean diet, with whole grains, fruits, vegetable, fish, lean meats, nuts, and olive oil. Limit salt. -recommend moderate walking, 3-5 times/week for 30-50 minutes each session. Aim for at least 150 minutes.week. Goal should be pace of 3 miles/hours, or walking 1.5 miles in 30 minutes -recommend avoidance of tobacco products. Avoid excess alcohol.  Plan for follow up: every two years for risk factor management.  Buford Dresser, MD, PhD, [facility]    Medication Adjustments/Labs and Tests Ordered: Current medicines are reviewed at length with the patient today.  Concerns regarding medicines are outlined above.  No orders of the defined types were placed in this encounter.  No orders of the defined types were placed in this encounter.   Patient Instructions  Medication Instructions:  Your Physician recommend you continue on your current medication as directed.    *If you need a refill on your cardiac medications before your next appointment, please call your pharmacy*   Lab Work: None   Testing/Procedures: None   Follow-Up: At [facility], you and your health needs are our priority.  As part of our continuing mission to provide you with exceptional heart care, we have created designated Provider Care Teams.   These Care Teams include your primary Cardiologist (physician) and Advanced Practice Providers (APPs -  Physician Assistants and Nurse Practitioners) who all work together to provide you with the care you need, when you need it.  We recommend signing up for the patient portal called "MyChart".  Sign up information is provided on this After Visit Summary.  MyChart is used to connect with patients for Virtual Visits (Telemedicine).  Patients are able to view lab/test results, encounter notes, upcoming appointments, etc.  Non-urgent messages can be sent to your provider as well.  To learn more about what you can do with MyChart, go to [email or web address].    Your next appointment:   2 year(s)  The format for your next appointment:   In Person  Provider:   Buford Dresser, MD     Signed, Buford Dresser, MD PhD 03/24/2020  [place]

## 2020-06-22 ENCOUNTER — Ambulatory Visit: Payer: 59 | Admitting: Podiatry

## 2020-08-08 ENCOUNTER — Ambulatory Visit (INDEPENDENT_AMBULATORY_CARE_PROVIDER_SITE_OTHER): Payer: 59 | Admitting: Family Medicine

## 2020-08-08 ENCOUNTER — Other Ambulatory Visit: Payer: Self-pay

## 2020-08-08 ENCOUNTER — Encounter (INDEPENDENT_AMBULATORY_CARE_PROVIDER_SITE_OTHER): Payer: Self-pay | Admitting: Family Medicine

## 2020-08-08 VITALS — BP 130/83 | HR 71 | Temp 98.5°F | Ht 66.0 in | Wt 264.0 lb

## 2020-08-08 DIAGNOSIS — E559 Vitamin D deficiency, unspecified: Secondary | ICD-10-CM

## 2020-08-08 DIAGNOSIS — Z9189 Other specified personal risk factors, not elsewhere classified: Secondary | ICD-10-CM | POA: Diagnosis not present

## 2020-08-08 DIAGNOSIS — R5383 Other fatigue: Secondary | ICD-10-CM

## 2020-08-08 DIAGNOSIS — E785 Hyperlipidemia, unspecified: Secondary | ICD-10-CM

## 2020-08-08 DIAGNOSIS — E039 Hypothyroidism, unspecified: Secondary | ICD-10-CM

## 2020-08-08 DIAGNOSIS — Z0289 Encounter for other administrative examinations: Secondary | ICD-10-CM

## 2020-08-08 DIAGNOSIS — Z6841 Body Mass Index (BMI) 40.0 and over, adult: Secondary | ICD-10-CM

## 2020-08-08 DIAGNOSIS — Z1331 Encounter for screening for depression: Secondary | ICD-10-CM

## 2020-08-08 DIAGNOSIS — I10 Essential (primary) hypertension: Secondary | ICD-10-CM

## 2020-08-08 DIAGNOSIS — R0602 Shortness of breath: Secondary | ICD-10-CM

## 2020-08-08 DIAGNOSIS — D508 Other iron deficiency anemias: Secondary | ICD-10-CM

## 2020-08-09 LAB — VITAMIN B12: Vitamin B-12: 438 pg/mL (ref 232–1245)

## 2020-08-09 LAB — COMPREHENSIVE METABOLIC PANEL
ALT: 35 IU/L — ABNORMAL HIGH (ref 0–32)
AST: 22 IU/L (ref 0–40)
Albumin/Globulin Ratio: 1.5 (ref 1.2–2.2)
Albumin: 3.9 g/dL (ref 3.8–4.8)
Alkaline Phosphatase: 101 IU/L (ref 44–121)
BUN/Creatinine Ratio: 17 (ref 12–28)
BUN: 12 mg/dL (ref 8–27)
Bilirubin Total: 0.5 mg/dL (ref 0.0–1.2)
CO2: 26 mmol/L (ref 20–29)
Calcium: 9.6 mg/dL (ref 8.7–10.3)
Chloride: 102 mmol/L (ref 96–106)
Creatinine, Ser: 0.72 mg/dL (ref 0.57–1.00)
Globulin, Total: 2.6 g/dL (ref 1.5–4.5)
Glucose: 97 mg/dL (ref 65–99)
Potassium: 5 mmol/L (ref 3.5–5.2)
Sodium: 142 mmol/L (ref 134–144)
Total Protein: 6.5 g/dL (ref 6.0–8.5)
eGFR: 95 mL/min/{1.73_m2} (ref >59)

## 2020-08-09 LAB — HEMOGLOBIN A1C
Est. average glucose Bld gHb Est-mCnc: 146 mg/dL
Hgb A1c MFr Bld: 6.7 % — ABNORMAL HIGH (ref 4.8–5.6)

## 2020-08-09 LAB — CBC WITH DIFFERENTIAL/PLATELET
Basophils Absolute: 0.1 10*3/uL (ref 0.0–0.2)
Basos: 1 %
EOS (ABSOLUTE): 0.2 10*3/uL (ref 0.0–0.4)
Eos: 3 %
Hematocrit: 43.1 % (ref 34.0–46.6)
Hemoglobin: 13.6 g/dL (ref 11.1–15.9)
Immature Grans (Abs): 0 10*3/uL (ref 0.0–0.1)
Immature Granulocytes: 0 %
Lymphocytes Absolute: 1.5 10*3/uL (ref 0.7–3.1)
Lymphs: 23 %
MCH: 27.9 pg (ref 26.6–33.0)
MCHC: 31.6 g/dL (ref 31.5–35.7)
MCV: 88 fL (ref 79–97)
Monocytes Absolute: 0.6 10*3/uL (ref 0.1–0.9)
Monocytes: 9 %
Neutrophils Absolute: 4.3 10*3/uL (ref 1.4–7.0)
Neutrophils: 64 %
Platelets: 298 10*3/uL (ref 150–450)
RBC: 4.88 x10E6/uL (ref 3.77–5.28)
RDW: 14.3 % (ref 11.7–15.4)
WBC: 6.6 10*3/uL (ref 3.4–10.8)

## 2020-08-09 LAB — T3: T3, Total: 147 ng/dL (ref 71–180)

## 2020-08-09 LAB — LIPID PANEL WITH LDL/HDL RATIO
Cholesterol, Total: 154 mg/dL (ref 100–199)
HDL: 42 mg/dL (ref >39)
LDL Chol Calc (NIH): 93 mg/dL (ref 0–99)
LDL/HDL Ratio: 2.2 ratio (ref 0.0–3.2)
Triglycerides: 105 mg/dL (ref 0–149)
VLDL Cholesterol Cal: 19 mg/dL (ref 5–40)

## 2020-08-09 LAB — VITAMIN D 25 HYDROXY (VIT D DEFICIENCY, FRACTURES): Vit D, 25-Hydroxy: 53.7 ng/mL (ref 30.0–100.0)

## 2020-08-09 LAB — T4, FREE: Free T4: 1.58 ng/dL (ref 0.82–1.77)

## 2020-08-09 LAB — FOLATE: Folate: 5.6 ng/mL (ref >3.0)

## 2020-08-09 LAB — TSH: TSH: 1.21 u[IU]/mL (ref 0.450–4.500)

## 2020-08-09 LAB — INSULIN, RANDOM: INSULIN: 16.2 u[IU]/mL (ref 2.6–24.9)

## 2020-08-10 NOTE — Progress Notes (Signed)
Chief Complaint:   OBESITY Angela Abbott (MR# [id number]) is a 60 y.o. female who presents for evaluation and treatment of obesity and related comorbidities. Current BMI is Body mass index is 42.61 kg/m. Angela Abbott has been struggling with her weight for many years and has been unsuccessful in either losing weight, maintaining weight loss, or reaching her healthy weight goal.  Angela Abbott is a [employer]. There are 4 people who lives with her, her son, son's girlfriend, and grandsons ages 64 and 32. She is here because she wants to lose 100 pounds in 1 year. She needs a knee replacement ad she was referred by Dr. Percell Miller of [facility]. Her primary care physician is with [place] in [place]. She craves sweets (any kind). She is on Celexa for menopausal sweats along with gabapentin. She denies depression or anxiety, and she declines a referral for emotional eating counseling because this is not an issue for her.  Angela Abbott is currently in the action stage of change and ready to dedicate time achieving and maintaining a healthier weight. Angela Abbott is interested in becoming our patient and working on intensive lifestyle modifications including (but not limited to) diet and exercise for weight loss.  Angela Abbott habits were reviewed today and are as follows: Her family eats meals together, she thinks her family will eat healthier with her, her desired weight loss is 76 lbs, she has been heavy most of her life, she started gaining weight while pregnant, her heaviest weight ever was 278 pounds, she has significant food cravings issues, she is frequently drinking liquids with calories, she frequently eats larger portions than normal, and she struggles with emotional eating.  Depression Screen Angela Abbott's Food and Mood (modified PHQ-9) score was 11.  Depression screen PHQ 2/9 08/08/2020  Decreased Interest 1  Down, Depressed, Hopeless 1  PHQ - 2 Score 2  Altered sleeping 0  Tired, decreased energy 1   Change in appetite 1  Feeling bad or failure about yourself  2  Trouble concentrating 1  Moving slowly or fidgety/restless 2  Suicidal thoughts 0  PHQ-9 Score 9  Difficult doing work/chores Very difficult   Subjective:   1. Other fatigue [name] admits to daytime somnolence and admits to waking up still tired. Patent has a history of symptoms of daytime fatigue. Angela Abbott generally gets 7.5 hours of sleep per night, and states that she has generally restful sleep. Snoring is present. Apneic episodes are not present. Epworth Sleepiness Score is 10.  2. Shortness of breath on exertion Angela Abbott smoked for 46 years, 1 pack a day. She quit smoking in 2019. She had a sleep study in 2009 or 2010 and it was negative. She has no change in symptoms since then. Her ES5 score is 10. [name] denies shortness of breath at rest or orthopnea.  3. Hypertension, unspecified type Angela Abbott is status post cardiovascular accident in 2019. She is on ASA 325 mg q daily since then. She has had Right carotid endarterectomy.  4. Hypothyroidism, unspecified type Angela Abbott is taking levothyroxine. Current symptoms: none.   5. Hyperlipidemia, unspecified hyperlipidemia type Angela Abbott has hyperlipidemia and has been trying to improve her cholesterol levels with intensive lifestyle modification including a low saturated fat diet, exercise and weight loss. She is taking Lipitor, and she denies any chest pain, claudication or myalgias.  6. Vitamin D deficiency Angela Abbott is currently taking OTC Vit D, but she is unsure of the dosage. She denies nausea, vomiting or muscle weakness.  7. Iron deficiency  anemia secondary to inadequate dietary iron intake Angela Abbott has iron deficiency anemia secondary to weight loss surgery (lap band in 2010). She is on OTC iron 65 mg Fe q daily. She has her lap band removed in 2019, and her insurance requires her to be "morbidly obese" for 5 years prior to gastric bypass which she plans to do.   8.  At risk for impaired metabolic function Angela Abbott is at increased risk for impaired metabolic function due to current nutrition and muscle mass.  Assessment/Plan:   Orders Placed This Encounter  Procedures   Vitamin B12   CBC with Differential/Platelet   Comprehensive metabolic panel   Folate   Hemoglobin A1c   Insulin, random   Lipid Panel With LDL/HDL Ratio   T3   TSH   T4, free   VITAMIN D 25 Hydroxy (Vit-D Deficiency, Fractures)   EKG 12-Lead    Medications Discontinued During This Encounter  Medication Reason   cholecalciferol (VITAMIN D3) 25 MCG (1000 UNIT) tablet Error     No orders of the defined types were placed in this encounter.    1. Other fatigue Angela Abbott does feel that her weight is causing her energy to be lower than it should be. Fatigue may be related to obesity, depression or many other causes. Labs will be ordered, and in the meanwhile, [name] will focus on self care including making healthy food choices, increasing physical activity and focusing on stress reduction.  - Vitamin B12 - CBC with Differential/Platelet - EKG 12-Lead - Hemoglobin A1c - Insulin, random  2. Shortness of breath on exertion Angela Abbott does feel that she gets out of breath more easily that she used to when she exercises. Angela Abbott's shortness of breath appears to be obesity related and exercise induced. She has agreed to work on weight loss and gradually increase exercise to treat her exercise induced shortness of breath. Will continue to monitor closely.  3. Hypertension, unspecified type We will check labs today. Angela Abbott's blood pressure is essentially at goal. She will continue working on healthy weight loss and exercise to improve blood pressure control. We will watch for signs of hypotension as she continues her lifestyle modifications.  - Comprehensive metabolic panel  4. Hypothyroidism, unspecified type We will check labs today. Orders and follow up as documented in patient  record.  Counseling Good thyroid control is important for overall health. Supratherapeutic thyroid levels are dangerous and will not improve weight loss results. Counseling: The correct way to take levothyroxine is fasting, with water, separated by at least 30 minutes from breakfast, and separated by more than 4 hours from calcium, iron, multivitamins, acid reflux medications (PPIs).   - T3 - TSH - T4, free  5. Hyperlipidemia, unspecified hyperlipidemia type Cardiovascular risk and specific lipid/LDL goals reviewed. We discussed several lifestyle modifications today. We will check labs today. Shondi will continue to work on diet, exercise and weight loss efforts. Orders and follow up as documented in patient record.   Counseling Intensive lifestyle modifications are the first line treatment for this issue. Dietary changes: Increase soluble fiber. Decrease simple carbohydrates. Exercise changes: Moderate to vigorous-intensity aerobic activity 150 minutes per week if tolerated. Lipid-lowering medications: see documented in medical record.  - Comprehensive metabolic panel - Lipid Panel With LDL/HDL Ratio  6. Vitamin D deficiency Low Vitamin D level contributes to fatigue and are associated with obesity, breast, and colon cancer. We will check labs today. Angela Abbott will follow-up for routine testing of Vitamin D, at least 2-3 times  per year to avoid over-replacement.  7. Iron deficiency anemia secondary to inadequate dietary iron intake We will check labs today. Orders and follow up as documented in patient record.  Counseling Iron is essential for our bodies to make red blood cells.  Reasons that someone may be deficient include: an iron-deficient diet (more likely in those following vegan or vegetarian diets), women with heavy menses, patients with GI disorders or poor absorption, patients that have had bariatric surgery, frequent blood donors, patients with cancer, and patients with heart  disease.   Iron-rich foods include dark leafy greens, red and white meats, eggs, seafood, and beans.   Certain foods and drinks prevent your body from absorbing iron properly. Avoid eating these foods in the same meal as iron-rich foods or with iron supplements. These foods include: coffee, black tea, and red wine; milk, dairy products, and foods that are high in calcium; beans and soybeans; whole grains.  Constipation can be a side effect of iron supplementation. Increased water and fiber intake are helpful. Water goal: > 2 liters/day. Fiber goal: > 25 grams/day.  - Vitamin B12 - CBC with Differential/Platelet - Folate - VITAMIN D 25 Hydroxy (Vit-D Deficiency, Fractures)  8. Screening for depression Angela Abbott had a positive depression screening. Depression is commonly associated with obesity and often results in emotional eating behaviors. We will monitor this closely and work on CBT to help improve the non-hunger eating patterns. Referral to Psychology may be required if no improvement is seen as she continues in our clinic.  9. At risk for impaired metabolic function Angela Abbott was given approximately 23 minutes of impaired  metabolic function prevention counseling today. We discussed intensive lifestyle modifications today with an emphasis on specific nutrition and exercise instructions and strategies.   Repetitive spaced learning was employed today to elicit superior memory formation and behavioral change.  10. Obesity with current BMI of 42.6 Angela Abbott is currently in the action stage of change and her goal is to continue with weight loss efforts. I recommend Angela Abbott begin the structured treatment plan as follows:  She has agreed to the Category 2 Plan.  Exercise goals: As is.   Behavioral modification strategies: increasing lean protein intake, no skipping meals, meal planning and cooking strategies, and planning for success.  She was informed of the importance of frequent follow-up visits  to maximize her success with intensive lifestyle modifications for her multiple health conditions. She was informed we would discuss her lab results at her next visit unless there is a critical issue that needs to be addressed sooner. Angela Abbott agreed to keep her next visit at the agreed upon time to discuss these results.  Objective:   Blood pressure 130/83, pulse 71, temperature 98.5 F (36.9 C), height '5\' 6"'$  (1.676 m), weight 264 lb (119.7 kg), SpO2 93 %. Body mass index is 42.61 kg/m.  EKG: Normal sinus rhythm, rate 71 BPM.  Indirect Calorimeter completed today shows a VO2 of 240 and a REE of 1656.  Her calculated basal metabolic rate is [id number] thus her basal metabolic rate is worse than expected.  General: Cooperative, alert, well developed, in no acute distress. HEENT: Conjunctivae and lids unremarkable. Cardiovascular: Regular rhythm.  Lungs: Normal work of breathing. Neurologic: No focal deficits.   Lab Results  Component Value Date   CREATININE 0.72 08/08/2020   BUN 12 08/08/2020   NA 142 08/08/2020   K 5.0 08/08/2020   CL 102 08/08/2020   CO2 26 08/08/2020   Lab Results  Component  Value Date   ALT 35 (H) 08/08/2020   AST 22 08/08/2020   ALKPHOS 101 08/08/2020   BILITOT 0.5 08/08/2020   Lab Results  Component Value Date   HGBA1C 6.7 (H) 08/08/2020   HGBA1C 5.9 (H) 10/28/2017   Lab Results  Component Value Date   INSULIN 16.2 08/08/2020   Lab Results  Component Value Date   TSH 1.210 08/08/2020   Lab Results  Component Value Date   CHOL 154 08/08/2020   HDL 42 08/08/2020   LDLCALC 93 08/08/2020   TRIG 105 08/08/2020   CHOLHDL 2.4 10/28/2017   Lab Results  Component Value Date   WBC 6.6 08/08/2020   HGB 13.6 08/08/2020   HCT 43.1 08/08/2020   MCV 88 08/08/2020   PLT 298 08/08/2020   Lab Results  Component Value Date   IRON 52 03/10/2007   FERRITIN 38.9 03/10/2007   Attestation Statements:   Reviewed by clinician on day of visit: allergies,  medications, problem list, medical history, surgical history, family history, social history, and previous encounter notes.   Wilhemena Durie, am acting as transcriptionist for [employer], DO.  I have reviewed the above documentation for accuracy and completeness, and I agree with the above. Marjory Sneddon, D.O.  The [place] was signed into law in 2016 which includes the topic of electronic health records.  This provides immediate access to information in MyChart.  This includes consultation notes, operative notes, office notes, lab results and pathology reports.  If you have any questions about what you read please let us know at your next visit so we can discuss your concerns and take corrective action if need be.  We are right here with you.

## 2020-08-22 ENCOUNTER — Ambulatory Visit (INDEPENDENT_AMBULATORY_CARE_PROVIDER_SITE_OTHER): Payer: 59 | Admitting: Family Medicine

## 2020-09-13 ENCOUNTER — Ambulatory Visit (INDEPENDENT_AMBULATORY_CARE_PROVIDER_SITE_OTHER): Payer: 59 | Admitting: Family Medicine

## 2021-01-25 ENCOUNTER — Encounter (INDEPENDENT_AMBULATORY_CARE_PROVIDER_SITE_OTHER): Payer: Self-pay | Admitting: Family Medicine

## 2021-01-25 ENCOUNTER — Other Ambulatory Visit: Payer: Self-pay

## 2021-01-25 ENCOUNTER — Ambulatory Visit (INDEPENDENT_AMBULATORY_CARE_PROVIDER_SITE_OTHER): Payer: 59 | Admitting: Family Medicine

## 2021-01-25 VITALS — BP 138/84 | HR 81 | Temp 98.0°F | Ht 66.0 in | Wt 262.0 lb

## 2021-01-25 DIAGNOSIS — I152 Hypertension secondary to endocrine disorders: Secondary | ICD-10-CM

## 2021-01-25 DIAGNOSIS — E1159 Type 2 diabetes mellitus with other circulatory complications: Secondary | ICD-10-CM

## 2021-01-25 DIAGNOSIS — E039 Hypothyroidism, unspecified: Secondary | ICD-10-CM

## 2021-01-25 DIAGNOSIS — E1169 Type 2 diabetes mellitus with other specified complication: Secondary | ICD-10-CM | POA: Diagnosis not present

## 2021-01-25 DIAGNOSIS — E669 Obesity, unspecified: Secondary | ICD-10-CM

## 2021-01-25 DIAGNOSIS — E782 Mixed hyperlipidemia: Secondary | ICD-10-CM

## 2021-01-25 DIAGNOSIS — D508 Other iron deficiency anemias: Secondary | ICD-10-CM

## 2021-01-25 DIAGNOSIS — Z6841 Body Mass Index (BMI) 40.0 and over, adult: Secondary | ICD-10-CM

## 2021-01-25 DIAGNOSIS — E559 Vitamin D deficiency, unspecified: Secondary | ICD-10-CM

## 2021-01-25 DIAGNOSIS — Z9189 Other specified personal risk factors, not elsewhere classified: Secondary | ICD-10-CM

## 2021-01-25 DIAGNOSIS — Z7984 Long term (current) use of oral hypoglycemic drugs: Secondary | ICD-10-CM

## 2021-01-25 MED ORDER — ATORVASTATIN CALCIUM 20 MG PO TABS: 20.0000 mg | ORAL_TABLET | Freq: Every day | ORAL | 0 refills | Status: DC

## 2021-01-25 MED ORDER — METFORMIN HCL 500 MG PO TABS: ORAL_TABLET | ORAL | 3 refills | Status: DC

## 2021-01-26 LAB — HEMOGLOBIN A1C
Est. average glucose Bld gHb Est-mCnc: 134 mg/dL
Hgb A1c MFr Bld: 6.3 % — ABNORMAL HIGH (ref 4.8–5.6)

## 2021-01-30 DIAGNOSIS — D508 Other iron deficiency anemias: Secondary | ICD-10-CM | POA: Insufficient documentation

## 2021-01-30 DIAGNOSIS — E782 Mixed hyperlipidemia: Secondary | ICD-10-CM | POA: Insufficient documentation

## 2021-01-30 DIAGNOSIS — E559 Vitamin D deficiency, unspecified: Secondary | ICD-10-CM | POA: Insufficient documentation

## 2021-01-30 DIAGNOSIS — E119 Type 2 diabetes mellitus without complications: Secondary | ICD-10-CM | POA: Insufficient documentation

## 2021-02-01 NOTE — Progress Notes (Signed)
Chief Complaint:   OBESITY Angela Abbott is here to discuss her progress with her obesity treatment plan along with follow-up of her obesity related diagnoses. Angela Abbott is on the Category 2 Plan and states she is following her eating plan approximately 0% of the time. Shellyann states she is not currently exercsing.  Today's visit was #: 2 Starting weight: 264 lbs Starting date: 08/08/2020 Today's weight: 262 lbs Today's date: 01/25/2021 Total lbs lost to date: 2 Total lbs lost since last in-office visit: 2  Interim History: Angela Abbott is here today for her first follow-up office visit since starting the program with [place]. All blood work/ lab tests that were recently ordered by myself or an outside provider were reviewed with patient today per their request. Extended time was spent counseling her on all new disease processes that were discovered or preexisting ones that are affected by BMI. She understands that many of these abnormalities will need to monitored regularly along with the current treatment plan of prudent dietary changes, in which we are making each and every office visit, to improve these health parameters. Angela Abbott's first office visit with me was on 08/08/2020. She has had slot of stressful events challenges, and she hasn't been able to follow the meal plan at all. She paid for a [employer] in January 2023, and she hasn't been there yet though.    Subjective:   1. NEW ONSET-  Type 2 diabetes mellitus with other specified complication, without long-term current use of insulin (HCC) Angela Abbott's A1c went from 5.9 three years ago to 6.7 five months ago. She is asymptomatic and she denies excess thirst etc. I discussed labs with the patient today.  2. Hypertension associated with diabetes ([place]) Angela Abbott's blood pressure is not at goal today. She is taking Cardizem. She is asymptomatic and she has no concerns. I discussed labs with the patient today.  3. Mixed diabetic hyperlipidemia  associated with type 2 diabetes mellitus ([place]) Angela Abbott has a new diagnosis of hyperlipidemia. She is taking Lipitor 10 mg daily. Her medication compliance is good per the patient and she is tolerating it well. She denies side effects. She has poor dietary habits. Her LDL is not at goal <70. I discussed labs with the patient today.   4. Hypothyroidism, unspecified type Angela Abbott is taking levothyroxine 150 mcg daily. Her thyroid panel is at goal. Current symptoms: none. I discussed labs with the patient today.  5. Vitamin D deficiency Angela Abbott's Vitamin D level is at goal. She is taking OTC Vit D supplementation. I discussed labs with the patient today.  6. Iron deficiency anemia secondary to inadequate dietary iron intake Angela Abbott is status post lap band surgery in 2010, and she had it removed in 2019. All labs are stable. I discussed labs with the patient today.  7. At risk for deficient knowledge of diabetes mellitus Angela Abbott is at higher than average risk for deficient knowledge of diabetes due to new onset of diabetes mellitus diagnosis today at her office visit.  Assessment/Plan:   Orders Placed This Encounter  Procedures   Hemoglobin A1c    Medications Discontinued During This Encounter  Medication Reason   atorvastatin (LIPITOR) 10 MG tablet Reorder     Meds ordered this encounter  Medications   atorvastatin (LIPITOR) 20 MG tablet    Sig: Take 1 tablet (20 mg total) by mouth at bedtime.    Dispense:  30 tablet    Refill:  0   metFORMIN (GLUCOPHAGE) 500 MG  tablet    Sig: 1/2 tab po * 6 d, then 1 po qd with lunch    Dispense:  180 tablet    Refill:  3    30 d supply;  ** OV for RF **   Do not send RF request     1. NEW ONSET-  Type 2 diabetes mellitus with other specified complication, without long-term current use of insulin (HCC) Angela Abbott agreed to start metformin 500 mg 1/2 tablet with lunch for 6 days, and then increase to 1 tablet PO daily. We will recheck her A1c today.  Handouts were given and extensive diagnosis counseling was done. I recommended the patient to follow up with her primary care physician to discuss her new diagnosis.   - metFORMIN (GLUCOPHAGE) 500 MG tablet; 1/2 tab po * 6 d, then 1 po qd with lunch  Dispense: 180 tablet; Refill: 3 - Hemoglobin A1c  2. Hypertension associated with diabetes ([place]) Angela Abbott's blood pressure goal is 130/80 or less, and we will continue to follow closely. She will decrease her salt intake, and will continue her prudent nutritional plan and weight loss.  3. Mixed diabetic hyperlipidemia associated with type 2 diabetes mellitus ([place]) Angela Abbott will continue her prudent nutritional plan, and she agreed to increase Lipitor to 20 mg qhs (up from 10 mg). Extensive counseling was done today.  - atorvastatin (LIPITOR) 20 MG tablet; Take 1 tablet (20 mg total) by mouth at bedtime.  Dispense: 30 tablet; Refill: 0  4. Hypothyroidism, unspecified type Angela Abbott will continue her medications per her primary care physician. Orders and follow up as documented in patient record.  Counseling Good thyroid control is important for overall health. Supratherapeutic thyroid levels are dangerous and will not improve weight loss results. Counseling: The correct way to take levothyroxine is fasting, with water, separated by at least 30 minutes from breakfast, and separated by more than 4 hours from calcium, iron, multivitamins, acid reflux medications (PPIs).   5. Vitamin D deficiency Angela Abbott will continue OTC Vitamin D 5,000 IU daily, and she will follow-up for routine testing of Vitamin D, at least 2-3 times per year to avoid over-replacement.  6. Iron deficiency anemia secondary to inadequate dietary iron intake Angela Abbott will continue her prudent nutritional plan, and she will follow up with her primary care physician as planned.Orders and follow up as documented in patient record.  Counseling Iron is essential for our bodies to make red  blood cells.  Reasons that someone may be deficient include: an iron-deficient diet (more likely in those following vegan or vegetarian diets), women with heavy menses, patients with GI disorders or poor absorption, patients that have had bariatric surgery, frequent blood donors, patients with cancer, and patients with heart disease.   Iron-rich foods include dark leafy greens, red and white meats, eggs, seafood, and beans.   Certain foods and drinks prevent your body from absorbing iron properly. Avoid eating these foods in the same meal as iron-rich foods or with iron supplements. These foods include: coffee, black tea, and red wine; milk, dairy products, and foods that are high in calcium; beans and soybeans; whole grains.  Constipation can be a side effect of iron supplementation. Increased water and fiber intake are helpful. Water goal: > 2 liters/day. Fiber goal: > 25 grams/day.  7. At risk for deficient knowledge of diabetes mellitus Dondi was given approximately 23 minutes of diabetes education and counseling today. We discussed intensive lifestyle modifications today with an emphasis on weight loss as well  as increasing exercise and decreasing simple carbohydrates in her diet. We also reviewed medication options with an emphasis on risk versus benefit of those discussed.    8. Obesity with current BMI of 42.4 Elyn is currently in the action stage of change. As such, her goal is to continue with weight loss efforts. She has agreed to the Category 2 Plan.   We will review A1c at her next office visit. Handouts on the meal plan was give to the patient, and I again reviewed it with the patient and all questions were answered.   Exercise goals: As is.  Behavioral modification strategies: increasing lean protein intake, decreasing simple carbohydrates, decreasing eating out, and avoiding temptations.  Katleen has agreed to follow-up with our clinic in 2 weeks. She was informed of the  importance of frequent follow-up visits to maximize her success with intensive lifestyle modifications for her multiple health conditions.   Objective:   Blood pressure 138/84, pulse 81, temperature 98 F (36.7 C), height 5\' 6"  (1.676 m), weight 262 lb (118.8 kg), SpO2 94 %. Body mass index is 42.29 kg/m.  General: Cooperative, alert, well developed, in no acute distress. HEENT: Conjunctivae and lids unremarkable. Cardiovascular: Regular rhythm.  Lungs: Normal work of breathing. Neurologic: No focal deficits.   Lab Results  Component Value Date   CREATININE 0.72 08/08/2020   BUN 12 08/08/2020   NA 142 08/08/2020   K 5.0 08/08/2020   CL 102 08/08/2020   CO2 26 08/08/2020   Lab Results  Component Value Date   ALT 35 (H) 08/08/2020   AST 22 08/08/2020   ALKPHOS 101 08/08/2020   BILITOT 0.5 08/08/2020   Lab Results  Component Value Date   HGBA1C 6.3 (H) 01/25/2021   HGBA1C 6.7 (H) 08/08/2020   HGBA1C 5.9 (H) 10/28/2017   Lab Results  Component Value Date   INSULIN 16.2 08/08/2020   Lab Results  Component Value Date   TSH 1.210 08/08/2020   Lab Results  Component Value Date   CHOL 154 08/08/2020   HDL 42 08/08/2020   LDLCALC 93 08/08/2020   TRIG 105 08/08/2020   CHOLHDL 2.4 10/28/2017   Lab Results  Component Value Date   VD25OH 53.7 08/08/2020   Lab Results  Component Value Date   WBC 6.6 08/08/2020   HGB 13.6 08/08/2020   HCT 43.1 08/08/2020   MCV 88 08/08/2020   PLT 298 08/08/2020   Lab Results  Component Value Date   IRON 52 03/10/2007   FERRITIN 38.9 03/10/2007   Attestation Statements:   Reviewed by clinician on day of visit: allergies, medications, problem list, medical history, surgical history, family history, social history, and previous encounter notes.   Wilhemena Durie, am acting as transcriptionist for [employer], DO.  I have reviewed the above documentation for accuracy and completeness, and I agree with the above. Marjory Sneddon, D.O.  The [place] was signed into law in 2016 which includes the topic of electronic health records.  This provides immediate access to information in MyChart.  This includes consultation notes, operative notes, office notes, lab results and pathology reports.  If you have any questions about what you read please let us know at your next visit so we can discuss your concerns and take corrective action if need be.  We are right here with you.

## 2021-02-08 ENCOUNTER — Ambulatory Visit (INDEPENDENT_AMBULATORY_CARE_PROVIDER_SITE_OTHER): Payer: 59 | Admitting: Physician Assistant

## 2021-02-08 ENCOUNTER — Encounter (INDEPENDENT_AMBULATORY_CARE_PROVIDER_SITE_OTHER): Payer: Self-pay | Admitting: Physician Assistant

## 2021-02-08 ENCOUNTER — Other Ambulatory Visit: Payer: Self-pay

## 2021-02-08 VITALS — BP 129/75 | HR 65 | Temp 98.3°F | Ht 66.0 in | Wt 261.0 lb

## 2021-02-08 DIAGNOSIS — Z7984 Long term (current) use of oral hypoglycemic drugs: Secondary | ICD-10-CM

## 2021-02-08 DIAGNOSIS — Z6841 Body Mass Index (BMI) 40.0 and over, adult: Secondary | ICD-10-CM

## 2021-02-08 DIAGNOSIS — E669 Obesity, unspecified: Secondary | ICD-10-CM | POA: Diagnosis not present

## 2021-02-08 DIAGNOSIS — E119 Type 2 diabetes mellitus without complications: Secondary | ICD-10-CM

## 2021-02-08 NOTE — Progress Notes (Signed)
Chief Complaint:   OBESITY Delma is here to discuss her progress with her obesity treatment plan along with follow-up of her obesity related diagnoses. Hibo is on the Category 2 Plan and states she is following her eating plan approximately 60% of the time. Dejanira states she is walking 30 minutes 5 times per week.  Today's visit was #: 3 Starting weight: 264 lbs Starting date: 08/08/2020 Today's weight: 261 lbs Today's date: 02/08/2021 Total lbs lost to date: 3 Total lbs lost since last in-office visit: 1  Interim History: Pt is not weighing her protein. She is not eating bread and has cut down on her sweets. Pt reports sweets cravings after dinner. Starting next week, she will start water aerobics in addition to walking her dog.  Subjective:   1. Type 2 diabetes mellitus without complication, without long-term current use of insulin (HCC) Pt is on Metformin daily at lunch. She reports cravings after dinner. Her last A1c was 6.3, improved from 6.7. She is walking 5 times a week.  Assessment/Plan:   1. Type 2 diabetes mellitus without complication, without long-term current use of insulin (HCC) Good blood sugar control is important to decrease the likelihood of diabetic complications such as nephropathy, neuropathy, limb loss, blindness, coronary artery disease, and death. Intensive lifestyle modification including diet, exercise and weight loss are the first line of treatment for diabetes. Continue Metformin.  2. Obesity with current BMI of 42.15 Drianna is currently in the action stage of change. As such, her goal is to continue with weight loss efforts. She has agreed to the Category 2 Plan.   Exercise goals:  As is  Behavioral modification strategies: increasing lean protein intake and meal planning and cooking strategies.  Ruvi has agreed to follow-up with our clinic in 2 weeks. She was informed of the importance of frequent follow-up visits to maximize her success  with intensive lifestyle modifications for her multiple health conditions.   Objective:   Blood pressure 129/75, pulse 65, temperature 98.3 F (36.8 C), height 5\' 6"  (1.676 m), weight 261 lb (118.4 kg), SpO2 93 %. Body mass index is 42.13 kg/m.  General: Cooperative, alert, well developed, in no acute distress. HEENT: Conjunctivae and lids unremarkable. Cardiovascular: Regular rhythm.  Lungs: Normal work of breathing. Neurologic: No focal deficits.   Lab Results  Component Value Date   CREATININE 0.72 08/08/2020   BUN 12 08/08/2020   NA 142 08/08/2020   K 5.0 08/08/2020   CL 102 08/08/2020   CO2 26 08/08/2020   Lab Results  Component Value Date   ALT 35 (H) 08/08/2020   AST 22 08/08/2020   ALKPHOS 101 08/08/2020   BILITOT 0.5 08/08/2020   Lab Results  Component Value Date   HGBA1C 6.3 (H) 01/25/2021   HGBA1C 6.7 (H) 08/08/2020   HGBA1C 5.9 (H) 10/28/2017   Lab Results  Component Value Date   INSULIN 16.2 08/08/2020   Lab Results  Component Value Date   TSH 1.210 08/08/2020   Lab Results  Component Value Date   CHOL 154 08/08/2020   HDL 42 08/08/2020   LDLCALC 93 08/08/2020   TRIG 105 08/08/2020   CHOLHDL 2.4 10/28/2017   Lab Results  Component Value Date   VD25OH 53.7 08/08/2020   Lab Results  Component Value Date   WBC 6.6 08/08/2020   HGB 13.6 08/08/2020   HCT 43.1 08/08/2020   MCV 88 08/08/2020   PLT 298 08/08/2020   Lab Results  Component  Value Date   IRON 52 03/10/2007   FERRITIN 38.9 03/10/2007   Attestation Statements:   Reviewed by clinician on day of visit: allergies, medications, problem list, medical history, surgical history, family history, social history, and previous encounter notes.  Time spent on visit including pre-visit chart review and post-visit care and charting was 30 minutes.   Coral Ceo, CMA, am acting as transcriptionist for [employer], PA-C.  I have reviewed the above documentation for accuracy and  completeness, and I agree with the above. Abby Potash, PA-C

## 2021-02-21 ENCOUNTER — Encounter (INDEPENDENT_AMBULATORY_CARE_PROVIDER_SITE_OTHER): Payer: Self-pay

## 2021-02-22 ENCOUNTER — Ambulatory Visit (INDEPENDENT_AMBULATORY_CARE_PROVIDER_SITE_OTHER): Payer: 59 | Admitting: Bariatrics

## 2021-02-22 ENCOUNTER — Encounter (INDEPENDENT_AMBULATORY_CARE_PROVIDER_SITE_OTHER): Payer: Self-pay | Admitting: Bariatrics

## 2021-02-22 ENCOUNTER — Other Ambulatory Visit: Payer: Self-pay

## 2021-02-22 ENCOUNTER — Ambulatory Visit (INDEPENDENT_AMBULATORY_CARE_PROVIDER_SITE_OTHER): Payer: 59 | Admitting: Physician Assistant

## 2021-02-22 VITALS — BP 132/77 | HR 68 | Temp 98.1°F | Ht 66.0 in | Wt 260.0 lb

## 2021-02-22 DIAGNOSIS — E669 Obesity, unspecified: Secondary | ICD-10-CM | POA: Diagnosis not present

## 2021-02-22 DIAGNOSIS — I152 Hypertension secondary to endocrine disorders: Secondary | ICD-10-CM

## 2021-02-22 DIAGNOSIS — Z7984 Long term (current) use of oral hypoglycemic drugs: Secondary | ICD-10-CM

## 2021-02-22 DIAGNOSIS — E1169 Type 2 diabetes mellitus with other specified complication: Secondary | ICD-10-CM | POA: Diagnosis not present

## 2021-02-22 DIAGNOSIS — E1159 Type 2 diabetes mellitus with other circulatory complications: Secondary | ICD-10-CM | POA: Diagnosis not present

## 2021-02-22 DIAGNOSIS — Z6841 Body Mass Index (BMI) 40.0 and over, adult: Secondary | ICD-10-CM

## 2021-02-22 MED ORDER — METFORMIN HCL 500 MG PO TABS: ORAL_TABLET | ORAL | 0 refills | Status: AC

## 2021-02-22 NOTE — Progress Notes (Signed)
Chief Complaint:   OBESITY Angela Abbott is here to discuss her progress with her obesity treatment plan along with follow-up of her obesity related diagnoses. Angela Abbott is on the Category 2 Plan and states she is following her eating plan approximately 80-90% of the time. Angela Abbott states she is walking for 30 minutes 5 times per week and water resistance for 30 minutes 5 times per week.  Today's visit was #: 4 Starting weight: 264 lbs Starting date: 08/08/2020 Today's weight: 260 lbs Today's date: 02/22/2021 Total lbs lost to date: 4 lbs Total lbs lost since last in-office visit: 1 lb  Interim History: Angela Abbott is down 1 lb since her last visit. She is trying "so hard".   Subjective:   1. NEW ONSET-  Type 2 diabetes mellitus with other specified complication, without long-term current use of insulin (HCC) Angela Abbott is taking Metformin currently.  2. Hypertension associated with diabetes ([place]) Angela Abbott is currently taking Cardizem CD.   Assessment/Plan:   1. NEW ONSET-  Type 2 diabetes mellitus with other specified complication, without long-term current use of insulin (HCC) We will refill Metformin 500 mg for 1 month with no refills. Good blood sugar control is important to decrease the likelihood of diabetic complications such as nephropathy, neuropathy, limb loss, blindness, coronary artery disease, and death. Intensive lifestyle modification including diet, exercise and weight loss are the first line of treatment for diabetes.   - metFORMIN (GLUCOPHAGE) 500 MG tablet; 1 po qd at night.  Dispense: 30 tablet; Refill: 0  2. Hypertension associated with diabetes ([place]) Angela Abbott will continue taking Cardizem CD. She is working on healthy weight loss and exercise to improve blood pressure control. We will watch for signs of hypotension as she continues her lifestyle modifications.  3. Obesity with current BMI of 42.15 Angela Abbott is currently in the action stage of change. As such, her goal is to  continue with weight loss efforts. She has agreed to the Category 2 Plan.   Angela Abbott will continue meal planning and she will continue intentional eating. She will keep her water intake high. She will make better choices.  Exercise goals:  As is.  Behavioral modification strategies: increasing lean protein intake, decreasing simple carbohydrates, increasing vegetables, increasing water intake, decreasing eating out, no skipping meals, meal planning and cooking strategies, keeping healthy foods in the home, and planning for success.  Angela Abbott has agreed to follow-up with our clinic in 2 weeks with Dr. Raliegh Scarlet (fasting). She was informed of the importance of frequent follow-up visits to maximize her success with intensive lifestyle modifications for her multiple health conditions.   Objective:   Blood pressure 132/77, pulse 68, temperature 98.1 F (36.7 C), height 5\' 6"  (1.676 m), weight 260 lb (117.9 kg), SpO2 95 %. Body mass index is 41.97 kg/m.  General: Cooperative, alert, well developed, in no acute distress. HEENT: Conjunctivae and lids unremarkable. Cardiovascular: Regular rhythm.  Lungs: Normal work of breathing. Neurologic: No focal deficits.   Lab Results  Component Value Date   CREATININE 0.72 08/08/2020   BUN 12 08/08/2020   NA 142 08/08/2020   K 5.0 08/08/2020   CL 102 08/08/2020   CO2 26 08/08/2020   Lab Results  Component Value Date   ALT 35 (H) 08/08/2020   AST 22 08/08/2020   ALKPHOS 101 08/08/2020   BILITOT 0.5 08/08/2020   Lab Results  Component Value Date   HGBA1C 6.3 (H) 01/25/2021   HGBA1C 6.7 (H) 08/08/2020   HGBA1C 5.9 (H)  10/28/2017   Lab Results  Component Value Date   INSULIN 16.2 08/08/2020   Lab Results  Component Value Date   TSH 1.210 08/08/2020   Lab Results  Component Value Date   CHOL 154 08/08/2020   HDL 42 08/08/2020   LDLCALC 93 08/08/2020   TRIG 105 08/08/2020   CHOLHDL 2.4 10/28/2017   Lab Results  Component Value Date    VD25OH 53.7 08/08/2020   Lab Results  Component Value Date   WBC 6.6 08/08/2020   HGB 13.6 08/08/2020   HCT 43.1 08/08/2020   MCV 88 08/08/2020   PLT 298 08/08/2020   Lab Results  Component Value Date   IRON 52 03/10/2007   FERRITIN 38.9 03/10/2007   Attestation Statements:   Reviewed by clinician on day of visit: allergies, medications, problem list, medical history, surgical history, family history, social history, and previous encounter notes.  I, Lizbeth Bark, RMA, am acting as Location manager for [employer], DO.  I have reviewed the above documentation for accuracy and completeness, and I agree with the above. Jearld Lesch, DO

## 2021-02-26 ENCOUNTER — Encounter (INDEPENDENT_AMBULATORY_CARE_PROVIDER_SITE_OTHER): Payer: Self-pay | Admitting: Bariatrics

## 2021-03-08 ENCOUNTER — Ambulatory Visit (INDEPENDENT_AMBULATORY_CARE_PROVIDER_SITE_OTHER): Payer: 59 | Admitting: Family Medicine

## 2021-03-12 ENCOUNTER — Ambulatory Visit (INDEPENDENT_AMBULATORY_CARE_PROVIDER_SITE_OTHER): Payer: 59 | Admitting: Adult Health

## 2021-03-14 ENCOUNTER — Ambulatory Visit
Admission: EM | Admit: 2021-03-14 | Discharge: 2021-03-14 | Disposition: A | Discharge status: Home / Self Care | Payer: 59 | Attending: Physician Assistant | Admitting: Physician Assistant

## 2021-03-14 ENCOUNTER — Other Ambulatory Visit: Payer: Self-pay

## 2021-03-14 DIAGNOSIS — M5442 Lumbago with sciatica, left side: Secondary | ICD-10-CM | POA: Diagnosis not present

## 2021-03-14 MED ORDER — CYCLOBENZAPRINE HCL 10 MG PO TABS: 10.0000 mg | ORAL_TABLET | Freq: Two times a day (BID) | ORAL | 0 refills | Status: DC | PRN

## 2021-03-14 MED ORDER — METHYLPREDNISOLONE SODIUM SUCC 125 MG IJ SOLR
80.0000 mg | Freq: Once | INTRAMUSCULAR | Status: AC
  Administered 2021-03-14: 80 mg via INTRAMUSCULAR

## 2021-03-14 NOTE — ED Triage Notes (Signed)
Pt reports sciatic nerve pain x 1 day. States it is the worst pain.  ?

## 2021-03-14 NOTE — ED Provider Notes (Signed)
[name]    CSN: [id number] Arrival date & time: 03/14/21  1656      History   Chief Complaint Chief Complaint  Patient presents with   Back Pain    HPI Angela Abbott is a 61 y.o. female.   Patient here today for evaluation of sciatic nerve pain that started a day ago.  She reports that today at work pain was worse.  Weightbearing also seems to worsen pain.  She reports most of her radiating pain will present at nighttime.  At this time she will have some radiating pain down her left leg.  She denies any other numbness or tingling.  The history is provided by the patient.  Back Pain Associated symptoms: no abdominal pain, no fever and no numbness    Past Medical History:  Diagnosis Date   Arthritis    shoulders, knees, hips   Back pain    Bilateral swelling of feet and ankles    Carotid artery occlusion    Dental crowns present    GERD (gastroesophageal reflux disease)    Hypertension    Hypothyroidism    Knee pain    right knee   Left knee pain 07/2014   Osteoarthritis    Other fatigue    Pneumonia    hx of 02/2017    Right knee pain    Shortness of breath on exertion    Stress incontinence    Stroke [facility])    speech difficulty recalling words   Wears partial dentures    upper and lower    Patient Active Problem List   Diagnosis Date Noted   Diabetes mellitus ([place]) 01/30/2021   Mixed diabetic hyperlipidemia associated with type 2 diabetes mellitus ([place]) 01/30/2021   Vitamin D deficiency 01/30/2021   Iron deficiency anemia secondary to inadequate dietary iron intake 01/30/2021   History of CVA (cerebrovascular accident) 11/14/2019   Class 3 severe obesity due to excess calories with serious comorbidity and body mass index (BMI) of 45.0 to 49.9 in adult [facility]) 11/14/2019   Hypercholesterolemia 08/19/2018   Hypertension associated with diabetes ([place]) 08/19/2018   Numbness 11/01/2017   TIA (transient ischemic attack) 11/01/2017   Sensory  disturbance 11/01/2017   Acute ischemic stroke ([place]) 11/01/2017   CVA (cerebral vascular accident) ([place]) 10/27/2017   Tobacco abuse 10/27/2017   Trigger middle finger of right hand 06/19/2017   Chest pain 02/14/2017   Multifocal pneumonia 02/14/2017   Hypothyroidism 02/14/2017   GERD (gastroesophageal reflux disease) 02/14/2017   Pneumonia 02/14/2017   Cigarette smoker 11/13/2016   Chronic cough 11/12/2016   Mallet deformity of left ring finger 08/22/2015   Sciatica of left side 05/18/2013   Arthritis of wrist, left, degenerative 03/30/2013   History of laparoscopic adjustable gastric banding, 02/17/2008 04/29/2012   Morbid obesity ([place]) 01/16/2012   PLANTAR FACIITIS 07/01/2007   KNEE PAIN 04/29/2007   TEAR MEDIAL MENISCUS 04/29/2007    Past Surgical History:  Procedure Laterality Date   ABDOMINAL HYSTERECTOMY     partial   CAROTID ENDARTERECTOMY     [place], 1998   ENDARTERECTOMY Right 11/05/2017   Procedure: ENDARTERECTOMY CAROTID RIGHT;  Surgeon: [name], MD;  Location: [facility];  Service: Vascular;  Laterality: Right;   ESOPHAGOGASTRODUODENOSCOPY N/A 11/26/2012   Procedure: ESOPHAGOGASTRODUODENOSCOPY (EGD);  Surgeon: Shann Medal, MD;  Location: [facility];  Service: General;  Laterality: N/A;   GANGLION CYST EXCISION Right 02/2008   foot   KNEE ARTHROSCOPY  WITH LATERAL MENISECTOMY Left 07/21/2014   Procedure: LEFT KNEE ARTHROSCOPY,  CHONDROPLASTY, WITH LATERAL AND MEDIAL  MENISCECTOMIES;  Surgeon: Kathryne Hitch, MD;  Location: [place];  Service: Orthopedics;  Laterality: Left;   KNEE ARTHROSCOPY WITH MEDIAL MENISECTOMY Left 07/21/2014   Procedure: KNEE ARTHROSCOPY WITH MEDIAL MENISECTOMY;  Surgeon: Kathryne Hitch, MD;  Location: [place];  Service: Orthopedics;  Laterality: Left;   LAPAROSCOPIC GASTRIC BANDING  02/18/2008   LAPAROSCOPIC GASTRIC BANDING N/A 2010   LAPAROSCOPIC REPAIR AND REMOVAL OF GASTRIC BAND N/A  03/21/2017   Procedure: LAPAROSCOPIC REPAIR AND REMOVAL OF GASTRIC BAND;  Surgeon: [name], MD;  Location: [facility];  Service: General;  Laterality: N/A;   PARTIAL HYSTERECTOMY  09/07/2002   PATCH ANGIOPLASTY Right 11/05/2017   Procedure: PATCH ANGIOPLASTY of right carotid artery using xenosure bovine pericardium patch;  Surgeon: [name], MD;  Location: [place] [facility];  Service: Vascular;  Laterality: Right;   SHOULDER ARTHROSCOPY WITH SUBACROMIAL DECOMPRESSION, ROTATOR CUFF REPAIR AND BICEP TENDON REPAIR Right 11/23/2015   Procedure: RIGHT SHOULDER ARTHROSCOPY WITH DEBRIDEMENT, SUBACROMIAL DECOMPRESSION, DISTAL CLAVICLE EXCISION, ROTATOR CUFF REPAIR AND BICEP TENODESIS;  Surgeon: Ninetta Lights, MD;  Location: [place];  Service: Orthopedics;  Laterality: Right;    OB History     Gravida  3   Para  2   Term      Preterm      AB  1   Living         SAB      IAB      Ectopic      Multiple      Live Births               Home Medications    Prior to Admission medications   Medication Sig Start Date End Date Taking? Authorizing Provider  cyclobenzaprine (FLEXERIL) 10 MG tablet Take 1 tablet (10 mg total) by mouth 2 (two) times daily as needed for muscle spasms. 03/14/21  Yes Francene Finders, PA-C  aspirin 325 MG tablet Take 1 tablet (325 mg total) by mouth daily. 10/29/17   Hosie Poisson, MD  atorvastatin (LIPITOR) 20 MG tablet Take 1 tablet (20 mg total) by mouth at bedtime. 01/25/21   Opalski, Neoma Laming, DO  celecoxib (CELEBREX) 200 MG capsule Take 200 mg by mouth 2 (two) times daily.    [provider]  Cholecalciferol (VITAMIN D) 125 MCG (5000 UT) CAPS Take 125 mcg by mouth daily.    [provider]  citalopram (CELEXA) 10 MG tablet Take 10 mg by mouth daily. 02/11/19   [provider]  diltiazem (CARDIZEM CD) 180 MG 24 hr capsule Take 180 mg by mouth at bedtime.  10/31/17   [provider]  docusate sodium  (COLACE) 100 MG capsule Take 100 mg by mouth in the morning.    [provider]  Ferrous Sulfate Dried (HIGH POTENCY IRON) 65 MG TABS Take 1 tablet by mouth daily.    [provider]  gabapentin (NEURONTIN) 300 MG capsule Take 300 mg by mouth at bedtime. 02/11/19   [provider]  levothyroxine (SYNTHROID) 150 MCG tablet Take 150 mcg by mouth every morning. 02/16/19   [provider]  metFORMIN (GLUCOPHAGE) 500 MG tablet 1 po qd at night. 02/22/21   Jearld Lesch A, DO  omeprazole (PRILOSEC) 40 MG capsule Take 1 capsule (40 mg total) by mouth daily. 02/15/17   Kathie Dike, MD  Family History Family History  Problem Relation Age of Onset   CVA Mother    Hypertension Father    CVA Other    Diabetes Other    CAD Other    Cancer Neg Hx     Social History Social History   Tobacco Use   Smoking status: Former    Packs/day: 1.00    Years: 46.00    Pack years: 46.00    Types: Cigarettes    Quit date: 10/27/2017    Years since quitting: 3.3   Smokeless tobacco: Never  Vaping Use   Vaping Use: Never used  Substance Use Topics   Alcohol use: Yes    Comment: occasionally   Drug use: No     Allergies   Bee venom   Review of Systems Review of Systems  Constitutional:  Negative for chills and fever.  Eyes:  Negative for discharge and redness.  Gastrointestinal:  Negative for abdominal pain, nausea and vomiting.  Musculoskeletal:  Positive for back pain.  Neurological:  Negative for numbness.    Physical Exam Triage Vital Signs ED Triage Vitals  Enc Vitals Group     BP 03/14/21 1752 (!) 174/92     Pulse Rate 03/14/21 1750 68     Resp 03/14/21 1750 16     Temp 03/14/21 1750 98.1 F (36.7 C)     Temp Source 03/14/21 1750 Oral     SpO2 03/14/21 1750 94 %     Weight --      Height --      Head Circumference --      Peak Flow --      Pain Score 03/14/21 1749 10     Pain Loc --      Pain Edu? --      Excl. in [place]? --    No data  found.  Updated Vital Signs BP (!) 174/92    Pulse 68    Temp 98.1 F (36.7 C) (Oral)    Resp 16    SpO2 94%     Physical Exam Vitals and nursing note reviewed.  Constitutional:      General: She is not in acute distress.    Appearance: Normal appearance. She is not ill-appearing.  HENT:     Head: Normocephalic and atraumatic.  Eyes:     Conjunctiva/sclera: Conjunctivae normal.  Cardiovascular:     Rate and Rhythm: Normal rate.  Pulmonary:     Effort: Pulmonary effort is normal.  Musculoskeletal:     Comments: No tenderness to palpation noted to midline spine or across low back.  Neurological:     Mental Status: She is alert.  Psychiatric:        Mood and Affect: Mood normal.        Behavior: Behavior normal.        Thought Content: Thought content normal.     UC Treatments / Results  Labs (all labs ordered are listed, but only abnormal results are displayed) Labs Reviewed - No data to display  EKG   Radiology No results found.  Procedures Procedures (including critical care time)  Medications Ordered in UC Medications  methylPREDNISolone sodium succinate (SOLU-MEDROL) 125 mg/2 mL injection 80 mg (80 mg Intramuscular Given 03/14/21 1912)    Initial Impression / Assessment and Plan / UC Course  I have reviewed the triage vital signs and the nursing notes.  Pertinent labs & imaging results that were available during my care of the patient were reviewed  by me and considered in my medical decision making (see chart for details).    Will treat to cover sciatica with steroid injection and muscle relaxer. Encouraged follow up if no improvement or if symptoms worsen in any way.   Final Clinical Impressions(s) / UC Diagnoses   Final diagnoses:  Acute left-sided low back pain with left-sided sciatica   Discharge Instructions   None    ED Prescriptions     Medication Sig Dispense Auth. Provider   cyclobenzaprine (FLEXERIL) 10 MG tablet Take 1 tablet (10 mg  total) by mouth 2 (two) times daily as needed for muscle spasms. 20 tablet Francene Finders, PA-C      PDMP not reviewed this encounter.   Francene Finders, PA-C 03/14/21 1923

## 2021-03-20 ENCOUNTER — Other Ambulatory Visit (HOSPITAL_COMMUNITY): Payer: Self-pay | Admitting: Internal Medicine

## 2021-03-20 ENCOUNTER — Other Ambulatory Visit: Payer: Self-pay | Admitting: Internal Medicine

## 2021-03-20 DIAGNOSIS — M5136 Other intervertebral disc degeneration, lumbar region: Secondary | ICD-10-CM

## 2021-03-20 DIAGNOSIS — M5416 Radiculopathy, lumbar region: Secondary | ICD-10-CM

## 2021-03-22 ENCOUNTER — Encounter (INDEPENDENT_AMBULATORY_CARE_PROVIDER_SITE_OTHER): Payer: Self-pay

## 2021-03-26 ENCOUNTER — Ambulatory Visit (INDEPENDENT_AMBULATORY_CARE_PROVIDER_SITE_OTHER): Payer: 59 | Admitting: Nurse Practitioner

## 2021-04-05 ENCOUNTER — Ambulatory Visit (HOSPITAL_COMMUNITY)
Admission: RE | Admit: 2021-04-05 | Discharge: 2021-04-05 | Disposition: A | Discharge status: Home / Self Care | Payer: 59 | Source: Ambulatory Visit | Attending: Internal Medicine | Admitting: Internal Medicine

## 2021-04-05 DIAGNOSIS — M5416 Radiculopathy, lumbar region: Secondary | ICD-10-CM | POA: Insufficient documentation

## 2021-04-05 DIAGNOSIS — M5136 Other intervertebral disc degeneration, lumbar region: Secondary | ICD-10-CM | POA: Diagnosis present

## 2021-04-16 ENCOUNTER — Other Ambulatory Visit (HOSPITAL_COMMUNITY)
Admission: RE | Admit: 2021-04-16 | Discharge: 2021-04-16 | Disposition: A | Discharge status: Home / Self Care | Payer: 59 | Source: Ambulatory Visit | Attending: Obstetrics and Gynecology | Admitting: Obstetrics and Gynecology

## 2021-04-16 ENCOUNTER — Encounter: Payer: Self-pay | Admitting: Obstetrics and Gynecology

## 2021-04-16 ENCOUNTER — Ambulatory Visit: Payer: 59 | Admitting: Obstetrics and Gynecology

## 2021-04-16 VITALS — BP 129/84 | HR 84 | Ht 66.25 in | Wt 268.0 lb

## 2021-04-16 DIAGNOSIS — R319 Hematuria, unspecified: Secondary | ICD-10-CM

## 2021-04-16 DIAGNOSIS — R350 Frequency of micturition: Secondary | ICD-10-CM | POA: Insufficient documentation

## 2021-04-16 DIAGNOSIS — N393 Stress incontinence (female) (male): Secondary | ICD-10-CM | POA: Diagnosis not present

## 2021-04-16 DIAGNOSIS — R351 Nocturia: Secondary | ICD-10-CM

## 2021-04-16 DIAGNOSIS — N3281 Overactive bladder: Secondary | ICD-10-CM

## 2021-04-16 LAB — POCT URINALYSIS DIPSTICK
Appearance: ABNORMAL
Bilirubin, UA: NEGATIVE
Glucose, UA: NEGATIVE
Ketones, UA: NEGATIVE
Leukocytes, UA: NEGATIVE
Nitrite, UA: NEGATIVE
Protein, UA: NEGATIVE
Spec Grav, UA: >=1.03 — AB (ref 1.010–1.025)
Urobilinogen, UA: 0.2 E.U./dL
pH, UA: 5.5 (ref 5.0–8.0)

## 2021-04-16 MED ORDER — VIBEGRON 75 MG PO TABS: 75.0000 mg | ORAL_TABLET | Freq: Every day | ORAL | 5 refills | Status: DC
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
## 2021-04-16 NOTE — Patient Instructions (Signed)

## 2021-04-17 LAB — URINE CULTURE: Culture: NO GROWTH

## 2021-05-29 ENCOUNTER — Ambulatory Visit (INDEPENDENT_AMBULATORY_CARE_PROVIDER_SITE_OTHER): Payer: 59 | Admitting: Obstetrics and Gynecology

## 2021-05-29 ENCOUNTER — Encounter: Payer: Self-pay | Admitting: Obstetrics and Gynecology

## 2021-05-29 VITALS — BP 130/83 | HR 71

## 2021-05-29 DIAGNOSIS — R319 Hematuria, unspecified: Secondary | ICD-10-CM | POA: Diagnosis not present

## 2021-05-29 DIAGNOSIS — N3281 Overactive bladder: Secondary | ICD-10-CM | POA: Diagnosis not present

## 2021-05-29 LAB — URINALYSIS, ROUTINE W REFLEX MICROSCOPIC
Bilirubin Urine: NEGATIVE
Glucose, UA: NEGATIVE mg/dL
Hgb urine dipstick: NEGATIVE
Ketones, ur: 5 mg/dL — AB
Leukocytes,Ua: NEGATIVE
Nitrite: NEGATIVE
Protein, ur: 30 mg/dL — AB
Specific Gravity, Urine: 1.029 (ref 1.005–1.030)
pH: 6 (ref 5.0–8.0)

## 2021-05-29 MED ORDER — VIBEGRON 75 MG PO TABS: 75.0000 mg | ORAL_TABLET | Freq: Every day | ORAL | 11 refills | Status: DC

## 2021-05-29 NOTE — Progress Notes (Signed)
[facility] Return Visit  SUBJECTIVE  History of Present Illness: Angela Abbott is a 61 y.o. female seen in follow-up for overactive bladder. Plan at last visit was to start [facility] '75mg'$  daily.   She feels the medication is a "miracle". She has not had any urinary leakage. She is able to go several hours between voids during the day. Not waking up at night to go to the bathroom. Has not needed to cut off fluids.   Medication also has been covered by her insurance.   Last visit UA micro was sent due to small blood on POC urine, but it did not result.   Past Medical History: Patient  has a past medical history of Arthritis, Back pain, Bilateral swelling of feet and ankles, Carotid artery occlusion, Dental crowns present, GERD (gastroesophageal reflux disease), Hypertension, Hypothyroidism, Knee pain, Left knee pain (07/2014), Osteoarthritis, Other fatigue, Pneumonia, Right knee pain, Shortness of breath on exertion, Stress incontinence, Stroke ([place]), and Wears partial dentures.   Past Surgical History: She  has a past surgical history that includes Cesarean section ([place]); Partial hysterectomy (09/07/2002); Laparoscopic gastric banding (02/18/2008); Ganglion cyst excision (Right, 02/2008); Esophagogastroduodenoscopy (N/A, 11/26/2012); Knee arthroscopy with lateral menisectomy (Left, 07/21/2014); Knee arthroscopy with medial menisectomy (Left, 07/21/2014); Abdominal hysterectomy; Shoulder arthroscopy with subacromial decompression, rotator cuff repair and bicep tendon repair (Right, 11/23/2015); Laparoscopic repair and removal of gastric band (N/A, 03/21/2017); Endarterectomy (Right, 11/05/2017); Patch angioplasty (Right, 11/05/2017); Carotid endarterectomy; and Laparoscopic gastric banding (N/A, 2010).   Medications: She has a current medication list which includes the following prescription(s): aspirin, celecoxib, vitamin d, citalopram, cyclobenzaprine, diltiazem, docusate  sodium, high potency iron, gabapentin, levothyroxine, metformin, omeprazole, and vibegron.   Allergies: Patient is allergic to bee venom.   Social History: Patient  reports that she quit smoking about 3 years ago. Her smoking use included cigarettes. She has a 46.00 pack-year smoking history. She has never used smokeless tobacco. She reports current alcohol use. She reports that she does not use drugs.      OBJECTIVE     Physical Exam: Vitals:   05/29/21 1535  BP: 130/83  Pulse: 71   Gen: No apparent distress, A&O x 3.  Detailed Urogynecologic Evaluation:  Deferred.    ASSESSMENT AND PLAN    Angela Abbott is a 61 y.o. with:  1. OAB (overactive bladder)   2. Hematuria, unspecified type    - Will continue Gemtesa '75mg'$ , refills provided - Will repeat microscopic UA to r/o blood in urine. We discussed that if blood is present then will need appropriate workup.   Follow up 1 year or sooner if needed  Jaquita Folds, MD  Time spent: I spent 20 minutes dedicated to the care of this patient on the date of this encounter to include pre-visit review of records, face-to-face time with the patient and post visit documentation.

## 2021-06-24 ENCOUNTER — Other Ambulatory Visit: Payer: Self-pay

## 2021-06-24 ENCOUNTER — Emergency Department (HOSPITAL_COMMUNITY): Payer: 59

## 2021-06-24 ENCOUNTER — Encounter (HOSPITAL_COMMUNITY): Payer: Self-pay

## 2021-06-24 ENCOUNTER — Observation Stay (HOSPITAL_COMMUNITY)
Admission: EM | Admit: 2021-06-24 | Discharge: 2021-06-25 | Disposition: A | Discharge status: Home / Self Care | Payer: 59 | Attending: Internal Medicine | Admitting: Internal Medicine

## 2021-06-24 DIAGNOSIS — Z7982 Long term (current) use of aspirin: Secondary | ICD-10-CM | POA: Insufficient documentation

## 2021-06-24 DIAGNOSIS — G459 Transient cerebral ischemic attack, unspecified: Principal | ICD-10-CM

## 2021-06-24 DIAGNOSIS — Z20822 Contact with and (suspected) exposure to covid-19: Secondary | ICD-10-CM | POA: Diagnosis not present

## 2021-06-24 DIAGNOSIS — I10 Essential (primary) hypertension: Secondary | ICD-10-CM | POA: Diagnosis not present

## 2021-06-24 DIAGNOSIS — Z87891 Personal history of nicotine dependence: Secondary | ICD-10-CM | POA: Diagnosis not present

## 2021-06-24 DIAGNOSIS — E119 Type 2 diabetes mellitus without complications: Secondary | ICD-10-CM | POA: Diagnosis not present

## 2021-06-24 DIAGNOSIS — I639 Cerebral infarction, unspecified: Secondary | ICD-10-CM

## 2021-06-24 DIAGNOSIS — Z7984 Long term (current) use of oral hypoglycemic drugs: Secondary | ICD-10-CM | POA: Insufficient documentation

## 2021-06-24 DIAGNOSIS — Z9889 Other specified postprocedural states: Secondary | ICD-10-CM | POA: Insufficient documentation

## 2021-06-24 DIAGNOSIS — Z79899 Other long term (current) drug therapy: Secondary | ICD-10-CM | POA: Diagnosis not present

## 2021-06-24 DIAGNOSIS — Z6841 Body Mass Index (BMI) 40.0 and over, adult: Secondary | ICD-10-CM | POA: Diagnosis not present

## 2021-06-24 DIAGNOSIS — E785 Hyperlipidemia, unspecified: Secondary | ICD-10-CM

## 2021-06-24 DIAGNOSIS — E039 Hypothyroidism, unspecified: Secondary | ICD-10-CM

## 2021-06-24 DIAGNOSIS — R202 Paresthesia of skin: Secondary | ICD-10-CM | POA: Diagnosis present

## 2021-06-24 DIAGNOSIS — K219 Gastro-esophageal reflux disease without esophagitis: Secondary | ICD-10-CM

## 2021-06-24 LAB — DIFFERENTIAL
Abs Immature Granulocytes: 0.03 10*3/uL (ref 0.00–0.07)
Basophils Absolute: 0.1 10*3/uL (ref 0.0–0.1)
Basophils Relative: 1 %
Eosinophils Absolute: 0.2 10*3/uL (ref 0.0–0.5)
Eosinophils Relative: 2 %
Immature Granulocytes: 0 %
Lymphocytes Relative: 23 %
Lymphs Abs: 2.1 10*3/uL (ref 0.7–4.0)
Monocytes Absolute: 0.9 10*3/uL (ref 0.1–1.0)
Monocytes Relative: 10 %
Neutro Abs: 6.1 10*3/uL (ref 1.7–7.7)
Neutrophils Relative %: 64 %

## 2021-06-24 LAB — COMPREHENSIVE METABOLIC PANEL
ALT: 22 U/L (ref 0–44)
AST: 15 U/L (ref 15–41)
Albumin: 3.6 g/dL (ref 3.5–5.0)
Alkaline Phosphatase: 76 U/L (ref 38–126)
Anion gap: 7 (ref 5–15)
BUN: 20 mg/dL (ref 8–23)
CO2: 28 mmol/L (ref 22–32)
Calcium: 9.1 mg/dL (ref 8.9–10.3)
Chloride: 103 mmol/L (ref 98–111)
Creatinine, Ser: 0.91 mg/dL (ref 0.44–1.00)
GFR, Estimated: >60 mL/min (ref >60)
Glucose, Bld: 104 mg/dL — ABNORMAL HIGH (ref 70–99)
Potassium: 3.9 mmol/L (ref 3.5–5.1)
Sodium: 138 mmol/L (ref 135–145)
Total Bilirubin: 0.5 mg/dL (ref 0.3–1.2)
Total Protein: 7.1 g/dL (ref 6.5–8.1)

## 2021-06-24 LAB — PROTIME-INR
INR: 0.9 (ref 0.8–1.2)
Prothrombin Time: 12.2 seconds (ref 11.4–15.2)

## 2021-06-24 LAB — CBC
HCT: 43.1 % (ref 36.0–46.0)
Hemoglobin: 13.7 g/dL (ref 12.0–15.0)
MCH: 29.2 pg (ref 26.0–34.0)
MCHC: 31.8 g/dL (ref 30.0–36.0)
MCV: 91.9 fL (ref 80.0–100.0)
Platelets: 266 10*3/uL (ref 150–400)
RBC: 4.69 MIL/uL (ref 3.87–5.11)
RDW: 14.6 % (ref 11.5–15.5)
WBC: 9.4 10*3/uL (ref 4.0–10.5)
nRBC: 0 % (ref 0.0–0.2)

## 2021-06-24 LAB — APTT: aPTT: 28 seconds (ref 24–36)

## 2021-06-24 LAB — CBG MONITORING, ED: Glucose-Capillary: 136 mg/dL — ABNORMAL HIGH (ref 70–99)

## 2021-06-24 LAB — ETHANOL: Alcohol, Ethyl (B): <10 mg/dL (ref <10)

## 2021-06-24 MED ORDER — ASPIRIN 81 MG PO CHEW
324.0000 mg | CHEWABLE_TABLET | Freq: Once | ORAL | Status: AC
  Administered 2021-06-25: 324 mg via ORAL
  Filled 2021-06-24: qty 4

## 2021-06-24 MED ORDER — SODIUM CHLORIDE 0.9% FLUSH
3.0000 mL | Freq: Once | INTRAVENOUS | Status: AC
  Administered 2021-06-24: 3 mL via INTRAVENOUS

## 2021-06-24 NOTE — ED Notes (Signed)
[place] paged @ [phone]

## 2021-06-24 NOTE — Progress Notes (Signed)
Code stroke activated at 2259, patient already had CT and has returned.  Dr. Samuel Germany connected at 2309, reported negative NCCT head at that time as well.

## 2021-06-24 NOTE — ED Notes (Signed)
Patient transported to CT 

## 2021-06-24 NOTE — H&P (Incomplete)
History and Physical    Patient: Angela Abbott OQH:[id number] DOB: [birth year] DOA: 06/24/2021 DOS: the patient was seen and examined on 06/24/2021 PCP: Sharilyn Sites, MD  Patient coming from: Home  Chief Complaint:  Chief Complaint  Patient presents with  . Transient Ischemic Attack   HPI: Angela Abbott is a 61 y.o. female with medical history significant of ***  Review of Systems: {ROS_Text:26778} Past Medical History:  Diagnosis Date  . Arthritis    shoulders, knees, hips  . Back pain   . Bilateral swelling of feet and ankles   . Carotid artery occlusion   . Dental crowns present   . GERD (gastroesophageal reflux disease)   . Hypertension   . Hypothyroidism   . Knee pain    right knee  . Left knee pain 07/2014  . Osteoarthritis   . Other fatigue   . Pneumonia    hx of 02/2017   . Right knee pain   . Shortness of breath on exertion   . Stress incontinence   . Stroke [facility])    speech difficulty recalling words  . Wears partial dentures    upper and lower   Past Surgical History:  Procedure Laterality Date  . ABDOMINAL HYSTERECTOMY     partial  . CAROTID ENDARTERECTOMY    . [place]  . ENDARTERECTOMY Right 11/05/2017   Procedure: ENDARTERECTOMY CAROTID RIGHT;  Surgeon: [name], MD;  Location: [facility] OR;  Service: Vascular;  Laterality: Right;  . ESOPHAGOGASTRODUODENOSCOPY N/A 11/26/2012   Procedure: ESOPHAGOGASTRODUODENOSCOPY (EGD);  Surgeon: Shann Medal, MD;  Location: [facility];  Service: General;  Laterality: N/A;  . GANGLION CYST EXCISION Right 02/2008   foot  . KNEE ARTHROSCOPY WITH LATERAL MENISECTOMY Left 07/21/2014   Procedure: LEFT KNEE ARTHROSCOPY,  CHONDROPLASTY, WITH LATERAL AND MEDIAL  MENISCECTOMIES;  Surgeon: Kathryne Hitch, MD;  Location: [place];  Service: Orthopedics;  Laterality: Left;  . KNEE ARTHROSCOPY WITH MEDIAL MENISECTOMY Left 07/21/2014   Procedure: KNEE ARTHROSCOPY WITH MEDIAL  MENISECTOMY;  Surgeon: Kathryne Hitch, MD;  Location: [place];  Service: Orthopedics;  Laterality: Left;  . LAPAROSCOPIC GASTRIC BANDING  02/18/2008  . LAPAROSCOPIC GASTRIC BANDING N/A 2010  . LAPAROSCOPIC REPAIR AND REMOVAL OF GASTRIC BAND N/A 03/21/2017   Procedure: LAPAROSCOPIC REPAIR AND REMOVAL OF GASTRIC BAND;  Surgeon: [name], MD;  [facility];  Service: General;  Laterality: N/A;  . PARTIAL HYSTERECTOMY  09/07/2002  . PATCH ANGIOPLASTY Right 11/05/2017   Procedure: PATCH ANGIOPLASTY of right carotid artery using xenosure bovine pericardium patch;  Surgeon: [name], MD;  Location: [place] OR;  Service: Vascular;  Laterality: Right;  . SHOULDER ARTHROSCOPY WITH SUBACROMIAL DECOMPRESSION, ROTATOR CUFF REPAIR AND BICEP TENDON REPAIR Right 11/23/2015   Procedure: RIGHT SHOULDER ARTHROSCOPY WITH DEBRIDEMENT, SUBACROMIAL DECOMPRESSION, DISTAL CLAVICLE EXCISION, ROTATOR CUFF REPAIR AND BICEP TENODESIS;  Surgeon: Ninetta Lights, MD;  Location: [place];  Service: Orthopedics;  Laterality: Right;   Social History:  reports that she quit smoking about 3 years ago. Her smoking use included cigarettes. She has a 46.00 pack-year smoking history. She has never used smokeless tobacco. She reports current alcohol use. She reports that she does not use drugs.  Allergies  Allergen Reactions  . Bee Venom Shortness Of Breath and Swelling    Family History  Problem Relation Age of Onset  . CVA Mother   . Hypertension Father   . CVA Other   .  Diabetes Other   . CAD Other   . Cancer Neg Hx     Prior to Admission medications   Medication Sig Start Date End Date Taking? Authorizing Provider  aspirin 325 MG tablet Take 1 tablet (325 mg total) by mouth daily. 10/29/17   Hosie Poisson, MD  celecoxib (CELEBREX) 200 MG capsule Take 200 mg by mouth 2 (two) times daily.    [provider]  Cholecalciferol (VITAMIN D) 125 MCG (5000 UT) CAPS Take 125  mcg by mouth daily.    [provider]  citalopram (CELEXA) 10 MG tablet Take 10 mg by mouth daily. 02/11/19   [provider]  cyclobenzaprine (FLEXERIL) 10 MG tablet Take 1 tablet (10 mg total) by mouth 2 (two) times daily as needed for muscle spasms. 03/14/21   Francene Finders, PA-C  diltiazem (CARDIZEM CD) 180 MG 24 hr capsule Take 180 mg by mouth at bedtime.  10/31/17   [provider]  docusate sodium (COLACE) 100 MG capsule Take 100 mg by mouth in the morning.    [provider]  Ferrous Sulfate Dried (HIGH POTENCY IRON) 65 MG TABS Take 1 tablet by mouth daily.    [provider]  gabapentin (NEURONTIN) 300 MG capsule Take 300 mg by mouth at bedtime. 02/11/19   [provider]  levothyroxine (SYNTHROID) 150 MCG tablet Take 150 mcg by mouth every morning. 02/16/19   [provider]  metFORMIN (GLUCOPHAGE) 500 MG tablet 1 po qd at night. 02/22/21   Jearld Lesch A, DO  omeprazole (PRILOSEC) 40 MG capsule Take 1 capsule (40 mg total) by mouth daily. 02/15/17   Kathie Dike, MD  Vibegron 75 MG TABS Take 75 mg by mouth daily. 05/29/21   Jaquita Folds, MD    Physical Exam: Vitals:   06/24/21 2215 06/24/21 2230 06/24/21 2255 06/24/21 2300  BP:   136/77 130/71  Pulse:  74 76   Resp:  18 15 (!) 22  Temp:      TempSrc:      SpO2:  96% 93%   Weight: 124.3 kg     Height: '5\' 6"'$  (1.676 m)      *** Data Reviewed: {Tip this will not be part of the note when signed- Document your independent interpretation of telemetry tracing, EKG, lab, Radiology test or any other diagnostic tests. Add any new diagnostic test ordered today. (Optional):26781} {Results:26384}  Assessment and Plan: No notes have been filed under this hospital service. Service: Hospitalist     Advance Care Planning:   Code Status: Prior ***  Consults: ***  Family Communication: ***  Severity of Illness: {Observation/Inpatient:21159}  Author: Bernadette Hoit, DO 06/24/2021 11:51 PM  For on call review [email or web address].

## 2021-06-24 NOTE — ED Notes (Signed)
ED Provider at bedside. 

## 2021-06-24 NOTE — ED Triage Notes (Signed)
At 21:45 this evening pt has sudden onset of dizziness and R sided numbness. Pt states the symptoms have been intermittent since they started. Pt has hx of previous CVA with similar presentation. Pt has no symptoms on arrival to the ED.

## 2021-06-24 NOTE — H&P (Signed)
History and Physical    Patient: Angela Abbott MCN:[id number] DOB: May 25, 1960 DOA: 06/24/2021 DOS: the patient was seen and examined on 06/25/2021 PCP: Sharilyn Sites, MD  Patient coming from: Home  Chief Complaint:  Chief Complaint  Patient presents with   Transient Ischemic Attack   HPI: Angela Abbott is a 61 y.o. female with medical history significant of prior stroke without any residual deficits, hypertension, GERD, hypothyroidism, type 2 diabetes mellitus, obesity who presents to the emergency department due to dizziness and imbalance associated with right-sided numbness of face, arm and leg which started while getting ready for bed around 9:45 PM, patient states that she sat down and symptoms resolved within a few minutes.  She complained of recurrence of symptoms within 5 minutes which appeared to last longer, she was concerned about stroke since symptoms were similar to an episode in 2019 when she was diagnosed to have CVA (after which she had a right-sided carotid endarterectomy), so she decided to go to the ED for further evaluation and management.  Symptoms have since resolved by the time she arrived at the ED.  She was a former smoker but she quit since 2019, patient denies alcohol or any recreational drug use.  ED Course:  In the emergency department, she was intermittently tachypneic, but other vital signs were within normal range.  Work-up in the ED showed normal CBC and BMP, alcohol level was less than 10 CT head without contrast showed no acute intracranial process Aspirin 325 mg x 1 was given.  Teleneurology was consulted and recommended further stroke work-up.  Hospitalist was asked to admit patient for further evaluation and management.  Review of Systems: Review of systems as noted in the HPI. All other systems reviewed and are negative.   Past Medical History:  Diagnosis Date   Arthritis    shoulders, knees, hips   Back pain    Bilateral swelling of feet and  ankles    Carotid artery occlusion    Dental crowns present    GERD (gastroesophageal reflux disease)    Hypertension    Hypothyroidism    Knee pain    right knee   Left knee pain 07/2014   Osteoarthritis    Other fatigue    Pneumonia    hx of 02/2017    Right knee pain    Shortness of breath on exertion    Stress incontinence    Stroke [facility])    speech difficulty recalling words   Wears partial dentures    upper and lower   Past Surgical History:  Procedure Laterality Date   ABDOMINAL HYSTERECTOMY     partial   CAROTID ENDARTERECTOMY     [place] Right 11/05/2017   Procedure: ENDARTERECTOMY CAROTID RIGHT;  Surgeon: [name], MD;  Location: [place] [facility];  Service: Vascular;  Laterality: Right;   ESOPHAGOGASTRODUODENOSCOPY N/A 11/26/2012   Procedure: ESOPHAGOGASTRODUODENOSCOPY (EGD);  Surgeon: Shann Medal, MD;  Location: [facility];  Service: General;  Laterality: N/A;   GANGLION CYST EXCISION Right 02/2008   foot   KNEE ARTHROSCOPY WITH LATERAL MENISECTOMY Left 07/21/2014   Procedure: LEFT KNEE ARTHROSCOPY,  CHONDROPLASTY, WITH LATERAL AND MEDIAL  MENISCECTOMIES;  Surgeon: Kathryne Hitch, MD;  Location: [place];  Service: Orthopedics;  Laterality: Left;   KNEE ARTHROSCOPY WITH MEDIAL MENISECTOMY Left 07/21/2014   Procedure: KNEE ARTHROSCOPY WITH MEDIAL MENISECTOMY;  Surgeon: Kathryne Hitch, MD;  Location: [place];  Service:  Orthopedics;  Laterality: Left;   LAPAROSCOPIC GASTRIC BANDING  02/18/2008   LAPAROSCOPIC GASTRIC BANDING N/A 2010   LAPAROSCOPIC REPAIR AND REMOVAL OF GASTRIC BAND N/A 03/21/2017   Procedure: LAPAROSCOPIC REPAIR AND REMOVAL OF GASTRIC BAND;  Surgeon: [name], MD;  Location: [facility];  Service: General;  Laterality: N/A;   PARTIAL HYSTERECTOMY  09/07/2002   PATCH ANGIOPLASTY Right 11/05/2017   Procedure: PATCH ANGIOPLASTY of right carotid artery using xenosure bovine  pericardium patch;  Surgeon: [name], MD;  Location: [place] [facility];  Service: Vascular;  Laterality: Right;   SHOULDER ARTHROSCOPY WITH SUBACROMIAL DECOMPRESSION, ROTATOR CUFF REPAIR AND BICEP TENDON REPAIR Right 11/23/2015   Procedure: RIGHT SHOULDER ARTHROSCOPY WITH DEBRIDEMENT, SUBACROMIAL DECOMPRESSION, DISTAL CLAVICLE EXCISION, ROTATOR CUFF REPAIR AND BICEP TENODESIS;  Surgeon: Ninetta Lights, MD;  Location: [place];  Service: Orthopedics;  Laterality: Right;    Social History:  reports that she quit smoking about 3 years ago. Her smoking use included cigarettes. She has a 46.00 pack-year smoking history. She has never used smokeless tobacco. She reports current alcohol use. She reports that she does not use drugs.   Allergies  Allergen Reactions   Bee Venom Shortness Of Breath and Swelling    Family History  Problem Relation Age of Onset   CVA Mother    Hypertension Father    CVA Other    Diabetes Other    CAD Other    Cancer Neg Hx      Prior to Admission medications   Medication Sig Start Date End Date Taking? Authorizing Provider  aspirin 325 MG tablet Take 1 tablet (325 mg total) by mouth daily. 10/29/17   Hosie Poisson, MD  celecoxib (CELEBREX) 200 MG capsule Take 200 mg by mouth 2 (two) times daily.    [provider]  Cholecalciferol (VITAMIN D) 125 MCG (5000 UT) CAPS Take 125 mcg by mouth daily.    [provider]  citalopram (CELEXA) 10 MG tablet Take 10 mg by mouth daily. 02/11/19   [provider]  cyclobenzaprine (FLEXERIL) 10 MG tablet Take 1 tablet (10 mg total) by mouth 2 (two) times daily as needed for muscle spasms. 03/14/21   Francene Finders, PA-C  diltiazem (CARDIZEM CD) 180 MG 24 hr capsule Take 180 mg by mouth at bedtime.  10/31/17   [provider]  docusate sodium (COLACE) 100 MG capsule Take 100 mg by mouth in the morning.    [provider]  Ferrous Sulfate Dried (HIGH POTENCY IRON) 65 MG TABS  Take 1 tablet by mouth daily.    [provider]  gabapentin (NEURONTIN) 300 MG capsule Take 300 mg by mouth at bedtime. 02/11/19   [provider]  levothyroxine (SYNTHROID) 150 MCG tablet Take 150 mcg by mouth every morning. 02/16/19   [provider]  metFORMIN (GLUCOPHAGE) 500 MG tablet 1 po qd at night. 02/22/21   Jearld Lesch A, DO  omeprazole (PRILOSEC) 40 MG capsule Take 1 capsule (40 mg total) by mouth daily. 02/15/17   Kathie Dike, MD  Vibegron 75 MG TABS Take 75 mg by mouth daily. 05/29/21   Jaquita Folds, MD    Physical Exam: BP (!) 144/77 (BP Location: Left Arm)   Pulse 68   Temp 98.7 F (37.1 C) (Oral)   Resp 19   Ht '5\' 6"'$  (1.676 m)   Wt 124.3 kg   SpO2 99%   BMI 44.22 kg/m   General: 61 y.o. year-old female  well developed well nourished in no acute distress.  Alert and oriented x3. HEENT: NCAT, EOMI Neck: Supple, trachea medial Cardiovascular: Regular rate and rhythm with no rubs or gallops.  No thyromegaly or JVD noted.  No lower extremity edema. 2/4 pulses in all 4 extremities. Respiratory: Clear to auscultation with no wheezes or rales. Good inspiratory effort. Abdomen: Soft, nontender nondistended with normal bowel sounds x4 quadrants. Muskuloskeletal: No cyanosis, clubbing or edema noted bilaterally Neuro: CN II-XII intact, strength 5/5 x 4, sensation, reflexes intact. NIHSS 0 Skin: No ulcerative lesions noted or rashes Psychiatry: Judgement and insight appear normal. Mood is appropriate for condition and setting          Labs on Admission:  Basic Metabolic Panel: Recent Labs  Lab 06/24/21 2224  NA 138  K 3.9  CL 103  CO2 28  GLUCOSE 104*  BUN 20  CREATININE 0.91  CALCIUM 9.1   Liver Function Tests: Recent Labs  Lab 06/24/21 2224  AST 15  ALT 22  ALKPHOS 76  BILITOT 0.5  PROT 7.1  ALBUMIN 3.6   No results for input(s): "LIPASE", "AMYLASE" in the last 168 hours. No results for input(s): "AMMONIA" in the last  168 hours. CBC: Recent Labs  Lab 06/24/21 2224  WBC 9.4  NEUTROABS 6.1  HGB 13.7  HCT 43.1  MCV 91.9  PLT 266   Cardiac Enzymes: No results for input(s): "CKTOTAL", "CKMB", "CKMBINDEX", "TROPONINI" in the last 168 hours.  BNP (last 3 results) No results for input(s): "BNP" in the last 8760 hours.  ProBNP (last 3 results) No results for input(s): "PROBNP" in the last 8760 hours.  CBG: Recent Labs  Lab 06/24/21 2239  GLUCAP 136*    Radiological Exams on Admission: CT HEAD CODE STROKE WO CONTRAST  Result Date: 06/24/2021 CLINICAL DATA:  Code stroke.  Dizziness and right-sided numbness EXAM: CT HEAD WITHOUT CONTRAST TECHNIQUE: Contiguous axial images were obtained from the base of the skull through the vertex without intravenous contrast. RADIATION DOSE REDUCTION: This exam was performed according to the departmental dose-optimization program which includes automated exposure control, adjustment of the mA and/or kV according to patient size and/or use of iterative reconstruction technique. COMPARISON:  01/31/2019 FINDINGS: Brain: No evidence of acute infarction, hemorrhage, cerebral edema, mass, mass effect, or midline shift. No hydrocephalus or extra-axial collection. Redemonstrated remote small posterior right frontal cortical infarcts. Vascular: No hyperdense vessel. Atherosclerotic calcifications in the intracranial carotid and vertebral arteries. Skull: Negative for fracture or focal lesion. Sinuses/Orbits: No acute finding. Other: The mastoid air cells are well aerated. ASPECTS [facility] Stroke Program Early CT Score) - Ganglionic level infarction (caudate, lentiform nuclei, internal capsule, insula, M1-M3 cortex): 7 - Supraganglionic infarction (M4-M6 cortex): 3 Total score (0-10 with 10 being normal): 10 IMPRESSION: 1. No acute intracranial process. 2. ASPECTS is 10 Code stroke imaging results were communicated on 06/24/2021 at 10:51 pm to provider ZACKOWSKI via telephone, who  verbally acknowledged these results. Electronically Signed   By: Merilyn Baba M.D.   On: 06/24/2021 22:52    EKG: I independently viewed the EKG done and my findings are as followed: Normal sinus rhythm at a rate of 71 bpm  Assessment/Plan Present on Admission:  Transient ischemic attack (TIA)  GERD (gastroesophageal reflux disease)  CVA (cerebral vascular accident) ([place])  Principal Problem:   Transient ischemic attack (TIA) Active Problems:   Acquired hypothyroidism   GERD (gastroesophageal reflux disease)   CVA (cerebral vascular accident) ([place])   Essential hypertension  Obesity, Class III, BMI 40-49.9 (morbid obesity) ([place])   Diabetes mellitus ([place])   Transient ischemic attack; rule out acute ischemic stroke Patient will be admitted to telemetry unit  Bilateral carotid ultrasound in the morning Echocardiogram in the morning MRI of brain without contrast in the morning Continue aspirin and Plavix for 21 days, then continue monotherapy with aspirin  Continue fall precautions and neuro checks Lipid panel and hemoglobin A1c will be checked Continue PT/SLP/OT eval and treat Bedside swallow eval by nursing prior to diet Tele neurology will be consulted to follow up with patient in the morning   Essential hypertension  Antihypertensives PRN if Blood pressure is greater than 220/120 or there is a concern for End organ damage/contraindications for permissive HTN. If blood pressure is greater than 220/120 give labetalol PO or IV or Vasotec IV with a goal of 15% reduction in BP during the first 24 hours.  GERD Continue Protonix  Acquired hypothyroidism Continue Synthroid  Type 2 diabetes mellitus Continue ISS and hypoglycemic protocol Metformin will be held at this time  Obesity class III (BMI 44.22 kg/m) Diet and lifestyle modification   DVT prophylaxis: SCDs  Code Status: Full code  Consults: Teleneurology  Family Communication: None at bedside  Severity of  Illness: The appropriate patient status for this patient is OBSERVATION. Observation status is judged to be reasonable and necessary in order to provide the required intensity of service to ensure the patient's safety. The patient's presenting symptoms, physical exam findings, and initial radiographic and laboratory data in the context of their medical condition is felt to place them at decreased risk for further clinical deterioration. Furthermore, it is anticipated that the patient will be medically stable for discharge from the hospital within 2 midnights of admission.   Author: Bernadette Hoit, DO 06/25/2021 12:55 AM  For on call review [email or web address].

## 2021-06-24 NOTE — Consult Note (Signed)
[facility] TeleNeurology Consult Services   Patient Name:   Angela, Abbott Date of Birth:   1960-11-16 Identification Number:   MRN - [id number] Date of Service:   06/24/2021 23:03:55  Diagnosis:       G45.9 - Transient cerebral ischemic attack, unspecified  Impression:      61yo woman w/PMH of HTN, DM, HLD, CVA 2019 with no residual deficits, hypothyroidism, GERD p/w right face/arm/leg numbness, dizziness on 06/24/21. She states she was getting ready for bed when she developed imbalance dizziness so she sat down. She then had right face and arm numbness, followed by her right foot. She laid down and it improved but then 5 minutes later it recurred and lasted longer. She then came to the ED. She had a similar episode in 2019 that was diagnosed as a CVA. She currently denies complaints. NIHSS 0, exam is non-focal. CT Head has no acute findings. Pt is not a candidate for thrombolytics due to lack of disabling deficits at this time. Presentation is concerning for TIA or acute small vessel ischemic stroke based on history and exam, stroke workup advised. Plan listed below was recommended to the ED physician by phone.  Our recommendations are outlined below.  Recommendations:        Stroke/Telemetry Floor       Neuro Checks       Bedside Swallow Eval       DVT Prophylaxis       IV Fluids, Normal Saline       Head of Bed 30 Degrees       Euglycemia and Avoid Hyperthermia (PRN Acetaminophen)       Initiate dual antiplatelet therapy with Aspirin 81 mg daily and Clopidogrel 75 mg daily for 21 days, then monotherapy with aspirin       Antihypertensives PRN if Blood pressure is greater than 220/120 or there is a concern for End organ damage/contraindications for permissive HTN. If blood pressure is greater than 220/120 give labetalol PO or IV or Vasotec IV with a goal of 15% reduction in BP during the first 24 hours.       MRI Brain, MRA Head/Neck without contrast to assess for  stroke       Transthoracic Echo (if not recently done)       Lipid Profile, A1C       PT/OT, Speech/Swallow evaluation  Per facility request will defer further work up, management, and referrals to inpatient service, inclusive of inpatient neurology consult.  Sign Out:       Discussed with Emergency Department Provider    ------------------------------------------------------------------------------  Advanced Imaging: Advanced Imaging Deferred because: Non-disabling symptoms as verified by the patient; no cortical signs so not consistent with LVO   Metrics: Last Known Well: 06/24/2021 21:45:00 [facility] Notification Time: 06/24/2021 23:03:55 Arrival Time: 06/24/2021 22:05:00 Stamp Time: 06/24/2021 23:03:55 Initial Response Time: 06/24/2021 23:07:31 Symptoms: right sided numbness, dizziness. Initial patient interaction: 06/24/2021 23:12:29 NIHSS Assessment Completed: 06/24/2021 23:15:33 Patient is not a candidate for Thrombolytic. Thrombolytic Medical Decision: 06/24/2021 23:18:21 Patient was not deemed candidate for Thrombolytic because of following reasons: Resolved symptoms (no residual disabling symptoms).  CT head showed no acute hemorrhage or acute core infarct.  Primary Provider Notified of Diagnostic Impression and Management Plan on: 06/24/2021 23:29:20    ------------------------------------------------------------------------------  History of Present Illness: Patient is a 61 year old Female.  Patient was brought by private transportation with symptoms of right sided numbness, dizziness. 61yo woman w/PMH of HTN, DM, HLD, CVA 2019  with no residual deficits, hypothyroidism, GERD p/w right face/arm/leg numbness, dizziness on 06/24/21. She states she was getting ready for bed when she developed imbalance dizziness so she sat down. She then had right face and arm numbness, followed by her right foot. She laid down and it improved but then 5 minutes later it  recurred and lasted longer. She then came to the ED. She had a similar episode in 2019 that was diagnosed as a CVA. She currently denies complaints.   Past Medical History: see hpi  Medications: No Anticoagulant use  Antiplatelet use: Yes aspirin Reviewed EMR for current medications  Allergies:  Reviewed  Social History: Smoking: Former Drug Use: No  Family History: There is no family history of premature cerebrovascular disease pertinent to this consultation  ROS : 14 Points Review of Systems was performed and was negative except mentioned in HPI.  Past Surgical History: There Is No Surgical History Contributory To Today's Visit    Examination: BP(130/71), Pulse(71), Blood Glucose(136) 1A: Level of Consciousness - Alert; keenly responsive + 0 1B: Ask Month and Age - Both Questions Right + 0 1C: Blink Eyes & Squeeze Hands - Performs Both Tasks + 0 2: Test Horizontal Extraocular Movements - Normal + 0 3: Test Visual Fields - No Visual Loss + 0 4: Test Facial Palsy (Use Grimace if Obtunded) - Normal symmetry + 0 5A: Test Left Arm Motor Drift - No Drift for 10 Seconds + 0 5B: Test Right Arm Motor Drift - No Drift for 10 Seconds + 0 6A: Test Left Leg Motor Drift - No Drift for 5 Seconds + 0 6B: Test Right Leg Motor Drift - No Drift for 5 Seconds + 0 7: Test Limb Ataxia (FNF/Heel-Shin) - No Ataxia + 0 8: Test Sensation - Normal; No sensory loss + 0 9: Test Language/Aphasia - Normal; No aphasia + 0 10: Test Dysarthria - Normal + 0 11: Test Extinction/Inattention - No abnormality + 0  NIHSS Score: 0   Pre-Morbid Modified Rankin Scale: 0 Points = No symptoms at all   Patient/Family was informed the Neurology Consult would occur via TeleHealth consult by way of interactive audio and video telecommunications and consented to receiving care in this manner.   Patient is being evaluated for possible acute neurologic impairment and high probability of imminent or  life-threatening deterioration. I spent total of 35 minutes providing care to this patient, including time for face to face visit via telemedicine, review of medical records, imaging studies and discussion of findings with providers, the patient and/or family.   Dr Lenard Galloway    [facility] [phone]   Case [id number]

## 2021-06-24 NOTE — ED Provider Notes (Addendum)
[facility] EMERGENCY DEPARTMENT Provider Note   CSN: [id number] Arrival date & time: 06/24/21  2205     History  Chief Complaint  Patient presents with   Transient Ischemic Attack    Angela Abbott is a 61 y.o. female.  Patient about 2145 with sudden onset of some dizziness quickly followed by numbness to the right hand and arm and then developed numbness to the right foot.  The numbness would last about 3 minutes and go away but then would rapidly come back again.  Not associated with any speech problems visual problems or any weakness.  But this coming and going has continued.  No headache.  In 2019 patient had a prior stroke that affected the left side of her body.  And had a right-sided carotid carotid endarterectomy done.  Medical history significant for hypothyroidism gastroesophageal reflux disease hypertension prior stroke stated that she had troubles speech difficulty recalling words.  Apparently that all resolved.  Osteoarthritis.  Past surgical history significant for partial hysterectomy laparoscopic gastric banding carotid endarterectomy.  Patient previous smoker quit in 2019       Home Medications Prior to Admission medications   Medication Sig Start Date End Date Taking? Authorizing Provider  aspirin 325 MG tablet Take 1 tablet (325 mg total) by mouth daily. 10/29/17   Hosie Poisson, MD  celecoxib (CELEBREX) 200 MG capsule Take 200 mg by mouth 2 (two) times daily.    [provider]  Cholecalciferol (VITAMIN D) 125 MCG (5000 UT) CAPS Take 125 mcg by mouth daily.    [provider]  citalopram (CELEXA) 10 MG tablet Take 10 mg by mouth daily. 02/11/19   [provider]  cyclobenzaprine (FLEXERIL) 10 MG tablet Take 1 tablet (10 mg total) by mouth 2 (two) times daily as needed for muscle spasms. 03/14/21   Francene Finders, PA-C  diltiazem (CARDIZEM CD) 180 MG 24 hr capsule Take 180 mg by mouth at bedtime.  10/31/17   [provider]   docusate sodium (COLACE) 100 MG capsule Take 100 mg by mouth in the morning.    [provider]  Ferrous Sulfate Dried (HIGH POTENCY IRON) 65 MG TABS Take 1 tablet by mouth daily.    [provider]  gabapentin (NEURONTIN) 300 MG capsule Take 300 mg by mouth at bedtime. 02/11/19   [provider]  levothyroxine (SYNTHROID) 150 MCG tablet Take 150 mcg by mouth every morning. 02/16/19   [provider]  metFORMIN (GLUCOPHAGE) 500 MG tablet 1 po qd at night. 02/22/21   Jearld Lesch A, DO  omeprazole (PRILOSEC) 40 MG capsule Take 1 capsule (40 mg total) by mouth daily. 02/15/17   Kathie Dike, MD  Vibegron 75 MG TABS Take 75 mg by mouth daily. 05/29/21   Jaquita Folds, MD      Allergies    Bee venom    Review of Systems   Review of Systems  Constitutional:  Negative for chills and fever.  HENT:  Negative for ear pain and sore throat.   Eyes:  Negative for pain and visual disturbance.  Respiratory:  Negative for cough and shortness of breath.   Cardiovascular:  Negative for chest pain and palpitations.  Gastrointestinal:  Negative for abdominal pain and vomiting.  Genitourinary:  Negative for dysuria and hematuria.  Musculoskeletal:  Negative for arthralgias and back pain.  Skin:  Negative for color change and rash.  Neurological:  Positive for dizziness and numbness. Negative for seizures, syncope, facial  asymmetry, speech difficulty, weakness and headaches.  All other systems reviewed and are negative.   Physical Exam Updated Vital Signs BP 130/71   Pulse 76   Temp 97.8 F (36.6 C) (Oral)   Resp (!) 22   Ht 1.676 m ('5\' 6"'$ )   Wt 124.3 kg   SpO2 93%   BMI 44.22 kg/m  Physical Exam Vitals and nursing note reviewed.  Constitutional:      General: She is not in acute distress.    Appearance: Normal appearance. She is well-developed.  HENT:     Head: Normocephalic and atraumatic.  Eyes:     Extraocular Movements: Extraocular movements  intact.     Conjunctiva/sclera: Conjunctivae normal.     Pupils: Pupils are equal, round, and reactive to light.  Cardiovascular:     Rate and Rhythm: Normal rate and regular rhythm.     Heart sounds: No murmur heard. Pulmonary:     Effort: Pulmonary effort is normal. No respiratory distress.     Breath sounds: Normal breath sounds.  Abdominal:     Palpations: Abdomen is soft.     Tenderness: There is no abdominal tenderness.  Musculoskeletal:        General: No swelling.     Cervical back: Normal range of motion and neck supple.  Skin:    General: Skin is warm and dry.     Capillary Refill: Capillary refill takes less than 2 seconds.  Neurological:     Mental Status: She is alert.     Cranial Nerves: No cranial nerve deficit.     Motor: No weakness.     Comments: No actual numbness at the time I examined her.  Psychiatric:        Mood and Affect: Mood normal.     ED Results / Procedures / Treatments   Labs (all labs ordered are listed, but only abnormal results are displayed) Labs Reviewed  COMPREHENSIVE METABOLIC PANEL - Abnormal; Notable for the following components:      Result Value   Glucose, Bld 104 (*)    All other components within normal limits  CBG MONITORING, ED - Abnormal; Notable for the following components:   Glucose-Capillary 136 (*)    All other components within normal limits  RESP PANEL BY RT-PCR (FLU A&B, COVID) ARPGX2  PROTIME-INR  APTT  CBC  DIFFERENTIAL  ETHANOL  RAPID URINE DRUG SCREEN, HOSP PERFORMED  URINALYSIS, ROUTINE W REFLEX MICROSCOPIC  I-STAT CHEM 8, ED  I-STAT CHEM 8, ED    EKG EKG Interpretation  Date/Time:  Sunday June 24 2021 22:27:27 EDT Ventricular Rate:  71 PR Interval:  190 QRS Duration: 101 QT Interval:  370 QTC Calculation: 402 R Axis:   -45 Text Interpretation: Sinus rhythm LAD, consider left anterior fascicular block Low voltage, precordial leads Abnormal R-wave progression, late transition Confirmed by  Fredia Sorrow [phone]) on 06/24/2021 11:06:45 PM  Radiology CT HEAD CODE STROKE WO CONTRAST  Result Date: 06/24/2021 CLINICAL DATA:  Code stroke.  Dizziness and right-sided numbness EXAM: CT HEAD WITHOUT CONTRAST TECHNIQUE: Contiguous axial images were obtained from the base of the skull through the vertex without intravenous contrast. RADIATION DOSE REDUCTION: This exam was performed according to the departmental dose-optimization program which includes automated exposure control, adjustment of the mA and/or kV according to patient size and/or use of iterative reconstruction technique. COMPARISON:  01/31/2019 FINDINGS: Brain: No evidence of acute infarction, hemorrhage, cerebral edema, mass, mass effect, or midline shift. No hydrocephalus or extra-axial collection.  Redemonstrated remote small posterior right frontal cortical infarcts. Vascular: No hyperdense vessel. Atherosclerotic calcifications in the intracranial carotid and vertebral arteries. Skull: Negative for fracture or focal lesion. Sinuses/Orbits: No acute finding. Other: The mastoid air cells are well aerated. ASPECTS [facility] Stroke Program Early CT Score) - Ganglionic level infarction (caudate, lentiform nuclei, internal capsule, insula, M1-M3 cortex): 7 - Supraganglionic infarction (M4-M6 cortex): 3 Total score (0-10 with 10 being normal): 10 IMPRESSION: 1. No acute intracranial process. 2. ASPECTS is 10 Code stroke imaging results were communicated on 06/24/2021 at 10:51 pm to provider  via telephone, who verbally acknowledged these results. Electronically Signed   By: Merilyn Baba M.D.   On: 06/24/2021 22:52    Procedures Procedures    Medications Ordered in ED Medications  sodium chloride flush (NS) 0.9 % injection 3 mL (3 mLs Intravenous Given 06/24/21 2257)    ED Course/ Medical Decision Making/ A&P                           Medical Decision Making Amount and/or Complexity of Data Reviewed Labs: ordered. Radiology:  ordered.   CRITICAL CARE Performed by: Fredia Sorrow Total critical care time: 45 minutes Critical care time was exclusive of separately billable procedures and treating other patients. Critical care was necessary to treat or prevent imminent or life-threatening deterioration. Critical care was time spent personally by me on the following activities: development of treatment plan with patient and/or surrogate as well as nursing, discussions with consultants, evaluation of patient's response to treatment, examination of patient, obtaining history from patient or surrogate, ordering and performing treatments and interventions, ordering and review of laboratory studies, ordering and review of radiographic studies, pulse oximetry and re-evaluation of patient's condition.  Code stroke activated.  Patient with acute onset at 2145 dizziness and then right hand and arm numbness that was intermittent coming and going lasting for 3 minutes when it was present occurring frequently.  And also numbness to her right foot.  And numbness to the right side of her face.  Teleneurology will evaluate CT head ordered code stroke order set ordered.  CT without acute findings.  Shows evidence of old CVA.  Discussed with telemetry specialist neurology recommending admission MRI in the morning probable TIA all symptoms have currently resolved.  And starting her on aspirin.  Labs significant for alcohol less than 10 blood sugar 136, CBC no leukocytosis hemoglobin 13.7 platelets normal complete metabolic panel liver function test normal renal function test normal electrolytes normal.  We will contact hospitalist for admission.  Final Clinical Impression(s) / ED Diagnoses Final diagnoses:  Cerebrovascular accident (CVA), unspecified mechanism [facility])    Rx / [place] Orders ED Discharge Orders     None         Fredia Sorrow, MD 06/24/21 2311    Fredia Sorrow, MD 06/24/21 2336

## 2021-06-25 ENCOUNTER — Observation Stay (HOSPITAL_COMMUNITY): Payer: 59

## 2021-06-25 ENCOUNTER — Observation Stay (HOSPITAL_BASED_OUTPATIENT_CLINIC_OR_DEPARTMENT_OTHER): Payer: 59

## 2021-06-25 DIAGNOSIS — E039 Hypothyroidism, unspecified: Secondary | ICD-10-CM | POA: Diagnosis not present

## 2021-06-25 DIAGNOSIS — G459 Transient cerebral ischemic attack, unspecified: Secondary | ICD-10-CM

## 2021-06-25 DIAGNOSIS — E119 Type 2 diabetes mellitus without complications: Secondary | ICD-10-CM | POA: Diagnosis not present

## 2021-06-25 DIAGNOSIS — I10 Essential (primary) hypertension: Secondary | ICD-10-CM | POA: Diagnosis not present

## 2021-06-25 LAB — RESP PANEL BY RT-PCR (FLU A&B, COVID) ARPGX2
Influenza A by PCR: NEGATIVE
Influenza B by PCR: NEGATIVE
SARS Coronavirus 2 by RT PCR: NEGATIVE

## 2021-06-25 LAB — COMPREHENSIVE METABOLIC PANEL
ALT: 19 U/L (ref 0–44)
AST: 13 U/L — ABNORMAL LOW (ref 15–41)
Albumin: 3.4 g/dL — ABNORMAL LOW (ref 3.5–5.0)
Alkaline Phosphatase: 68 U/L (ref 38–126)
Anion gap: 5 (ref 5–15)
BUN: 18 mg/dL (ref 8–23)
CO2: 29 mmol/L (ref 22–32)
Calcium: 9.1 mg/dL (ref 8.9–10.3)
Chloride: 106 mmol/L (ref 98–111)
Creatinine, Ser: 0.79 mg/dL (ref 0.44–1.00)
GFR, Estimated: >60 mL/min (ref >60)
Glucose, Bld: 100 mg/dL — ABNORMAL HIGH (ref 70–99)
Potassium: 4.2 mmol/L (ref 3.5–5.1)
Sodium: 140 mmol/L (ref 135–145)
Total Bilirubin: 0.3 mg/dL (ref 0.3–1.2)
Total Protein: 6.8 g/dL (ref 6.5–8.1)

## 2021-06-25 LAB — CBC
HCT: 41.4 % (ref 36.0–46.0)
Hemoglobin: 13.1 g/dL (ref 12.0–15.0)
MCH: 29 pg (ref 26.0–34.0)
MCHC: 31.6 g/dL (ref 30.0–36.0)
MCV: 91.8 fL (ref 80.0–100.0)
Platelets: 242 10*3/uL (ref 150–400)
RBC: 4.51 MIL/uL (ref 3.87–5.11)
RDW: 14.5 % (ref 11.5–15.5)
WBC: 9.7 10*3/uL (ref 4.0–10.5)
nRBC: 0 % (ref 0.0–0.2)

## 2021-06-25 LAB — URINALYSIS, ROUTINE W REFLEX MICROSCOPIC
Bilirubin Urine: NEGATIVE
Glucose, UA: NEGATIVE mg/dL
Ketones, ur: NEGATIVE mg/dL
Leukocytes,Ua: NEGATIVE
Nitrite: NEGATIVE
Protein, ur: NEGATIVE mg/dL
Specific Gravity, Urine: 1.025 (ref 1.005–1.030)
pH: 5 (ref 5.0–8.0)

## 2021-06-25 LAB — ECHOCARDIOGRAM COMPLETE BUBBLE STUDY
AR max vel: 1.66 cm2
AV Area VTI: 1.8 cm2
AV Area mean vel: 1.47 cm2
AV Mean grad: 8 mmHg
AV Peak grad: 12.3 mmHg
Ao pk vel: 1.75 m/s
Area-P 1/2: 2.83 cm2
Calc EF: 56.7 %
MV VTI: 2.71 cm2
S' Lateral: 3.1 cm
Single Plane A2C EF: 57.8 %
Single Plane A4C EF: 56.9 %

## 2021-06-25 LAB — HEMOGLOBIN A1C
Hgb A1c MFr Bld: 6.1 % — ABNORMAL HIGH (ref 4.8–5.6)
Mean Plasma Glucose: 128.37 mg/dL

## 2021-06-25 LAB — MAGNESIUM: Magnesium: 2.1 mg/dL (ref 1.7–2.4)

## 2021-06-25 LAB — LIPID PANEL
Cholesterol: 156 mg/dL (ref 0–200)
HDL: 51 mg/dL (ref >40)
LDL Cholesterol: 94 mg/dL (ref 0–99)
Total CHOL/HDL Ratio: 3.1 RATIO
Triglycerides: 57 mg/dL (ref <150)
VLDL: 11 mg/dL (ref 0–40)

## 2021-06-25 LAB — RAPID URINE DRUG SCREEN, HOSP PERFORMED
Amphetamines: NOT DETECTED
Barbiturates: NOT DETECTED
Benzodiazepines: NOT DETECTED
Cocaine: NOT DETECTED
Opiates: NOT DETECTED
Tetrahydrocannabinol: NOT DETECTED

## 2021-06-25 LAB — HIV ANTIBODY (ROUTINE TESTING W REFLEX): HIV Screen 4th Generation wRfx: NONREACTIVE

## 2021-06-25 LAB — GLUCOSE, CAPILLARY
Glucose-Capillary: 119 mg/dL — ABNORMAL HIGH (ref 70–99)
Glucose-Capillary: 90 mg/dL (ref 70–99)
Glucose-Capillary: 116 mg/dL — ABNORMAL HIGH (ref 70–99)

## 2021-06-25 LAB — PHOSPHORUS: Phosphorus: 3.8 mg/dL (ref 2.5–4.6)

## 2021-06-25 MED ORDER — PRAVASTATIN SODIUM 10 MG PO TABS
20.0000 mg | ORAL_TABLET | Freq: Every day | ORAL | Status: DC
  Administered 2021-06-25: 20 mg via ORAL
  Filled 2021-06-25: qty 2

## 2021-06-25 MED ORDER — CLOPIDOGREL BISULFATE 75 MG PO TABS
75.0000 mg | ORAL_TABLET | Freq: Every day | ORAL | Status: DC
  Administered 2021-06-25: 75 mg via ORAL
  Filled 2021-06-25: qty 1

## 2021-06-25 MED ORDER — PANTOPRAZOLE SODIUM 40 MG PO TBEC
40.0000 mg | DELAYED_RELEASE_TABLET | Freq: Every day | ORAL | Status: DC
  Administered 2021-06-25: 40 mg via ORAL
  Filled 2021-06-25: qty 1

## 2021-06-25 MED ORDER — CLOPIDOGREL BISULFATE 75 MG PO TABS: 75.0000 mg | ORAL_TABLET | Freq: Every day | ORAL | 3 refills | Status: AC

## 2021-06-25 MED ORDER — PRAVASTATIN SODIUM 20 MG PO TABS: 20.0000 mg | ORAL_TABLET | Freq: Every day | ORAL | 2 refills | Status: DC

## 2021-06-25 MED ORDER — INSULIN ASPART 100 UNIT/ML IJ SOLN: 0.0000 [IU] | INTRAMUSCULAR | Status: DC

## 2021-06-25 MED ORDER — STROKE: EARLY STAGES OF RECOVERY BOOK: Freq: Once | Status: AC

## 2021-06-25 MED ORDER — LEVOTHYROXINE SODIUM 75 MCG PO TABS
150.0000 ug | ORAL_TABLET | Freq: Every day | ORAL | Status: DC
  Administered 2021-06-25: 150 ug via ORAL
  Filled 2021-06-25: qty 2

## 2021-06-25 MED ORDER — ASPIRIN 81 MG PO TBEC
81.0000 mg | DELAYED_RELEASE_TABLET | Freq: Every day | ORAL | Status: DC
  Administered 2021-06-25: 81 mg via ORAL
  Filled 2021-06-25: qty 1

## 2021-06-25 MED ORDER — VIBEGRON 75 MG PO TABS: 75.0000 mg | ORAL_TABLET | Freq: Every day | ORAL | Status: DC

## 2021-06-25 NOTE — Discharge Summary (Signed)
Physician Discharge Summary   Patient: Angela Abbott MRN: [id number] DOB: January 03, 1961  Admit date:     06/24/2021  Discharge date: 06/25/21  Discharge Physician: Shanon Brow    PCP: Sharilyn Sites, MD   Recommendations at discharge:   Please follow up with primary care provider within 1-2 weeks  Please repeat BMP and CBC in one week     Hospital Course: 61 year old female with a history of stroke, hypertension, diabetes mellitus type 2, hypothyroidism, GERD, carotid stenosis status post right carotid endarterectomy, tobacco abuse in remission presenting with numbness in her right forearm and right foot that started around 9:45 PM on 06/24/2021.  The patient states that she was walking back to her bedroom from her bathroom when she began experiencing some dizziness.  Subsequently, she sat down on her bed and developed numbness in her right forearm from her wrist to her fingers as well as her right foot.  It lasted about 2 to 3 minutes.  However after period of time her symptoms came back.  She walked to tell her son to take her to the emergency department when she felt like there was some heaviness in her right foot.  The patient continued to have some intermittent dizziness and numbness in her right forearm and foot.  She denies any visual disturbance, headache, dysarthria, or other focal weakness. She denies any fevers, chills, headache, neck pain, chest pain, shortness of breath, cough, hemoptysis, nausea, vomiting, diarrhea, abdominal pain, dysuria. In the ED, the patient was afebrile and hemodynamically stable with oxygen saturation 99% on room air.  BMP showed sodium 138, potassium 3.9, bicarbonate 28, serum creatinine 0.91.  LFTs were unremarkable.  WBC 9.4, hemoglobin 13.7, platelets 266,000.  EKG shows sinus rhythm with no ST or T wave changes.  UA was negative for pyuria.  Urine drug screen was negative.  CT of the brain was negative for acute findings but showed a remote right posterior  frontal cortical infarct.  The patient was admitted for further stroke work-up.  Neurology was consulted to assist with management.  Assessment and Plan: * Transient ischemic attack (TIA) Presented with intermittent sensory disturbance -Appreciate Neurology Consult -PT/OT evaluation -Speech therapy eval -CT brain--negative -MRI brain--Stable remote cortical infarct of the right precentral gyrus -MRA brain--Progressive, moderate bilateral supraclinoid ICA stenoses; Unchanged severe bilateral P2 stenoses -Carotid Duplex--50-69% stenosis of the right internal carotid artery. Greater than 70% stenosis of the left internal carotid artery -Echo--EF 60-65%, no WMA, G1DD; no PFO -LDL--94 -HbA1C--6.1 -Antiplatelet--ASA + Plavix x 3 months, then plavix alone (pt was already on ASA PTA) -set up 30 day event monitor  Controlled type 2 diabetes mellitus without complication, without long-term current use of insulin (HCC) Holding metformin NovoLog sliding scale Hemoglobin A1c 6.1  Obesity, Class III, BMI 40-49.9 (morbid obesity) (HCC) Lifestyle modification BMI 44.22  Essential hypertension Holding diltiazem to allow for permissive hypertension Restart after d/c  Hyperlipidemia Currently taking Zetia Previously had myalgias with Lipitor LDL 94 -pt is willing to try more water soluble statin in hopes of less myalgia--start pravastatin  GERD (gastroesophageal reflux disease) Continue PPI  Acquired hypothyroidism Continue Synthroid         Consultants: neurology Procedures performed: none  Disposition: Home Diet recommendation:  Carb modified diet DISCHARGE MEDICATION: Allergies as of 06/25/2021       Reactions   Bee Venom Shortness Of Breath, Swelling        Medication List     STOP taking these medications    omeprazole  40 MG capsule Commonly known as: PRILOSEC       TAKE these medications    aspirin 325 MG tablet Take 1 tablet (325 mg total) by mouth  daily.   celecoxib 200 MG capsule Commonly known as: CELEBREX Take 200 mg by mouth 2 (two) times daily.   citalopram 10 MG tablet Commonly known as: CELEXA Take 10 mg by mouth daily.   clopidogrel 75 MG tablet Commonly known as: PLAVIX Take 1 tablet (75 mg total) by mouth daily. Start taking on: June 26, 2021   diltiazem 180 MG 24 hr capsule Commonly known as: CARDIZEM CD Take 180 mg by mouth at bedtime.   docusate sodium 100 MG capsule Commonly known as: COLACE Take 100 mg by mouth in the morning.   ezetimibe 10 MG tablet Commonly known as: ZETIA Take 10 mg by mouth daily.   gabapentin 300 MG capsule Commonly known as: NEURONTIN Take 300 mg by mouth at bedtime.   High Potency Iron 65 MG Tabs Take 1 tablet by mouth daily.   levothyroxine 150 MCG tablet Commonly known as: SYNTHROID Take 150 mcg by mouth every morning.   metFORMIN 500 MG tablet Commonly known as: GLUCOPHAGE 1 po qd at night. What changed:  how much to take how to take this when to take this additional instructions   pravastatin 20 MG tablet Commonly known as: PRAVACHOL Take 1 tablet (20 mg total) by mouth daily at 6 PM.   Vibegron 75 MG Tabs Take 75 mg by mouth daily.   Vitamin D 125 MCG (5000 UT) Caps Take 125 mcg by mouth daily.        Discharge Exam: Filed Weights   06/24/21 2215  Weight: 124.3 kg   HEENT:  [place]/AT, No thrush, no icterus CV:  RRR, no rub, no S3, no S4 Lung:  CTA, no wheeze, no rhonchi Abd:  soft/+BS, NT Ext:  No edema, no lymphangitis, no synovitis, no rash   Condition at discharge: stable  The results of significant diagnostics from this hospitalization (including imaging, microbiology, ancillary and laboratory) are listed below for reference.   Imaging Studies: MR ANGIO HEAD WO CONTRAST  Result Date: 06/25/2021 CLINICAL DATA:  Stroke follow-up.  Weakness and dizziness. EXAM: MRA HEAD WITHOUT CONTRAST TECHNIQUE: Angiographic images of the [place] were acquired using MRA technique without intravenous contrast. COMPARISON:  Head and neck CTA 01/31/2019 FINDINGS: Anterior circulation: The internal carotid arteries are patent from skull base to carotid termini with moderate bilateral proximal supraclinoid stenoses which have progressed from prior. ACAs and MCAs are patent without evidence of a proximal branch occlusion or significant M1 stenosis. There is a mild-to-moderate stenosis of the left A1 origin. No aneurysm is identified. Posterior circulation: The intracranial vertebral arteries are widely patent to the basilar. Patent bilateral PICA, right and likely left AICA, and bilateral SCA origins are identified. The basilar artery is widely patent. Posterior communicating arteries are diminutive or absent. Both PCAs are patent with similar appearance of severe bilateral P2 stenoses. No aneurysm is identified. Anatomic variants: None. IMPRESSION: 1. No large vessel occlusion. 2. Progressive, moderate bilateral supraclinoid ICA stenoses. 3. Unchanged severe bilateral P2 stenoses. Electronically Signed   By: Logan Bores M.D.   On: 06/25/2021 13:36   ECHOCARDIOGRAM COMPLETE BUBBLE STUDY  Result Date: 06/25/2021    ECHOCARDIOGRAM REPORT   Patient Name:   Angela Abbott Date of Exam: 06/25/2021 Medical Rec #:  [id number]        Height:  66.0 in Accession #:    [id number]       Weight:       274.0 lb Date of Birth:  1960-07-11        BSA:          2.286 m Patient Age:    40 years         BP:           143/74 mmHg Patient Gender: F                HR:           63 bpm. Exam Location:  [facility] Procedure: 2D Echo, Cardiac Doppler, Color Doppler and Saline Contrast Bubble            Study Indications:    TIA  History:        Patient has prior history of Echocardiogram examinations, most                 recent 10/28/2017. Risk Factors:Hypertension, Diabetes,                 Dyslipidemia and Current Smoker.  Sonographer:    Wenda Low Referring  Phys: [id number] OLADAPO ADEFESO  Sonographer Comments: Patient is morbidly obese. IMPRESSIONS  1. Left ventricular ejection fraction, by estimation, is 60 to 65%. The left ventricle has normal function. The left ventricle has no regional wall motion abnormalities. Left ventricular diastolic parameters are consistent with Grade I diastolic dysfunction (impaired relaxation).  2. Right ventricular systolic function is normal. The right ventricular size is normal. There is normal pulmonary artery systolic pressure.  3. The mitral valve is grossly normal. No evidence of mitral valve regurgitation.  4. The aortic valve is tricuspid. There is mild calcification of the aortic valve. There is mild thickening of the aortic valve. Aortic valve regurgitation is not visualized. Aortic valve sclerosis/calcification is present, without any evidence of aortic stenosis.  5. Agitated saline contrast bubble study was negative, with no evidence of any interatrial shunt. Comparison(s): Compared to prior TTE in 2019, there is no significant change. Conclusion(s)/Recommendation(s): No intracardiac source of embolism detected on this transthoracic study. Consider a transesophageal echocardiogram to exclude cardiac source of embolism if clinically indicated. FINDINGS  Left Ventricle: Left ventricular ejection fraction, by estimation, is 60 to 65%. The left ventricle has normal function. The left ventricle has no regional wall motion abnormalities. The left ventricular internal cavity size was normal in size. There is  no left ventricular hypertrophy. Left ventricular diastolic parameters are consistent with Grade I diastolic dysfunction (impaired relaxation). Right Ventricle: The right ventricular size is normal. No increase in right ventricular wall thickness. Right ventricular systolic function is normal. There is normal pulmonary artery systolic pressure. The tricuspid regurgitant velocity is 1.62 m/s, and  with an assumed right atrial  pressure of 3 mmHg, the estimated right ventricular systolic pressure is 03.8 mmHg. Left Atrium: Left atrial size was normal in size. Right Atrium: Right atrial size was normal in size. Pericardium: There is no evidence of pericardial effusion. Mitral Valve: The mitral valve is grossly normal. There is mild thickening of the mitral valve leaflet(s). There is mild calcification of the mitral valve leaflet(s). No evidence of mitral valve regurgitation. MV peak gradient, 4.4 mmHg. The mean mitral valve gradient is 1.0 mmHg. Tricuspid Valve: The tricuspid valve is normal in structure. Tricuspid valve regurgitation is trivial. Aortic Valve: The aortic valve is tricuspid. There is mild calcification of the aortic valve.  There is mild thickening of the aortic valve. Aortic valve regurgitation is not visualized. Aortic valve sclerosis/calcification is present, without any evidence of aortic stenosis. Aortic valve mean gradient measures 8.0 mmHg. Aortic valve peak gradient measures 12.2 mmHg. Aortic valve area, by VTI measures 1.80 cm. Pulmonic Valve: The pulmonic valve was normal in structure. Pulmonic valve regurgitation is trivial. Aorta: The aortic root and ascending aorta are structurally normal, with no evidence of dilitation. IAS/Shunts: The atrial septum is grossly normal. Agitated saline contrast was given intravenously to evaluate for intracardiac shunting. Agitated saline contrast bubble study was negative, with no evidence of any interatrial shunt.  LEFT VENTRICLE PLAX 2D LVIDd:         4.70 cm     Diastology LVIDs:         3.10 cm     LV e' medial:    5.55 cm/s LV PW:         1.30 cm     LV E/e' medial:  11.7 LV IVS:        1.00 cm     LV e' lateral:   6.64 cm/s LVOT diam:     2.00 cm     LV E/e' lateral: 9.8 LV SV:         79 LV SV Index:   34 LVOT Area:     3.14 cm  LV Volumes (MOD) LV vol d, MOD A2C: 46.2 ml LV vol d, MOD A4C: 70.5 ml LV vol s, MOD A2C: 19.5 ml LV vol s, MOD A4C: 30.4 ml LV SV MOD A2C:      26.8 ml LV SV MOD A4C:     70.5 ml LV SV MOD BP:      33.0 ml RIGHT VENTRICLE RV Basal diam:  3.25 cm RV Mid diam:    2.40 cm RV S prime:     9.03 cm/s TAPSE (M-mode): 1.9 cm LEFT ATRIUM           Index        RIGHT ATRIUM           Index LA diam:      4.10 cm 1.79 cm/m   RA Area:     15.60 cm LA Vol (A2C): 45.7 ml 19.99 ml/m  RA Volume:   38.40 ml  16.80 ml/m LA Vol (A4C): 59.2 ml 25.90 ml/m  AORTIC VALVE                     PULMONIC VALVE AV Area (Vmax):    1.66 cm      PV Vmax:       0.85 m/s AV Area (Vmean):   1.47 cm      PV Peak grad:  2.9 mmHg AV Area (VTI):     1.80 cm AV Vmax:           175.00 cm/s AV Vmean:          133.000 cm/s AV VTI:            0.437 m AV Peak Grad:      12.2 mmHg AV Mean Grad:      8.0 mmHg LVOT Vmax:         92.70 cm/s LVOT Vmean:        62.400 cm/s LVOT VTI:          0.250 m LVOT/AV VTI ratio: 0.57  AORTA Ao Root diam: 3.00 cm Ao Asc diam:  3.00 cm MITRAL VALVE  TRICUSPID VALVE MV Area (PHT): 2.83 cm    TR Peak grad:   10.5 mmHg MV Area VTI:   2.71 cm    TR Vmax:        162.00 cm/s MV Peak grad:  4.4 mmHg MV Mean grad:  1.0 mmHg    SHUNTS MV Vmax:       1.05 m/s    Systemic VTI:  0.25 m MV Vmean:      53.3 cm/s   Systemic Diam: 2.00 cm MV Decel Time: 268 msec MV E velocity: 65.10 cm/s MV A velocity: 95.10 cm/s MV E/A ratio:  0.68 Gwyndolyn Kaufman MD Electronically signed by Gwyndolyn Kaufman MD Signature Date/Time: 06/25/2021/11:50:38 AM    Final    US Carotid Bilateral  Result Date: 06/25/2021 CLINICAL DATA:  TIA Hypertension Prior right carotid endarterectomy Former tobacco user EXAM: BILATERAL CAROTID DUPLEX ULTRASOUND TECHNIQUE: Pearline Cables scale imaging, color Doppler and duplex ultrasound were performed of bilateral carotid and vertebral arteries in the neck. COMPARISON:  CT angiography neck 01/31/2019 FINDINGS: Criteria: Quantification of carotid stenosis is based on velocity parameters that correlate the residual internal carotid diameter with NASCET-based  stenosis levels, using the diameter of the distal internal carotid lumen as the denominator for stenosis measurement. The following velocity measurements were obtained: RIGHT ICA: 177/56 cm/sec CCA: 54/09 cm/sec SYSTOLIC ICA/CCA RATIO:  2.1 ECA: 129 cm/sec LEFT ICA: 328/104 cm/sec CCA: 811/91 cm/sec SYSTOLIC ICA/CCA RATIO:  2.1 ECA: 164 cm/sec RIGHT CAROTID ARTERY: No significant atheromatous plaque. RIGHT VERTEBRAL ARTERY:  Antegrade flow. LEFT CAROTID ARTERY: Moderate amount of intimal thickening noted in the mid left common carotid artery. Moderate heterogeneous partially calcified plaque seen in the proximal left internal carotid artery. LEFT VERTEBRAL ARTERY:  Antegrade flow. IMPRESSION: 1. Greater than 70% stenosis of the left internal carotid artery. 2. 50-69% stenosis of the right internal carotid artery. Electronically Signed   By: Miachel Roux M.D.   On: 06/25/2021 11:14   MR BRAIN WO CONTRAST  Result Date: 06/25/2021 CLINICAL DATA:  Neuro deficit, acute. Dizziness. Right-sided numbness. EXAM: MRI HEAD WITHOUT CONTRAST TECHNIQUE: Multiplanar, multiecho pulse sequences of the brain and surrounding structures were obtained without intravenous contrast. COMPARISON:  CT head without contrast 06/24/2021. MR head without contrast 01/31/2019 A remote cortical infarct of the precentral gyrus is stable. Minimal subjacent white matter changes stable. White matter is otherwise within normal limits. FINDINGS: Brain: No acute infarct, hemorrhage, or mass lesion is present. Minimal subjacent white matter change is also stable. No other significant white matter disease is present. The ventricles are of normal size. No significant extraaxial fluid collection is present. Basal ganglia are within normal limits. The brainstem and cerebellum are within normal limits. Vascular: Flow is present in the major intracranial arteries. Skull and upper cervical spine: The craniocervical junction is normal. Upper cervical spine is  within normal limits. Marrow signal is unremarkable. Sinuses/Orbits: The paranasal sinuses and mastoid air cells are clear. The globes and orbits are within normal limits. IMPRESSION: 1. No acute intracranial abnormality or significant interval change. 2. Stable remote cortical infarct of the right precentral gyrus. Electronically Signed   By: San Morelle M.D.   On: 06/25/2021 09:11   CT HEAD CODE STROKE WO CONTRAST  Result Date: 06/24/2021 CLINICAL DATA:  Code stroke.  Dizziness and right-sided numbness EXAM: CT HEAD WITHOUT CONTRAST TECHNIQUE: Contiguous axial images were obtained from the base of the skull through the vertex without intravenous contrast. RADIATION DOSE REDUCTION: This exam was performed according to  the departmental dose-optimization program which includes automated exposure control, adjustment of the mA and/or kV according to patient size and/or use of iterative reconstruction technique. COMPARISON:  01/31/2019 FINDINGS: Brain: No evidence of acute infarction, hemorrhage, cerebral edema, mass, mass effect, or midline shift. No hydrocephalus or extra-axial collection. Redemonstrated remote small posterior right frontal cortical infarcts. Vascular: No hyperdense vessel. Atherosclerotic calcifications in the intracranial carotid and vertebral arteries. Skull: Negative for fracture or focal lesion. Sinuses/Orbits: No acute finding. Other: The mastoid air cells are well aerated. ASPECTS [facility] Stroke Program Early CT Score) - Ganglionic level infarction (caudate, lentiform nuclei, internal capsule, insula, M1-M3 cortex): 7 - Supraganglionic infarction (M4-M6 cortex): 3 Total score (0-10 with 10 being normal): 10 IMPRESSION: 1. No acute intracranial process. 2. ASPECTS is 10 Code stroke imaging results were communicated on 06/24/2021 at 10:51 pm to provider ZACKOWSKI via telephone, who verbally acknowledged these results. Electronically Signed   By: Merilyn Baba M.D.   On: 06/24/2021  22:52    Microbiology: Results for orders placed or performed during the hospital encounter of 06/24/21  Resp Panel by RT-PCR (Flu A&B, Covid) Anterior Nasal Swab     Status: None   Collection Time: 06/24/21 11:50 PM   Specimen: Anterior Nasal Swab  Result Value Ref Range Status   SARS Coronavirus 2 by RT PCR NEGATIVE NEGATIVE Final    Comment: (NOTE) SARS-CoV-2 target nucleic acids are NOT DETECTED.  The SARS-CoV-2 RNA is generally detectable in upper respiratory specimens during the acute phase of infection. The lowest concentration of SARS-CoV-2 viral copies this assay can detect is 138 copies/mL. A negative result does not preclude SARS-Cov-2 infection and should not be used as the sole basis for treatment or other patient management decisions. A negative result may occur with  improper specimen collection/handling, submission of specimen other than nasopharyngeal swab, presence of viral mutation(s) within the areas targeted by this assay, and inadequate number of viral copies(<138 copies/mL). A negative result must be combined with clinical observations, patient history, and epidemiological information. The expected result is Negative.  Fact Sheet for Patients:  EntrepreneurPulse.com.au  Fact Sheet for Healthcare Providers:  IncredibleEmployment.be  This test is no t yet approved or cleared by the [place] FDA and  has been authorized for detection and/or diagnosis of SARS-CoV-2 by FDA under an Emergency Use Authorization (EUA). This EUA will remain  in effect (meaning this test can be used) for the duration of the COVID-19 declaration under Section 564(b)(1) of the Act, 21 U.S.C.section 360bbb-3(b)(1), unless the authorization is terminated  or revoked sooner.       Influenza A by PCR NEGATIVE NEGATIVE Final   Influenza B by PCR NEGATIVE NEGATIVE Final    Comment: (NOTE) The Xpert Xpress SARS-CoV-2/FLU/RSV plus assay is  intended as an aid in the diagnosis of influenza from Nasopharyngeal swab specimens and should not be used as a sole basis for treatment. Nasal washings and aspirates are unacceptable for Xpert Xpress SARS-CoV-2/FLU/RSV testing.  Fact Sheet for Patients: EntrepreneurPulse.com.au  Fact Sheet for Healthcare Providers: IncredibleEmployment.be  This test is not yet approved or cleared by the [place] FDA and has been authorized for detection and/or diagnosis of SARS-CoV-2 by FDA under an Emergency Use Authorization (EUA). This EUA will remain in effect (meaning this test can be used) for the duration of the COVID-19 declaration under Section 564(b)(1) of the Act, 21 U.S.C. section 360bbb-3(b)(1), unless the authorization is terminated or revoked.  Performed at [facility], [address]  [place], [place] [address]     Labs: CBC: Recent Labs  Lab 06/24/21 2224 06/25/21 0453  WBC 9.4 9.7  NEUTROABS 6.1  --   HGB 13.7 13.1  HCT 43.1 41.4  MCV 91.9 91.8  PLT 266 157   Basic Metabolic Panel: Recent Labs  Lab 06/24/21 2224 06/25/21 0453  NA 138 140  K 3.9 4.2  CL 103 106  CO2 28 29  GLUCOSE 104* 100*  BUN 20 18  CREATININE 0.91 0.79  CALCIUM 9.1 9.1  MG  --  2.1  PHOS  --  3.8   Liver Function Tests: Recent Labs  Lab 06/24/21 2224 06/25/21 0453  AST 15 13*  ALT 22 19  ALKPHOS 76 68  BILITOT 0.5 0.3  PROT 7.1 6.8  ALBUMIN 3.6 3.4*   CBG: Recent Labs  Lab 06/24/21 2239 06/25/21 0358 06/25/21 0821 06/25/21 1220  GLUCAP 136* 119* 90 116*    Discharge time spent: greater than 30 minutes.  Signed: Orson Eva, MD [facility] 06/25/2021

## 2021-06-25 NOTE — Assessment & Plan Note (Addendum)
Currently taking Zetia Previously had myalgias with Lipitor LDL 94 -pt is willing to try more water soluble statin in hopes of less myalgia--start pravastatin

## 2021-06-25 NOTE — Assessment & Plan Note (Addendum)
Presented with intermittent sensory disturbance -Appreciate Neurology Consult -PT/OT evaluation -Speech therapy eval -CT brain--negative -MRI brain--Stable remote cortical infarct of the right precentral gyrus -MRA brain--Progressive, moderate bilateral supraclinoid ICA stenoses; Unchanged severe bilateral P2 stenoses -Carotid Duplex--50-69% stenosis of the right internal carotid artery. Greater than 70% stenosis of the left internal carotid artery -Echo--EF 60-65%, no WMA, G1DD; no PFO -LDL--94 -HbA1C--6.1 -Antiplatelet--ASA + Plavix x 3 months, then plavix alone (pt was already on ASA PTA) -set up 30 day event monitor

## 2021-06-25 NOTE — TOC Progression Note (Signed)
Transition of Care [facility]) - Progression Note    Patient Details  Name: ROBBYE DEDE MRN: [id number] Date of Birth: 02-05-1960  Transition of Care [facility]) CM/SW Contact  Salome Arnt, Mathews Phone Number: 06/25/2021, 12:50 PM  Clinical Narrative:   Transition of Care [facility]) Screening Note   Patient Details  Name: JORDANA DUGUE Date of Birth: 1960-12-11   Transition of Care [facility]) CM/SW Contact:    Salome Arnt, LCSW Phone Number: 06/25/2021, 12:50 PM    Transition of Care Department [facility]) has reviewed patient and no TOC needs have been identified at this time. We will continue to monitor patient advancement through interdisciplinary progression rounds. If new patient transition needs arise, please place a TOC consult.            Expected Discharge Plan and Services                                                 Social Determinants of Health (SDOH) Interventions    Readmission Risk Interventions     No data to display

## 2021-06-25 NOTE — Assessment & Plan Note (Addendum)
Holding metformin NovoLog sliding scale Hemoglobin A1c 6.1

## 2021-06-25 NOTE — Assessment & Plan Note (Addendum)
Continue Synthroid °

## 2021-06-25 NOTE — Assessment & Plan Note (Signed)
Lifestyle modification BMI 44.22

## 2021-06-25 NOTE — Consult Note (Addendum)
I connected with  Angela Abbott on 06/25/21 by a video enabled telemedicine application and verified that I am speaking with the correct person using two identifiers.   I discussed the limitations of evaluation and management by telemedicine. The patient expressed understanding and agreed to proceed.  Location of patient: [facility] ([place]) Location of physician: [facility]/[place]   Neurology Consultation Reason for Consult: TIA Referring Physician: Dr. Shanon Brow Tat  CC: Sudden onset dizziness and right-sided numbness  History is obtained from: Patient, chart review  HPI: Angela Abbott is a 61 y.o. female with past medical history of stroke without residual deficits, hypertension, diabetes, hyperlipidemia, hypothyroidism\who presented to the emergency room with transient episodes of feeling off balance and right face, arm, leg numbness.  Patient states she was laying in her bed and about to get up when she felt off balance, numbness in right foot, right arm as well as right side of face.  Her symptoms lasted for couple of minutes and then resolved.  However she then had the same symptoms a few times over the next 1 hour, last symptoms at about 2230 yesterday evening.  No further symptoms since then.  Patient states she has been on aspirin 300 mg daily.  Denies any neck pain, lifting heavy stuff, recent visit to chiropractor.  Last known normal 2130 06/24/2021 No tpa as an NIHSS 0 No thrombectomy as NIHSS 0 Event happened at home mRS 0   ROS: All other systems reviewed and negative except as noted in the HPI.   Past Medical History:  Diagnosis Date   Arthritis    shoulders, knees, hips   Back pain    Bilateral swelling of feet and ankles    Carotid artery occlusion    Dental crowns present    GERD (gastroesophageal reflux disease)    Hypertension    Hypothyroidism    Knee pain    right knee   Left knee pain 07/2014   Osteoarthritis    Other fatigue    Pneumonia    hx  of 02/2017    Right knee pain    Shortness of breath on exertion    Stress incontinence    Stroke [facility])    speech difficulty recalling words   Wears partial dentures    upper and lower    Family History  Problem Relation Age of Onset   CVA Mother    Hypertension Father    CVA Other    Diabetes Other    CAD Other    Cancer Neg Hx    Social History:  reports that she quit smoking about 3 years ago. Her smoking use included cigarettes. She has a 46.00 pack-year smoking history. She has never used smokeless tobacco. She reports current alcohol use. She reports that she does not use drugs.  Medications Prior to Admission  Medication Sig Dispense Refill Last Dose   aspirin 325 MG tablet Take 1 tablet (325 mg total) by mouth daily. 30 tablet 0 06/24/2021   celecoxib (CELEBREX) 200 MG capsule Take 200 mg by mouth 2 (two) times daily.   06/24/2021   Cholecalciferol (VITAMIN D) 125 MCG (5000 UT) CAPS Take 125 mcg by mouth daily.   06/23/2021   citalopram (CELEXA) 10 MG tablet Take 10 mg by mouth daily.   06/23/2021   diltiazem (CARDIZEM CD) 180 MG 24 hr capsule Take 180 mg by mouth at bedtime.    06/23/2021   docusate sodium (COLACE) 100 MG capsule Take  100 mg by mouth in the morning.   06/23/2021   ezetimibe (ZETIA) 10 MG tablet Take 10 mg by mouth daily.   06/24/2021   Ferrous Sulfate Dried (HIGH POTENCY IRON) 65 MG TABS Take 1 tablet by mouth daily.   06/23/2021   gabapentin (NEURONTIN) 300 MG capsule Take 300 mg by mouth at bedtime.   06/23/2021   levothyroxine (SYNTHROID) 150 MCG tablet Take 150 mcg by mouth every morning.   06/23/2021   metFORMIN (GLUCOPHAGE) 500 MG tablet 1 po qd at night. (Patient taking differently: Take 500 mg by mouth daily with breakfast.) 30 tablet 0 06/24/2021   omeprazole (PRILOSEC) 40 MG capsule Take 1 capsule (40 mg total) by mouth daily. 30 capsule 0 06/23/2021   Vibegron 75 MG TABS Take 75 mg by mouth daily. 30 tablet 11 06/24/2021      Exam: Current vital  signs: BP (!) 143/74 (BP Location: Left Arm)   Pulse 71   Temp 98 F (36.7 C) (Oral)   Resp 19   Ht '5\' 6"'$  (1.676 m)   Wt 124.3 kg   SpO2 93%   BMI 44.22 kg/m  Vital signs in last 24 hours: Temp:  [97.8 F (36.6 C)-98.7 F (37.1 C)] 98 F (36.7 C) (06/19 0635) Pulse Rate:  [68-83] 71 (06/19 0635) Resp:  [15-22] 19 (06/19 0635) BP: (130-158)/(53-85) 143/74 (06/19 0635) SpO2:  [93 %-99 %] 93 % (06/19 0635) FiO2 (%):  [21 %] 21 % (06/19 0030) Weight:  [124.3 kg] 124.3 kg (06/18 2215)   Physical Exam  Constitutional: Appears well-developed and well-nourished.  Psych: Affect appropriate to situation Neuro: AOx3, no aphasia, cranial nerves II to XII grossly intact, antigravity strength in upper extremities without drift, FTN intact bilaterally, intact to light touch in all extremities  NIHSS 0  I have reviewed labs in epic and the results pertinent to this consultation are: CBC:  Recent Labs  Lab 06/24/21 2224 06/25/21 0453  WBC 9.4 9.7  NEUTROABS 6.1  --   HGB 13.7 13.1  HCT 43.1 41.4  MCV 91.9 91.8  PLT 266 048    Basic Metabolic Panel:  Lab Results  Component Value Date   NA 140 06/25/2021   K 4.2 06/25/2021   CO2 29 06/25/2021   GLUCOSE 100 (H) 06/25/2021   BUN 18 06/25/2021   CREATININE 0.79 06/25/2021   CALCIUM 9.1 06/25/2021   GFRNONAA >60 06/25/2021   GFRAA >60 01/31/2019   Lipid Panel:  Lab Results  Component Value Date   LDLCALC 94 06/25/2021   HgbA1c:  Lab Results  Component Value Date   HGBA1C 6.1 (H) 06/25/2021   Urine Drug Screen:     Component Value Date/Time   LABOPIA NONE DETECTED 06/25/2021 0001   COCAINSCRNUR NONE DETECTED 06/25/2021 0001   LABBENZ NONE DETECTED 06/25/2021 0001   AMPHETMU NONE DETECTED 06/25/2021 0001   THCU NONE DETECTED 06/25/2021 0001   LABBARB NONE DETECTED 06/25/2021 0001    Alcohol Level     Component Value Date/Time   ETH <10 06/24/2021 2225     I have reviewed the images obtained:  CT head  without contrast 06/24/2021: No acute intracranial process. MRI brain without contrast 06/15/2021: No acute intracranial abnormality or significant interval change. Stable remote cortical infarct of the right precentral gyrus.  ASSESSMENT/PLAN: 61 year old female with prior stroke and residual deficits who presented with transient dizziness and right-sided weakness.  Transient ischemic attack -Etiology: Likely due to small vessel disease   recommendations: -Recommend  MRA head without contrast to look for intracranial stenosis -Carotid ultrasound to look for carotid stenosis -TTE ordered and pending.  If no evidence of thrombus, recommend 30-day event monitor at the time of discharge -Continue aspirin 325 mg daily, Plavix 75 mg daily for now.  If there is evidence of intracranial stenosis, would recommend Plavix for 3 months.  If no intracranial stenosis, recommend Plavix for 3 weeks -Patient states atorvastatin gave her significant myalgias and is hesitant to try it again.  States she will take fish oil instead for now. -Goal blood pressure: Normotension -PT/OT/SLP -Stroke education, modification of risk factors -Follow-up with neurology in 3 months  Thank you for allowing [place] to participate in the care of this patient. If you have any further questions, please contact  me or neurohospitalist.   Zeb Comfort [facility] neurohospitalist

## 2021-06-25 NOTE — Assessment & Plan Note (Addendum)
Holding diltiazem to allow for permissive hypertension Restart after d/c

## 2021-06-25 NOTE — Evaluation (Signed)
Physical Therapy Evaluation Patient Details Name: Angela Abbott MRN: [id number] DOB: [birth year] Today's Date: 06/25/2021  History of Present Illness  Angela Abbott is a 61 y.o. female with medical history significant of prior stroke without any residual deficits, hypertension, GERD, hypothyroidism, type 2 diabetes mellitus, obesity who presents to the emergency department due to dizziness and imbalance associated with right-sided numbness of face, arm and leg which started while getting ready for bed around 9:45 PM, patient states that she sat down and symptoms resolved within a few minutes.  She complained of recurrence of symptoms within 5 minutes which appeared to last longer, she was concerned about stroke since symptoms were similar to an episode in 2019 when she was diagnosed to have CVA (after which she had a right-sided carotid endarterectomy), so she decided to go to the ED for further evaluation and management.  Symptoms have since resolved by the time she arrived at the ED.  She was a former smoker but she quit since 2019, patient denies alcohol or any recreational drug use.   Clinical Impression  Patient presents supine in bed and consents to PT evaluation. Patient is modified independent with bed mobility with slight labored movement and HOB elevated. Appropriate trunk control and stability observed. Patient is independent with sit to stand transfer with demonstrating appropriate strength and coordination to do so. Patient was able to ambulate 150 feet in hallway independently with no AD. Minor gait deviations observed but gait pattern WNL overall with slightly decreased in gait speed. Patient able to use bathroom independently indicating good return for home. Patient left in chair with nursing staff preparing transport for MRI. Nursing staff notified of mobility status. Patient discharged to care of nursing for ambulation daily as tolerated for length of stay.      Recommendations for  follow up therapy are one component of a multi-disciplinary discharge planning process, led by the attending physician.  Recommendations may be updated based on patient status, additional functional criteria and insurance authorization.  Follow Up Recommendations No PT follow up    Assistance Recommended at Discharge PRN  Patient can return home with the following  A little help with walking and/or transfers;Help with stairs or ramp for entrance    Equipment Recommendations None recommended by PT  Recommendations for Other Services       Functional Status Assessment Patient has not had a recent decline in their functional status     Precautions / Restrictions Precautions Precautions: None Restrictions Weight Bearing Restrictions: No      Mobility  Bed Mobility Overal bed mobility: Modified Independent             General bed mobility comments: Modified independent with bed mobility with slight labored movement but good trunk control. HOB elevated.    Transfers Overall transfer level: Independent Equipment used: None               General transfer comment: Independent with STS transfer. Appropriate strength and coordination.    Ambulation/Gait Ambulation/Gait assistance: Independent Gait Distance (Feet): 150 Feet Assistive device: None Gait Pattern/deviations: WFL(Within Functional Limits), Decreased step length - right, Decreased step length - left, Decreased stride length Gait velocity: slightly decreased     General Gait Details: Able to ambulate 150 feet in hall independently with no AD. Minor gait deviations but gait pattern generally WNL but slighty decreased gait speed. Good balance and coordination observed.  Stairs            Emergency planning/management officer  Modified Rankin (Stroke Patients Only)       Balance Overall balance assessment: Mild deficits observed, not formally tested                                            Pertinent Vitals/Pain Pain Assessment Pain Assessment: No/denies pain    Home Living Family/patient expects to be discharged to:: Private residence Living Arrangements: Children Available Help at Discharge: Family;Available PRN/intermittently;Available 24 hours/day Type of Home: House Home Access: Stairs to enter Entrance Stairs-Rails: Can reach both Entrance Stairs-Number of Steps: 4   Home Layout: One level Home Equipment: Cane - single point      Prior Function Prior Level of Function : Independent/Modified Independent             Mobility Comments: Patient reports being able to ambulate at home and in the community independently with no AD. Does use SPC when walking long distances. Currently driving. ADLs Comments: Patient reports being independent with ADL's and functional tasks.     Hand Dominance   Dominant Hand: Right    Extremity/Trunk Assessment   Upper Extremity Assessment Upper Extremity Assessment: Generalized weakness    Lower Extremity Assessment Lower Extremity Assessment: Generalized weakness    Cervical / Trunk Assessment Cervical / Trunk Assessment: Normal  Communication   Communication: No difficulties  Cognition Arousal/Alertness: Awake/alert Behavior During Therapy: WFL for tasks assessed/performed Overall Cognitive Status: Within Functional Limits for tasks assessed                                          General Comments      Exercises     Assessment/Plan    PT Assessment Patient does not need any further PT services  PT Problem List         PT Treatment Interventions      PT Goals (Current goals can be found in the Care Plan section)  Acute Rehab PT Goals Patient Stated Goal: return home PT Goal Formulation: With patient Time For Goal Achievement: 06/25/21 Potential to Achieve Goals: Good    Frequency       Co-evaluation               AM-PAC PT "6 Clicks" Mobility  Outcome Measure  Help needed turning from your back to your side while in a flat bed without using bedrails?: None Help needed moving from lying on your back to sitting on the side of a flat bed without using bedrails?: None Help needed moving to and from a bed to a chair (including a wheelchair)?: None Help needed standing up from a chair using your arms (e.g., wheelchair or bedside chair)?: None Help needed to walk in hospital room?: A Little Help needed climbing 3-5 steps with a railing? : A Little 6 Click Score: 22    End of Session   Activity Tolerance: Patient tolerated treatment well;No increased pain Patient left: in chair;with nursing/sitter in room Nurse Communication: Mobility status PT Visit Diagnosis: Unsteadiness on feet (R26.81);Other abnormalities of gait and mobility (R26.89);Muscle weakness (generalized) (M62.81)    Time: 5364-6803 PT Time Calculation (min) (ACUTE ONLY): 13 min   Charges:   PT Evaluation $PT Eval Low Complexity: 1 Low PT Treatments $Therapeutic Activity: 8-22 mins  2:19 PM, 06/25/21 Lestine Box, S/PT

## 2021-06-25 NOTE — Hospital Course (Signed)
61 year old female with a history of stroke, hypertension, diabetes mellitus type 2, hypothyroidism, GERD, carotid stenosis status post right carotid endarterectomy, tobacco abuse in remission presenting with numbness in her right forearm and right foot that started around 9:45 PM on 06/24/2021.  The patient states that she was walking back to her bedroom from her bathroom when she began experiencing some dizziness.  Subsequently, she sat down on her bed and developed numbness in her right forearm from her wrist to her fingers as well as her right foot.  It lasted about 2 to 3 minutes.  However after period of time her symptoms came back.  She walked to tell her son to take her to the emergency department when she felt like there was some heaviness in her right foot.  The patient continued to have some intermittent dizziness and numbness in her right forearm and foot.  She denies any visual disturbance, headache, dysarthria, or other focal weakness. She denies any fevers, chills, headache, neck pain, chest pain, shortness of breath, cough, hemoptysis, nausea, vomiting, diarrhea, abdominal pain, dysuria. In the ED, the patient was afebrile and hemodynamically stable with oxygen saturation 99% on room air.  BMP showed sodium 138, potassium 3.9, bicarbonate 28, serum creatinine 0.91.  LFTs were unremarkable.  WBC 9.4, hemoglobin 13.7, platelets 266,000.  EKG shows sinus rhythm with no ST or T wave changes.  UA was negative for pyuria.  Urine drug screen was negative.  CT of the brain was negative for acute findings but showed a remote right posterior frontal cortical infarct.  The patient was admitted for further stroke work-up.  Neurology was consulted to assist with management.

## 2021-06-25 NOTE — Progress Notes (Signed)
PROGRESS NOTE  Angela Abbott:[id number] DOB: 12/27/1960 DOA: 06/24/2021 PCP: Sharilyn Sites, MD  Brief History:  61 year old female with a history of stroke, hypertension, diabetes mellitus type 2, hypothyroidism, GERD, carotid stenosis status post right carotid endarterectomy, tobacco abuse in remission presenting with numbness in her right forearm and right foot that started around 9:45 PM on 06/24/2021.  The patient states that she was walking back to her bedroom from her bathroom when she began experiencing some dizziness.  Subsequently, she sat down on her bed and developed numbness in her right forearm from her wrist to her fingers as well as her right foot.  It lasted about 2 to 3 minutes.  However after period of time her symptoms came back.  She walked to tell her son to take her to the emergency department when she felt like there was some heaviness in her right foot.  The patient continued to have some intermittent dizziness and numbness in her right forearm and foot.  She denies any visual disturbance, headache, dysarthria, or other focal weakness. She denies any fevers, chills, headache, neck pain, chest pain, shortness of breath, cough, hemoptysis, nausea, vomiting, diarrhea, abdominal pain, dysuria. In the ED, the patient was afebrile and hemodynamically stable with oxygen saturation 99% on room air.  BMP showed sodium 138, potassium 3.9, bicarbonate 28, serum creatinine 0.91.  LFTs were unremarkable.  WBC 9.4, hemoglobin 13.7, platelets 266,000.  EKG shows sinus rhythm with no ST or T wave changes.  UA was negative for pyuria.  Urine drug screen was negative.  CT of the brain was negative for acute findings but showed a remote right posterior frontal cortical infarct.  The patient was admitted for further stroke work-up.  Neurology was consulted to assist with management.    Assessment and Plan: * Transient ischemic attack (TIA) Presented with intermittent sensory  disturbance -Appreciate Neurology Consult -PT/OT evaluation -Speech therapy eval -CT brain--negative -MRI brain-- -MRA brain-- -Carotid Duplex-- -Echo-- -LDL--94 -HbA1C-- -Antiplatelet--ASA + Plavix  Controlled type 2 diabetes mellitus without complication, without long-term current use of insulin (HCC) Holding metformin NovoLog sliding scale Hemoglobin A1c  Obesity, Class III, BMI 40-49.9 (morbid obesity) (HCC) Lifestyle modification BMI 44.22  Essential hypertension Holding diltiazem to allow for permissive hypertension  Hyperlipidemia Currently taking Zetia Previously had myalgias with Lipitor LDL 94  GERD (gastroesophageal reflux disease) Continue PPI  Acquired hypothyroidism Continue Synthroid Check TSH       Family Communication:   no Family at bedside  Consultants:  neurology  Code Status:  FULL  DVT Prophylaxis:  [place] Lovenox   Procedures: As Listed in Progress Note Above  Antibiotics: None       Subjective: Patient denies fevers, chills, headache, chest pain, dyspnea, nausea, vomiting, diarrhea, abdominal pain, dysuria, hematuria, hematochezia, and melena.   Objective: Vitals:   06/25/21 0036 06/25/21 0207 06/25/21 0400 06/25/21 0635  BP: (!) 144/77 (!) 139/53 136/62 (!) 143/74  Pulse: 68 71 70 71  Resp: '19 20 20 19  '$ Temp: 98.7 F (37.1 C) 98 F (36.7 C) 98 F (36.7 C) 98 F (36.7 C)  TempSrc: Oral Oral  Oral  SpO2: 99% 99% 98% 93%  Weight:      Height:       No intake or output data in the 24 hours ending 06/25/21 0733 Weight change:  Exam:  General:  Pt is alert, follows commands appropriately, not in acute distress HEENT: No icterus, No  thrush, No neck mass, [place]/AT Cardiovascular: RRR, S1/S2, no rubs, no gallops Respiratory: CTA bilaterally, no wheezing, no crackles, no rhonchi Abdomen: Soft/+BS, non tender, non distended, no guarding Extremities: No edema, No lymphangitis, No petechiae, No rashes, no  synovitis Neuro:  CN II-XII intact, strength 4/5 in RUE, RLE, strength 4/5 LUE, LLE; sensation intact bilateral; no dysmetria; babinski equivocal    Data Reviewed: I have personally reviewed following labs and imaging studies Basic Metabolic Panel: Recent Labs  Lab 06/24/21 2224 06/25/21 0453  NA 138 140  K 3.9 4.2  CL 103 106  CO2 28 29  GLUCOSE 104* 100*  BUN 20 18  CREATININE 0.91 0.79  CALCIUM 9.1 9.1  MG  --  2.1  PHOS  --  3.8   Liver Function Tests: Recent Labs  Lab 06/24/21 2224 06/25/21 0453  AST 15 13*  ALT 22 19  ALKPHOS 76 68  BILITOT 0.5 0.3  PROT 7.1 6.8  ALBUMIN 3.6 3.4*   No results for input(s): "LIPASE", "AMYLASE" in the last 168 hours. No results for input(s): "AMMONIA" in the last 168 hours. Coagulation Profile: Recent Labs  Lab 06/24/21 2224  INR 0.9   CBC: Recent Labs  Lab 06/24/21 2224 06/25/21 0453  WBC 9.4 9.7  NEUTROABS 6.1  --   HGB 13.7 13.1  HCT 43.1 41.4  MCV 91.9 91.8  PLT 266 242   Cardiac Enzymes: No results for input(s): "CKTOTAL", "CKMB", "CKMBINDEX", "TROPONINI" in the last 168 hours. BNP: Invalid input(s): "POCBNP" CBG: Recent Labs  Lab 06/24/21 2239  GLUCAP 136*   HbA1C: No results for input(s): "HGBA1C" in the last 72 hours. Urine analysis:    Component Value Date/Time   COLORURINE YELLOW 06/25/2021 0001   APPEARANCEUR CLEAR 06/25/2021 0001   LABSPEC 1.025 06/25/2021 0001   PHURINE 5.0 06/25/2021 0001   GLUCOSEU NEGATIVE 06/25/2021 0001   HGBUR SMALL (A) 06/25/2021 0001   BILIRUBINUR NEGATIVE 06/25/2021 0001   BILIRUBINUR Negative 04/16/2021 0857   KETONESUR NEGATIVE 06/25/2021 0001   PROTEINUR NEGATIVE 06/25/2021 0001   UROBILINOGEN 0.2 04/16/2021 0857   UROBILINOGEN 0.2 07/07/2013 1432   NITRITE NEGATIVE 06/25/2021 0001   LEUKOCYTESUR NEGATIVE 06/25/2021 0001   Sepsis Labs: '@LABRCNTIP'$ (procalcitonin:4,lacticidven:4) ) Recent Results (from the past 240 hour(s))  Resp Panel by RT-PCR (Flu  A&B, Covid) Anterior Nasal Swab     Status: None   Collection Time: 06/24/21 11:50 PM   Specimen: Anterior Nasal Swab  Result Value Ref Range Status   SARS Coronavirus 2 by RT PCR NEGATIVE NEGATIVE Final    Comment: (NOTE) SARS-CoV-2 target nucleic acids are NOT DETECTED.  The SARS-CoV-2 RNA is generally detectable in upper respiratory specimens during the acute phase of infection. The lowest concentration of SARS-CoV-2 viral copies this assay can detect is 138 copies/mL. A negative result does not preclude SARS-Cov-2 infection and should not be used as the sole basis for treatment or other patient management decisions. A negative result may occur with  improper specimen collection/handling, submission of specimen other than nasopharyngeal swab, presence of viral mutation(s) within the areas targeted by this assay, and inadequate number of viral copies(<138 copies/mL). A negative result must be combined with clinical observations, patient history, and epidemiological information. The expected result is Negative.  Fact Sheet for Patients:  EntrepreneurPulse.com.au  Fact Sheet for Healthcare Providers:  IncredibleEmployment.be  This test is no t yet approved or cleared by the [place] FDA and  has been authorized for detection and/or diagnosis of SARS-CoV-2 by  FDA under an Emergency Use Authorization (EUA). This EUA will remain  in effect (meaning this test can be used) for the duration of the COVID-19 declaration under Section 564(b)(1) of the Act, 21 U.S.C.section 360bbb-3(b)(1), unless the authorization is terminated  or revoked sooner.       Influenza A by PCR NEGATIVE NEGATIVE Final   Influenza B by PCR NEGATIVE NEGATIVE Final    Comment: (NOTE) The Xpert Xpress SARS-CoV-2/FLU/RSV plus assay is intended as an aid in the diagnosis of influenza from Nasopharyngeal swab specimens and should not be used as a sole basis for treatment.  Nasal washings and aspirates are unacceptable for Xpert Xpress SARS-CoV-2/FLU/RSV testing.  Fact Sheet for Patients: EntrepreneurPulse.com.au  Fact Sheet for Healthcare Providers: IncredibleEmployment.be  This test is not yet approved or cleared by the [place] FDA and has been authorized for detection and/or diagnosis of SARS-CoV-2 by FDA under an Emergency Use Authorization (EUA). This EUA will remain in effect (meaning this test can be used) for the duration of the COVID-19 declaration under Section 564(b)(1) of the Act, 21 U.S.C. section 360bbb-3(b)(1), unless the authorization is terminated or revoked.  Performed at [facility], [address], [place], [place] [address]      Scheduled Meds:  [START ON 06/26/2021]  stroke: early stages of recovery book   Does not apply Once   aspirin EC  81 mg Oral Daily   clopidogrel  75 mg Oral Daily   insulin aspart  0-15 Units Subcutaneous Q4H   levothyroxine  150 mcg Oral Q0600   pantoprazole  40 mg Oral Daily   Vibegron  75 mg Oral Daily   Continuous Infusions:  Procedures/Studies: CT HEAD CODE STROKE WO CONTRAST  Result Date: 06/24/2021 CLINICAL DATA:  Code stroke.  Dizziness and right-sided numbness EXAM: CT HEAD WITHOUT CONTRAST TECHNIQUE: Contiguous axial images were obtained from the base of the skull through the vertex without intravenous contrast. RADIATION DOSE REDUCTION: This exam was performed according to the departmental dose-optimization program which includes automated exposure control, adjustment of the mA and/or kV according to patient size and/or use of iterative reconstruction technique. COMPARISON:  01/31/2019 FINDINGS: Brain: No evidence of acute infarction, hemorrhage, cerebral edema, mass, mass effect, or midline shift. No hydrocephalus or extra-axial collection. Redemonstrated remote small posterior right frontal cortical infarcts. Vascular: No hyperdense vessel.  Atherosclerotic calcifications in the intracranial carotid and vertebral arteries. Skull: Negative for fracture or focal lesion. Sinuses/Orbits: No acute finding. Other: The mastoid air cells are well aerated. ASPECTS [facility] Stroke Program Early CT Score) - Ganglionic level infarction (caudate, lentiform nuclei, internal capsule, insula, M1-M3 cortex): 7 - Supraganglionic infarction (M4-M6 cortex): 3 Total score (0-10 with 10 being normal): 10 IMPRESSION: 1. No acute intracranial process. 2. ASPECTS is 10 Code stroke imaging results were communicated on 06/24/2021 at 10:51 pm to provider [email or web address] via telephone, who verbally acknowledged these results. Electronically Signed   By: Merilyn Baba M.D.   On: 06/24/2021 22:52    Orson Eva, DO  [facility]  If 7PM-7AM, please contact night-coverage [email or web address] Password [facility] 06/25/2021, 7:33 AM   LOS: 0 days

## 2021-06-25 NOTE — Plan of Care (Signed)

## 2021-06-25 NOTE — Progress Notes (Signed)
SLP Cancellation Note  Patient Details Name: Angela Abbott MRN: [id number] DOB: 07/08/1960   Cancelled treatment:       Reason Eval/Treat Not Completed: SLP screened, no needs identified, will sign off; SLP screened Pt in room. Pt denies any changes in swallowing, speech, language, or cognition. MRI negative for acute changes. SLE will be deferred at this time. Reconsult if indicated. SLP will sign off.   Thank you,  Genene Churn, [place]    [place] 06/25/2021, 2:18 PM

## 2021-06-25 NOTE — Assessment & Plan Note (Signed)
Continue PPI ?

## 2021-06-26 ENCOUNTER — Telehealth: Payer: Self-pay

## 2021-06-26 DIAGNOSIS — I639 Cerebral infarction, unspecified: Secondary | ICD-10-CM

## 2021-06-26 NOTE — Telephone Encounter (Signed)
Enrolled pt in [employer]. Monitor to be mailed to pt.

## 2021-06-26 NOTE — Telephone Encounter (Signed)
-----   Message from Orson Eva, MD sent at 06/26/2021  7:20 AM EDT ----- Can you please send 30 day monitor to this patient for stroke? Please send results to PCP Thank you

## 2021-07-20 DIAGNOSIS — I639 Cerebral infarction, unspecified: Secondary | ICD-10-CM | POA: Diagnosis not present

## 2021-07-24 ENCOUNTER — Ambulatory Visit (INDEPENDENT_AMBULATORY_CARE_PROVIDER_SITE_OTHER): Payer: 59

## 2021-07-24 NOTE — Addendum Note (Signed)
Addended by: Levonne Hubert on: 07/24/2021 04:17 PM   Modules accepted: Orders

## 2021-08-15 ENCOUNTER — Encounter (INDEPENDENT_AMBULATORY_CARE_PROVIDER_SITE_OTHER): Payer: Self-pay

## 2021-10-27 ENCOUNTER — Other Ambulatory Visit: Payer: Self-pay | Admitting: Obstetrics and Gynecology

## 2021-10-27 DIAGNOSIS — N3281 Overactive bladder: Secondary | ICD-10-CM

## 2022-04-19 ENCOUNTER — Ambulatory Visit (HOSPITAL_BASED_OUTPATIENT_CLINIC_OR_DEPARTMENT_OTHER): Payer: 59 | Admitting: Cardiology

## 2022-04-23 ENCOUNTER — Other Ambulatory Visit (HOSPITAL_BASED_OUTPATIENT_CLINIC_OR_DEPARTMENT_OTHER): Payer: Self-pay

## 2022-04-23 ENCOUNTER — Encounter (HOSPITAL_BASED_OUTPATIENT_CLINIC_OR_DEPARTMENT_OTHER): Payer: Self-pay | Admitting: Cardiology

## 2022-04-23 ENCOUNTER — Ambulatory Visit (HOSPITAL_BASED_OUTPATIENT_CLINIC_OR_DEPARTMENT_OTHER): Payer: 59 | Admitting: Cardiology

## 2022-04-23 VITALS — BP 132/82 | HR 81 | Ht 66.0 in | Wt 287.7 lb

## 2022-04-23 DIAGNOSIS — E1169 Type 2 diabetes mellitus with other specified complication: Secondary | ICD-10-CM | POA: Diagnosis not present

## 2022-04-23 DIAGNOSIS — Z8673 Personal history of transient ischemic attack (TIA), and cerebral infarction without residual deficits: Secondary | ICD-10-CM | POA: Diagnosis not present

## 2022-04-23 DIAGNOSIS — Z7189 Other specified counseling: Secondary | ICD-10-CM

## 2022-04-23 DIAGNOSIS — E7800 Pure hypercholesterolemia, unspecified: Secondary | ICD-10-CM

## 2022-04-23 DIAGNOSIS — Z7984 Long term (current) use of oral hypoglycemic drugs: Secondary | ICD-10-CM

## 2022-04-23 DIAGNOSIS — I10 Essential (primary) hypertension: Secondary | ICD-10-CM | POA: Diagnosis not present

## 2022-04-23 MED ORDER — MOUNJARO 10 MG/0.5ML ~~LOC~~ SOAJ
10.0000 mg | SUBCUTANEOUS | 0 refills | Status: DC
  Filled 2022-04-23: qty 2, 28d supply, fill #0

## 2022-04-23 NOTE — Patient Instructions (Signed)
Medication Instructions:  Your physician recommends that you continue on your current medications as directed. Please refer to the Current Medication list given to you today.  *If you need a refill on your cardiac medications before your next appointment, please call your pharmacy*  Follow-Up: At [facility], you and your health needs are our priority.  As part of our continuing mission to provide you with exceptional heart care, we have created designated Provider Care Teams.  These Care Teams include your primary Cardiologist (physician) and Advanced Practice Providers (APPs -  Physician Assistants and Nurse Practitioners) who all work together to provide you with the care you need, when you need it.  We recommend signing up for the patient portal called "MyChart".  Sign up information is provided on this After Visit Summary.  MyChart is used to connect with patients for Virtual Visits (Telemedicine).  Patients are able to view lab/test results, encounter notes, upcoming appointments, etc.  Non-urgent messages can be sent to your provider as well.   To learn more about what you can do with MyChart, go to [email or web address].    Your next appointment:   1 year(s)  Provider:   Bridgette Christopher, MD    

## 2022-04-23 NOTE — Progress Notes (Signed)
Cardiology Office Note:    Date:  04/23/2022   ID:  SEYNABOU BOOKOUT, DOB [birth year], MRN [id number]  PCP:  Assunta Found, MD  Cardiologist:  Jodelle Red, MD  Referring MD: Assunta Found, MD   CC: follow up  History of Present Illness:    Angela Abbott is a 63 y.o. female with a hx of hypertension, TIA/CVA with prior right CEA  who is seen for follow up today. I initially met her 09/28/19 as a new consult at the request of Assunta Found, MD for the evaluation and management of preoperative cardiovascular evaluation. Had been planned for bariatric surgery (prior lap band 2010, removed 2/2 reflux/dysphagia. Per note, discussed Roux-en-Y gastric bypass with Dr. Andrey Campanile.  History of CAD/PAD/CVA/TIA: Has history of R CEA 2019 for carotid disease. Follows with Dr. Myra Gianotti. Noted to have prior embolic infarcts in the right brain on MRI  Quit smoking in 2019, was a smoker for >40 years.  Today, the patient states that she has been taking Mounjaro after her lap band being removed. She has lost 18 lbs since starting it. She started smoking again but stopped recently. She had a recent ED admission where her foot and hands had started to get numb which turned out to be a CVA. She has been exercising more recently due to getting a dog. Her blood pressure in clinic is 142/100 on recheck it was 132/82.  She denies any palpitations, chest pain, shortness of breath, or peripheral edema. No lightheadedness, headaches, syncope, orthopnea, or PND.  Past Medical History:  Diagnosis Date   Arthritis    shoulders, knees, hips   Back pain    Bilateral swelling of feet and ankles    Carotid artery occlusion    Dental crowns present    GERD (gastroesophageal reflux disease)    Hypertension    Hypothyroidism    Knee pain    right knee   Left knee pain 07/2014   Osteoarthritis    Other fatigue    Pneumonia    hx of 02/2017    Right knee pain    Shortness of breath on exertion    Stress  incontinence    Stroke [facility])    speech difficulty recalling words   Wears partial dentures    upper and lower    Past Surgical History:  Procedure Laterality Date   ABDOMINAL HYSTERECTOMY     partial   CAROTID ENDARTERECTOMY     CESAREAN SECTION  1995, 1998   ENDARTERECTOMY Right 11/05/2017   Procedure: ENDARTERECTOMY CAROTID RIGHT;  Surgeon: Nada Libman, MD;  Location: [facility] OR;  Service: Vascular;  Laterality: Right;   ESOPHAGOGASTRODUODENOSCOPY N/A 11/26/2012   Procedure: ESOPHAGOGASTRODUODENOSCOPY (EGD);  Surgeon: Kandis Cocking, MD;  Location: [facility];  Service: General;  Laterality: N/A;   GANGLION CYST EXCISION Right 02/2008   foot   KNEE ARTHROSCOPY WITH LATERAL MENISECTOMY Left 07/21/2014   Procedure: LEFT KNEE ARTHROSCOPY,  CHONDROPLASTY, WITH LATERAL AND MEDIAL  MENISCECTOMIES;  Surgeon: Mckinley Jewel, MD;  Location: [facility];  Service: Orthopedics;  Laterality: Left;   KNEE ARTHROSCOPY WITH MEDIAL MENISECTOMY Left 07/21/2014   Procedure: KNEE ARTHROSCOPY WITH MEDIAL MENISECTOMY;  Surgeon: Mckinley Jewel, MD;  Location: [facility];  Service: Orthopedics;  Laterality: Left;   LAPAROSCOPIC GASTRIC BANDING  02/18/2008   LAPAROSCOPIC GASTRIC BANDING N/A 2010   LAPAROSCOPIC REPAIR AND REMOVAL OF GASTRIC BAND N/A 03/21/2017   Procedure: LAPAROSCOPIC REPAIR AND REMOVAL OF GASTRIC BAND;  Surgeon: Gaynelle Adu, MD;  Location: [facility];  Service: General;  Laterality: N/A;   PARTIAL HYSTERECTOMY  09/07/2002   PATCH ANGIOPLASTY Right 11/05/2017   Procedure: PATCH ANGIOPLASTY of right carotid artery using xenosure bovine pericardium patch;  Surgeon: Nada Libman, MD;  Location: [facility];  Service: Vascular;  Laterality: Right;   SHOULDER ARTHROSCOPY WITH SUBACROMIAL DECOMPRESSION, ROTATOR CUFF REPAIR AND BICEP TENDON REPAIR Right 11/23/2015   Procedure: RIGHT SHOULDER ARTHROSCOPY WITH DEBRIDEMENT, SUBACROMIAL DECOMPRESSION, DISTAL CLAVICLE EXCISION,  ROTATOR CUFF REPAIR AND BICEP TENODESIS;  Surgeon: Loreta Ave, MD;  Location: [facility];  Service: Orthopedics;  Laterality: Right;    Current Medications: Current Outpatient Medications on File Prior to Visit  Medication Sig   aspirin 325 MG tablet Take 1 tablet (325 mg total) by mouth daily.   celecoxib (CELEBREX) 200 MG capsule Take 200 mg by mouth 2 (two) times daily.   Cholecalciferol (VITAMIN D) 125 MCG (5000 UT) CAPS Take 125 mcg by mouth daily.   citalopram (CELEXA) 10 MG tablet Take 10 mg by mouth daily.   clopidogrel (PLAVIX) 75 MG tablet Take 1 tablet (75 mg total) by mouth daily.   diltiazem (CARDIZEM CD) 180 MG 24 hr capsule Take 180 mg by mouth at bedtime.    docusate sodium (COLACE) 100 MG capsule Take 100 mg by mouth in the morning.   ezetimibe (ZETIA) 10 MG tablet Take 10 mg by mouth daily.   Ferrous Sulfate Dried (HIGH POTENCY IRON) 65 MG TABS Take 1 tablet by mouth daily.   gabapentin (NEURONTIN) 300 MG capsule Take 300 mg by mouth at bedtime.   levothyroxine (SYNTHROID) 150 MCG tablet Take 150 mcg by mouth every morning.   metFORMIN (GLUCOPHAGE) 500 MG tablet 1 po qd at night. (Patient taking differently: Take 500 mg by mouth daily with breakfast.)   pravastatin (PRAVACHOL) 20 MG tablet Take 1 tablet (20 mg total) by mouth daily at 6 PM.   Vibegron 75 MG TABS Take 75 mg by mouth daily.   No current facility-administered medications on file prior to visit.     Allergies:   Bee venom   Social History   Tobacco Use   Smoking status: Former    Packs/day: 1.00    Years: 46.00    Additional pack years: 0.00    Total pack years: 46.00    Types: Cigarettes    Quit date: 10/27/2017    Years since quitting: 4.4   Smokeless tobacco: Never  Vaping Use   Vaping Use: Never used  Substance Use Topics   Alcohol use: Yes    Comment: occasionally   Drug use: No    Family History: family history includes CAD in an other family member; CVA in her  mother and another family member; Diabetes in an other family member; Hypertension in her father. There is no history of Cancer.  ROS:   Please see the history of present illness.    Additional ROS otherwise unremarkable.    EKGs/Labs/Other Studies Reviewed:    The following studies were reviewed today: Echo 10/28/17 - Left ventricle: The cavity size was normal. There was mild    concentric hypertrophy. Systolic function was normal. The    estimated ejection fraction was in the range of 60% to 65%. Wall    motion was normal; there were no regional wall motion    abnormalities. Doppler parameters are consistent with abnormal    left ventricular relaxation (grade 1 diastolic dysfunction).  -  Left atrium: The atrium was mildly dilated.   EKG:  EKG is personally reviewed.  04/23/22: NSR at 81 bpm 09/28/19: NSR 68 bpm  Recent Labs: 06/25/2021: ALT 19; BUN 18; Creatinine, Ser 0.79; Hemoglobin 13.1; Magnesium 2.1; Platelets 242; Potassium 4.2; Sodium 140  Recent Lipid Panel    Component Value Date/Time   CHOL 156 06/25/2021 0453   CHOL 154 08/08/2020 1049   TRIG 57 06/25/2021 0453   HDL 51 06/25/2021 0453   HDL 42 08/08/2020 1049   CHOLHDL 3.1 06/25/2021 0453   VLDL 11 06/25/2021 0453   LDLCALC 94 06/25/2021 0453   LDLCALC 93 08/08/2020 1049    Physical Exam:    VS:  BP 132/82 (BP Location: Right Arm, Patient Position: Sitting, Cuff Size: Large)   Pulse 81   Ht 5\' 6"  (1.676 m)   Wt 287 lb 11.2 oz (130.5 kg)   BMI 46.44 kg/m     Wt Readings from Last 3 Encounters:  04/23/22 287 lb 11.2 oz (130.5 kg)  06/24/21 274 lb (124.3 kg)  04/16/21 268 lb (121.6 kg)    GEN: Well nourished, well developed in no acute distress HEENT: Normal, moist mucous membranes NECK: No JVD CARDIAC: regular rhythm, normal S1 and S2, no rubs or gallops. No murmur. VASCULAR: Radial and DP pulses 2+ bilaterally. No carotid bruits RESPIRATORY:  Clear to auscultation without rales, wheezing or rhonchi   ABDOMEN: Soft, non-tender, non-distended MUSCULOSKELETAL:  Ambulates independently SKIN: Warm and dry, no edema NEUROLOGIC:  Alert and oriented x 3. No focal neuro deficits noted. PSYCHIATRIC:  Normal affect   ASSESSMENT:    1. History of CVA (cerebrovascular accident)   2. Essential hypertension   3. Obesity, Class III, BMI 40-49.9 (morbid obesity) (HCC)   4. Morbid obesity (HCC)   5. Type 2 diabetes mellitus with morbid obesity (HCC)   6. Pure hypercholesterolemia   7. Cardiac risk counseling   8. Counseling on health promotion and disease prevention     PLAN:    Hypertension: -elevated today, improved on recheck. Continue diltiazem for now.  Morbid obesity Type II diabetes -BMI 46. S/P lap band 2010 and then removal 2019. Had discussed Roux-en-Y surgery. On Mounjaro, tolerating -last A1c 9.6  Prior CVA/TIA, with history of carotid stenosis s/p R CEA Hypercholesterolemia -on aspirin, defer dose to Dr. Myra Gianotti -continue atorvastatin, LDL goal <70  Cardiac risk counseling and prevention recommendations: -recommend heart healthy/Mediterranean diet, with whole grains, fruits, vegetable, fish, lean meats, nuts, and olive oil. Limit salt. -recommend moderate walking, 3-5 times/week for 30-50 minutes each session. Aim for at least 150 minutes.week. Goal should be pace of 3 miles/hours, or walking 1.5 miles in 30 minutes -recommend avoidance of tobacco products. Avoid excess alcohol.  Plan for follow up: 1 year  Jodelle Red, MD, PhD, [facility]  [facility]    Medication Adjustments/Labs and Tests Ordered: Current medicines are reviewed at length with the patient today.  Concerns regarding medicines are outlined above.  Orders Placed This Encounter  Procedures   EKG 12-Lead   No orders of the defined types were placed in this encounter.   Patient Instructions  Medication Instructions:  Your physician recommends that you continue on your current  medications as directed. Please refer to the Current Medication list given to you today.  *If you need a refill on your cardiac medications before your next appointment, please call your pharmacy*   Follow-Up: At [facility], you and your health needs are our  priority.  As part of our continuing mission to provide you with exceptional heart care, we have created designated Provider Care Teams.  These Care Teams include your primary Cardiologist (physician) and Advanced Practice Providers (APPs -  Physician Assistants and Nurse Practitioners) who all work together to provide you with the care you need, when you need it.  We recommend signing up for the patient portal called "MyChart".  Sign up information is provided on this After Visit Summary.  MyChart is used to connect with patients for Virtual Visits (Telemedicine).  Patients are able to view lab/test results, encounter notes, upcoming appointments, etc.  Non-urgent messages can be sent to your provider as well.   To learn more about what you can do with MyChart, go to [email or web address].    Your next appointment:   1 year(s)  Provider:   Jodelle Red, MD      I,Coren O'Brien,acting as a scribe for Jodelle Red, MD.,have documented all relevant documentation on the behalf of Jodelle Red, MD,as directed by  Jodelle Red, MD while in the presence of Jodelle Red, MD.  I, Jodelle Red, MD, have reviewed all documentation for this visit. The documentation on 06/11/22 for the exam, diagnosis, procedures, and orders are all accurate and complete.

## 2022-05-31 ENCOUNTER — Ambulatory Visit: Payer: 59 | Admitting: Obstetrics and Gynecology

## 2022-05-31 ENCOUNTER — Encounter: Payer: Self-pay | Admitting: Obstetrics and Gynecology

## 2022-05-31 VITALS — BP 107/73 | HR 86

## 2022-05-31 DIAGNOSIS — N3281 Overactive bladder: Secondary | ICD-10-CM | POA: Diagnosis not present

## 2022-05-31 DIAGNOSIS — R350 Frequency of micturition: Secondary | ICD-10-CM

## 2022-05-31 LAB — POCT URINALYSIS DIPSTICK
Bilirubin, UA: NEGATIVE
Blood, UA: NEGATIVE
Glucose, UA: NEGATIVE
Ketones, UA: NEGATIVE
Leukocytes, UA: NEGATIVE
Nitrite, UA: NEGATIVE
Protein, UA: POSITIVE — AB
Spec Grav, UA: 1.02
Urobilinogen, UA: 0.2 U/dL
pH, UA: 7

## 2022-05-31 MED ORDER — VIBEGRON 75 MG PO TABS: 75.0000 mg | ORAL_TABLET | Freq: Every day | ORAL | 11 refills | Status: DC

## 2022-05-31 NOTE — Progress Notes (Signed)
[facility] Return Visit  SUBJECTIVE  History of Present Illness: Angela Abbott is a 62 y.o. female seen in follow-up for OAB. Plan at last visit was Continue on Gemtesa 75mg  daily and Urine micro 0-5RBC so no need for hematuria workup.   She reports she is happy on the Gemtesa 75mg  daily. Denies leaking, denies vaginal irritation.   Past Medical History: Patient  has a past medical history of Arthritis, Back pain, Bilateral swelling of feet and ankles, Carotid artery occlusion, Dental crowns present, GERD (gastroesophageal reflux disease), Hypertension, Hypothyroidism, Knee pain, Left knee pain (07/2014), Osteoarthritis, Other fatigue, Pneumonia, Right knee pain, Shortness of breath on exertion, Stress incontinence, Stroke (HCC), and Wears partial dentures.   Past Surgical History: She  has a past surgical history that includes Cesarean section (1995, 1998); Partial hysterectomy (09/07/2002); Laparoscopic gastric banding (02/18/2008); Ganglion cyst excision (Right, 02/2008); Esophagogastroduodenoscopy (N/A, 11/26/2012); Knee arthroscopy with lateral menisectomy (Left, 07/21/2014); Knee arthroscopy with medial menisectomy (Left, 07/21/2014); Abdominal hysterectomy; Shoulder arthroscopy with subacromial decompression, rotator cuff repair and bicep tendon repair (Right, 11/23/2015); Laparoscopic repair and removal of gastric band (N/A, 03/21/2017); Endarterectomy (Right, 11/05/2017); Patch angioplasty (Right, 11/05/2017); Carotid endarterectomy; and Laparoscopic gastric banding (N/A, 2010).   Medications: She has a current medication list which includes the following prescription(s): celecoxib, vitamin d, citalopram, clopidogrel, diltiazem, docusate sodium, ezetimibe, high potency iron, gabapentin, levothyroxine, metformin, pravastatin, mounjaro, and vibegron.   Allergies: Patient is allergic to bee venom.   Social History: Patient  reports that she quit smoking about 4 years  ago. Her smoking use included cigarettes. She has a 46.00 pack-year smoking history. She has never used smokeless tobacco. She reports current alcohol use. She reports that she does not use drugs.      OBJECTIVE     Physical Exam: Vitals:   05/31/22 1022  BP: 107/73  Pulse: 86   Gen: No apparent distress, A&O x 3.  Detailed Urogynecologic Evaluation:  Deferred. Prior exam showed:      No data to display             ASSESSMENT AND PLAN    Angela Abbott is a 62 y.o. with:  1. OAB (overactive bladder)   2. Urinary frequency    OAB: Patient to stay on Gemtesa 75mg  daily. Will have her follow up in one year.   POC urine negative for UTI and Blood.

## 2022-05-31 NOTE — Patient Instructions (Signed)
Continue on Gemtesa 75 mg daily for overactive bladder.

## 2022-06-05 ENCOUNTER — Other Ambulatory Visit (HOSPITAL_BASED_OUTPATIENT_CLINIC_OR_DEPARTMENT_OTHER): Payer: Self-pay

## 2022-06-05 MED ORDER — MOUNJARO 12.5 MG/0.5ML ~~LOC~~ SOAJ
SUBCUTANEOUS | 0 refills | Status: DC
  Filled 2022-06-05: qty 2, 28d supply, fill #0

## 2022-07-04 ENCOUNTER — Other Ambulatory Visit (HOSPITAL_BASED_OUTPATIENT_CLINIC_OR_DEPARTMENT_OTHER): Payer: Self-pay

## 2022-07-04 MED ORDER — MOUNJARO 12.5 MG/0.5ML ~~LOC~~ SOAJ
12.5000 mg | SUBCUTANEOUS | 0 refills | Status: DC
  Filled 2022-07-04: qty 2, 28d supply, fill #0

## 2022-07-15 ENCOUNTER — Emergency Department (HOSPITAL_COMMUNITY): Payer: 59

## 2022-07-15 ENCOUNTER — Other Ambulatory Visit: Payer: Self-pay

## 2022-07-15 ENCOUNTER — Emergency Department (HOSPITAL_COMMUNITY)
Admission: EM | Admit: 2022-07-15 | Discharge: 2022-07-15 | Disposition: A | Discharge status: Home / Self Care | Payer: 59 | Attending: Emergency Medicine | Admitting: Emergency Medicine

## 2022-07-15 ENCOUNTER — Encounter (HOSPITAL_COMMUNITY): Payer: Self-pay

## 2022-07-15 DIAGNOSIS — Z7902 Long term (current) use of antithrombotics/antiplatelets: Secondary | ICD-10-CM | POA: Diagnosis not present

## 2022-07-15 DIAGNOSIS — D72829 Elevated white blood cell count, unspecified: Secondary | ICD-10-CM | POA: Diagnosis not present

## 2022-07-15 DIAGNOSIS — R109 Unspecified abdominal pain: Secondary | ICD-10-CM | POA: Diagnosis present

## 2022-07-15 DIAGNOSIS — N12 Tubulo-interstitial nephritis, not specified as acute or chronic: Secondary | ICD-10-CM | POA: Diagnosis not present

## 2022-07-15 LAB — CBC WITH DIFFERENTIAL/PLATELET
Abs Immature Granulocytes: 0.05 10*3/uL (ref 0.00–0.07)
Basophils Absolute: 0 10*3/uL (ref 0.0–0.1)
Basophils Relative: 0 %
Eosinophils Absolute: 0 10*3/uL (ref 0.0–0.5)
Eosinophils Relative: 0 %
HCT: 45.3 % (ref 36.0–46.0)
Hemoglobin: 15.1 g/dL — ABNORMAL HIGH (ref 12.0–15.0)
Immature Granulocytes: 0 %
Lymphocytes Relative: 12 %
Lymphs Abs: 1.5 10*3/uL (ref 0.7–4.0)
MCH: 29.7 pg (ref 26.0–34.0)
MCHC: 33.3 g/dL (ref 30.0–36.0)
MCV: 89 fL (ref 80.0–100.0)
Monocytes Absolute: 0.8 10*3/uL (ref 0.1–1.0)
Monocytes Relative: 6 %
Neutro Abs: 10.4 10*3/uL — ABNORMAL HIGH (ref 1.7–7.7)
Neutrophils Relative %: 82 %
Platelets: 262 10*3/uL (ref 150–400)
RBC: 5.09 MIL/uL (ref 3.87–5.11)
RDW: 13.6 % (ref 11.5–15.5)
WBC: 12.9 10*3/uL — ABNORMAL HIGH (ref 4.0–10.5)
nRBC: 0 % (ref 0.0–0.2)

## 2022-07-15 LAB — COMPREHENSIVE METABOLIC PANEL
ALT: 28 U/L (ref 0–44)
AST: 21 U/L (ref 15–41)
Albumin: 3.7 g/dL (ref 3.5–5.0)
Alkaline Phosphatase: 78 U/L (ref 38–126)
Anion gap: 11 (ref 5–15)
BUN: 13 mg/dL (ref 8–23)
CO2: 27 mmol/L (ref 22–32)
Calcium: 9.3 mg/dL (ref 8.9–10.3)
Chloride: 98 mmol/L (ref 98–111)
Creatinine, Ser: 0.72 mg/dL (ref 0.44–1.00)
GFR, Estimated: >60 mL/min (ref >60)
Glucose, Bld: 132 mg/dL — ABNORMAL HIGH (ref 70–99)
Potassium: 4.1 mmol/L (ref 3.5–5.1)
Sodium: 136 mmol/L (ref 135–145)
Total Bilirubin: 0.8 mg/dL (ref 0.3–1.2)
Total Protein: 7.5 g/dL (ref 6.5–8.1)

## 2022-07-15 LAB — URINALYSIS, ROUTINE W REFLEX MICROSCOPIC
Bilirubin Urine: NEGATIVE
Glucose, UA: NEGATIVE mg/dL
Hgb urine dipstick: NEGATIVE
Ketones, ur: NEGATIVE mg/dL
Leukocytes,Ua: NEGATIVE
Nitrite: NEGATIVE
Protein, ur: 30 mg/dL — AB
Specific Gravity, Urine: 1.016 (ref 1.005–1.030)
pH: 8 (ref 5.0–8.0)

## 2022-07-15 LAB — LIPASE, BLOOD: Lipase: 23 U/L (ref 11–51)

## 2022-07-15 MED ORDER — MORPHINE SULFATE (PF) 4 MG/ML IV SOLN
4.0000 mg | Freq: Once | INTRAVENOUS | Status: AC
  Administered 2022-07-15: 4 mg via INTRAVENOUS
  Filled 2022-07-15: qty 1

## 2022-07-15 MED ORDER — ONDANSETRON HCL 4 MG/2ML IJ SOLN
4.0000 mg | Freq: Once | INTRAMUSCULAR | Status: AC
  Administered 2022-07-15: 4 mg via INTRAVENOUS
  Filled 2022-07-15: qty 2

## 2022-07-15 MED ORDER — IOHEXOL 300 MG/ML  SOLN
100.0000 mL | Freq: Once | INTRAMUSCULAR | Status: AC | PRN
  Administered 2022-07-15: 100 mL via INTRAVENOUS

## 2022-07-15 MED ORDER — HYDROCODONE-ACETAMINOPHEN 5-325 MG PO TABS: 1.0000 | ORAL_TABLET | ORAL | 0 refills | Status: DC | PRN

## 2022-07-15 MED ORDER — LEVOFLOXACIN 500 MG PO TABS
500.0000 mg | ORAL_TABLET | Freq: Once | ORAL | Status: AC
  Administered 2022-07-15: 500 mg via ORAL
  Filled 2022-07-15: qty 1

## 2022-07-15 MED ORDER — LEVOFLOXACIN 500 MG PO TABS: 500.0000 mg | ORAL_TABLET | Freq: Every day | ORAL | 0 refills | Status: DC

## 2022-07-15 NOTE — ED Triage Notes (Signed)
Pt arrived via POV c/o left flank pain. Pt reports she had a Monjaro shot Saturday, had a BM last night, endorse N/V.

## 2022-07-15 NOTE — ED Provider Notes (Addendum)
[name] EMERGENCY DEPARTMENT AT [facility] Provider Note   CSN: [id number] Arrival date & time: 07/15/22  0236     History  Chief Complaint  Patient presents with   Flank Pain    Angela Abbott is a 62 y.o. female.  Patient presents to the emergency department for evaluation of left flank pain.  Patient with nausea and vomiting as well.  She did have a Mounjaro shot yesterday.  She has been taking this for some time.  No diarrhea.  No urinary symptoms.       Home Medications Prior to Admission medications   Medication Sig Start Date End Date Taking? Authorizing Provider  levofloxacin (LEVAQUIN) 500 MG tablet Take 1 tablet (500 mg total) by mouth daily. 07/15/22  Yes , Canary Brim, MD  celecoxib (CELEBREX) 200 MG capsule Take 200 mg by mouth 2 (two) times daily.    [provider]  Cholecalciferol (VITAMIN D) 125 MCG (5000 UT) CAPS Take 125 mcg by mouth daily.    [provider]  citalopram (CELEXA) 10 MG tablet Take 10 mg by mouth daily. 02/11/19   [provider]  clopidogrel (PLAVIX) 75 MG tablet Take 1 tablet (75 mg total) by mouth daily. 06/26/21   Catarina Hartshorn, MD  diltiazem (CARDIZEM CD) 180 MG 24 hr capsule Take 180 mg by mouth at bedtime.  10/31/17   [provider]  docusate sodium (COLACE) 100 MG capsule Take 100 mg by mouth in the morning.    [provider]  ezetimibe (ZETIA) 10 MG tablet Take 10 mg by mouth daily. 06/05/21   [provider]  Ferrous Sulfate Dried (HIGH POTENCY IRON) 65 MG TABS Take 1 tablet by mouth daily.    [provider]  gabapentin (NEURONTIN) 300 MG capsule Take 300 mg by mouth at bedtime. 02/11/19   [provider]  levothyroxine (SYNTHROID) 175 MCG tablet Take 175 mcg by mouth daily before breakfast.    [provider]  metFORMIN (GLUCOPHAGE) 500 MG tablet 1 po qd at night. Patient taking differently: Take 500 mg by mouth daily with breakfast.  02/22/21   Corinna Capra A, DO  pravastatin (PRAVACHOL) 20 MG tablet Take 1 tablet (20 mg total) by mouth daily at 6 PM. 06/25/21   Tat, Onalee Hua, MD  tirzepatide [facility]) 12.5 MG/0.5ML Pen Inject 12.5 mg into the skin every 7 (seven) days. 07/04/22     Vibegron 75 MG TABS Take 1 tablet (75 mg total) by mouth daily. 05/31/22   Selmer Dominion, NP      Allergies    Bee venom    Review of Systems   Review of Systems  Physical Exam Updated Vital Signs BP 115/71 (BP Location: Right Arm)   Pulse 86   Temp 97.7 F (36.5 C) (Oral)   Resp 18   Ht 5\' 6"  (1.676 m)   Wt 132 kg   SpO2 94%   BMI 46.97 kg/m  Physical Exam Vitals and nursing note reviewed.  Constitutional:      General: She is not in acute distress.    Appearance: She is well-developed.  HENT:     Head: Normocephalic and atraumatic.     Mouth/Throat:     Mouth: Mucous membranes are moist.  Eyes:     General: Vision grossly intact. Gaze aligned appropriately.     Extraocular Movements: Extraocular movements intact.     Conjunctiva/sclera: Conjunctivae normal.  Cardiovascular:     Rate and Rhythm: Normal  rate and regular rhythm.     Pulses: Normal pulses.     Heart sounds: Normal heart sounds, S1 normal and S2 normal. No murmur heard.    No friction rub. No gallop.  Pulmonary:     Effort: Pulmonary effort is normal. No respiratory distress.     Breath sounds: Normal breath sounds.  Abdominal:     General: Bowel sounds are normal.     Palpations: Abdomen is soft.     Tenderness: There is abdominal tenderness in the left lower quadrant. There is no guarding or rebound.     Hernia: No hernia is present.  Musculoskeletal:        General: No swelling.     Cervical back: Full passive range of motion without pain, normal range of motion and neck supple. No spinous process tenderness or muscular tenderness. Normal range of motion.     Right lower leg: No edema.     Left lower leg: No edema.  Skin:    General: Skin is warm  and dry.     Capillary Refill: Capillary refill takes less than 2 seconds.     Findings: No ecchymosis, erythema, rash or wound.  Neurological:     General: No focal deficit present.     Mental Status: She is alert and oriented to person, place, and time.     GCS: GCS eye subscore is 4. GCS verbal subscore is 5. GCS motor subscore is 6.     Cranial Nerves: Cranial nerves 2-12 are intact.     Sensory: Sensation is intact.     Motor: Motor function is intact.     Coordination: Coordination is intact.  Psychiatric:        Attention and Perception: Attention normal.        Mood and Affect: Mood normal.        Speech: Speech normal.        Behavior: Behavior normal.     ED Results / Procedures / Treatments   Labs (all labs ordered are listed, but only abnormal results are displayed) Labs Reviewed  CBC WITH DIFFERENTIAL/PLATELET - Abnormal; Notable for the following components:      Result Value   WBC 12.9 (*)    Hemoglobin 15.1 (*)    Neutro Abs 10.4 (*)    All other components within normal limits  COMPREHENSIVE METABOLIC PANEL - Abnormal; Notable for the following components:   Glucose, Bld 132 (*)    All other components within normal limits  URINALYSIS, ROUTINE W REFLEX MICROSCOPIC - Abnormal; Notable for the following components:   Protein, ur 30 (*)    Bacteria, UA RARE (*)    All other components within normal limits  LIPASE, BLOOD    EKG None  Radiology CT ABDOMEN PELVIS W CONTRAST  Result Date: 07/15/2022 CLINICAL DATA:  Left lower quadrant abdominal pain. Left flank pain. EXAM: CT ABDOMEN AND PELVIS WITH CONTRAST TECHNIQUE: Multidetector CT imaging of the abdomen and pelvis was performed using the standard protocol following bolus administration of intravenous contrast. RADIATION DOSE REDUCTION: This exam was performed according to the departmental dose-optimization program which includes automated exposure control, adjustment of the mA and/or kV according to patient  size and/or use of iterative reconstruction technique. CONTRAST:  OMNIPAQUE IOHEXOL 300 MG/ML  SOLN COMPARISON:  02/14/2017. FINDINGS: Lower chest: Atelectasis is present at the lung bases. Hepatobiliary: No focal liver abnormality is seen. No gallstones, gallbladder wall thickening, or biliary dilatation. Pancreas: Unremarkable. No pancreatic ductal  dilatation or surrounding inflammatory changes. Spleen: Normal in size without focal abnormality. Adrenals/Urinary Tract: The adrenal glands are within normal limits. A slightly delayed nephrogram is noted on the left. A few punctate renal calculi are seen bilaterally. No ureteral calculus or obstructive uropathy bilaterally. Fat stranding is noted about the left renal pelvis. Parapelvic cysts are noted on the left. The bladder is unremarkable. Stomach/Bowel: Small hiatal hernia seen. Stomach is within normal limits. Appendix appears normal. No evidence of bowel wall thickening, distention, or inflammatory changes. No free air or pneumatosis. Scattered diverticula are noted along the sigmoid colon without evidence of diverticulitis. Vascular/Lymphatic: Aortic atherosclerosis. No enlarged abdominal or pelvic lymph nodes. Reproductive: Status post hysterectomy. No adnexal masses. Other: No abdominopelvic ascites. Fat containing umbilical hernia is present. Musculoskeletal: Degenerative changes are present in the thoracolumbar spine. No acute osseous abnormality. IMPRESSION: 1. Slightly delayed nephrogram on the left with fat stranding at the left renal pelvis, which may be infectious or inflammatory. 2. Bilateral nephrolithiasis with no ureteral calculus or obstructive uropathy. 3. Small hiatal hernia. 4. Diverticulosis without diverticulitis. 5. Aortic atherosclerosis. Electronically Signed   By: Thornell Sartorius M.D.   On: 07/15/2022 05:02    Procedures Procedures    Medications Ordered in ED Medications  morphine (PF) 4 MG/ML injection 4 mg (4 mg Intravenous  Given 07/15/22 0321)  ondansetron (ZOFRAN) injection 4 mg (4 mg Intravenous Given 07/15/22 0322)  iohexol (OMNIPAQUE) 300 MG/ML solution 100 mL (100 mLs Intravenous Contrast Given 07/15/22 0405)    ED Course/ Medical Decision Making/ A&P                             Medical Decision Making Amount and/or Complexity of Data Reviewed External Data Reviewed: notes. Labs: ordered. Decision-making details documented in ED Course. Radiology: ordered and independent interpretation performed. Decision-making details documented in ED Course.  Risk Prescription drug management.   Differential Diagnosis considered includes, but not limited to: Appendicitis; colitis; diverticulitis; bowel obstruction; cystitis; nephrolithiasis; pyelonephritis.   Presents to the emergency department for evaluation of left flank pain.  Patient without any obvious urinary symptoms.  Patient does report that she took her [facility] yesterday, but has tolerated this in the past without difficulty.  Examination did reveal some left-sided tenderness, particularly in the lower quadrant.  No guarding, rebound or signs of peritonitis.  Lab work unremarkable other than mild leukocytosis.  Patient underwent CT scan to further evaluate.  There is some stranding around the left kidney which could potentially cause left-sided pain.  Urinalysis does not look particularly infected but will need to cover empirically.  Additionally she does have diverticulosis without diverticulitis.  With the left lower quadrant tenderness, would like to tailor antibiotics to cover for pyelonephritis and diverticulitis, will prescribe Levaquin.  Is pain-free currently.  She did receive a single dose of morphine.  Oxygen dropped after the morphine, she is now awake and alert.  I suppose a third consideration would be a recently passed kidney stone as she does have nephrolithiasis.      Final Clinical Impression(s) / ED Diagnoses Final diagnoses:   Pyelonephritis    Rx / DC Orders ED Discharge Orders          Ordered    levofloxacin (LEVAQUIN) 500 MG tablet  Daily        07/15/22 0510              Gilda Crease, MD 07/15/22 [phone]  Gilda Crease, MD 07/15/22 [phone]

## 2022-07-15 NOTE — ED Notes (Signed)
ED Provider at bedside. 

## 2022-07-16 MED FILL — Hydrocodone-Acetaminophen Tab 5-325 MG: ORAL | Qty: 6 | Status: AC

## 2022-08-31 ENCOUNTER — Other Ambulatory Visit (HOSPITAL_BASED_OUTPATIENT_CLINIC_OR_DEPARTMENT_OTHER): Payer: Self-pay

## 2022-08-31 MED ORDER — MOUNJARO 12.5 MG/0.5ML ~~LOC~~ SOAJ
12.5000 mg | SUBCUTANEOUS | 2 refills | Status: DC
  Filled 2022-08-31 – 2022-09-02 (×2): qty 2, 28d supply, fill #0

## 2022-09-02 ENCOUNTER — Other Ambulatory Visit: Payer: Self-pay

## 2022-09-02 ENCOUNTER — Other Ambulatory Visit (HOSPITAL_BASED_OUTPATIENT_CLINIC_OR_DEPARTMENT_OTHER): Payer: Self-pay

## 2022-09-07 ENCOUNTER — Emergency Department (HOSPITAL_COMMUNITY)
Admission: EM | Admit: 2022-09-07 | Discharge: 2022-09-07 | Disposition: A | Discharge status: Home / Self Care | Payer: 59 | Attending: Emergency Medicine | Admitting: Emergency Medicine

## 2022-09-07 ENCOUNTER — Encounter (HOSPITAL_COMMUNITY): Payer: Self-pay

## 2022-09-07 ENCOUNTER — Other Ambulatory Visit: Payer: Self-pay

## 2022-09-07 ENCOUNTER — Emergency Department (HOSPITAL_COMMUNITY): Payer: 59

## 2022-09-07 DIAGNOSIS — D72829 Elevated white blood cell count, unspecified: Secondary | ICD-10-CM | POA: Diagnosis not present

## 2022-09-07 DIAGNOSIS — R8281 Pyuria: Secondary | ICD-10-CM | POA: Diagnosis not present

## 2022-09-07 DIAGNOSIS — N23 Unspecified renal colic: Secondary | ICD-10-CM

## 2022-09-07 DIAGNOSIS — Z7902 Long term (current) use of antithrombotics/antiplatelets: Secondary | ICD-10-CM | POA: Diagnosis not present

## 2022-09-07 DIAGNOSIS — R319 Hematuria, unspecified: Secondary | ICD-10-CM | POA: Insufficient documentation

## 2022-09-07 DIAGNOSIS — Z7984 Long term (current) use of oral hypoglycemic drugs: Secondary | ICD-10-CM | POA: Insufficient documentation

## 2022-09-07 DIAGNOSIS — N132 Hydronephrosis with renal and ureteral calculous obstruction: Secondary | ICD-10-CM | POA: Insufficient documentation

## 2022-09-07 DIAGNOSIS — R109 Unspecified abdominal pain: Secondary | ICD-10-CM | POA: Diagnosis present

## 2022-09-07 DIAGNOSIS — N201 Calculus of ureter: Secondary | ICD-10-CM

## 2022-09-07 DIAGNOSIS — E119 Type 2 diabetes mellitus without complications: Secondary | ICD-10-CM | POA: Diagnosis not present

## 2022-09-07 LAB — URINALYSIS, ROUTINE W REFLEX MICROSCOPIC
Bilirubin Urine: NEGATIVE
Glucose, UA: NEGATIVE mg/dL
Ketones, ur: NEGATIVE mg/dL
Nitrite: NEGATIVE
Protein, ur: 100 mg/dL — AB
RBC / HPF: >50 RBC/hpf (ref 0–5)
Specific Gravity, Urine: 1.024 (ref 1.005–1.030)
pH: 5 (ref 5.0–8.0)

## 2022-09-07 LAB — COMPREHENSIVE METABOLIC PANEL
ALT: 24 U/L (ref 0–44)
AST: 14 U/L — ABNORMAL LOW (ref 15–41)
Albumin: 3.4 g/dL — ABNORMAL LOW (ref 3.5–5.0)
Alkaline Phosphatase: 72 U/L (ref 38–126)
Anion gap: 7 (ref 5–15)
BUN: 16 mg/dL (ref 8–23)
CO2: 30 mmol/L (ref 22–32)
Calcium: 9.4 mg/dL (ref 8.9–10.3)
Chloride: 102 mmol/L (ref 98–111)
Creatinine, Ser: 0.81 mg/dL (ref 0.44–1.00)
GFR, Estimated: >60 mL/min (ref >60)
Glucose, Bld: 140 mg/dL — ABNORMAL HIGH (ref 70–99)
Potassium: 3.9 mmol/L (ref 3.5–5.1)
Sodium: 139 mmol/L (ref 135–145)
Total Bilirubin: 0.5 mg/dL (ref 0.3–1.2)
Total Protein: 7 g/dL (ref 6.5–8.1)

## 2022-09-07 LAB — CBC WITH DIFFERENTIAL/PLATELET
Abs Immature Granulocytes: 0.04 10*3/uL (ref 0.00–0.07)
Basophils Absolute: 0.1 10*3/uL (ref 0.0–0.1)
Basophils Relative: 0 %
Eosinophils Absolute: 0 10*3/uL (ref 0.0–0.5)
Eosinophils Relative: 0 %
HCT: 44.1 % (ref 36.0–46.0)
Hemoglobin: 14.3 g/dL (ref 12.0–15.0)
Immature Granulocytes: 0 %
Lymphocytes Relative: 10 %
Lymphs Abs: 1.3 10*3/uL (ref 0.7–4.0)
MCH: 29.9 pg (ref 26.0–34.0)
MCHC: 32.4 g/dL (ref 30.0–36.0)
MCV: 92.3 fL (ref 80.0–100.0)
Monocytes Absolute: 0.8 10*3/uL (ref 0.1–1.0)
Monocytes Relative: 6 %
Neutro Abs: 10.9 10*3/uL — ABNORMAL HIGH (ref 1.7–7.7)
Neutrophils Relative %: 84 %
Platelets: 265 10*3/uL (ref 150–400)
RBC: 4.78 MIL/uL (ref 3.87–5.11)
RDW: 14.4 % (ref 11.5–15.5)
WBC: 13.2 10*3/uL — ABNORMAL HIGH (ref 4.0–10.5)
nRBC: 0 % (ref 0.0–0.2)

## 2022-09-07 LAB — LIPASE, BLOOD: Lipase: 25 U/L (ref 11–51)

## 2022-09-07 MED ORDER — ONDANSETRON HCL 4 MG/2ML IJ SOLN
4.0000 mg | Freq: Once | INTRAMUSCULAR | Status: AC
  Administered 2022-09-07: 4 mg via INTRAVENOUS
  Filled 2022-09-07: qty 2

## 2022-09-07 MED ORDER — TAMSULOSIN HCL 0.4 MG PO CAPS: 0.4000 mg | ORAL_CAPSULE | Freq: Every day | ORAL | 0 refills | Status: DC

## 2022-09-07 MED ORDER — MORPHINE SULFATE (PF) 4 MG/ML IV SOLN
4.0000 mg | Freq: Once | INTRAVENOUS | Status: AC
  Administered 2022-09-07: 4 mg via INTRAVENOUS
  Filled 2022-09-07: qty 1

## 2022-09-07 MED ORDER — ONDANSETRON 8 MG PO TBDP: 8.0000 mg | ORAL_TABLET | Freq: Three times a day (TID) | ORAL | 0 refills | Status: DC | PRN

## 2022-09-07 MED ORDER — HYDROCODONE-ACETAMINOPHEN 5-325 MG PO TABS
1.0000 | ORAL_TABLET | ORAL | 0 refills | Status: DC | PRN
Start: 2022-09-07 — End: 2023-01-24

## 2022-09-07 MED ORDER — HYDROCODONE-ACETAMINOPHEN 5-325 MG PO TABS: 1.0000 | ORAL_TABLET | ORAL | 0 refills | Status: DC | PRN

## 2022-09-07 MED ORDER — HYDROCODONE-ACETAMINOPHEN 5-325 MG PO TABS
1.0000 | ORAL_TABLET | ORAL | 0 refills | Status: DC | PRN
Start: 2022-09-07 — End: 2022-09-07

## 2022-09-07 NOTE — ED Notes (Signed)
Patient transported to CT 

## 2022-09-07 NOTE — ED Provider Notes (Signed)
[place] EMERGENCY DEPARTMENT AT [facility] Provider Note   CSN: [id number] Arrival date & time: 09/07/22  4098     History  Chief Complaint  Patient presents with   Flank Pain    Angela Abbott is a 62 y.o. female.  The history is provided by the patient.  Flank Pain  She was awakened at 9:30 AM by pain in the left flank with associated nausea and vomiting.  She took a dose of hydrocodone-acetaminophen with slight, temporary relief.  She had a similar episode 2 months ago and was treated with antibiotics.  This episode is worse.  She denies fever or chills.  Denies urinary urgency, frequency, tenesmus, dysuria.  She denies constipation or diarrhea.   Home Medications Prior to Admission medications   Medication Sig Start Date End Date Taking? Authorizing Provider  celecoxib (CELEBREX) 200 MG capsule Take 200 mg by mouth 2 (two) times daily.    [provider]  Cholecalciferol (VITAMIN D) 125 MCG (5000 UT) CAPS Take 125 mcg by mouth daily.    [provider]  citalopram (CELEXA) 10 MG tablet Take 10 mg by mouth daily. 02/11/19   [provider]  clopidogrel (PLAVIX) 75 MG tablet Take 1 tablet (75 mg total) by mouth daily. 06/26/21   Catarina Hartshorn, MD  diltiazem (CARDIZEM CD) 180 MG 24 hr capsule Take 180 mg by mouth at bedtime.  10/31/17   [provider]  docusate sodium (COLACE) 100 MG capsule Take 100 mg by mouth in the morning.    [provider]  ezetimibe (ZETIA) 10 MG tablet Take 10 mg by mouth daily. 06/05/21   [provider]  Ferrous Sulfate Dried (HIGH POTENCY IRON) 65 MG TABS Take 1 tablet by mouth daily.    [provider]  gabapentin (NEURONTIN) 300 MG capsule Take 300 mg by mouth at bedtime. 02/11/19   [provider]  HYDROcodone-acetaminophen (NORCO/VICODIN) 5-325 MG tablet Take 1 tablet by mouth every 4 (four) hours as needed. 07/15/22   Gilda Crease, MD  levofloxacin (LEVAQUIN)  500 MG tablet Take 1 tablet (500 mg total) by mouth daily. 07/15/22   Gilda Crease, MD  levothyroxine (SYNTHROID) 175 MCG tablet Take 175 mcg by mouth daily before breakfast.    [provider]  metFORMIN (GLUCOPHAGE) 500 MG tablet 1 po qd at night. Patient taking differently: Take 500 mg by mouth daily with breakfast. 02/22/21   Corinna Capra A, DO  pravastatin (PRAVACHOL) 20 MG tablet Take 1 tablet (20 mg total) by mouth daily at 6 PM. 06/25/21   Tat, Onalee Hua, MD  tirzepatide [facility]) 12.5 MG/0.5ML Pen Inject 12.5 mg into the skin once a week. 08/31/22     Vibegron 75 MG TABS Take 1 tablet (75 mg total) by mouth daily. 05/31/22   Selmer Dominion, NP      Allergies    Bee venom    Review of Systems   Review of Systems  Genitourinary:  Positive for flank pain.  All other systems reviewed and are negative.   Physical Exam Updated Vital Signs BP (!) 156/76 (BP Location: Left Arm)   Pulse 66   Temp 98.1 F (36.7 C) (Oral)   Resp 18   Ht 5\' 6"  (1.676 m)   Wt 133 kg   SpO2 95%   BMI 47.33 kg/m  Physical Exam Vitals and nursing note reviewed.   62 year old female, resting comfortably and in no acute distress. Vital signs  are significant for elevated blood pressure. Oxygen saturation is 95%, which is normal. Head is normocephalic and atraumatic. PERRLA, EOMI. Oropharynx is clear. Neck is nontender and supple without adenopathy or JVD. Back is nontender in the midline.  There is mild left CVA tenderness. Lungs are clear without rales, wheezes, or rhonchi. Chest is nontender. Heart has regular rate and rhythm without murmur. Abdomen is soft, flat, nontender. Extremities have no cyanosis or edema, full range of motion is present. Skin is warm and dry without rash. Neurologic: Mental status is normal, cranial nerves are intact, moves all extremities equally.  ED Results / Procedures / Treatments   Labs (all labs ordered are listed, but only abnormal results are  displayed) Labs Reviewed  COMPREHENSIVE METABOLIC PANEL - Abnormal; Notable for the following components:      Result Value   Glucose, Bld 140 (*)    Albumin 3.4 (*)    AST 14 (*)    All other components within normal limits  CBC WITH DIFFERENTIAL/PLATELET - Abnormal; Notable for the following components:   WBC 13.2 (*)    Neutro Abs 10.9 (*)    All other components within normal limits  URINALYSIS, ROUTINE W REFLEX MICROSCOPIC - Abnormal; Notable for the following components:   APPearance CLOUDY (*)    Hgb urine dipstick LARGE (*)    Protein, ur 100 (*)    Leukocytes,Ua TRACE (*)    Bacteria, UA RARE (*)    All other components within normal limits  LIPASE, BLOOD   Radiology CT Renal Stone Study  Result Date: 09/07/2022 CLINICAL DATA:  Recurrent left-sided flank pain. EXAM: CT ABDOMEN AND PELVIS WITHOUT CONTRAST TECHNIQUE: Multidetector CT imaging of the abdomen and pelvis was performed following the standard protocol without IV contrast. RADIATION DOSE REDUCTION: This exam was performed according to the departmental dose-optimization program which includes automated exposure control, adjustment of the mA and/or kV according to patient size and/or use of iterative reconstruction technique. COMPARISON:  07/15/2022. FINDINGS: Lower chest: The heart is enlarged. Atelectasis is noted at the lung bases bilaterally. Hepatobiliary: No focal liver abnormality is seen. No gallstones, gallbladder wall thickening, or biliary dilatation. Pancreas: Unremarkable. No pancreatic ductal dilatation or surrounding inflammatory changes. Spleen: Normal in size without focal abnormality. Adrenals/Urinary Tract: The adrenal glands are within normal limits. Nonobstructive renal calculi are noted on the right. There is mild hydronephrosis on the left with a 2 mm calculus at the ureteropelvic junction. No obstructive uropathy on the right. The bladder is unremarkable. Stomach/Bowel: Small hiatal hernia is noted.  Stomach is within normal limits. Appendix appears normal. No evidence of bowel wall thickening, distention, or inflammatory changes. No free air or pneumatosis. Scattered diverticula are present along the sigmoid colon without evidence of diverticulitis. Vascular/Lymphatic: Aortic atherosclerosis. No enlarged abdominal or pelvic lymph nodes. Reproductive: Status post hysterectomy. No adnexal masses. Other: No abdominopelvic ascites. A small fat containing umbilical hernia is present. Musculoskeletal: Degenerative changes are present in the thoracolumbar spine. No acute osseous abnormality. IMPRESSION: 1. Mild obstructive uropathy on the left with a 2 mm calculus at the ureteropelvic junction. 2. Nonobstructive right renal calculi. 3. Small hiatal hernia. 4. Sigmoid diverticulosis without diverticulitis. 5. Aortic atherosclerosis. Electronically Signed   By: Thornell Sartorius M.D.   On: 09/07/2022 04:57    Procedures Procedures    Medications Ordered in ED Medications  morphine (PF) 4 MG/ML injection 4 mg (4 mg Intravenous Given 09/07/22 0434)  ondansetron (ZOFRAN) injection 4 mg (4 mg Intravenous Given 09/07/22  9604)    ED Course/ Medical Decision Making/ A&P                                 Medical Decision Making Amount and/or Complexity of Data Reviewed Labs: ordered. Radiology: ordered.  Risk Prescription drug management.   Left flank pain.  Differential diagnosis includes, but is not limited to, pyelonephritis, urolithiasis with renal colic, diverticulitis, abdominal aortic aneurysm.  These are conditions with significant risk for morbidity and complications.  I have reviewed her old records, and on 07/15/2022 she was seen in the emergency department with left flank pain and diagnosed with pyelonephritis and treated with levofloxacin.  I have ordered morphine for pain, ondansetron for nausea and I have ordered a renal stone protocol CT scan.  I have also ordered laboratory workup of CBC,  comprehensive metabolic panel, lipase, urinalysis.  I have reviewed her laboratory results, and my interpretation is urinalysis showing both hematuria and pyuria with greater than 50 RBCs, 21-50 WBCs, but a contaminated specimen with 11-20 squamous epithelial cells.  Only rare bacteria seen.  I have reviewed and interpreted her laboratory tests, and my interpretation is mild leukocytosis which is nonspecific, elevated random glucose consistent with known history of diabetes.  CT scan shows 2 mm calculus at the left ureteropelvic junction with proximal hydronephrosis.  I have independently viewed the images, and agree with radiologist's interpretation.  Patient got excellent relief of pain with above-noted treatment.  I am discharging her with prescriptions for tamsulosin, ondansetron oral dissolving tablet, hydrocodone-acetaminophen to use as needed for pain but advised to use over-the-counter acetaminophen as her main analgesic.  I have given strict return precautions should she develop fever, also return for inadequately controlled pain and nausea.  I have referred her to urology for follow-up.  Final Clinical Impression(s) / ED Diagnoses Final diagnoses:  Renal colic on left side  Ureterolithiasis    Rx / DC Orders ED Discharge Orders          Ordered    tamsulosin (FLOMAX) 0.4 MG CAPS capsule  Daily        09/07/22 0550    ondansetron (ZOFRAN-ODT) 8 MG disintegrating tablet  Every 8 hours PRN        09/07/22 0550    HYDROcodone-acetaminophen (NORCO) 5-325 MG tablet  Every 4 hours PRN        09/07/22 0550    HYDROcodone-acetaminophen (NORCO/VICODIN) 5-325 MG tablet  Every 4 hours PRN,   Status:  Discontinued        09/07/22 0550    HYDROcodone-acetaminophen (NORCO/VICODIN) 5-325 MG tablet  Every 4 hours PRN        09/07/22 0551              Dione Booze, MD 09/07/22 [phone]

## 2022-09-07 NOTE — ED Triage Notes (Signed)
Pt arrived via POV c/o recurrent left side flank pain. Pt reports calling EMS to come to [facility] for evaluation due to her receiving a narcotic last time she was seen here for same and was unable to drive home.

## 2022-09-07 NOTE — Discharge Instructions (Signed)
Drink plenty of fluids.  You may take acetaminophen as needed for less severe pain.  Return to the emergency department immediately if you develop a fever.  Also, return to the emergency department if pain is not being adequately controlled at home.

## 2022-09-07 NOTE — ED Notes (Signed)
ED Provider at bedside. 

## 2022-09-07 NOTE — ED Notes (Signed)
Pt placed on 2L [place] while sleeping d/t oxygen sats dropping to 88%.

## 2022-09-10 MED FILL — Hydrocodone-Acetaminophen Tab 5-325 MG: ORAL | Qty: 6 | Status: AC

## 2022-10-14 ENCOUNTER — Encounter: Payer: Self-pay | Admitting: Urology

## 2022-10-14 ENCOUNTER — Ambulatory Visit: Payer: 59 | Admitting: Urology

## 2022-10-14 VITALS — BP 149/91 | HR 93

## 2022-10-14 DIAGNOSIS — R3129 Other microscopic hematuria: Secondary | ICD-10-CM

## 2022-10-14 DIAGNOSIS — N281 Cyst of kidney, acquired: Secondary | ICD-10-CM

## 2022-10-14 DIAGNOSIS — N200 Calculus of kidney: Secondary | ICD-10-CM

## 2022-10-14 DIAGNOSIS — N201 Calculus of ureter: Secondary | ICD-10-CM | POA: Diagnosis not present

## 2022-10-14 DIAGNOSIS — N132 Hydronephrosis with renal and ureteral calculous obstruction: Secondary | ICD-10-CM

## 2022-10-14 LAB — MICROSCOPIC EXAMINATION

## 2022-10-14 LAB — URINALYSIS, ROUTINE W REFLEX MICROSCOPIC
Bilirubin, UA: NEGATIVE
Glucose, UA: NEGATIVE
Ketones, UA: NEGATIVE
Leukocytes,UA: NEGATIVE
Nitrite, UA: NEGATIVE
Protein,UA: NEGATIVE
Specific Gravity, UA: 1.02 (ref 1.005–1.030)
Urobilinogen, Ur: 1 mg/dL (ref 0.2–1.0)
pH, UA: 7 (ref 5.0–7.5)

## 2022-10-14 NOTE — Progress Notes (Unsigned)
10/14/2022 10:09 AM   Bjorn Pippin May 22, 1960 [id number]  Referring provider: Assunta Found, MD [address] [place],  [place] [address]  No chief complaint on file.   HPI:  New patient-  1) left ureteral stone-patient had left flank pain underwent CT scan of the abdomen and pelvis July 15, 2022.  This showed a slight delayed nephrogram on the left but no hydronephrosis.  Some mild fat stranding around the left renal pelvis.  Left parapelvic cysts were noted.  UA was clear with no microscopic hematuria.  She had recurrent left sided pain and underwent a second CT scan September 07, 2022 which revealed a 1 to 2 mm left UPJ or proximal ureteral stone with mild left hydronephrosis.  Her UA now showed greater than 50 red blood cells.  White count was 13, creatinine 0.8.  Today, she is well. No further flank pain. She did not see a stone pass.   UA shows 3-10 red blood cells.  Few bacteria. She is a smoker. No gross hematuria.   She saw Uro Gyn in [place] for a "sling". She started [place]. Gemtesa helped.    PMH: Past Medical History:  Diagnosis Date   Arthritis    shoulders, knees, hips   Back pain    Bilateral swelling of feet and ankles    Carotid artery occlusion    Dental crowns present    GERD (gastroesophageal reflux disease)    Hypertension    Hypothyroidism    Knee pain    right knee   Left knee pain 07/2014   Osteoarthritis    Other fatigue    Pneumonia    hx of 02/2017    Right knee pain    Shortness of breath on exertion    Stress incontinence    Stroke [facility])    speech difficulty recalling words   Wears partial dentures    upper and lower    Surgical History: Past Surgical History:  Procedure Laterality Date   ABDOMINAL HYSTERECTOMY     partial   CAROTID ENDARTERECTOMY     CESAREAN SECTION  1995, 1998   ENDARTERECTOMY Right 11/05/2017   Procedure: ENDARTERECTOMY CAROTID RIGHT;  Surgeon: Nada Libman, MD;  Location: [facility];  Service:  Vascular;  Laterality: Right;   ESOPHAGOGASTRODUODENOSCOPY N/A 11/26/2012   Procedure: ESOPHAGOGASTRODUODENOSCOPY (EGD);  Surgeon: Kandis Cocking, MD;  Location: [facility];  Service: General;  Laterality: N/A;   GANGLION CYST EXCISION Right 02/2008   foot   KNEE ARTHROSCOPY WITH LATERAL MENISECTOMY Left 07/21/2014   Procedure: LEFT KNEE ARTHROSCOPY,  CHONDROPLASTY, WITH LATERAL AND MEDIAL  MENISCECTOMIES;  Surgeon: Mckinley Jewel, MD;  Location: [facility];  Service: Orthopedics;  Laterality: Left;   KNEE ARTHROSCOPY WITH MEDIAL MENISECTOMY Left 07/21/2014   Procedure: KNEE ARTHROSCOPY WITH MEDIAL MENISECTOMY;  Surgeon: Mckinley Jewel, MD;  Location: [facility];  Service: Orthopedics;  Laterality: Left;   LAPAROSCOPIC GASTRIC BANDING  02/18/2008   LAPAROSCOPIC GASTRIC BANDING N/A 2010   LAPAROSCOPIC REPAIR AND REMOVAL OF GASTRIC BAND N/A 03/21/2017   Procedure: LAPAROSCOPIC REPAIR AND REMOVAL OF GASTRIC BAND;  Surgeon: Gaynelle Adu, MD;  Location: [facility];  Service: General;  Laterality: N/A;   PARTIAL HYSTERECTOMY  09/07/2002   PATCH ANGIOPLASTY Right 11/05/2017   Procedure: PATCH ANGIOPLASTY of right carotid artery using xenosure bovine pericardium patch;  Surgeon: Nada Libman, MD;  Location: [facility];  Service: Vascular;  Laterality: Right;   SHOULDER ARTHROSCOPY WITH SUBACROMIAL DECOMPRESSION,  ROTATOR CUFF REPAIR AND BICEP TENDON REPAIR Right 11/23/2015   Procedure: RIGHT SHOULDER ARTHROSCOPY WITH DEBRIDEMENT, SUBACROMIAL DECOMPRESSION, DISTAL CLAVICLE EXCISION, ROTATOR CUFF REPAIR AND BICEP TENODESIS;  Surgeon: Loreta Ave, MD;  Location: [facility];  Service: Orthopedics;  Laterality: Right;    Home Medications:  Allergies as of 10/14/2022       Reactions   Bee Venom Shortness Of Breath, Swelling        Medication List        Accurate as of October 14, 2022 10:09 AM. If you have any questions, ask your nurse or doctor.           celecoxib 200 MG capsule Commonly known as: CELEBREX Take 200 mg by mouth 2 (two) times daily.   citalopram 10 MG tablet Commonly known as: CELEXA Take 10 mg by mouth daily.   clopidogrel 75 MG tablet Commonly known as: PLAVIX Take 1 tablet (75 mg total) by mouth daily.   diltiazem 180 MG 24 hr capsule Commonly known as: CARDIZEM CD Take 180 mg by mouth at bedtime.   docusate sodium 100 MG capsule Commonly known as: COLACE Take 100 mg by mouth in the morning.   ezetimibe 10 MG tablet Commonly known as: ZETIA Take 10 mg by mouth daily.   gabapentin 300 MG capsule Commonly known as: NEURONTIN Take 300 mg by mouth at bedtime.   High Potency Iron 65 MG Tabs Take 1 tablet by mouth daily.   HYDROcodone-acetaminophen 5-325 MG tablet Commonly known as: Norco Take 1 tablet by mouth every 4 (four) hours as needed for moderate pain.   HYDROcodone-acetaminophen 5-325 MG tablet Commonly known as: NORCO/VICODIN Take 1 tablet by mouth every 4 (four) hours as needed.   metFORMIN 500 MG tablet Commonly known as: GLUCOPHAGE 1 po qd at night. What changed:  how much to take how to take this when to take this additional instructions   Mounjaro 12.5 MG/0.5ML Pen Generic drug: tirzepatide Inject 12.5 mg into the skin once a week.   omeprazole 40 MG capsule Commonly known as: PRILOSEC Take 40 mg by mouth daily.   ondansetron 8 MG disintegrating tablet Commonly known as: ZOFRAN-ODT Take 1 tablet (8 mg total) by mouth every 8 (eight) hours as needed for nausea or vomiting.   pravastatin 20 MG tablet Commonly known as: PRAVACHOL Take 1 tablet (20 mg total) by mouth daily at 6 PM.   rosuvastatin 10 MG tablet Commonly known as: CRESTOR Take 10 mg by mouth daily.   Synthroid 175 MCG tablet Generic drug: levothyroxine Take 175 mcg by mouth daily before breakfast.   tamsulosin 0.4 MG Caps capsule Commonly known as: FLOMAX Take 1 capsule (0.4 mg total) by mouth daily.    Vibegron 75 MG Tabs Take 1 tablet (75 mg total) by mouth daily.   Vitamin D 125 MCG (5000 UT) Caps Take 125 mcg by mouth daily.        Allergies:  Allergies  Allergen Reactions   Bee Venom Shortness Of Breath and Swelling    Family History: Family History  Problem Relation Age of Onset   CVA Mother    Hypertension Father    CVA Other    Diabetes Other    CAD Other    Cancer Neg Hx     Social History:  reports that she quit smoking about 4 years ago. Her smoking use included cigarettes. She started smoking about 50 years ago. She has a 46 pack-year smoking history. She  has never used smokeless tobacco. She reports current alcohol use. She reports that she does not use drugs.   Physical Exam: BP (!) 149/91   Pulse 93   Constitutional:  Alert and oriented, No acute distress. HEENT: [place] AT, moist mucus membranes.  Trachea midline, no masses. Cardiovascular: No clubbing, cyanosis, or edema. Respiratory: Normal respiratory effort, no increased work of breathing. GI: Abdomen is soft, nontender, nondistended, no abdominal masses GU: No CVA tenderness Skin: No rashes, bruises or suspicious lesions. Neurologic: Grossly intact, no focal deficits, moving all 4 extremities. Psychiatric: Normal mood and affect.  Laboratory Data: Lab Results  Component Value Date   WBC 13.2 (H) 09/07/2022   HGB 14.3 09/07/2022   HCT 44.1 09/07/2022   MCV 92.3 09/07/2022   PLT 265 09/07/2022    Lab Results  Component Value Date   CREATININE 0.81 09/07/2022    No results found for: "PSA"  No results found for: "TESTOSTERONE"  Lab Results  Component Value Date   HGBA1C 6.1 (H) 06/25/2021    Urinalysis    Component Value Date/Time   COLORURINE YELLOW 09/07/2022 0325   APPEARANCEUR CLOUDY (A) 09/07/2022 0325   LABSPEC 1.024 09/07/2022 0325   PHURINE 5.0 09/07/2022 0325   GLUCOSEU NEGATIVE 09/07/2022 0325   HGBUR LARGE (A) 09/07/2022 0325   BILIRUBINUR NEGATIVE 09/07/2022 0325    BILIRUBINUR negative 05/31/2022 1024   KETONESUR NEGATIVE 09/07/2022 0325   PROTEINUR 100 (A) 09/07/2022 0325   UROBILINOGEN 0.2 05/31/2022 1024   UROBILINOGEN 0.2 07/07/2013 1432   NITRITE NEGATIVE 09/07/2022 0325   LEUKOCYTESUR TRACE (A) 09/07/2022 0325    Lab Results  Component Value Date   BACTERIA RARE (A) 09/07/2022    Pertinent Imaging: CT images x 2   No valid procedures specified. No results found for this or any previous visit.  Results for orders placed during the hospital encounter of 09/07/22  CT Renal Stone Study  Narrative CLINICAL DATA:  Recurrent left-sided flank pain.  EXAM: CT ABDOMEN AND PELVIS WITHOUT CONTRAST  TECHNIQUE: Multidetector CT imaging of the abdomen and pelvis was performed following the standard protocol without IV contrast.  RADIATION DOSE REDUCTION: This exam was performed according to the departmental dose-optimization program which includes automated exposure control, adjustment of the mA and/or kV according to patient size and/or use of iterative reconstruction technique.  COMPARISON:  07/15/2022.  FINDINGS: Lower chest: The heart is enlarged. Atelectasis is noted at the lung bases bilaterally.  Hepatobiliary: No focal liver abnormality is seen. No gallstones, gallbladder wall thickening, or biliary dilatation.  Pancreas: Unremarkable. No pancreatic ductal dilatation or surrounding inflammatory changes.  Spleen: Normal in size without focal abnormality.  Adrenals/Urinary Tract: The adrenal glands are within normal limits. Nonobstructive renal calculi are noted on the right. There is mild hydronephrosis on the left with a 2 mm calculus at the ureteropelvic junction. No obstructive uropathy on the right. The bladder is unremarkable.  Stomach/Bowel: Small hiatal hernia is noted. Stomach is within normal limits. Appendix appears normal. No evidence of bowel wall thickening, distention, or inflammatory changes. No free  air or pneumatosis. Scattered diverticula are present along the sigmoid colon without evidence of diverticulitis.  Vascular/Lymphatic: Aortic atherosclerosis. No enlarged abdominal or pelvic lymph nodes.  Reproductive: Status post hysterectomy. No adnexal masses.  Other: No abdominopelvic ascites. A small fat containing umbilical hernia is present.  Musculoskeletal: Degenerative changes are present in the thoracolumbar spine. No acute osseous abnormality.  IMPRESSION: 1. Mild obstructive uropathy on the left with a  2 mm calculus at the ureteropelvic junction. 2. Nonobstructive right renal calculi. 3. Small hiatal hernia. 4. Sigmoid diverticulosis without diverticulitis. 5. Aortic atherosclerosis.   Electronically Signed By: Thornell Sartorius M.D. On: 09/07/2022 04:57   Assessment & Plan:    1. Kidney stones Small renal stones.  Discussed dietary changes to prevent stones. - Urinalysis, Routine w reflex microscopic  2.  Left ureteral stone, hydronephrosis - likely passed. Check renal US.   3. Microhematuria - smoking with LUTS. Clear bladder with cystoscopy.   4. left parapelvic cysts - discussed benign nature   No follow-ups on file.  Jerilee Field, MD  [facility]  [address] [place], [place] [address] [phone]

## 2022-10-18 ENCOUNTER — Ambulatory Visit (HOSPITAL_COMMUNITY)
Admission: RE | Admit: 2022-10-18 | Discharge: 2022-10-18 | Disposition: A | Discharge status: Home / Self Care | Payer: 59 | Source: Ambulatory Visit | Attending: Urology | Admitting: Urology

## 2022-10-18 DIAGNOSIS — N132 Hydronephrosis with renal and ureteral calculous obstruction: Secondary | ICD-10-CM | POA: Diagnosis present

## 2022-10-18 DIAGNOSIS — N281 Cyst of kidney, acquired: Secondary | ICD-10-CM | POA: Insufficient documentation

## 2022-10-18 DIAGNOSIS — N200 Calculus of kidney: Secondary | ICD-10-CM | POA: Insufficient documentation

## 2022-10-18 DIAGNOSIS — N201 Calculus of ureter: Secondary | ICD-10-CM | POA: Diagnosis present

## 2022-10-18 DIAGNOSIS — R3129 Other microscopic hematuria: Secondary | ICD-10-CM | POA: Insufficient documentation

## 2022-10-22 ENCOUNTER — Other Ambulatory Visit (HOSPITAL_BASED_OUTPATIENT_CLINIC_OR_DEPARTMENT_OTHER): Payer: Self-pay

## 2022-10-22 MED ORDER — MOUNJARO 15 MG/0.5ML ~~LOC~~ SOAJ
15.0000 mg | SUBCUTANEOUS | 0 refills | Status: DC
  Filled 2022-10-22: qty 2, 28d supply, fill #0

## 2022-10-28 ENCOUNTER — Ambulatory Visit (INDEPENDENT_AMBULATORY_CARE_PROVIDER_SITE_OTHER): Payer: 59 | Admitting: Urology

## 2022-10-28 ENCOUNTER — Encounter: Payer: Self-pay | Admitting: Urology

## 2022-10-28 VITALS — BP 132/77 | HR 91

## 2022-10-28 DIAGNOSIS — R3129 Other microscopic hematuria: Secondary | ICD-10-CM

## 2022-10-28 DIAGNOSIS — N1330 Unspecified hydronephrosis: Secondary | ICD-10-CM

## 2022-10-28 DIAGNOSIS — N3946 Mixed incontinence: Secondary | ICD-10-CM

## 2022-10-28 DIAGNOSIS — N132 Hydronephrosis with renal and ureteral calculous obstruction: Secondary | ICD-10-CM

## 2022-10-28 DIAGNOSIS — N201 Calculus of ureter: Secondary | ICD-10-CM

## 2022-10-28 LAB — POCT URINALYSIS DIPSTICK
Bilirubin, UA: NEGATIVE
Glucose, UA: NEGATIVE
Ketones, UA: NEGATIVE
Leukocytes, UA: NEGATIVE
Nitrite, UA: NEGATIVE
Protein, UA: POSITIVE — AB
Spec Grav, UA: >=1.03 — AB (ref 1.010–1.025)
Urobilinogen, UA: NEGATIVE U/dL — AB
pH, UA: 7 (ref 5.0–8.0)

## 2022-10-28 MED ORDER — CIPROFLOXACIN HCL 500 MG PO TABS
500.0000 mg | ORAL_TABLET | Freq: Once | ORAL | Status: AC
Start: 2022-10-28 — End: 2022-10-28
  Administered 2022-10-28: 500 mg via ORAL

## 2022-10-28 NOTE — Progress Notes (Unsigned)
  [place]  10/28/22  CC: No chief complaint on file.   HPI:  1) left ureteral stone-patient had left flank pain underwent CT scan of the abdomen and pelvis July 15, 2022.  This showed a slight delayed nephrogram on the left but no hydronephrosis.  Some mild fat stranding around the left renal pelvis.  Left parapelvic cysts were noted.  UA was clear with no microscopic hematuria.   She had recurrent left sided pain and underwent a second CT scan September 07, 2022 which revealed a 1 to 2 mm left UPJ or proximal ureteral stone with mild left hydronephrosis.  Her UA now showed greater than 50 red blood cells.  White count was 13, creatinine 0.8.  2) MH - UA with > 50 rbc during a stone passage even but persistent 3-10 rbc after stone passage. She is a smoker. No gross hematuria.   Today, seen for the above. F/u 10/18/2022 revealed no stone, mass or hydronephrosis. She has had no further flank pain. No gross hematuria.    She saw Uro Gyn in [place] for a "sling". She started [place]. Gemtesa helped.   Blood pressure 132/77, pulse 91. NED. A&Ox3.   No respiratory distress   Abd soft, NT, ND Normal external genitalia with patent urethral meatus No prolapse. Good support.  Normal introitus.  UHM difficult to assess. Meatus retracted.    Chaperone for exam and cystoscopy - [place]  Cystoscopy Procedure Note  Patient identification was confirmed, informed consent was obtained, and patient was prepped using Betadine solution.  Lidocaine jelly was administered per urethral meatus.    Procedure: - Flexible cystoscope introduced, without any difficulty.   - Thorough search of the bladder revealed:    normal urethral meatus - filled to about 250 ml. No SUI on cough.     normal urothelium    no stones    no ulcers     no tumors    no urethral polyps    no trabeculation  - Ureteral orifices were normal in position and appearance.  Post-Procedure: - Patient tolerated the procedure  well  Assessment/ Plan:  MH - benign evaluation   Incontinence - improved on  Gemtesa. She asked if she needed a "sling" and I discussed based on my exam and cystoscopy, it seems the Leslye Peer has fixed her issue. That being said, other data documentation might support a sling.   Hydro/left ureteral stone -resolved.     No follow-ups on file.  Jerilee Field, MD

## 2022-11-29 ENCOUNTER — Other Ambulatory Visit (HOSPITAL_BASED_OUTPATIENT_CLINIC_OR_DEPARTMENT_OTHER): Payer: Self-pay

## 2022-12-02 ENCOUNTER — Other Ambulatory Visit (HOSPITAL_BASED_OUTPATIENT_CLINIC_OR_DEPARTMENT_OTHER): Payer: Self-pay

## 2022-12-02 ENCOUNTER — Other Ambulatory Visit: Payer: Self-pay

## 2022-12-02 MED ORDER — MOUNJARO 15 MG/0.5ML ~~LOC~~ SOAJ
15.0000 mg | SUBCUTANEOUS | 2 refills | Status: AC
  Filled 2022-12-02: qty 2, 28d supply, fill #0

## 2023-01-04 ENCOUNTER — Emergency Department (HOSPITAL_COMMUNITY): Payer: 59

## 2023-01-04 ENCOUNTER — Other Ambulatory Visit: Payer: Self-pay

## 2023-01-04 ENCOUNTER — Encounter (HOSPITAL_COMMUNITY): Payer: Self-pay

## 2023-01-04 ENCOUNTER — Emergency Department (HOSPITAL_COMMUNITY)
Admission: EM | Admit: 2023-01-04 | Discharge: 2023-01-04 | Disposition: A | Discharge status: Home / Self Care | Payer: 59 | Attending: Emergency Medicine | Admitting: Emergency Medicine

## 2023-01-04 DIAGNOSIS — Z7902 Long term (current) use of antithrombotics/antiplatelets: Secondary | ICD-10-CM | POA: Insufficient documentation

## 2023-01-04 DIAGNOSIS — Z720 Tobacco use: Secondary | ICD-10-CM | POA: Diagnosis not present

## 2023-01-04 DIAGNOSIS — Z79899 Other long term (current) drug therapy: Secondary | ICD-10-CM | POA: Insufficient documentation

## 2023-01-04 DIAGNOSIS — R079 Chest pain, unspecified: Secondary | ICD-10-CM | POA: Diagnosis present

## 2023-01-04 DIAGNOSIS — E119 Type 2 diabetes mellitus without complications: Secondary | ICD-10-CM | POA: Diagnosis not present

## 2023-01-04 DIAGNOSIS — I10 Essential (primary) hypertension: Secondary | ICD-10-CM | POA: Diagnosis not present

## 2023-01-04 DIAGNOSIS — R072 Precordial pain: Secondary | ICD-10-CM | POA: Insufficient documentation

## 2023-01-04 DIAGNOSIS — R0789 Other chest pain: Secondary | ICD-10-CM

## 2023-01-04 DIAGNOSIS — Z7984 Long term (current) use of oral hypoglycemic drugs: Secondary | ICD-10-CM | POA: Insufficient documentation

## 2023-01-04 LAB — CBC WITH DIFFERENTIAL/PLATELET
Abs Immature Granulocytes: 0.05 10*3/uL (ref 0.00–0.07)
Basophils Absolute: 0.1 10*3/uL (ref 0.0–0.1)
Basophils Relative: 1 %
Eosinophils Absolute: 0.2 10*3/uL (ref 0.0–0.5)
Eosinophils Relative: 2 %
HCT: 42 % (ref 36.0–46.0)
Hemoglobin: 13.6 g/dL (ref 12.0–15.0)
Immature Granulocytes: 1 %
Lymphocytes Relative: 22 %
Lymphs Abs: 1.8 10*3/uL (ref 0.7–4.0)
MCH: 29.4 pg (ref 26.0–34.0)
MCHC: 32.4 g/dL (ref 30.0–36.0)
MCV: 90.7 fL (ref 80.0–100.0)
Monocytes Absolute: 0.7 10*3/uL (ref 0.1–1.0)
Monocytes Relative: 8 %
Neutro Abs: 5.6 10*3/uL (ref 1.7–7.7)
Neutrophils Relative %: 66 %
Platelets: 279 10*3/uL (ref 150–400)
RBC: 4.63 MIL/uL (ref 3.87–5.11)
RDW: 14 % (ref 11.5–15.5)
WBC: 8.3 10*3/uL (ref 4.0–10.5)
nRBC: 0 % (ref 0.0–0.2)

## 2023-01-04 LAB — COMPREHENSIVE METABOLIC PANEL
ALT: 23 U/L (ref 0–44)
AST: 13 U/L — ABNORMAL LOW (ref 15–41)
Albumin: 3 g/dL — ABNORMAL LOW (ref 3.5–5.0)
Alkaline Phosphatase: 61 U/L (ref 38–126)
Anion gap: 8 (ref 5–15)
BUN: 16 mg/dL (ref 8–23)
CO2: 29 mmol/L (ref 22–32)
Calcium: 9.1 mg/dL (ref 8.9–10.3)
Chloride: 102 mmol/L (ref 98–111)
Creatinine, Ser: 0.73 mg/dL (ref 0.44–1.00)
GFR, Estimated: >60 mL/min (ref >60)
Glucose, Bld: 147 mg/dL — ABNORMAL HIGH (ref 70–99)
Potassium: 3.5 mmol/L (ref 3.5–5.1)
Sodium: 139 mmol/L (ref 135–145)
Total Bilirubin: 0.4 mg/dL (ref <1.2)
Total Protein: 6.5 g/dL (ref 6.5–8.1)

## 2023-01-04 LAB — TROPONIN I (HIGH SENSITIVITY)
Troponin I (High Sensitivity): 5 ng/L (ref <18)
Troponin I (High Sensitivity): 5 ng/L (ref <18)

## 2023-01-04 LAB — APTT: aPTT: 26 s (ref 24–36)

## 2023-01-04 LAB — PROTIME-INR
INR: 1 (ref 0.8–1.2)
Prothrombin Time: 13.2 s (ref 11.4–15.2)

## 2023-01-04 LAB — LIPID PANEL
Cholesterol: 95 mg/dL (ref 0–200)
HDL: 36 mg/dL — ABNORMAL LOW (ref >40)
LDL Cholesterol: 38 mg/dL (ref 0–99)
Total CHOL/HDL Ratio: 2.6 {ratio}
Triglycerides: 107 mg/dL (ref <150)
VLDL: 21 mg/dL (ref 0–40)

## 2023-01-04 MED ORDER — ASPIRIN 81 MG PO CHEW
324.0000 mg | CHEWABLE_TABLET | Freq: Once | ORAL | Status: AC
  Administered 2023-01-04: 324 mg via ORAL
  Filled 2023-01-04: qty 4

## 2023-01-04 MED ORDER — NITROGLYCERIN 0.4 MG SL SUBL
0.4000 mg | SUBLINGUAL_TABLET | SUBLINGUAL | Status: DC | PRN
  Administered 2023-01-04: 0.4 mg via SUBLINGUAL
  Filled 2023-01-04: qty 1

## 2023-01-04 NOTE — ED Triage Notes (Signed)
Pt c/o central chest pain starting this morning around 0900. Pt states attempted taking acid reflux medication to see if it would help but no change. Pt states "feels like something is sitting on my chest."

## 2023-01-04 NOTE — Discharge Instructions (Signed)
I would like you to follow-up with your cardiologist within the next week, your testing today has not shown any signs of heart attack but we know from your history that you probably do have some blockages in your arteries.  This means that you will likely need further evaluation with testing such as a stress test or other testing by the cardiologist office.  Please call them in the morning to let them know that you need a follow-up appointment, in the meantime take a baby aspirin daily and return to the ER immediately if you develop severe or worsening symptoms.

## 2023-01-04 NOTE — ED Provider Notes (Signed)
[place] EMERGENCY DEPARTMENT AT [facility] Provider Note   CSN: [id number] Arrival date & time: 01/04/23  1033     History  Chief Complaint  Patient presents with   Chest Pain    Angela Abbott is a 62 y.o. female.  HPI   This patient is a 62 year old female history of hypertension diabetes high cholesterol tobacco use and a prior stroke currently on Plavix.  She presents to the hospital stating that she had acute onset of chest discomfort this morning around 9:00, that occurred she was waking up with an extreme heaviness on her chest which she has never had before.  She has had acid reflux and stated it felt somewhat similar but it was definitely different.  It does not seem to be any worse with exertion, she is not associating shortness of breath, she is nauseated but not diaphoretic, she has no swelling of her legs, no history of blood clot, she has no history of any cardiac testing and states she has never had any blockages in her heart that she knows of.  She does not do anything to exert herself at home, no exercise, she did have an echocardiogram performed in June 2023 as well as a carotid ultrasound and had a resultant right carotid endarterectomy  Echocardiogram from June showed that the patient had an ejection fraction of 60 to 65% with grade 1 diastolic dysfunction  Home Medications Prior to Admission medications   Medication Sig Start Date End Date Taking? Authorizing Provider  celecoxib (CELEBREX) 200 MG capsule Take 200 mg by mouth 2 (two) times daily.    [provider]  Cholecalciferol (VITAMIN D) 125 MCG (5000 UT) CAPS Take 125 mcg by mouth daily.    [provider]  citalopram (CELEXA) 10 MG tablet Take 10 mg by mouth daily. 02/11/19   [provider]  clopidogrel (PLAVIX) 75 MG tablet Take 1 tablet (75 mg total) by mouth daily. 06/26/21   Catarina Hartshorn, MD  diltiazem (CARDIZEM CD) 180 MG 24 hr capsule Take 180 mg by mouth at  bedtime.  10/31/17   [provider]  docusate sodium (COLACE) 100 MG capsule Take 100 mg by mouth in the morning.    [provider]  ezetimibe (ZETIA) 10 MG tablet Take 10 mg by mouth daily. 06/05/21   [provider]  Ferrous Sulfate Dried (HIGH POTENCY IRON) 65 MG TABS Take 1 tablet by mouth daily.    [provider]  gabapentin (NEURONTIN) 300 MG capsule Take 300 mg by mouth at bedtime. 02/11/19   [provider]  HYDROcodone-acetaminophen (NORCO) 5-325 MG tablet Take 1 tablet by mouth every 4 (four) hours as needed for moderate pain. Patient not taking: Reported on 10/14/2022 09/07/22   Dione Booze, MD  HYDROcodone-acetaminophen (NORCO/VICODIN) 5-325 MG tablet Take 1 tablet by mouth every 4 (four) hours as needed. Patient not taking: Reported on 10/14/2022 09/07/22   Dione Booze, MD  levothyroxine (SYNTHROID) 175 MCG tablet Take 175 mcg by mouth daily before breakfast.    [provider]  metFORMIN (GLUCOPHAGE) 500 MG tablet 1 po qd at night. Patient taking differently: Take 500 mg by mouth daily with breakfast. 02/22/21   Corinna Capra A, DO  omeprazole (PRILOSEC) 40 MG capsule Take 40 mg by mouth daily.    [provider]  ondansetron (ZOFRAN-ODT) 8 MG disintegrating tablet Take 1 tablet (8 mg total) by mouth every 8 (eight) hours as needed for nausea or vomiting. Patient  not taking: Reported on 10/28/2022 09/07/22   Dione Booze, MD  pravastatin (PRAVACHOL) 20 MG tablet Take 1 tablet (20 mg total) by mouth daily at 6 PM. 06/25/21   Tat, Onalee Hua, MD  rosuvastatin (CRESTOR) 10 MG tablet Take 10 mg by mouth daily. 10/07/22   [provider]  tamsulosin (FLOMAX) 0.4 MG CAPS capsule Take 1 capsule (0.4 mg total) by mouth daily. 09/07/22   Dione Booze, MD  tirzepatide [facility]) 12.5 MG/0.5ML Pen Inject 12.5 mg into the skin once a week. Patient not taking: Reported on 10/28/2022 08/31/22     tirzepatide [facility]) 15 MG/0.5ML Pen  Inject 15 mg into the skin once a week. 12/02/22     Vibegron 75 MG TABS Take 1 tablet (75 mg total) by mouth daily. 05/31/22   Selmer Dominion, NP      Allergies    Bee venom    Review of Systems   Review of Systems  All other systems reviewed and are negative.   Physical Exam Updated Vital Signs BP 125/62   Pulse 68   Temp (!) 97.3 F (36.3 C) (Oral)   Resp 17   Ht 1.676 m (5\' 6" )   Wt 133 kg   SpO2 90%   BMI 47.33 kg/m  Physical Exam Vitals and nursing note reviewed.  Constitutional:      General: She is not in acute distress.    Appearance: She is well-developed.  HENT:     Head: Normocephalic and atraumatic.     Mouth/Throat:     Pharynx: No oropharyngeal exudate.  Eyes:     General: No scleral icterus.       Right eye: No discharge.        Left eye: No discharge.     Conjunctiva/sclera: Conjunctivae normal.     Pupils: Pupils are equal, round, and reactive to light.  Neck:     Thyroid: No thyromegaly.     Vascular: No JVD.  Cardiovascular:     Rate and Rhythm: Normal rate and regular rhythm.     Heart sounds: Normal heart sounds. No murmur heard.    No friction rub. No gallop.  Pulmonary:     Effort: Pulmonary effort is normal. No respiratory distress.     Breath sounds: Normal breath sounds. No wheezing or rales.  Abdominal:     General: Bowel sounds are normal. There is no distension.     Palpations: Abdomen is soft. There is no mass.     Tenderness: There is no abdominal tenderness.  Musculoskeletal:        General: No tenderness. Normal range of motion.     Cervical back: Normal range of motion and neck supple.  Lymphadenopathy:     Cervical: No cervical adenopathy.  Skin:    General: Skin is warm and dry.     Findings: No erythema or rash.  Neurological:     Mental Status: She is alert.     Coordination: Coordination normal.  Psychiatric:        Behavior: Behavior normal.     ED Results / Procedures / Treatments   Labs (all labs  ordered are listed, but only abnormal results are displayed) Labs Reviewed  COMPREHENSIVE METABOLIC PANEL - Abnormal; Notable for the following components:      Result Value   Glucose, Bld 147 (*)    Albumin 3.0 (*)    AST 13 (*)    All other components within normal limits  LIPID PANEL -  Abnormal; Notable for the following components:   HDL 36 (*)    All other components within normal limits  CBC WITH DIFFERENTIAL/PLATELET  PROTIME-INR  APTT  HEMOGLOBIN A1C  TROPONIN I (HIGH SENSITIVITY)  TROPONIN I (HIGH SENSITIVITY)    EKG EKG Interpretation Date/Time:  Saturday January 04 2023 10:52:03 EST Ventricular Rate:  80 PR Interval:  196 QRS Duration:  99 QT Interval:  363 QTC Calculation: 419 R Axis:   -55  Text Interpretation: Sinus rhythm Left anterior fascicular block Low voltage, precordial leads Consider anterior infarct since 6/23, no significant chagnes seen Confirmed by Eber Hong ([id number]) on 01/04/2023 11:09:24 AM  Radiology DG Chest Port 1 View Result Date: 01/04/2023 CLINICAL DATA:  62 year old female with history of chest pain. EXAM: PORTABLE CHEST 1 VIEW COMPARISON:  Chest x-ray 10/27/2017. FINDINGS: Lung volumes are low. No consolidative airspace disease. No pleural effusions. No pneumothorax. No pulmonary nodule or mass noted. Pulmonary vasculature and the cardiomediastinal silhouette are within normal limits. Atherosclerotic calcifications are noted in the thoracic aorta. IMPRESSION: 1. Low lung volumes without radiographic evidence of acute cardiopulmonary disease. 2. Aortic atherosclerosis. Electronically Signed   By: Trudie Reed M.D.   On: 01/04/2023 13:00    Procedures Procedures    Medications Ordered in ED Medications  nitroGLYCERIN (NITROSTAT) SL tablet 0.4 mg (0.4 mg Sublingual Given 01/04/23 1121)  aspirin chewable tablet 324 mg (324 mg Oral Given 01/04/23 1120)    ED Course/ Medical Decision Making/ A&P                                  Medical Decision Making Amount and/or Complexity of Data Reviewed Labs: ordered. Radiology: ordered.  Risk OTC drugs. Prescription drug management.    This patient presents to the ED for concern of chest discomfort, this involves an extensive number of treatment options, and is a complaint that carries with it a high risk of complications and morbidity.  The differential diagnosis includes acid reflux, coronary disease, acute MI, seems less likely to be PE or aortic dissection   Co morbidities that complicate the patient evaluation  Obesity, tobacco use, hypertension diabetes high cholesterol and known cerebrovascular disease   Additional history obtained:  Additional history obtained from medical record External records from outside source obtained and reviewed including see HPI for results of echocardiogram and prior evaluation Follows with Dr. Cristal Deer with cardiology,   Lab Tests:  I Ordered, and personally interpreted labs.  The pertinent results include: Negative troponin x 2, CBC unremarkable, metabolic panel reassuring   Imaging Studies ordered:  I ordered imaging studies including chest x-ray I independently visualized and interpreted imaging which showed no acute findings I agree with the radiologist interpretation   Cardiac Monitoring: / EKG:  The patient was maintained on a cardiac monitor.  I personally viewed and interpreted the cardiac monitored which showed an underlying rhythm of: Normal sinus rhythm    Problem List / ED Course / Critical interventions / Medication management  The patient's chest pain has completely resolved while in the emergency department, the troponin was measured and a second troponin was used to compare, there was no delta change I ordered medication including nitroglycerin for pain Reevaluation of the patient after these medicines showed that the patient resolved immediately and never returned I have reviewed the patients  home medicines and have made adjustments as needed   Social Determinants of Health:  Known cardiovascular disease  and cerebrovascular disease   Test / Admission - Considered:  Considered admission but the patient is symptom-free with 2 negative troponins and wants to go home, she is agreeable to follow-up with her cardiologist as an outpatient and understands indications for return   I have discussed with the patient at the bedside the results, and the meaning of these results.  They have had opportunity to ask questions,  expressed their understanding to the need for follow-up with primary care physician         Final Clinical Impression(s) / ED Diagnoses Final diagnoses:  Chest pain, mid sternal    Rx / DC Orders ED Discharge Orders     None         Eber Hong, MD 01/04/23 1440

## 2023-01-06 LAB — HEMOGLOBIN A1C
Hgb A1c MFr Bld: 6.4 % — ABNORMAL HIGH (ref 4.8–5.6)
Mean Plasma Glucose: 137 mg/dL

## 2023-01-10 ENCOUNTER — Other Ambulatory Visit (HOSPITAL_BASED_OUTPATIENT_CLINIC_OR_DEPARTMENT_OTHER): Payer: Self-pay

## 2023-01-10 MED ORDER — MOUNJARO 15 MG/0.5ML ~~LOC~~ SOAJ
15.0000 mg | SUBCUTANEOUS | 1 refills | Status: AC
  Filled 2023-01-10: qty 2, 28d supply, fill #0

## 2023-01-16 ENCOUNTER — Encounter (HOSPITAL_BASED_OUTPATIENT_CLINIC_OR_DEPARTMENT_OTHER): Payer: Self-pay | Admitting: Family

## 2023-01-17 ENCOUNTER — Ambulatory Visit (HOSPITAL_BASED_OUTPATIENT_CLINIC_OR_DEPARTMENT_OTHER): Payer: 59 | Admitting: Family

## 2023-01-17 ENCOUNTER — Encounter (HOSPITAL_BASED_OUTPATIENT_CLINIC_OR_DEPARTMENT_OTHER): Payer: Self-pay

## 2023-01-24 ENCOUNTER — Encounter (HOSPITAL_BASED_OUTPATIENT_CLINIC_OR_DEPARTMENT_OTHER): Payer: Self-pay | Admitting: Cardiology

## 2023-01-24 ENCOUNTER — Ambulatory Visit (HOSPITAL_BASED_OUTPATIENT_CLINIC_OR_DEPARTMENT_OTHER): Payer: 59 | Admitting: Cardiology

## 2023-01-24 VITALS — BP 118/88 | HR 86 | Ht 66.0 in | Wt 265.1 lb

## 2023-01-24 DIAGNOSIS — I10 Essential (primary) hypertension: Secondary | ICD-10-CM | POA: Diagnosis not present

## 2023-01-24 DIAGNOSIS — Z09 Encounter for follow-up examination after completed treatment for conditions other than malignant neoplasm: Secondary | ICD-10-CM | POA: Diagnosis not present

## 2023-01-24 DIAGNOSIS — Z8673 Personal history of transient ischemic attack (TIA), and cerebral infarction without residual deficits: Secondary | ICD-10-CM

## 2023-01-24 DIAGNOSIS — E7800 Pure hypercholesterolemia, unspecified: Secondary | ICD-10-CM

## 2023-01-24 DIAGNOSIS — E1169 Type 2 diabetes mellitus with other specified complication: Secondary | ICD-10-CM

## 2023-01-24 DIAGNOSIS — Z7189 Other specified counseling: Secondary | ICD-10-CM

## 2023-01-24 NOTE — Patient Instructions (Signed)

## 2023-01-24 NOTE — Progress Notes (Signed)
Cardiology Office Note:  .   Date:  01/24/2023  ID:  Angela Abbott, DOB [birth year], MRN [id number] PCP: Assunta Found, MD  [facility] Providers Cardiologist:  Jodelle Red, MD {  History of Present Illness: .   Angela Abbott is a 63 y.o. female with a hx of hypertension, TIA/CVA with prior right CEA  who is seen for follow up today. I initially met her 09/28/19 as a new consult at the request of Assunta Found, MD for the evaluation and management of preoperative cardiovascular evaluation. Had been planned for bariatric surgery (prior lap band 2010, removed 2/2 reflux/dysphagia).   History of CAD/PAD/CVA/TIA: Has history of R CEA 2019 for carotid disease. Follows with Dr. Myra Gianotti. Noted to have prior embolic infarcts in the right brain on MRI   Quit smoking in 2019, was a smoker for >40 years.  Today: Reviewed episode that brought her to ER 01/04/23. She just felt off, woke up with upper abdominal fullness, wanted to make sure her heart was ok. ER workup reassuring, ECG no ischemia, hsTn normal.  Does have history of acid reflux, though this sensation was different than her normal.  Overall doing well. Tolerating moujnaro. Starting weight 303 lbs, now 265 lbs. Follows with Dr. Andrey Campanile, have been discussing gastric bypass. Had lap band in 2010, had to have it removed due to reflux.  ROS: Denies other chest pain, shortness of breath at rest or with normal exertion. No PND, orthopnea, LE edema or unexpected weight gain. No syncope or palpitations. ROS otherwise negative except as noted.   Studies Reviewed: Marland Kitchen    EKG:       Physical Exam:   VS:  BP 118/88 (BP Location: Left Arm, Patient Position: Sitting, Cuff Size: Normal)   Pulse 86   Ht 5\' 6"  (1.676 m)   Wt 265 lb 1.6 oz (120.2 kg)   SpO2 97%   BMI 42.79 kg/m    Wt Readings from Last 3 Encounters:  01/24/23 265 lb 1.6 oz (120.2 kg)  01/04/23 293 lb 3.4 oz (133 kg)  09/07/22 293 lb 3.4 oz (133 kg)    GEN:  Well nourished, well developed in no acute distress HEENT: Normal, moist mucous membranes NECK: No JVD CARDIAC: regular rhythm, normal S1 and S2, no rubs or gallops. No murmur. VASCULAR: Radial and DP pulses 2+ bilaterally. No carotid bruits RESPIRATORY:  Clear to auscultation without rales, wheezing or rhonchi  ABDOMEN: Soft, non-tender, non-distended MUSCULOSKELETAL:  Ambulates independently SKIN: Warm and dry, no edema NEUROLOGIC:  Alert and oriented x 3. No focal neuro deficits noted. PSYCHIATRIC:  Normal affect    ASSESSMENT AND PLAN: .    ER visit: reviewed, no further events, cardiac workup reassuring -reviewed red flag warning signs that need immediate medical attention  Hypertension: -systolic at goal, diastolic slightly elevated -continue diltiazem   Morbid obesity Type II diabetes -BMI 46->42. S/P lap band 2010 and then removal 2019. Had discussed Roux-en-Y surgery. On Mounjaro, tolerating -last A1c 6.4   Prior CVA/TIA, with history of carotid stenosis s/p R CEA Hypercholesterolemia -on clopidogrel -continue rosuvastatin and ezetimibe, LDL goal <55, last 38 per [facility] 12/2022. TG 107  CV risk counseling and prevention -recommend heart healthy/Mediterranean diet, with whole grains, fruits, vegetable, fish, lean meats, nuts, and olive oil. Limit salt. -recommend moderate walking, 3-5 times/week for 30-50 minutes each session. Aim for at least 150 minutes.week. Goal should be pace of 3 miles/hours, or walking 1.5 miles in 30 minutes -recommend avoidance  of tobacco products. Avoid excess alcohol.  Dispo: 1 year or sooner as needed  Signed, Jodelle Red, MD   Jodelle Red, MD, PhD, [facility] [place]  [facility]  [place]  [facility] at [facility] [address], [address] [place], [place] [address] [phone]

## 2023-02-04 ENCOUNTER — Other Ambulatory Visit (HOSPITAL_BASED_OUTPATIENT_CLINIC_OR_DEPARTMENT_OTHER): Payer: Self-pay

## 2023-02-04 ENCOUNTER — Ambulatory Visit (HOSPITAL_BASED_OUTPATIENT_CLINIC_OR_DEPARTMENT_OTHER): Payer: 59 | Admitting: Family

## 2023-02-04 MED ORDER — MOUNJARO 15 MG/0.5ML ~~LOC~~ SOAJ
15.0000 mg | SUBCUTANEOUS | 2 refills | Status: AC
  Filled 2023-02-04: qty 2, 28d supply, fill #0
  Filled 2023-03-31: qty 2, 28d supply, fill #1
  Filled 2023-05-28: qty 2, 28d supply, fill #2

## 2023-02-08 ENCOUNTER — Other Ambulatory Visit (HOSPITAL_BASED_OUTPATIENT_CLINIC_OR_DEPARTMENT_OTHER): Payer: Self-pay

## 2023-03-31 ENCOUNTER — Other Ambulatory Visit (HOSPITAL_BASED_OUTPATIENT_CLINIC_OR_DEPARTMENT_OTHER): Payer: Self-pay

## 2023-04-01 ENCOUNTER — Other Ambulatory Visit (HOSPITAL_BASED_OUTPATIENT_CLINIC_OR_DEPARTMENT_OTHER): Payer: Self-pay

## 2023-05-14 ENCOUNTER — Encounter: Payer: Self-pay | Admitting: Podiatrist

## 2023-05-14 ENCOUNTER — Ambulatory Visit (INDEPENDENT_AMBULATORY_CARE_PROVIDER_SITE_OTHER)

## 2023-05-14 ENCOUNTER — Ambulatory Visit: Admitting: Podiatrist

## 2023-05-14 DIAGNOSIS — M7752 Other enthesopathy of left foot: Secondary | ICD-10-CM

## 2023-05-14 DIAGNOSIS — M7672 Peroneal tendinitis, left leg: Secondary | ICD-10-CM

## 2023-05-14 NOTE — Progress Notes (Unsigned)
 Chief Complaint  Patient presents with   Plantar Fasciitis    RM#3  Left heel pain previous history of plantar fasciitis had surgery within the last 4 years no pain until the last 2 weeks no new injury.     HPI: Patient is 63 y.o. female who presents today for pain in the left heel. Angela Abbott relates it has been bothering her for about 2 weeks.  She had sugery for plantar fasciitis in the past and that is OK.  Now her pain is on the lateral side of the left foot.    Patient Active Problem List   Diagnosis Date Noted   Transient ischemic attack (TIA) 06/24/2021   Controlled type 2 diabetes mellitus without complication, without long-term current use of insulin  (HCC) 01/30/2021   Mixed diabetic hyperlipidemia associated with type 2 diabetes mellitus (HCC) 01/30/2021   Vitamin D deficiency 01/30/2021   Iron deficiency anemia secondary to inadequate dietary iron intake 01/30/2021   History of CVA (cerebrovascular accident) 11/14/2019   Obesity, Class III, BMI 40-49.9 (morbid obesity) 11/14/2019   Hyperlipidemia 08/19/2018   Essential hypertension 08/19/2018   Numbness 11/01/2017   TIA (transient ischemic attack) 11/01/2017   Sensory disturbance 11/01/2017   Acute ischemic stroke (HCC) 11/01/2017   Tobacco abuse 10/27/2017   Trigger middle finger of right hand 06/19/2017   Chest pain 02/14/2017   Multifocal pneumonia 02/14/2017   Acquired hypothyroidism 02/14/2017   GERD (gastroesophageal reflux disease) 02/14/2017   Pneumonia 02/14/2017   Cigarette smoker 11/13/2016   Chronic cough 11/12/2016   Mallet deformity of left ring finger 08/22/2015   Sciatica of left side 05/18/2013   Arthritis of wrist, left, degenerative 03/30/2013   History of laparoscopic adjustable gastric banding, 02/17/2008 04/29/2012   Morbid obesity (HCC) 01/16/2012   PLANTAR FACIITIS 07/01/2007   KNEE PAIN 04/29/2007   TEAR MEDIAL MENISCUS 04/29/2007    Current Outpatient Medications on File Prior to Visit   Medication Sig Dispense Refill   celecoxib (CELEBREX) 200 MG capsule Take 200 mg by mouth 2 (two) times daily.     Cholecalciferol (VITAMIN D) 125 MCG (5000 UT) CAPS Take 125 mcg by mouth daily.     citalopram (CELEXA) 10 MG tablet Take 10 mg by mouth daily.     clopidogrel  (PLAVIX ) 75 MG tablet Take 1 tablet (75 mg total) by mouth daily. 90 tablet 3   diltiazem  (CARDIZEM  CD) 180 MG 24 hr capsule Take 180 mg by mouth at bedtime.      docusate sodium  (COLACE) 100 MG capsule Take 100 mg by mouth in the morning.     ezetimibe (ZETIA) 10 MG tablet Take 10 mg by mouth daily.     Ferrous Sulfate Dried (HIGH POTENCY IRON) 65 MG TABS Take 1 tablet by mouth daily.     levothyroxine  (SYNTHROID ) 175 MCG tablet Take 175 mcg by mouth daily before breakfast.     metFORMIN  (GLUCOPHAGE ) 500 MG tablet 1 po qd at night. (Patient taking differently: Take 500 mg by mouth daily with breakfast.) 30 tablet 0   omeprazole  (PRILOSEC) 40 MG capsule Take 40 mg by mouth daily.     rosuvastatin (CRESTOR) 10 MG tablet Take 10 mg by mouth daily.     tirzepatide  (MOUNJARO ) 15 MG/0.5ML Pen Inject 15 mg into the skin once a week. 2 mL 2   tirzepatide  (MOUNJARO ) 15 MG/0.5ML Pen Inject 15 mg into the skin once a week. 2 mL 1   tirzepatide  (MOUNJARO ) 15 MG/0.5ML Pen Inject 15 mg  into the skin every 7 (seven) days. 2 mL 2   No current facility-administered medications on file prior to visit.    Allergies  Allergen Reactions   Bee Venom Shortness Of Breath and Swelling    Review of Systems No fevers, chills, nausea, muscle aches, no difficulty breathing, no calf pain, no chest pain or shortness of breath.   Physical Exam  GENERAL APPEARANCE: Alert, conversant. Appropriately groomed. No acute distress.   VASCULAR: Pedal pulses palpable 2/4 DP and 2/4 PT bilateral.  Capillary refill time is immediate to all digits,  Proximal to distal cooling is warm to warm.  Digital perfusion adequate.   NEUROLOGIC: sensation is  intact to 5.07 monofilament at 5/5 sites bilateral.  Light touch is intact bilateral, vibratory sensation intact bilateral  MUSCULOSKELETAL:pain plantar lateral left foot just proximal to the fifth metatarsal base and cuboid noted. Some pain along the peroneal tendon course noted as well.  Hammertoe contracture noted left   DERMATOLOGIC: skin is warm, supple, and dry.  Color, texture, and turgor of skin within normal limits.  No open wounds are noted.  No preulcerative lesions are seen.  Digital nails are asymptomatic.    Radiographic exam:  Normal osseous mineralization.  Small boney prominence on cuboid noted. No fracture or dislocation or acute osseous abnormalities present.  Hammertoe 2/3 noted,  otherwise joint spaces are normal.    Assessment     ICD-10-CM   1. Peroneal tendinitis of left lower extremity  M76.72 DG Foot Complete Left    2. Bursitis of left foot  M77.52        Plan  Discussed exam and xray findings.  Discussed peroneal tendonitis and treatment recommendations.  At todays visit I recommended an injectin to help with inflammation along the lateral foot.  Also recommended a good ankle brace to wear for 2 weeks.  She elected to proceed with the injection.  See below. She will see how she does with the injection and call if it fails to relieve her pain.    Procedure: Injection left foot  Discussed alternatives, risks, complications and verbal consent was obtained.  Location: left foot superficial to peroneal tendon  Skin Prep: Alcohol. Injectate: 1cc 0.5% marcaine  plain,3 mg dexamethasone  phosphate  Disposition: Patient tolerated procedure well. Injection site dressed with a band-aid.  Post-injection care was discussed and return precautions discussed.

## 2023-05-28 ENCOUNTER — Other Ambulatory Visit (HOSPITAL_BASED_OUTPATIENT_CLINIC_OR_DEPARTMENT_OTHER): Payer: Self-pay

## 2023-06-14 IMAGING — MR MR LUMBAR SPINE W/O CM
5 series · 31 of 48 positions shown · non-contrast
Comparison: MRI of the lumbar spine January 30, 2004.

CLINICAL DATA: Lumbar radiculitis 9O8.UZ (8PF-QE-CM). Other
intervertebral disc degeneration, lumbar region GE0.QT (8PF-QE-CM).

EXAM:
MRI LUMBAR SPINE WITHOUT CONTRAST
TECHNIQUE: Multiplanar, multisequence MR imaging of the lumbar spine was
performed. No intravenous contrast was administered.

[Series 5: T2 · sagittal · 4.0mm · 0.68mm/px · 6 of 18 slices shown (1 of 2)]
[im 1/18]
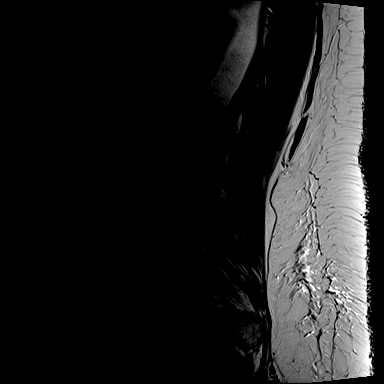
[im 4/18]
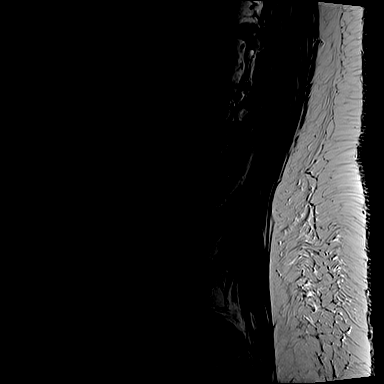
[im 7/18]
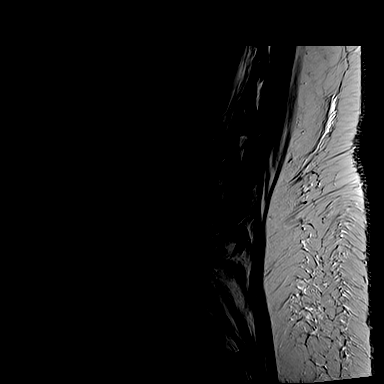
[im 11/18]
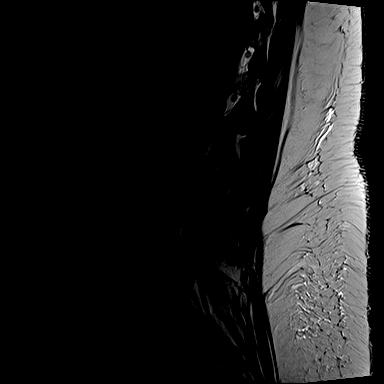
[im 14/18]
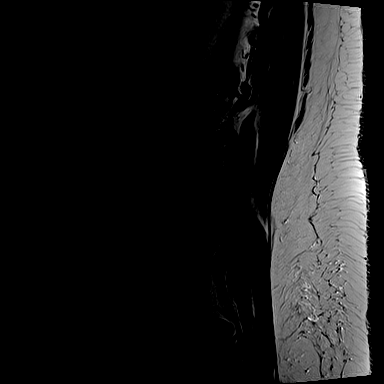
[im 18/18]
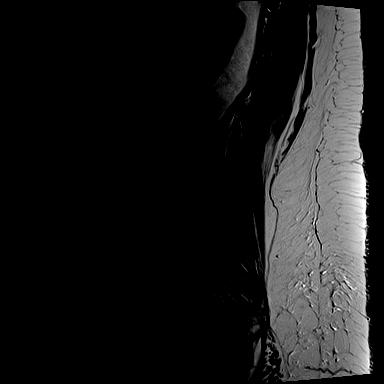

[Series 6: T1 · sagittal · 4.0mm · 0.81mm/px · 7 of 18 slices shown (1 of 2)]
[im 1/18]
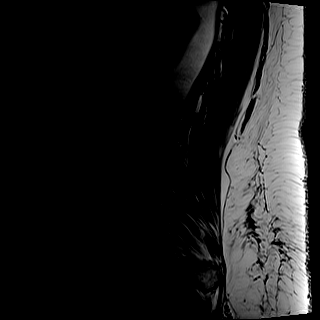
[im 3/18]
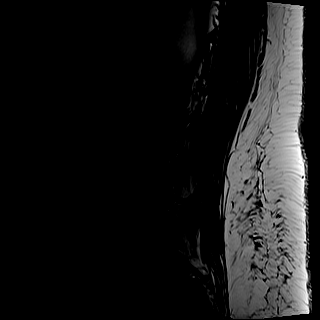
[im 6/18]
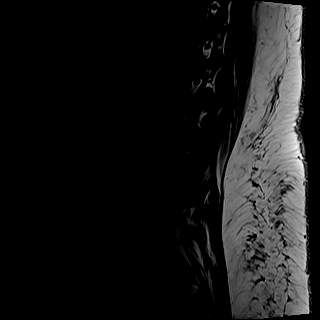
[im 9/18]
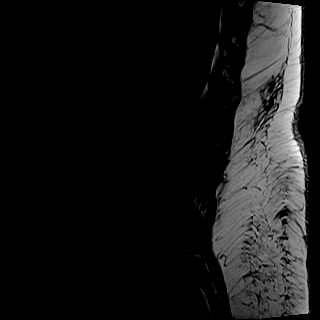
[im 12/18]
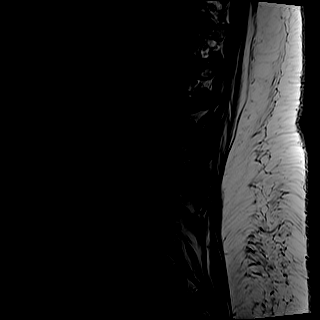
[im 15/18]
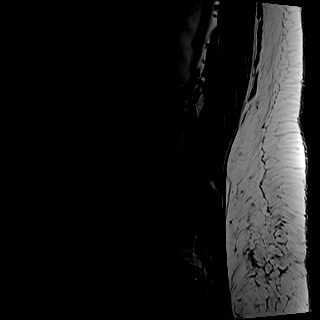
[im 18/18]
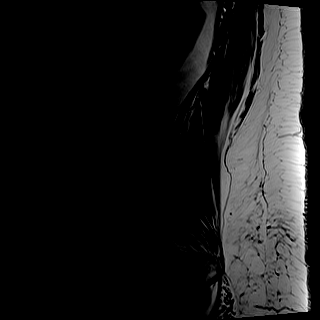

[Series 7: STIR · sagittal · 4.0mm · 0.51mm/px · 2 of 18 slices shown]
[im 1/18]
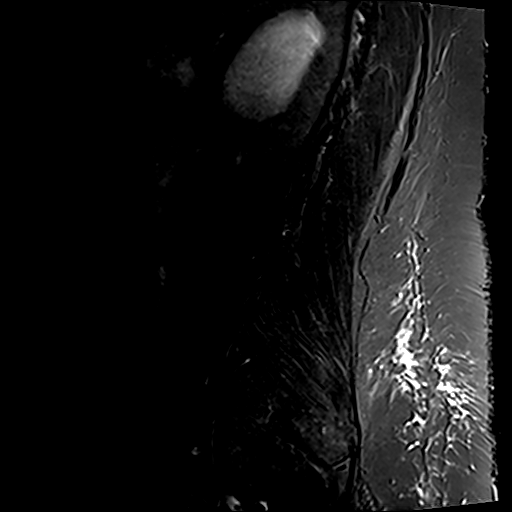
[im 3/18]
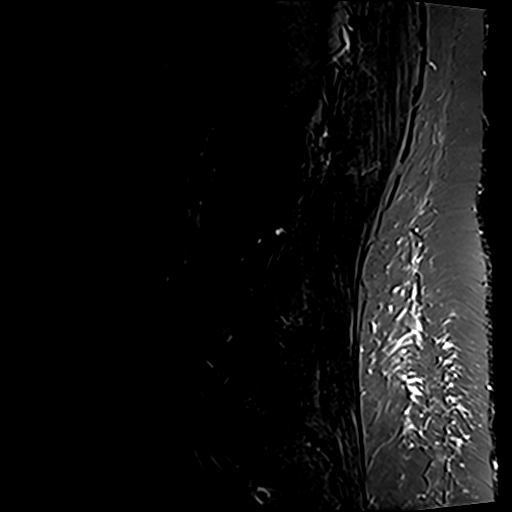

[Series 8: T2 · axial · 4.0mm · 0.70mm/px · z∈[-64,+116]mm · 8 of 35 slices shown (2 of 2)]
[im 1/35]
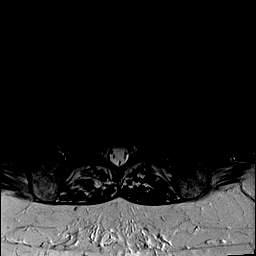
[im 6/35]
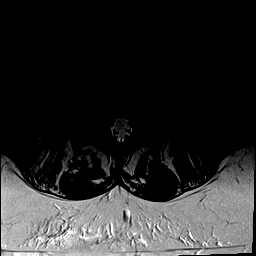
[im 11/35]
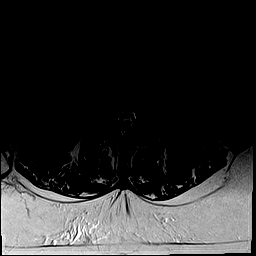
[im 16/35]
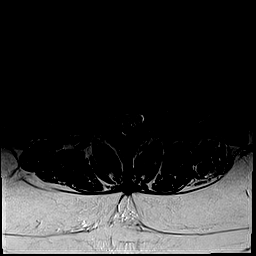
[im 19/35]
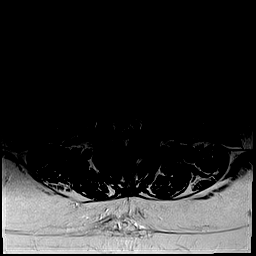
[im 24/35]
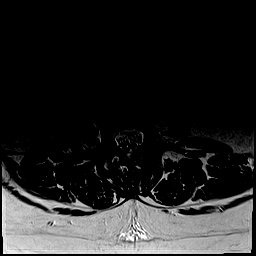
[im 29/35]
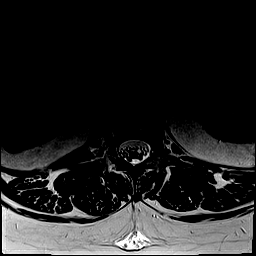
[im 35/35]
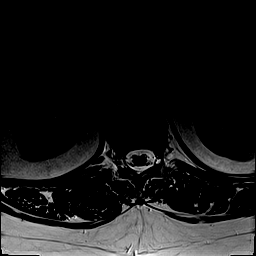

[Series 9: T1 · axial · 4.0mm · 0.35mm/px · z∈[-64,+116]mm · 8 of 35 slices shown (2 of 2)]
[im 1/35]
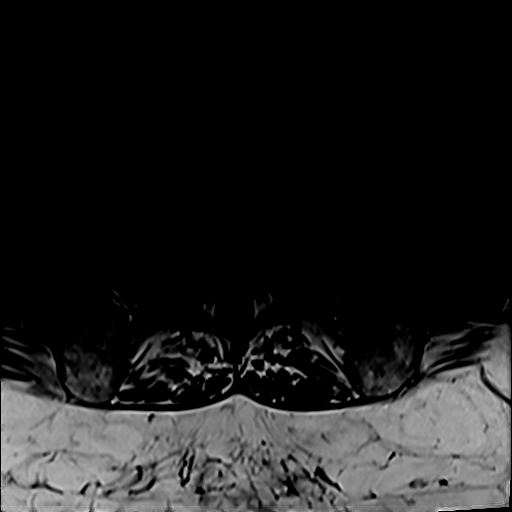
[im 6/35]
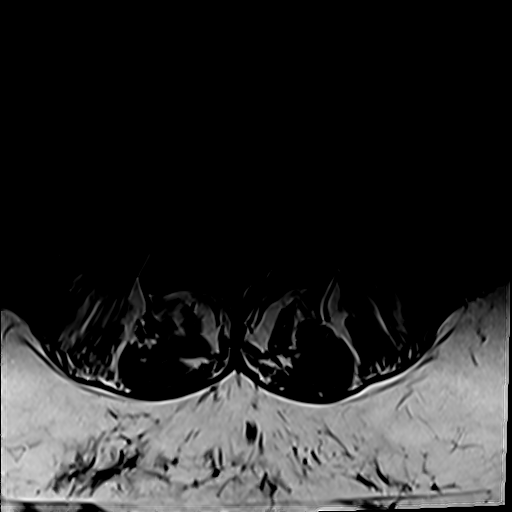
[im 11/35]
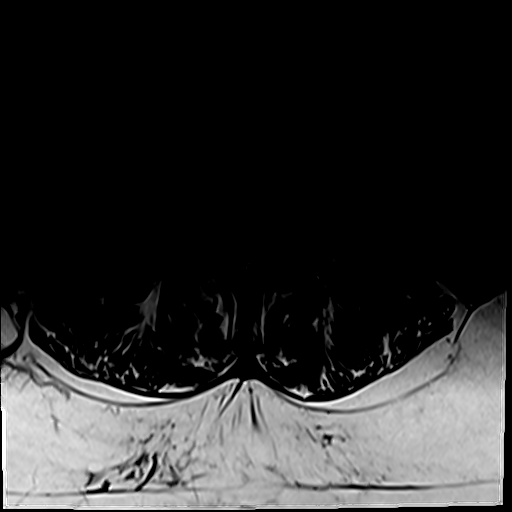
[im 16/35]
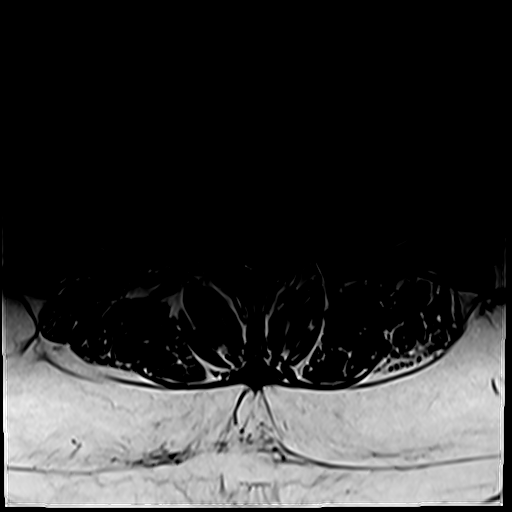
[im 19/35]
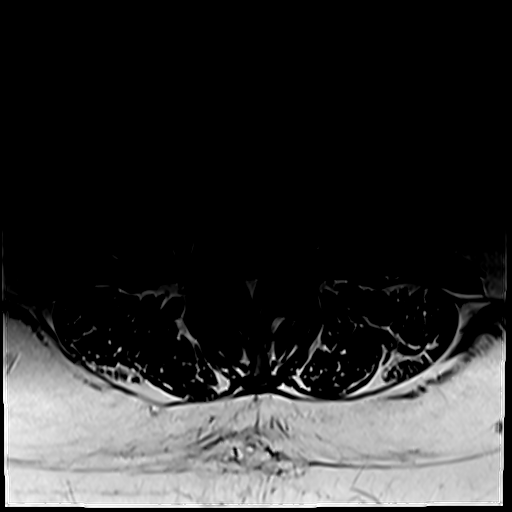
[im 24/35]
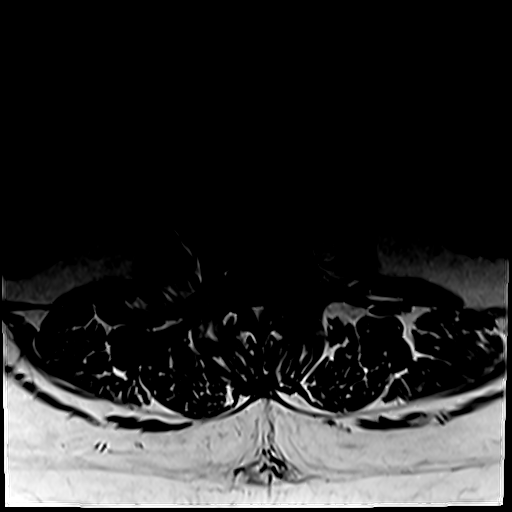
[im 29/35]
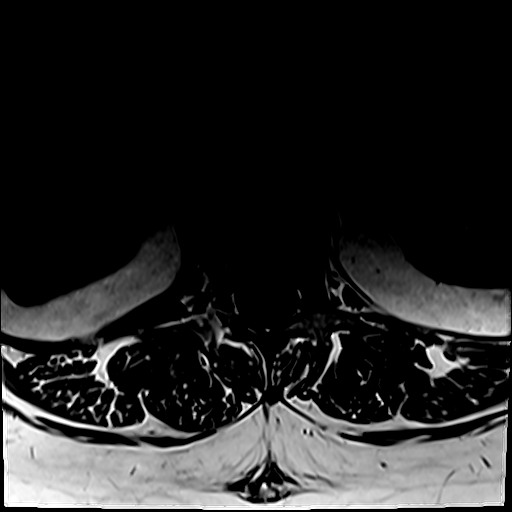
[im 35/35]
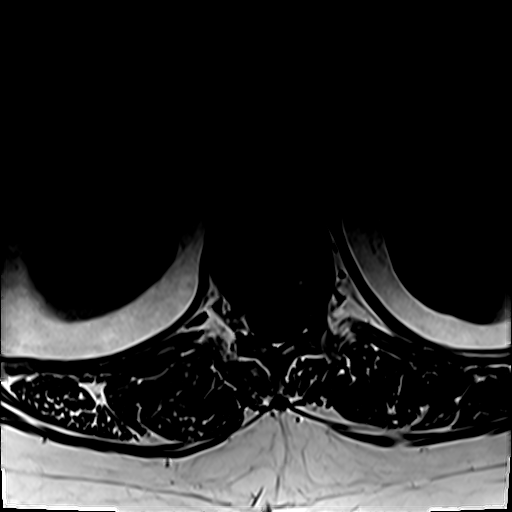

[31 of 48 positions shown; findings below may reference images not displayed]

FINDINGS: Segmentation:  Standard.

Alignment:  Physiologic.

Vertebrae: No fracture, evidence of discitis, or bone lesion. End
plate degenerative changes throughout the lumbar spine.

Conus medullaris and cauda equina: Conus extends to the L1-2 level.
Conus and cauda equina appear normal.

Paraspinal and other soft tissues: Negative.

Disc levels:

T12-L1: Mild facet degenerative changes. No spinal or neural
stenosis.

L1-2: Loss of disc height, disc bulge and mild facet degenerative
changes without significant spinal canal or neural foraminal
stenosis.

L2-3: Loss of disc height and mild facet degenerative changes
resulting in mild bilateral neural foraminal narrowing. No spinal
canal stenosis.

L3-4: Mild loss of disc height and mild facet degenerative changes
resulting in mild bilateral neural foraminal narrowing. No spinal
canal stenosis.

L4-5: Mild loss of disc height, disc bulge, moderate facet
degenerative changes and ligamentum flavum redundancy resulting in
mild narrowing of the bilateral subarticular zones, moderate right
and mild left neural foraminal narrowing.

L5-S1: Loss of disc height, right asymmetric disc bulge and moderate
facet degenerative changes resulting in moderate bilateral neural
foraminal narrowing. No significant spinal canal stenosis.

Significant progression of degenerative disc disease and facet
arthropathy when compared to prior MRI.
IMPRESSION: 1. Degenerative changes of the lumbar spine with moderate right
neural foraminal narrowing at L4-5 and bilaterally at L5-S1.
2. No high-grade spinal canal stenosis at any level.

## 2023-07-01 ENCOUNTER — Other Ambulatory Visit: Payer: Self-pay | Admitting: Obstetrics and Gynecology

## 2023-07-31 ENCOUNTER — Other Ambulatory Visit (HOSPITAL_BASED_OUTPATIENT_CLINIC_OR_DEPARTMENT_OTHER): Payer: Self-pay

## 2023-07-31 ENCOUNTER — Other Ambulatory Visit: Payer: Self-pay

## 2023-07-31 MED ORDER — MOUNJARO 15 MG/0.5ML ~~LOC~~ SOAJ
15.0000 mg | SUBCUTANEOUS | 2 refills | Status: AC
  Filled 2023-07-31 – 2023-10-04 (×2): qty 2, 28d supply, fill #0
  Filled 2023-10-30: qty 2, 28d supply, fill #1

## 2023-09-04 ENCOUNTER — Other Ambulatory Visit: Payer: Self-pay

## 2023-09-04 ENCOUNTER — Encounter (HOSPITAL_COMMUNITY): Payer: Self-pay

## 2023-09-04 ENCOUNTER — Emergency Department (HOSPITAL_COMMUNITY)
Admission: EM | Admit: 2023-09-04 | Discharge: 2023-09-04 | Disposition: A | Discharge status: Home / Self Care | Attending: Emergency Medicine | Admitting: Emergency Medicine

## 2023-09-04 DIAGNOSIS — Z79899 Other long term (current) drug therapy: Secondary | ICD-10-CM | POA: Diagnosis not present

## 2023-09-04 DIAGNOSIS — M62830 Muscle spasm of back: Secondary | ICD-10-CM

## 2023-09-04 DIAGNOSIS — Z7902 Long term (current) use of antithrombotics/antiplatelets: Secondary | ICD-10-CM | POA: Diagnosis not present

## 2023-09-04 DIAGNOSIS — M62838 Other muscle spasm: Secondary | ICD-10-CM | POA: Diagnosis not present

## 2023-09-04 DIAGNOSIS — Z7984 Long term (current) use of oral hypoglycemic drugs: Secondary | ICD-10-CM | POA: Insufficient documentation

## 2023-09-04 DIAGNOSIS — M542 Cervicalgia: Secondary | ICD-10-CM | POA: Diagnosis present

## 2023-09-04 MED ORDER — DIAZEPAM 2 MG PO TABS
2.0000 mg | ORAL_TABLET | Freq: Once | ORAL | Status: AC
  Administered 2023-09-04: 2 mg via ORAL
  Filled 2023-09-04: qty 1

## 2023-09-04 MED ORDER — METHOCARBAMOL 500 MG PO TABS: 500.0000 mg | ORAL_TABLET | Freq: Two times a day (BID) | ORAL | 0 refills | Status: AC

## 2023-09-04 MED ORDER — LIDOCAINE 5 % EX PTCH: 1.0000 | MEDICATED_PATCH | CUTANEOUS | 0 refills | Status: AC

## 2023-09-04 MED ORDER — NAPROXEN 500 MG PO TABS: 500.0000 mg | ORAL_TABLET | Freq: Two times a day (BID) | ORAL | 0 refills | Status: AC

## 2023-09-04 MED ORDER — KETOROLAC TROMETHAMINE 60 MG/2ML IM SOLN
30.0000 mg | Freq: Once | INTRAMUSCULAR | Status: AC
  Administered 2023-09-04: 30 mg via INTRAMUSCULAR
  Filled 2023-09-04: qty 2

## 2023-09-04 NOTE — ED Triage Notes (Signed)
 POV from home. Cc of neck pain since Saturday, says the pain has moved up to bottom of her head.  Hurts worse with movement.  Has appointment today with pcp for the same.  10/10 pain  stabbing

## 2023-09-04 NOTE — ED Provider Notes (Signed)
 [facility] AT [facility]  Provider Note  CSN: [id number] Arrival date & time: 09/04/23 0215  History Chief Complaint  Patient presents with   Torticollis    Angela Abbott is a 63 y.o. female here with son for evaluation of 3-4 days of worsening L neck pain, radiating into the base of her skull and worse with movement. No falls or injuries. No fever, cough or URI. She has tried motrin without improvement.    Home Medications Prior to Admission medications   Medication Sig Start Date End Date Taking? Authorizing Provider  lidocaine  (LIDODERM ) 5 % Place 1 patch onto the skin daily. Remove & Discard patch within 12 hours or as directed by MD 09/04/23  Yes Roselyn Carlin NOVAK, MD  methocarbamol  (ROBAXIN ) 500 MG tablet Take 1 tablet (500 mg total) by mouth 2 (two) times daily. 09/04/23  Yes Roselyn Carlin NOVAK, MD  naproxen  (NAPROSYN ) 500 MG tablet Take 1 tablet (500 mg total) by mouth 2 (two) times daily. 09/04/23  Yes Roselyn Carlin NOVAK, MD  Cholecalciferol (VITAMIN D) 125 MCG (5000 UT) CAPS Take 125 mcg by mouth daily.    [provider]  citalopram (CELEXA) 10 MG tablet Take 10 mg by mouth daily. 02/11/19   [provider]  clopidogrel  (PLAVIX ) 75 MG tablet Take 1 tablet (75 mg total) by mouth daily. 06/26/21   Evonnie Lenis, MD  diltiazem  (CARDIZEM  CD) 180 MG 24 hr capsule Take 180 mg by mouth at bedtime.  10/31/17   [provider]  docusate sodium  (COLACE) 100 MG capsule Take 100 mg by mouth in the morning.    [provider]  ezetimibe (ZETIA) 10 MG tablet Take 10 mg by mouth daily. 06/05/21   [provider]  Ferrous Sulfate Dried (HIGH POTENCY IRON) 65 MG TABS Take 1 tablet by mouth daily.    [provider]  GEMTESA  75 MG TABS Take 1 tablet (75 mg total) by mouth daily. 07/02/23   Zuleta, Kaitlin G, NP  levothyroxine  (SYNTHROID ) 175 MCG tablet Take 175 mcg by mouth daily before breakfast.    [provider]  metFORMIN  (GLUCOPHAGE ) 500 MG tablet 1 po qd at night. Patient taking differently: Take 500 mg by mouth daily with breakfast. 02/22/21   Delores Shields A, DO  omeprazole  (PRILOSEC) 40 MG capsule Take 40 mg by mouth daily.    [provider]  rosuvastatin (CRESTOR) 10 MG tablet Take 10 mg by mouth daily. 10/07/22   [provider]  tirzepatide  (MOUNJARO ) 15 MG/0.5ML Pen Inject 15 mg into the skin once a week. 12/02/22     tirzepatide  (MOUNJARO ) 15 MG/0.5ML Pen Inject 15 mg into the skin once a week. 01/10/23     tirzepatide  (MOUNJARO ) 15 MG/0.5ML Pen Inject 15 mg into the skin every 7 (seven) days. 02/04/23     tirzepatide  (MOUNJARO ) 15 MG/0.5ML Pen Inject 15 mg into the skin once a week. 07/31/23        Allergies    Bee venom   Review of Systems   Review of Systems Please see HPI for pertinent positives and negatives  Physical Exam BP (!) 156/64   Pulse 84   Temp 97.6 F (36.4 C) (Oral)   Resp 18   Ht 5' 6 (1.676 m)   Wt 114.8 kg   SpO2 93%   BMI 40.84 kg/m   Physical Exam Vitals and nursing note reviewed.  Constitutional:      Appearance: Normal  appearance.  HENT:     Head: Normocephalic and atraumatic.     Nose: Nose normal.     Mouth/Throat:     Mouth: Mucous membranes are moist.  Eyes:     Extraocular Movements: Extraocular movements intact.     Conjunctiva/sclera: Conjunctivae normal.  Cardiovascular:     Rate and Rhythm: Normal rate.  Pulmonary:     Effort: Pulmonary effort is normal.     Breath sounds: Normal breath sounds.  Abdominal:     General: Abdomen is flat.     Palpations: Abdomen is soft.     Tenderness: There is no abdominal tenderness.  Musculoskeletal:        General: Tenderness (L trapezius muscle) present. No swelling. Normal range of motion.     Cervical back: Neck supple. No rigidity.  Skin:    General: Skin is warm and dry.  Neurological:     General: No focal deficit present.     Mental Status: She is  alert and oriented to person, place, and time.     Cranial Nerves: No cranial nerve deficit.     Sensory: No sensory deficit.     Motor: No weakness.  Psychiatric:        Mood and Affect: Mood normal.     ED Results / Procedures / Treatments   EKG None  Procedures Procedures  Medications Ordered in the ED Medications  ketorolac  (TORADOL ) injection 30 mg (30 mg Intramuscular Given 09/04/23 0246)  diazepam  (VALIUM ) tablet 2 mg (2 mg Oral Given 09/04/23 0246)    Initial Impression and Plan  Patient here with MSK L neck pain, trapezius spasm. Will give IM toradol , oral diazepam  and reassess for improvement.   ED Course   Clinical Course as of 09/04/23 0336  Thu Sep 04, 2023  [id number] Patient reports she is feeling better and ready to go home. Recommend heat, rest, massage. Rx for naprosyn , robaxin  and lidoderm . PCP follow up, RTED for any other concerns.   [CS]    Clinical Course User Index [CS] Roselyn Carlin NOVAK, MD     MDM Rules/Calculators/A&P Medical Decision Making Problems Addressed: Spasm of left trapezius muscle: acute illness or injury  Risk Prescription drug management.     Final Clinical Impression(s) / ED Diagnoses Final diagnoses:  Spasm of left trapezius muscle    Rx / DC Orders ED Discharge Orders          Ordered    naproxen  (NAPROSYN ) 500 MG tablet  2 times daily        09/04/23 0336    methocarbamol  (ROBAXIN ) 500 MG tablet  2 times daily        09/04/23 0336    lidocaine  (LIDODERM ) 5 %  Every 24 hours        09/04/23 0336             Roselyn Carlin NOVAK, MD 09/04/23 [phone]

## 2023-09-04 NOTE — ED Notes (Signed)
 ED Provider at bedside.

## 2023-10-04 ENCOUNTER — Other Ambulatory Visit (HOSPITAL_BASED_OUTPATIENT_CLINIC_OR_DEPARTMENT_OTHER): Payer: Self-pay

## 2023-10-04 ENCOUNTER — Other Ambulatory Visit (HOSPITAL_COMMUNITY): Payer: Self-pay

## 2023-10-13 ENCOUNTER — Ambulatory Visit: Payer: 59 | Admitting: Urology

## 2023-10-13 VITALS — BP 138/86 | HR 91

## 2023-10-13 DIAGNOSIS — R3129 Other microscopic hematuria: Secondary | ICD-10-CM

## 2023-10-13 DIAGNOSIS — R350 Frequency of micturition: Secondary | ICD-10-CM

## 2023-10-13 LAB — URINALYSIS, ROUTINE W REFLEX MICROSCOPIC
Bilirubin, UA: NEGATIVE
Glucose, UA: NEGATIVE
Leukocytes,UA: NEGATIVE
Nitrite, UA: NEGATIVE
Protein,UA: NEGATIVE
Specific Gravity, UA: 1.025 (ref 1.005–1.030)
Urobilinogen, Ur: 0.2 mg/dL (ref 0.2–1.0)
pH, UA: 5.5 (ref 5.0–7.5)

## 2023-10-13 LAB — MICROSCOPIC EXAMINATION: Bacteria, UA: NONE SEEN

## 2023-10-13 NOTE — Progress Notes (Signed)
 10/13/2023 9:48 AM   Angela Abbott 05/24/1960 [id number]  Referring provider: Marvine Rush, MD [address] [place],  [place] [address]  No chief complaint on file.   HPI:  F/u -   1) left ureteral stone-patient had left flank and Aug 2024 CT revealed a 1 to 2 mm left UPJ/proximal ureteral stone with mild left hydronephrosis. No renal stones. UA greater than 50 red blood cells. Flank pain resolved. F/u Renal US  10/18/2022 revealed no stone, mass or hydronephrosis.    2) MH - UA with > 50 rbc during a stone passage even but persistent 3-10 rbc after stone passage. She is a smoker. No gross hematuria. Oct 2024 cystoscopy benign.   3) urinary frequency, incontinence  - saw Uro Gyn in [place] for a sling. She started Gemtesa . Gemtesa  helped.    Today, seen for the above. F/u She has had no further flank pain. No gross hematuria. UA with 3-10 rbc today. On Gemtesa . Noc x 1 - improved. Drinks more water. Lost 50 lbs - better diet and needs knee replacement.     PMH: Past Medical History:  Diagnosis Date   Arthritis    shoulders, knees, hips   Back pain    Bilateral swelling of feet and ankles    Carotid artery occlusion    Dental crowns present    GERD (gastroesophageal reflux disease)    Hypertension    Hypothyroidism    Knee pain    right knee   Left knee pain 07/2014   Osteoarthritis    Other fatigue    Pneumonia    hx of 02/2017    Right knee pain    Shortness of breath on exertion    Stress incontinence    Stroke [facility])    speech difficulty recalling words   Wears partial dentures    upper and lower    Surgical History: Past Surgical History:  Procedure Laterality Date   ABDOMINAL HYSTERECTOMY     partial   CAROTID ENDARTERECTOMY     CESAREAN SECTION  1995, 1998   ENDARTERECTOMY Right 11/05/2017   Procedure: ENDARTERECTOMY CAROTID RIGHT;  Surgeon: Serene Gaile ORN, MD;  Location: [facility];  Service: Vascular;  Laterality: Right;   ESOPHAGOGASTRODUODENOSCOPY  N/A 11/26/2012   Procedure: ESOPHAGOGASTRODUODENOSCOPY (EGD);  Surgeon: Alm VEAR Angle, MD;  Location: [facility];  Service: General;  Laterality: N/A;   GANGLION CYST EXCISION Right 02/2008   foot   KNEE ARTHROSCOPY WITH LATERAL MENISECTOMY Left 07/21/2014   Procedure: LEFT KNEE ARTHROSCOPY,  CHONDROPLASTY, WITH LATERAL AND MEDIAL  MENISCECTOMIES;  Surgeon: Toribio Chancy, MD;  Location: [facility];  Service: Orthopedics;  Laterality: Left;   KNEE ARTHROSCOPY WITH MEDIAL MENISECTOMY Left 07/21/2014   Procedure: KNEE ARTHROSCOPY WITH MEDIAL MENISECTOMY;  Surgeon: Toribio Chancy, MD;  Location: [facility];  Service: Orthopedics;  Laterality: Left;   LAPAROSCOPIC GASTRIC BANDING  02/18/2008   LAPAROSCOPIC GASTRIC BANDING N/A 2010   LAPAROSCOPIC REPAIR AND REMOVAL OF GASTRIC BAND N/A 03/21/2017   Procedure: LAPAROSCOPIC REPAIR AND REMOVAL OF GASTRIC BAND;  Surgeon: Tanda Locus, MD;  Location: [facility];  Service: General;  Laterality: N/A;   PARTIAL HYSTERECTOMY  09/07/2002   PATCH ANGIOPLASTY Right 11/05/2017   Procedure: PATCH ANGIOPLASTY of right carotid artery using xenosure bovine pericardium patch;  Surgeon: Serene Gaile ORN, MD;  Location: [facility];  Service: Vascular;  Laterality: Right;   SHOULDER ARTHROSCOPY WITH SUBACROMIAL DECOMPRESSION, ROTATOR CUFF REPAIR AND BICEP  TENDON REPAIR Right 11/23/2015   Procedure: RIGHT SHOULDER ARTHROSCOPY WITH DEBRIDEMENT, SUBACROMIAL DECOMPRESSION, DISTAL CLAVICLE EXCISION, ROTATOR CUFF REPAIR AND BICEP TENODESIS;  Surgeon: Toribio JULIANNA Chancy, MD;  Location: [facility];  Service: Orthopedics;  Laterality: Right;    Home Medications:  Allergies as of 10/13/2023       Reactions   Bee Venom Shortness Of Breath, Swelling        Medication List        Accurate as of October 13, 2023  9:48 AM. If you have any questions, ask your nurse or doctor.          citalopram 10 MG tablet Commonly known as:  CELEXA Take 10 mg by mouth daily.   clopidogrel  75 MG tablet Commonly known as: PLAVIX  Take 1 tablet (75 mg total) by mouth daily.   diltiazem  180 MG 24 hr capsule Commonly known as: CARDIZEM  CD Take 180 mg by mouth at bedtime.   docusate sodium  100 MG capsule Commonly known as: COLACE Take 100 mg by mouth in the morning.   ezetimibe 10 MG tablet Commonly known as: ZETIA Take 10 mg by mouth daily.   Gemtesa  75 MG Tabs Generic drug: Vibegron  Take 1 tablet (75 mg total) by mouth daily.   High Potency Iron 65 MG Tabs Take 1 tablet by mouth daily.   lidocaine  5 % Commonly known as: Lidoderm  Place 1 patch onto the skin daily. Remove & Discard patch within 12 hours or as directed by MD   metFORMIN  500 MG tablet Commonly known as: GLUCOPHAGE  1 po qd at night.   methocarbamol  500 MG tablet Commonly known as: ROBAXIN  Take 1 tablet (500 mg total) by mouth 2 (two) times daily.   Mounjaro  15 MG/0.5ML Pen Generic drug: tirzepatide  Inject 15 mg into the skin once a week.   Mounjaro  15 MG/0.5ML Pen Generic drug: tirzepatide  Inject 15 mg into the skin once a week.   Mounjaro  15 MG/0.5ML Pen Generic drug: tirzepatide  Inject 15 mg into the skin every 7 (seven) days.   Mounjaro  15 MG/0.5ML Pen Generic drug: tirzepatide  Inject 15 mg into the skin once a week.   naproxen  500 MG tablet Commonly known as: NAPROSYN  Take 1 tablet (500 mg total) by mouth 2 (two) times daily.   omeprazole  40 MG capsule Commonly known as: PRILOSEC Take 40 mg by mouth daily.   rosuvastatin 10 MG tablet Commonly known as: CRESTOR Take 10 mg by mouth daily.   Synthroid  175 MCG tablet Generic drug: levothyroxine  Take 175 mcg by mouth daily before breakfast.   Vitamin D 125 MCG (5000 UT) Caps Take 125 mcg by mouth daily.        Allergies:  Allergies  Allergen Reactions   Bee Venom Shortness Of Breath and Swelling    Family History: Family History  Problem Relation Age of Onset    CVA Mother    Hypertension Father    CVA Other    Diabetes Other    CAD Other    Cancer Neg Hx     Social History:  reports that she has been smoking cigarettes. She started smoking about 51 years ago. She has a 49 pack-year smoking history. She has never used smokeless tobacco. She reports current alcohol use. She reports that she does not use drugs.   Physical Exam: BP 138/86   Pulse 91   Constitutional:  Alert and oriented, No acute distress. HEENT: [place] AT, moist mucus membranes.  Trachea midline, no masses. Cardiovascular:  No clubbing, cyanosis, or edema. Respiratory: Normal respiratory effort, no increased work of breathing. GI: Abdomen is soft, nontender, nondistended, no abdominal masses GU: No CVA tenderness Skin: No rashes, bruises or suspicious lesions. Neurologic: Grossly intact, no focal deficits, moving all 4 extremities. Psychiatric: Normal mood and affect.  Laboratory Data: Lab Results  Component Value Date   WBC 8.3 01/04/2023   HGB 13.6 01/04/2023   HCT 42.0 01/04/2023   MCV 90.7 01/04/2023   PLT 279 01/04/2023    Lab Results  Component Value Date   CREATININE 0.73 01/04/2023    No results found for: PSA  No results found for: TESTOSTERONE  Lab Results  Component Value Date   HGBA1C 6.4 (H) 01/04/2023    Urinalysis    Component Value Date/Time   COLORURINE YELLOW 09/07/2022 0325   APPEARANCEUR Clear 10/14/2022 0957   LABSPEC 1.024 09/07/2022 0325   PHURINE 5.0 09/07/2022 0325   GLUCOSEU Negative 10/14/2022 0957   HGBUR LARGE (A) 09/07/2022 0325   BILIRUBINUR NEGATIVE 10/28/2022 1205   BILIRUBINUR Negative 10/14/2022 0957   KETONESUR NEGATIVE 09/07/2022 0325   PROTEINUR Positive (A) 10/28/2022 1205   PROTEINUR Negative 10/14/2022 0957   PROTEINUR 100 (A) 09/07/2022 0325   UROBILINOGEN negative (A) 10/28/2022 1205   UROBILINOGEN 0.2 07/07/2013 1432   NITRITE NEGATIVE 10/28/2022 1205   NITRITE Negative 10/14/2022 0957   NITRITE  NEGATIVE 09/07/2022 0325   LEUKOCYTESUR Negative 10/28/2022 1205   LEUKOCYTESUR Negative 10/14/2022 0957   LEUKOCYTESUR TRACE (A) 09/07/2022 0325    Lab Results  Component Value Date   LABMICR See below: 10/14/2022   WBCUA 0-5 10/14/2022   LABEPIT 0-10 10/14/2022   BACTERIA Few (A) 10/14/2022    Pertinent Imaging:  Results for orders placed during the hospital encounter of 10/18/22  US  RENAL  Narrative CLINICAL DATA:  Follow-up renal stones  EXAM: RENAL / URINARY TRACT ULTRASOUND COMPLETE  COMPARISON:  CT renal 09/07/2022  FINDINGS: Right Kidney:  Renal measurements: 11.0 x 4.2 x 6.1 cm = volume: 147.6 mL. Echogenicity within normal limits. No mass or hydronephrosis visualized.  Left Kidney:  Renal measurements: 11.9 x 5.7 x 5.0 cm = volume: 175.6 mL. Echogenicity within normal limits. No mass or hydronephrosis visualized.  Bladder:  Appears normal for degree of bladder distention.  Other:  None.  IMPRESSION: No hydronephrosis.   Electronically Signed By: Bard Moats M.D. On: 10/18/2022 14:54  No results found for this or any previous visit.  No results found for this or any previous visit.  Results for orders placed during the hospital encounter of 09/07/22  CT Renal Stone Study  Narrative CLINICAL DATA:  Recurrent left-sided flank pain.  EXAM: CT ABDOMEN AND PELVIS WITHOUT CONTRAST  TECHNIQUE: Multidetector CT imaging of the abdomen and pelvis was performed following the standard protocol without IV contrast.  RADIATION DOSE REDUCTION: This exam was performed according to the departmental dose-optimization program which includes automated exposure control, adjustment of the mA and/or kV according to patient size and/or use of iterative reconstruction technique.  COMPARISON:  07/15/2022.  FINDINGS: Lower chest: The heart is enlarged. Atelectasis is noted at the lung bases bilaterally.  Hepatobiliary: No focal liver abnormality is  seen. No gallstones, gallbladder wall thickening, or biliary dilatation.  Pancreas: Unremarkable. No pancreatic ductal dilatation or surrounding inflammatory changes.  Spleen: Normal in size without focal abnormality.  Adrenals/Urinary Tract: The adrenal glands are within normal limits. Nonobstructive renal calculi are noted on the right. There is mild hydronephrosis on the  left with a 2 mm calculus at the ureteropelvic junction. No obstructive uropathy on the right. The bladder is unremarkable.  Stomach/Bowel: Small hiatal hernia is noted. Stomach is within normal limits. Appendix appears normal. No evidence of bowel wall thickening, distention, or inflammatory changes. No free air or pneumatosis. Scattered diverticula are present along the sigmoid colon without evidence of diverticulitis.  Vascular/Lymphatic: Aortic atherosclerosis. No enlarged abdominal or pelvic lymph nodes.  Reproductive: Status post hysterectomy. No adnexal masses.  Other: No abdominopelvic ascites. A small fat containing umbilical hernia is present.  Musculoskeletal: Degenerative changes are present in the thoracolumbar spine. No acute osseous abnormality.  IMPRESSION: 1. Mild obstructive uropathy on the left with a 2 mm calculus at the ureteropelvic junction. 2. Nonobstructive right renal calculi. 3. Small hiatal hernia. 4. Sigmoid diverticulosis without diverticulitis. 5. Aortic atherosclerosis.   Electronically Signed By: Leita Birmingham M.D. On: 09/07/2022 04:57  Reviewed ct images   Assessment & Plan:    1. Microhematuria (Primary) Stable, prior negative work-up - no worrisome symptoms. Discussed repeat w/u. Will follow.  - Urinalysis, Routine w reflex microscopic; Future - Urinalysis, Routine w reflex microscopic  2. Frequency - stable on gemtesa . Drinking more water and lemon juice.   No follow-ups on file.  Donnice Brooks, MD  [facility]  [address] [place], [place] [address] [phone]

## 2023-10-30 ENCOUNTER — Other Ambulatory Visit (HOSPITAL_BASED_OUTPATIENT_CLINIC_OR_DEPARTMENT_OTHER): Payer: Self-pay

## 2023-10-30 ENCOUNTER — Other Ambulatory Visit: Payer: Self-pay

## 2023-10-30 MED ORDER — MOUNJARO 15 MG/0.5ML ~~LOC~~ SOAJ
15.0000 mg | SUBCUTANEOUS | 2 refills | Status: AC
  Filled 2023-10-30 – 2023-11-29 (×2): qty 2, 28d supply, fill #0
  Filled 2024-01-16: qty 2, 28d supply, fill #1

## 2023-11-29 ENCOUNTER — Other Ambulatory Visit (HOSPITAL_BASED_OUTPATIENT_CLINIC_OR_DEPARTMENT_OTHER): Payer: Self-pay

## 2023-11-29 ENCOUNTER — Other Ambulatory Visit (HOSPITAL_COMMUNITY): Payer: Self-pay

## 2023-12-17 ENCOUNTER — Encounter (HOSPITAL_BASED_OUTPATIENT_CLINIC_OR_DEPARTMENT_OTHER): Payer: Self-pay | Admitting: Cardiology

## 2024-10-11 ENCOUNTER — Ambulatory Visit: Admitting: Urology
